# Patient Record
Sex: Female | Born: 1945 | Race: White | Hispanic: No | Marital: Single | Smoking: Never smoker
Health system: Southern US, Community
[De-identification: ages and names within clinical notes are randomized; demographics above are authoritative.]

## PROBLEM LIST (undated history)

## (undated) DIAGNOSIS — E559 Vitamin D deficiency, unspecified: Secondary | ICD-10-CM

## (undated) DIAGNOSIS — C50919 Malignant neoplasm of unspecified site of unspecified female breast: Secondary | ICD-10-CM

## (undated) DIAGNOSIS — I10 Essential (primary) hypertension: Secondary | ICD-10-CM

## (undated) DIAGNOSIS — G709 Myoneural disorder, unspecified: Secondary | ICD-10-CM

## (undated) DIAGNOSIS — D509 Iron deficiency anemia, unspecified: Secondary | ICD-10-CM

## (undated) DIAGNOSIS — T7840XA Allergy, unspecified, initial encounter: Secondary | ICD-10-CM

## (undated) DIAGNOSIS — Z9221 Personal history of antineoplastic chemotherapy: Secondary | ICD-10-CM

## (undated) DIAGNOSIS — Z923 Personal history of irradiation: Secondary | ICD-10-CM

## (undated) DIAGNOSIS — E785 Hyperlipidemia, unspecified: Secondary | ICD-10-CM

## (undated) DIAGNOSIS — M1990 Unspecified osteoarthritis, unspecified site: Secondary | ICD-10-CM

## (undated) DIAGNOSIS — Z8669 Personal history of other diseases of the nervous system and sense organs: Secondary | ICD-10-CM

## (undated) DIAGNOSIS — G459 Transient cerebral ischemic attack, unspecified: Secondary | ICD-10-CM

## (undated) DIAGNOSIS — C189 Malignant neoplasm of colon, unspecified: Secondary | ICD-10-CM

## (undated) DIAGNOSIS — Z86718 Personal history of other venous thrombosis and embolism: Secondary | ICD-10-CM

## (undated) HISTORY — DX: Personal history of antineoplastic chemotherapy: Z92.21

## (undated) HISTORY — DX: Allergy, unspecified, initial encounter: T78.40XA

## (undated) HISTORY — DX: Malignant neoplasm of colon, unspecified: C18.9

## (undated) HISTORY — DX: Myoneural disorder, unspecified: G70.9

## (undated) HISTORY — DX: Vitamin D deficiency, unspecified: E55.9

## (undated) HISTORY — DX: Malignant neoplasm of unspecified site of unspecified female breast: C50.919

## (undated) HISTORY — DX: Hyperlipidemia, unspecified: E78.5

## (undated) HISTORY — DX: Unspecified osteoarthritis, unspecified site: M19.90

## (undated) HISTORY — PX: INTRACAPSULAR CATARACT EXTRACTION: SHX361

## (undated) HISTORY — PX: BREAST EXCISIONAL BIOPSY: SUR124

## (undated) HISTORY — DX: Iron deficiency anemia, unspecified: D50.9

## (undated) HISTORY — DX: Essential (primary) hypertension: I10

## (undated) HISTORY — DX: Transient cerebral ischemic attack, unspecified: G45.9

## (undated) HISTORY — DX: Personal history of other venous thrombosis and embolism: Z86.718

## (undated) HISTORY — DX: Personal history of other diseases of the nervous system and sense organs: Z86.69

## (undated) HISTORY — PX: COLON SURGERY: SHX602

---

## 2005-04-05 LAB — CONVERTED CEMR LAB: Pap Smear: NORMAL

## 2008-09-05 ENCOUNTER — Ambulatory Visit: Payer: Self-pay | Admitting: Family Medicine

## 2008-09-05 DIAGNOSIS — J309 Allergic rhinitis, unspecified: Secondary | ICD-10-CM | POA: Insufficient documentation

## 2008-09-05 DIAGNOSIS — E6609 Other obesity due to excess calories: Secondary | ICD-10-CM | POA: Insufficient documentation

## 2008-09-05 DIAGNOSIS — M1990 Unspecified osteoarthritis, unspecified site: Secondary | ICD-10-CM | POA: Insufficient documentation

## 2008-09-05 DIAGNOSIS — M722 Plantar fascial fibromatosis: Secondary | ICD-10-CM | POA: Insufficient documentation

## 2008-09-05 DIAGNOSIS — D509 Iron deficiency anemia, unspecified: Secondary | ICD-10-CM | POA: Insufficient documentation

## 2008-09-06 ENCOUNTER — Ambulatory Visit: Payer: Self-pay | Admitting: Cardiovascular Disease

## 2008-09-10 ENCOUNTER — Encounter: Payer: Self-pay | Admitting: Family Medicine

## 2008-09-10 ENCOUNTER — Ambulatory Visit: Payer: Self-pay

## 2008-09-11 ENCOUNTER — Ambulatory Visit: Payer: Self-pay

## 2008-09-11 ENCOUNTER — Encounter: Payer: Self-pay | Admitting: Cardiovascular Disease

## 2008-09-16 ENCOUNTER — Ambulatory Visit: Payer: Self-pay | Admitting: Cardiovascular Disease

## 2008-10-08 ENCOUNTER — Encounter: Payer: Self-pay | Admitting: Family Medicine

## 2008-10-14 ENCOUNTER — Ambulatory Visit: Payer: Self-pay | Admitting: Family Medicine

## 2008-10-14 DIAGNOSIS — E7800 Pure hypercholesterolemia, unspecified: Secondary | ICD-10-CM

## 2008-10-14 DIAGNOSIS — E1169 Type 2 diabetes mellitus with other specified complication: Secondary | ICD-10-CM | POA: Insufficient documentation

## 2008-10-16 LAB — CONVERTED CEMR LAB
AST: 24 units/L (ref 0–37)
Albumin: 3.6 g/dL (ref 3.5–5.2)
BUN: 16 mg/dL (ref 6–23)
Cholesterol: 233 mg/dL (ref 0–200)
Creatinine, Ser: 0.8 mg/dL (ref 0.4–1.2)
Direct LDL: 143.9 mg/dL
GFR calc Af Amer: 93 mL/min
GFR calc non Af Amer: 77 mL/min
Glucose, Bld: 102 mg/dL — ABNORMAL HIGH (ref 70–99)
HDL: 22.2 mg/dL — ABNORMAL LOW (ref >39.0)
Total Protein: 8.6 g/dL — ABNORMAL HIGH (ref 6.0–8.3)
VLDL: 51 mg/dL — ABNORMAL HIGH (ref 0–40)

## 2008-10-17 ENCOUNTER — Encounter: Payer: Self-pay | Admitting: Family Medicine

## 2008-10-17 ENCOUNTER — Other Ambulatory Visit: Admission: RE | Admit: 2008-10-17 | Discharge: 2008-10-17 | Payer: Self-pay | Admitting: Family Medicine

## 2008-10-17 ENCOUNTER — Ambulatory Visit: Payer: Self-pay | Admitting: Family Medicine

## 2008-10-17 DIAGNOSIS — I152 Hypertension secondary to endocrine disorders: Secondary | ICD-10-CM | POA: Insufficient documentation

## 2008-10-17 DIAGNOSIS — I10 Essential (primary) hypertension: Secondary | ICD-10-CM

## 2008-10-17 DIAGNOSIS — E1159 Type 2 diabetes mellitus with other circulatory complications: Secondary | ICD-10-CM | POA: Insufficient documentation

## 2008-10-21 ENCOUNTER — Encounter (INDEPENDENT_AMBULATORY_CARE_PROVIDER_SITE_OTHER): Payer: Self-pay | Admitting: *Deleted

## 2008-11-20 ENCOUNTER — Encounter: Payer: Self-pay | Admitting: Family Medicine

## 2008-12-02 ENCOUNTER — Encounter (INDEPENDENT_AMBULATORY_CARE_PROVIDER_SITE_OTHER): Payer: Self-pay | Admitting: *Deleted

## 2008-12-04 ENCOUNTER — Telehealth: Payer: Self-pay | Admitting: Family Medicine

## 2008-12-06 DIAGNOSIS — C50919 Malignant neoplasm of unspecified site of unspecified female breast: Secondary | ICD-10-CM

## 2008-12-06 DIAGNOSIS — Z86718 Personal history of other venous thrombosis and embolism: Secondary | ICD-10-CM

## 2008-12-06 HISTORY — DX: Personal history of other venous thrombosis and embolism: Z86.718

## 2008-12-06 HISTORY — DX: Malignant neoplasm of unspecified site of unspecified female breast: C50.919

## 2008-12-06 HISTORY — PX: BREAST LUMPECTOMY: SHX2

## 2008-12-06 HISTORY — PX: BREAST EXCISIONAL BIOPSY: SUR124

## 2008-12-16 ENCOUNTER — Encounter (INDEPENDENT_AMBULATORY_CARE_PROVIDER_SITE_OTHER): Payer: Self-pay | Admitting: Diagnostic Radiology

## 2008-12-16 ENCOUNTER — Encounter: Admission: RE | Admit: 2008-12-16 | Discharge: 2008-12-16 | Payer: Self-pay | Admitting: Family Medicine

## 2008-12-17 ENCOUNTER — Telehealth: Payer: Self-pay | Admitting: Family Medicine

## 2008-12-17 DIAGNOSIS — Z86000 Personal history of in-situ neoplasm of breast: Secondary | ICD-10-CM | POA: Insufficient documentation

## 2008-12-20 ENCOUNTER — Encounter: Admission: RE | Admit: 2008-12-20 | Discharge: 2008-12-20 | Payer: Self-pay | Admitting: General Surgery

## 2008-12-23 ENCOUNTER — Encounter: Admission: RE | Admit: 2008-12-23 | Discharge: 2008-12-23 | Payer: Self-pay | Admitting: Family Medicine

## 2008-12-25 ENCOUNTER — Encounter: Admission: RE | Admit: 2008-12-25 | Discharge: 2008-12-25 | Payer: Self-pay | Admitting: General Surgery

## 2008-12-25 ENCOUNTER — Encounter: Payer: Self-pay | Admitting: Family Medicine

## 2008-12-25 ENCOUNTER — Other Ambulatory Visit: Admission: RE | Admit: 2008-12-25 | Discharge: 2008-12-25 | Payer: Self-pay | Admitting: General Surgery

## 2009-01-14 ENCOUNTER — Ambulatory Visit: Payer: Self-pay | Admitting: Oncology

## 2009-01-15 ENCOUNTER — Ambulatory Visit: Admission: RE | Admit: 2009-01-15 | Discharge: 2009-04-11 | Payer: Self-pay | Admitting: Radiation Oncology

## 2009-01-16 ENCOUNTER — Ambulatory Visit: Payer: Self-pay | Admitting: Family Medicine

## 2009-01-21 ENCOUNTER — Ambulatory Visit: Payer: Self-pay | Admitting: Family Medicine

## 2009-01-21 LAB — CONVERTED CEMR LAB
ALT: 39 units/L — ABNORMAL HIGH (ref 0–35)
AST: 31 units/L (ref 0–37)
Alkaline Phosphatase: 99 units/L (ref 39–117)
Bilirubin, Direct: 0.1 mg/dL (ref 0.0–0.3)
CO2: 29 meq/L (ref 19–32)
Calcium: 9.6 mg/dL (ref 8.4–10.5)
Chloride: 103 meq/L (ref 96–112)
Cholesterol: 234 mg/dL (ref 0–200)
Glucose, Bld: 106 mg/dL — ABNORMAL HIGH (ref 70–99)
Sodium: 139 meq/L (ref 135–145)
Total Bilirubin: 0.8 mg/dL (ref 0.3–1.2)
Total CHOL/HDL Ratio: 9.5
Total Protein: 8.3 g/dL (ref 6.0–8.3)
Triglycerides: 355 mg/dL (ref 0–149)

## 2009-01-24 ENCOUNTER — Ambulatory Visit: Payer: Self-pay | Admitting: Oncology

## 2009-01-27 ENCOUNTER — Encounter: Payer: Self-pay | Admitting: Family Medicine

## 2009-01-27 LAB — CMP (CANCER CENTER ONLY)
ALT(SGPT): 56 U/L — ABNORMAL HIGH (ref 10–47)
AST: 51 U/L — ABNORMAL HIGH (ref 11–38)
Albumin: 4 g/dL (ref 3.3–5.5)
Alkaline Phosphatase: 99 U/L — ABNORMAL HIGH (ref 26–84)
Glucose, Bld: 113 mg/dL (ref 73–118)
Potassium: 4.6 mEq/L (ref 3.3–4.7)
Sodium: 141 mEq/L (ref 128–145)
Total Bilirubin: 0.8 mg/dl (ref 0.20–1.60)
Total Protein: 9.6 g/dL — ABNORMAL HIGH (ref 6.4–8.1)

## 2009-01-27 LAB — CBC WITH DIFFERENTIAL (CANCER CENTER ONLY)
BASO#: 0.1 10*3/uL (ref 0.0–0.2)
BASO%: 1.2 % (ref 0.0–2.0)
EOS%: 2.1 % (ref 0.0–7.0)
HCT: 43.4 % (ref 34.8–46.6)
HGB: 14.7 g/dL (ref 11.6–15.9)
LYMPH%: 21.1 % (ref 14.0–48.0)
MCHC: 33.8 g/dL (ref 32.0–36.0)
MCV: 94 fL (ref 81–101)
MONO#: 0.6 10*3/uL (ref 0.1–0.9)
NEUT%: 69.2 % (ref 39.6–80.0)
RDW: 10.5 % (ref 10.5–14.6)

## 2009-03-10 ENCOUNTER — Emergency Department (HOSPITAL_COMMUNITY): Admission: EM | Admit: 2009-03-10 | Discharge: 2009-03-10 | Payer: Self-pay | Admitting: Emergency Medicine

## 2009-03-10 ENCOUNTER — Encounter: Payer: Self-pay | Admitting: Family Medicine

## 2009-04-03 ENCOUNTER — Ambulatory Visit: Payer: Self-pay | Admitting: Oncology

## 2009-04-08 ENCOUNTER — Encounter: Payer: Self-pay | Admitting: Family Medicine

## 2009-04-22 ENCOUNTER — Ambulatory Visit: Payer: Self-pay | Admitting: Family Medicine

## 2009-04-22 DIAGNOSIS — E118 Type 2 diabetes mellitus with unspecified complications: Secondary | ICD-10-CM | POA: Insufficient documentation

## 2009-04-22 DIAGNOSIS — E1159 Type 2 diabetes mellitus with other circulatory complications: Secondary | ICD-10-CM | POA: Insufficient documentation

## 2009-04-22 DIAGNOSIS — E119 Type 2 diabetes mellitus without complications: Secondary | ICD-10-CM | POA: Insufficient documentation

## 2009-04-30 LAB — CONVERTED CEMR LAB
Cholesterol: 200 mg/dL (ref 0–200)
Direct LDL: 138.9 mg/dL
Total CHOL/HDL Ratio: 9
VLDL: 43.6 mg/dL — ABNORMAL HIGH (ref 0.0–40.0)

## 2009-05-27 ENCOUNTER — Ambulatory Visit: Payer: Self-pay | Admitting: Oncology

## 2009-05-28 ENCOUNTER — Encounter: Payer: Self-pay | Admitting: Family Medicine

## 2009-05-28 LAB — CMP (CANCER CENTER ONLY)
ALT(SGPT): 29 U/L (ref 10–47)
AST: 25 U/L (ref 11–38)
Albumin: 3.2 g/dL — ABNORMAL LOW (ref 3.3–5.5)
Alkaline Phosphatase: 67 U/L (ref 26–84)
Calcium: 10.1 mg/dL (ref 8.0–10.3)
Chloride: 104 mEq/L (ref 98–108)
Potassium: 3.7 mEq/L (ref 3.3–4.7)
Sodium: 138 mEq/L (ref 128–145)

## 2009-05-28 LAB — CBC WITH DIFFERENTIAL (CANCER CENTER ONLY)
Eosinophils Absolute: 0.1 10*3/uL (ref 0.0–0.5)
HCT: 39.9 % (ref 34.8–46.6)
HGB: 14 g/dL (ref 11.6–15.9)
LYMPH%: 19.7 % (ref 14.0–48.0)
MCV: 94 fL (ref 81–101)
MONO#: 0.4 10*3/uL (ref 0.1–0.9)
NEUT%: 71 % (ref 39.6–80.0)
RBC: 4.26 10*6/uL (ref 3.70–5.32)
WBC: 5.3 10*3/uL (ref 3.9–10.0)

## 2009-06-02 ENCOUNTER — Ambulatory Visit: Payer: Self-pay | Admitting: Hematology

## 2009-06-02 ENCOUNTER — Ambulatory Visit: Admission: RE | Admit: 2009-06-02 | Discharge: 2009-06-02 | Payer: Self-pay | Admitting: Radiation Oncology

## 2009-06-05 HISTORY — PX: LOBECTOMY: SHX5089

## 2009-06-18 ENCOUNTER — Ambulatory Visit (HOSPITAL_COMMUNITY): Admission: RE | Admit: 2009-06-18 | Discharge: 2009-06-18 | Payer: Self-pay | Admitting: Radiation Oncology

## 2009-06-26 ENCOUNTER — Ambulatory Visit: Payer: Self-pay | Admitting: Thoracic Surgery (Cardiothoracic Vascular Surgery)

## 2009-06-26 ENCOUNTER — Encounter: Payer: Self-pay | Admitting: Cardiovascular Disease

## 2009-07-01 ENCOUNTER — Inpatient Hospital Stay (HOSPITAL_COMMUNITY)
Admission: RE | Admit: 2009-07-01 | Discharge: 2009-07-05 | Payer: Self-pay | Admitting: Thoracic Surgery (Cardiothoracic Vascular Surgery)

## 2009-07-01 ENCOUNTER — Encounter: Payer: Self-pay | Admitting: Thoracic Surgery (Cardiothoracic Vascular Surgery)

## 2009-07-01 ENCOUNTER — Ambulatory Visit: Payer: Self-pay | Admitting: Thoracic Surgery (Cardiothoracic Vascular Surgery)

## 2009-07-17 ENCOUNTER — Ambulatory Visit: Payer: Self-pay | Admitting: Oncology

## 2009-07-22 ENCOUNTER — Encounter: Payer: Self-pay | Admitting: Family Medicine

## 2009-07-22 LAB — CMP (CANCER CENTER ONLY)
AST: 23 U/L (ref 11–38)
BUN, Bld: 18 mg/dL (ref 7–22)
CO2: 28 mEq/L (ref 18–33)
Calcium: 9.6 mg/dL (ref 8.0–10.3)
Chloride: 100 mEq/L (ref 98–108)
Creat: 0.6 mg/dl (ref 0.6–1.2)
Glucose, Bld: 116 mg/dL (ref 73–118)

## 2009-07-22 LAB — CBC WITH DIFFERENTIAL (CANCER CENTER ONLY)
Eosinophils Absolute: 0.2 10*3/uL (ref 0.0–0.5)
MCH: 32.6 pg (ref 26.0–34.0)
MONO%: 8.6 % (ref 0.0–13.0)
NEUT#: 4.6 10*3/uL (ref 1.5–6.5)
Platelets: 320 10*3/uL (ref 145–400)
RBC: 4.08 10*6/uL (ref 3.70–5.32)
WBC: 6.6 10*3/uL (ref 3.9–10.0)

## 2009-07-25 ENCOUNTER — Telehealth: Payer: Self-pay | Admitting: Family Medicine

## 2009-07-30 ENCOUNTER — Ambulatory Visit (HOSPITAL_COMMUNITY): Admission: RE | Admit: 2009-07-30 | Discharge: 2009-07-30 | Payer: Self-pay | Admitting: Oncology

## 2009-07-31 ENCOUNTER — Ambulatory Visit: Payer: Self-pay | Admitting: Family Medicine

## 2009-08-01 ENCOUNTER — Ambulatory Visit: Payer: Self-pay | Admitting: Thoracic Surgery (Cardiothoracic Vascular Surgery)

## 2009-08-01 ENCOUNTER — Encounter
Admission: RE | Admit: 2009-08-01 | Discharge: 2009-08-01 | Payer: Self-pay | Admitting: Thoracic Surgery (Cardiothoracic Vascular Surgery)

## 2009-08-04 LAB — CONVERTED CEMR LAB
ALT: 23 units/L (ref 0–35)
Cholesterol: 202 mg/dL — ABNORMAL HIGH (ref 0–200)
HDL: 26 mg/dL — ABNORMAL LOW (ref >39.00)
Triglycerides: 316 mg/dL — ABNORMAL HIGH (ref 0.0–149.0)

## 2009-08-05 ENCOUNTER — Encounter: Payer: Self-pay | Admitting: Family Medicine

## 2009-08-05 LAB — CBC WITH DIFFERENTIAL (CANCER CENTER ONLY)
Eosinophils Absolute: 0.2 10*3/uL (ref 0.0–0.5)
LYMPH#: 1.1 10*3/uL (ref 0.9–3.3)
LYMPH%: 19.6 % (ref 14.0–48.0)
MCV: 97 fL (ref 81–101)
MONO#: 0.5 10*3/uL (ref 0.1–0.9)
NEUT#: 3.7 10*3/uL (ref 1.5–6.5)
Platelets: 243 10*3/uL (ref 145–400)
RBC: 4.06 10*6/uL (ref 3.70–5.32)
WBC: 5.4 10*3/uL (ref 3.9–10.0)

## 2009-08-05 LAB — CEA: CEA: 1 ng/mL (ref 0.0–5.0)

## 2009-08-12 ENCOUNTER — Ambulatory Visit (HOSPITAL_COMMUNITY)
Admission: RE | Admit: 2009-08-12 | Discharge: 2009-08-12 | Payer: Self-pay | Admitting: Thoracic Surgery (Cardiothoracic Vascular Surgery)

## 2009-08-12 ENCOUNTER — Ambulatory Visit: Payer: Self-pay | Admitting: Thoracic Surgery (Cardiothoracic Vascular Surgery)

## 2009-08-13 ENCOUNTER — Ambulatory Visit: Payer: Self-pay | Admitting: Oncology

## 2009-08-19 ENCOUNTER — Encounter: Payer: Self-pay | Admitting: Family Medicine

## 2009-08-19 LAB — CMP (CANCER CENTER ONLY)
ALT(SGPT): 22 U/L (ref 10–47)
AST: 24 U/L (ref 11–38)
Albumin: 3.3 g/dL (ref 3.3–5.5)
Calcium: 9.9 mg/dL (ref 8.0–10.3)
Chloride: 97 mEq/L — ABNORMAL LOW (ref 98–108)
Creat: 0.8 mg/dl (ref 0.6–1.2)
Potassium: 3.9 mEq/L (ref 3.3–4.7)

## 2009-08-19 LAB — CBC WITH DIFFERENTIAL (CANCER CENTER ONLY)
BASO#: 0.1 10*3/uL (ref 0.0–0.2)
EOS%: 1.2 % (ref 0.0–7.0)
HCT: 39.9 % (ref 34.8–46.6)
HGB: 13.4 g/dL (ref 11.6–15.9)
LYMPH#: 0.9 10*3/uL (ref 0.9–3.3)
MCHC: 33.6 g/dL (ref 32.0–36.0)
MCV: 98 fL (ref 81–101)
MONO#: 0.9 10*3/uL (ref 0.1–0.9)
NEUT%: 81.3 % — ABNORMAL HIGH (ref 39.6–80.0)

## 2009-08-27 LAB — CBC WITH DIFFERENTIAL (CANCER CENTER ONLY)
BASO#: 0.1 10*3/uL (ref 0.0–0.2)
EOS%: 4.4 % (ref 0.0–7.0)
HCT: 37.4 % (ref 34.8–46.6)
HGB: 12.9 g/dL (ref 11.6–15.9)
LYMPH%: 10.4 % — ABNORMAL LOW (ref 14.0–48.0)
MCH: 33.1 pg (ref 26.0–34.0)
MCHC: 34.5 g/dL (ref 32.0–36.0)
MONO%: 4.7 % (ref 0.0–13.0)
NEUT%: 79.8 % (ref 39.6–80.0)

## 2009-08-27 LAB — BASIC METABOLIC PANEL - CANCER CENTER ONLY
BUN, Bld: 19 mg/dL (ref 7–22)
Chloride: 99 mEq/L (ref 98–108)
Creat: 0.7 mg/dl (ref 0.6–1.2)

## 2009-09-02 ENCOUNTER — Encounter: Payer: Self-pay | Admitting: Family Medicine

## 2009-09-02 LAB — CMP (CANCER CENTER ONLY)
AST: 31 U/L (ref 11–38)
Albumin: 3.1 g/dL — ABNORMAL LOW (ref 3.3–5.5)
Alkaline Phosphatase: 78 U/L (ref 26–84)
BUN, Bld: 13 mg/dL (ref 7–22)
Potassium: 3.5 mEq/L (ref 3.3–4.7)
Total Bilirubin: 0.4 mg/dl (ref 0.20–1.60)

## 2009-09-02 LAB — CBC WITH DIFFERENTIAL (CANCER CENTER ONLY)
BASO#: 0 10*3/uL (ref 0.0–0.2)
EOS%: 3.5 % (ref 0.0–7.0)
MCH: 33.8 pg (ref 26.0–34.0)
MCHC: 34.7 g/dL (ref 32.0–36.0)
MONO%: 9.3 % (ref 0.0–13.0)
NEUT#: 2.8 10*3/uL (ref 1.5–6.5)
Platelets: 195 10*3/uL (ref 145–400)
RDW: 11 % (ref 10.5–14.6)

## 2009-09-08 ENCOUNTER — Encounter: Payer: Self-pay | Admitting: Family Medicine

## 2009-09-08 LAB — CBC WITH DIFFERENTIAL (CANCER CENTER ONLY)
BASO#: 0 10*3/uL (ref 0.0–0.2)
EOS%: 7.9 % — ABNORMAL HIGH (ref 0.0–7.0)
HCT: 39.4 % (ref 34.8–46.6)
HGB: 13.5 g/dL (ref 11.6–15.9)
LYMPH#: 1.1 10*3/uL (ref 0.9–3.3)
MCHC: 34.3 g/dL (ref 32.0–36.0)
NEUT%: 54.1 % (ref 39.6–80.0)

## 2009-09-08 LAB — BASIC METABOLIC PANEL - CANCER CENTER ONLY
BUN, Bld: 15 mg/dL (ref 7–22)
Creat: 1 mg/dl (ref 0.6–1.2)

## 2009-09-09 ENCOUNTER — Ambulatory Visit: Payer: Self-pay

## 2009-09-09 ENCOUNTER — Encounter: Payer: Self-pay | Admitting: Cardiovascular Disease

## 2009-09-09 ENCOUNTER — Ambulatory Visit: Payer: Self-pay | Admitting: Cardiovascular Disease

## 2009-09-12 ENCOUNTER — Ambulatory Visit: Payer: Self-pay | Admitting: Oncology

## 2009-09-16 ENCOUNTER — Encounter: Payer: Self-pay | Admitting: Family Medicine

## 2009-09-16 LAB — CBC WITH DIFFERENTIAL (CANCER CENTER ONLY)
BASO%: 0.8 % (ref 0.0–2.0)
EOS%: 1.7 % (ref 0.0–7.0)
LYMPH%: 20.8 % (ref 14.0–48.0)
MCH: 33.4 pg (ref 26.0–34.0)
MCV: 98 fL (ref 81–101)
MONO%: 9.7 % (ref 0.0–13.0)
NEUT#: 3.3 10*3/uL (ref 1.5–6.5)
Platelets: 207 10*3/uL (ref 145–400)
RBC: 3.86 10*6/uL (ref 3.70–5.32)
RDW: 11.3 % (ref 10.5–14.6)

## 2009-09-16 LAB — CMP (CANCER CENTER ONLY)
AST: 35 U/L (ref 11–38)
Albumin: 3 g/dL — ABNORMAL LOW (ref 3.3–5.5)
Alkaline Phosphatase: 78 U/L (ref 26–84)
Potassium: 3.5 mEq/L (ref 3.3–4.7)
Sodium: 139 mEq/L (ref 128–145)
Total Protein: 8 g/dL (ref 6.4–8.1)

## 2009-09-22 ENCOUNTER — Encounter: Payer: Self-pay | Admitting: Family Medicine

## 2009-09-22 LAB — CBC WITH DIFFERENTIAL (CANCER CENTER ONLY)
BASO%: 0.8 % (ref 0.0–2.0)
Eosinophils Absolute: 0.2 10*3/uL (ref 0.0–0.5)
LYMPH#: 1.3 10*3/uL (ref 0.9–3.3)
LYMPH%: 34.2 % (ref 14.0–48.0)
MONO#: 0.3 10*3/uL (ref 0.1–0.9)
NEUT#: 2 10*3/uL (ref 1.5–6.5)
Platelets: 234 10*3/uL (ref 145–400)
RBC: 3.95 10*6/uL (ref 3.70–5.32)
RDW: 11.8 % (ref 10.5–14.6)
WBC: 3.9 10*3/uL (ref 3.9–10.0)

## 2009-09-22 LAB — BASIC METABOLIC PANEL - CANCER CENTER ONLY
BUN, Bld: 21 mg/dL (ref 7–22)
Chloride: 99 mEq/L (ref 98–108)
Creat: 0.9 mg/dl (ref 0.6–1.2)
Glucose, Bld: 159 mg/dL — ABNORMAL HIGH (ref 73–118)

## 2009-09-23 ENCOUNTER — Encounter: Payer: Self-pay | Admitting: Cardiovascular Disease

## 2009-09-30 ENCOUNTER — Encounter: Payer: Self-pay | Admitting: Family Medicine

## 2009-09-30 LAB — CMP (CANCER CENTER ONLY)
AST: 30 U/L (ref 11–38)
Albumin: 2.7 g/dL — ABNORMAL LOW (ref 3.3–5.5)
Alkaline Phosphatase: 94 U/L — ABNORMAL HIGH (ref 26–84)
BUN, Bld: 18 mg/dL (ref 7–22)
Potassium: 3.5 mEq/L (ref 3.3–4.7)
Sodium: 136 mEq/L (ref 128–145)
Total Bilirubin: 0.5 mg/dl (ref 0.20–1.60)

## 2009-09-30 LAB — CBC WITH DIFFERENTIAL (CANCER CENTER ONLY)
BASO%: 0.6 % (ref 0.0–2.0)
Eosinophils Absolute: 0.2 10*3/uL (ref 0.0–0.5)
HCT: 33.6 % — ABNORMAL LOW (ref 34.8–46.6)
HGB: 11.8 g/dL (ref 11.6–15.9)
LYMPH#: 0.9 10*3/uL (ref 0.9–3.3)
MONO#: 0.5 10*3/uL (ref 0.1–0.9)
NEUT%: 61.7 % (ref 39.6–80.0)
RBC: 3.43 10*6/uL — ABNORMAL LOW (ref 3.70–5.32)
RDW: 12.5 % (ref 10.5–14.6)
WBC: 4.4 10*3/uL (ref 3.9–10.0)

## 2009-10-07 ENCOUNTER — Encounter: Payer: Self-pay | Admitting: Family Medicine

## 2009-10-07 LAB — MANUAL DIFFERENTIAL (CHCC SATELLITE)
ALC: 1.1 10*3/uL (ref 0.6–2.2)
Band Neutrophils: 2 % (ref 0–10)
LYMPH: 15 % (ref 14–48)
MONO: 4 % (ref 0–13)
PLT EST ~~LOC~~: ADEQUATE
SEG: 75 % (ref 40–75)

## 2009-10-07 LAB — CBC WITH DIFFERENTIAL (CANCER CENTER ONLY)
HCT: 37.4 % (ref 34.8–46.6)
HGB: 12.5 g/dL (ref 11.6–15.9)
MCH: 33.4 pg (ref 26.0–34.0)
MCHC: 33.6 g/dL (ref 32.0–36.0)
MCV: 100 fL (ref 81–101)
RDW: 13.5 % (ref 10.5–14.6)

## 2009-10-07 LAB — BASIC METABOLIC PANEL - CANCER CENTER ONLY
Chloride: 101 mEq/L (ref 98–108)
Creat: 1 mg/dl (ref 0.6–1.2)
Potassium: 3.7 mEq/L (ref 3.3–4.7)
Sodium: 138 mEq/L (ref 128–145)

## 2009-10-10 ENCOUNTER — Ambulatory Visit: Payer: Self-pay | Admitting: Oncology

## 2009-10-14 ENCOUNTER — Encounter: Payer: Self-pay | Admitting: Family Medicine

## 2009-10-14 LAB — CBC WITH DIFFERENTIAL (CANCER CENTER ONLY)
EOS%: 4.2 % (ref 0.0–7.0)
Eosinophils Absolute: 0.1 10*3/uL (ref 0.0–0.5)
LYMPH%: 21.7 % (ref 14.0–48.0)
MCH: 34.9 pg — ABNORMAL HIGH (ref 26.0–34.0)
MCHC: 34.3 g/dL (ref 32.0–36.0)
MCV: 102 fL — ABNORMAL HIGH (ref 81–101)
MONO%: 13.3 % — ABNORMAL HIGH (ref 0.0–13.0)
Platelets: 187 10*3/uL (ref 145–400)
RBC: 3.35 10*6/uL — ABNORMAL LOW (ref 3.70–5.32)

## 2009-10-14 LAB — CMP (CANCER CENTER ONLY)
AST: 31 U/L (ref 11–38)
Albumin: 3 g/dL — ABNORMAL LOW (ref 3.3–5.5)
BUN, Bld: 18 mg/dL (ref 7–22)
Calcium: 9.6 mg/dL (ref 8.0–10.3)
Chloride: 103 mEq/L (ref 98–108)
Potassium: 3.6 mEq/L (ref 3.3–4.7)

## 2009-10-21 LAB — BASIC METABOLIC PANEL - CANCER CENTER ONLY
Calcium: 9.1 mg/dL (ref 8.0–10.3)
Chloride: 101 mEq/L (ref 98–108)
Creat: 0.8 mg/dl (ref 0.6–1.2)

## 2009-10-21 LAB — CBC WITH DIFFERENTIAL (CANCER CENTER ONLY)
BASO%: 0.5 % (ref 0.0–2.0)
LYMPH#: 1 10*3/uL (ref 0.9–3.3)
MONO#: 0.6 10*3/uL (ref 0.1–0.9)
NEUT#: 5 10*3/uL (ref 1.5–6.5)
Platelets: 199 10*3/uL (ref 145–400)
RDW: 14.9 % — ABNORMAL HIGH (ref 10.5–14.6)
WBC: 6.8 10*3/uL (ref 3.9–10.0)

## 2009-11-04 ENCOUNTER — Encounter: Payer: Self-pay | Admitting: Family Medicine

## 2009-11-04 LAB — CMP (CANCER CENTER ONLY)
ALT(SGPT): 28 U/L (ref 10–47)
AST: 32 U/L (ref 11–38)
Albumin: 2.8 g/dL — ABNORMAL LOW (ref 3.3–5.5)
Calcium: 9.1 mg/dL (ref 8.0–10.3)
Chloride: 101 mEq/L (ref 98–108)
Potassium: 3.4 mEq/L (ref 3.3–4.7)
Total Protein: 7.7 g/dL (ref 6.4–8.1)

## 2009-11-04 LAB — CBC WITH DIFFERENTIAL (CANCER CENTER ONLY)
BASO#: 0.1 10*3/uL (ref 0.0–0.2)
BASO%: 0.8 % (ref 0.0–2.0)
HCT: 32.7 % — ABNORMAL LOW (ref 34.8–46.6)
HGB: 11.1 g/dL — ABNORMAL LOW (ref 11.6–15.9)
LYMPH#: 1 10*3/uL (ref 0.9–3.3)
MONO#: 0.7 10*3/uL (ref 0.1–0.9)
NEUT%: 69.7 % (ref 39.6–80.0)
RBC: 3.05 10*6/uL — ABNORMAL LOW (ref 3.70–5.32)
RDW: 15.8 % — ABNORMAL HIGH (ref 10.5–14.6)
WBC: 6.5 10*3/uL (ref 3.9–10.0)

## 2009-11-05 ENCOUNTER — Ambulatory Visit: Payer: Self-pay

## 2009-11-05 ENCOUNTER — Encounter: Payer: Self-pay | Admitting: Family Medicine

## 2009-11-05 ENCOUNTER — Encounter: Payer: Self-pay | Admitting: Cardiovascular Disease

## 2009-11-05 ENCOUNTER — Ambulatory Visit: Payer: Self-pay | Admitting: Oncology

## 2009-11-05 DIAGNOSIS — I8000 Phlebitis and thrombophlebitis of superficial vessels of unspecified lower extremity: Secondary | ICD-10-CM | POA: Insufficient documentation

## 2009-11-05 LAB — PROTIME-INR (CHCC SATELLITE)
INR: 0.9 — ABNORMAL LOW (ref 2.0–3.5)
Protime: 10.8 Seconds (ref 10.6–13.4)

## 2009-11-05 LAB — CBC WITH DIFFERENTIAL (CANCER CENTER ONLY)
BASO%: 0.8 % (ref 0.0–2.0)
Eosinophils Absolute: 0.2 10*3/uL (ref 0.0–0.5)
LYMPH%: 11.7 % — ABNORMAL LOW (ref 14.0–48.0)
MONO#: 1 10*3/uL — ABNORMAL HIGH (ref 0.1–0.9)
MONO%: 10.8 % (ref 0.0–13.0)
NEUT#: 7.2 10*3/uL — ABNORMAL HIGH (ref 1.5–6.5)
Platelets: 248 10*3/uL (ref 145–400)
RBC: 3.07 10*6/uL — ABNORMAL LOW (ref 3.70–5.32)
RDW: 15.3 % — ABNORMAL HIGH (ref 10.5–14.6)
WBC: 9.6 10*3/uL (ref 3.9–10.0)

## 2009-11-11 ENCOUNTER — Encounter: Payer: Self-pay | Admitting: Family Medicine

## 2009-11-11 LAB — BASIC METABOLIC PANEL - CANCER CENTER ONLY
BUN, Bld: 20 mg/dL (ref 7–22)
CO2: 28 mEq/L (ref 18–33)
Chloride: 102 mEq/L (ref 98–108)
Creat: 0.7 mg/dl (ref 0.6–1.2)
Glucose, Bld: 106 mg/dL (ref 73–118)
Potassium: 3.9 mEq/L (ref 3.3–4.7)

## 2009-11-11 LAB — PROTIME-INR (CHCC SATELLITE): INR: 1.4 — ABNORMAL LOW (ref 2.0–3.5)

## 2009-11-11 LAB — CBC WITH DIFFERENTIAL (CANCER CENTER ONLY)
EOS%: 1.4 % (ref 0.0–7.0)
LYMPH%: 10.7 % — ABNORMAL LOW (ref 14.0–48.0)
MCH: 36.4 pg — ABNORMAL HIGH (ref 26.0–34.0)
MCHC: 34 g/dL (ref 32.0–36.0)
MCV: 107 fL — ABNORMAL HIGH (ref 81–101)
MONO%: 10 % (ref 0.0–13.0)
NEUT#: 6.3 10*3/uL (ref 1.5–6.5)
Platelets: 318 10*3/uL (ref 145–400)
RDW: 14.1 % (ref 10.5–14.6)

## 2009-11-18 LAB — CMP (CANCER CENTER ONLY)
AST: 36 U/L (ref 11–38)
Albumin: 2.8 g/dL — ABNORMAL LOW (ref 3.3–5.5)
BUN, Bld: 27 mg/dL — ABNORMAL HIGH (ref 7–22)
CO2: 25 mEq/L (ref 18–33)
Calcium: 8.9 mg/dL (ref 8.0–10.3)
Chloride: 100 mEq/L (ref 98–108)
Creat: 0.9 mg/dl (ref 0.6–1.2)
Glucose, Bld: 174 mg/dL — ABNORMAL HIGH (ref 73–118)
Potassium: 3.8 mEq/L (ref 3.3–4.7)

## 2009-11-18 LAB — CBC WITH DIFFERENTIAL (CANCER CENTER ONLY)
BASO%: 0.5 % (ref 0.0–2.0)
Eosinophils Absolute: 0.2 10*3/uL (ref 0.0–0.5)
HCT: 32.5 % — ABNORMAL LOW (ref 34.8–46.6)
LYMPH#: 1 10*3/uL (ref 0.9–3.3)
LYMPH%: 13.3 % — ABNORMAL LOW (ref 14.0–48.0)
MCV: 105 fL — ABNORMAL HIGH (ref 81–101)
MONO#: 0.5 10*3/uL (ref 0.1–0.9)
Platelets: 370 10*3/uL (ref 145–400)
RBC: 3.1 10*6/uL — ABNORMAL LOW (ref 3.70–5.32)
RDW: 14.2 % (ref 10.5–14.6)
WBC: 7.4 10*3/uL (ref 3.9–10.0)

## 2009-11-18 LAB — PROTIME-INR (CHCC SATELLITE)

## 2009-11-20 LAB — PROTIME-INR (CHCC SATELLITE): INR: 1.7 — ABNORMAL LOW (ref 2.0–3.5)

## 2009-11-24 ENCOUNTER — Encounter: Admission: RE | Admit: 2009-11-24 | Discharge: 2009-11-24 | Payer: Self-pay | Admitting: Radiation Oncology

## 2009-11-24 LAB — PROTHROMBIN TIME: Prothrombin Time: 28.4 seconds — ABNORMAL HIGH (ref 10.2–14.0)

## 2009-11-25 ENCOUNTER — Encounter (INDEPENDENT_AMBULATORY_CARE_PROVIDER_SITE_OTHER): Payer: Self-pay | Admitting: *Deleted

## 2009-11-25 ENCOUNTER — Encounter: Payer: Self-pay | Admitting: Family Medicine

## 2009-11-25 LAB — CBC WITH DIFFERENTIAL (CANCER CENTER ONLY)
BASO#: 0 10*3/uL (ref 0.0–0.2)
BASO%: 0.6 % (ref 0.0–2.0)
Eosinophils Absolute: 0.2 10*3/uL (ref 0.0–0.5)
HCT: 32.8 % — ABNORMAL LOW (ref 34.8–46.6)
HGB: 11 g/dL — ABNORMAL LOW (ref 11.6–15.9)
LYMPH#: 0.9 10*3/uL (ref 0.9–3.3)
LYMPH%: 20.6 % (ref 14.0–48.0)
MCV: 107 fL — ABNORMAL HIGH (ref 81–101)
MONO#: 0.5 10*3/uL (ref 0.1–0.9)
NEUT%: 62.8 % (ref 39.6–80.0)
RBC: 3.07 10*6/uL — ABNORMAL LOW (ref 3.70–5.32)
WBC: 4.4 10*3/uL (ref 3.9–10.0)

## 2009-11-25 LAB — CMP (CANCER CENTER ONLY)
ALT(SGPT): 35 U/L (ref 10–47)
AST: 29 U/L (ref 11–38)
Albumin: 3 g/dL — ABNORMAL LOW (ref 3.3–5.5)
BUN, Bld: 19 mg/dL (ref 7–22)
CO2: 26 mEq/L (ref 18–33)
Calcium: 9.7 mg/dL (ref 8.0–10.3)
Chloride: 103 mEq/L (ref 98–108)
Potassium: 3.3 mEq/L (ref 3.3–4.7)

## 2009-11-25 LAB — PROTIME-INR (CHCC SATELLITE): Protime: 31.2 Seconds — ABNORMAL HIGH (ref 10.6–13.4)

## 2009-12-02 LAB — CBC WITH DIFFERENTIAL (CANCER CENTER ONLY)
BASO%: 0.4 % (ref 0.0–2.0)
LYMPH%: 19.4 % (ref 14.0–48.0)
MCH: 36.1 pg — ABNORMAL HIGH (ref 26.0–34.0)
MCV: 106 fL — ABNORMAL HIGH (ref 81–101)
MONO#: 0.5 10*3/uL (ref 0.1–0.9)
MONO%: 11.1 % (ref 0.0–13.0)
NEUT#: 2.9 10*3/uL (ref 1.5–6.5)
Platelets: 224 10*3/uL (ref 145–400)
RDW: 13.7 % (ref 10.5–14.6)
WBC: 4.4 10*3/uL (ref 3.9–10.0)

## 2009-12-02 LAB — CMP (CANCER CENTER ONLY)
Alkaline Phosphatase: 91 U/L — ABNORMAL HIGH (ref 26–84)
CO2: 27 mEq/L (ref 18–33)
Creat: 0.8 mg/dl (ref 0.6–1.2)
Glucose, Bld: 113 mg/dL (ref 73–118)
Total Bilirubin: 0.6 mg/dl (ref 0.20–1.60)

## 2009-12-02 LAB — PROTIME-INR (CHCC SATELLITE)
INR: 3.9 — ABNORMAL HIGH (ref 2.0–3.5)
Protime: 46.8 Seconds — ABNORMAL HIGH (ref 10.6–13.4)

## 2009-12-04 ENCOUNTER — Ambulatory Visit: Payer: Self-pay | Admitting: Oncology

## 2009-12-09 ENCOUNTER — Encounter: Payer: Self-pay | Admitting: Family Medicine

## 2009-12-09 LAB — CBC WITH DIFFERENTIAL (CANCER CENTER ONLY)
BASO%: 0.6 % (ref 0.0–2.0)
LYMPH#: 0.9 10*3/uL (ref 0.9–3.3)
MONO#: 0.4 10*3/uL (ref 0.1–0.9)
Platelets: 167 10*3/uL (ref 145–400)
RBC: 2.96 10*6/uL — ABNORMAL LOW (ref 3.70–5.32)
RDW: 13.5 % (ref 10.5–14.6)
WBC: 3.6 10*3/uL — ABNORMAL LOW (ref 3.9–10.0)

## 2009-12-09 LAB — PROTIME-INR (CHCC SATELLITE)
INR: 1.9 — ABNORMAL LOW (ref 2.0–3.5)
Protime: 22.8 Seconds — ABNORMAL HIGH (ref 10.6–13.4)

## 2009-12-09 LAB — CMP (CANCER CENTER ONLY)
ALT(SGPT): 32 U/L (ref 10–47)
AST: 32 U/L (ref 11–38)
Alkaline Phosphatase: 104 U/L — ABNORMAL HIGH (ref 26–84)
Glucose, Bld: 169 mg/dL — ABNORMAL HIGH (ref 73–118)
Sodium: 139 mEq/L (ref 128–145)
Total Bilirubin: 0.5 mg/dl (ref 0.20–1.60)
Total Protein: 7.8 g/dL (ref 6.4–8.1)

## 2009-12-09 LAB — UA PROTEIN, DIPSTICK - CHCC: Protein, Urine: 30 mg/dL

## 2009-12-16 LAB — CBC WITH DIFFERENTIAL (CANCER CENTER ONLY)
Eosinophils Absolute: 0.1 10*3/uL (ref 0.0–0.5)
LYMPH%: 24.5 % (ref 14.0–48.0)
MCH: 36.5 pg — ABNORMAL HIGH (ref 26.0–34.0)
MCV: 107 fL — ABNORMAL HIGH (ref 81–101)
MONO%: 11.7 % (ref 0.0–13.0)
Platelets: 170 10*3/uL (ref 145–400)
RBC: 2.97 10*6/uL — ABNORMAL LOW (ref 3.70–5.32)

## 2009-12-16 LAB — BASIC METABOLIC PANEL - CANCER CENTER ONLY
BUN, Bld: 18 mg/dL (ref 7–22)
Chloride: 107 mEq/L (ref 98–108)
Glucose, Bld: 133 mg/dL — ABNORMAL HIGH (ref 73–118)
Potassium: 3.7 mEq/L (ref 3.3–4.7)

## 2009-12-16 LAB — PROTIME-INR (CHCC SATELLITE)

## 2009-12-16 LAB — PROTHROMBIN TIME: Prothrombin Time: 36.8 seconds — ABNORMAL HIGH (ref 11.6–15.2)

## 2009-12-19 LAB — PROTIME-INR (CHCC SATELLITE)

## 2009-12-23 ENCOUNTER — Encounter: Payer: Self-pay | Admitting: Family Medicine

## 2009-12-23 LAB — PROTIME-INR (CHCC SATELLITE)

## 2009-12-23 LAB — CBC WITH DIFFERENTIAL (CANCER CENTER ONLY)
BASO#: 0 10*3/uL (ref 0.0–0.2)
Eosinophils Absolute: 0.1 10*3/uL (ref 0.0–0.5)
HCT: 32.4 % — ABNORMAL LOW (ref 34.8–46.6)
HGB: 10.9 g/dL — ABNORMAL LOW (ref 11.6–15.9)
LYMPH#: 0.8 10*3/uL — ABNORMAL LOW (ref 0.9–3.3)
MONO#: 0.4 10*3/uL (ref 0.1–0.9)
NEUT%: 52.4 % (ref 39.6–80.0)
WBC: 2.6 10*3/uL — ABNORMAL LOW (ref 3.9–10.0)

## 2009-12-23 LAB — CMP (CANCER CENTER ONLY)
ALT(SGPT): 89 U/L — ABNORMAL HIGH (ref 10–47)
BUN, Bld: 20 mg/dL (ref 7–22)
CO2: 25 mEq/L (ref 18–33)
Calcium: 9.6 mg/dL (ref 8.0–10.3)
Chloride: 104 mEq/L (ref 98–108)
Creat: 0.9 mg/dl (ref 0.6–1.2)

## 2009-12-25 LAB — PROTIME-INR (CHCC SATELLITE): Protime: 19.2 Seconds — ABNORMAL HIGH (ref 10.6–13.4)

## 2009-12-30 ENCOUNTER — Encounter: Payer: Self-pay | Admitting: Family Medicine

## 2009-12-30 LAB — CBC WITH DIFFERENTIAL (CANCER CENTER ONLY)
BASO%: 1 % (ref 0.0–2.0)
EOS%: 3.1 % (ref 0.0–7.0)
LYMPH%: 28.6 % (ref 14.0–48.0)
MCV: 111 fL — ABNORMAL HIGH (ref 81–101)
MONO#: 0.5 10*3/uL (ref 0.1–0.9)
Platelets: 167 10*3/uL (ref 145–400)
RDW: 14.2 % (ref 10.5–14.6)
WBC: 4.2 10*3/uL (ref 3.9–10.0)

## 2009-12-30 LAB — PROTIME-INR (CHCC SATELLITE): Protime: 27.6 Seconds — ABNORMAL HIGH (ref 10.6–13.4)

## 2010-01-02 ENCOUNTER — Ambulatory Visit: Payer: Self-pay | Admitting: Oncology

## 2010-01-06 ENCOUNTER — Encounter: Payer: Self-pay | Admitting: Family Medicine

## 2010-01-06 LAB — CBC WITH DIFFERENTIAL (CANCER CENTER ONLY)
Eosinophils Absolute: 0.1 10*3/uL (ref 0.0–0.5)
HGB: 10.2 g/dL — ABNORMAL LOW (ref 11.6–15.9)
LYMPH%: 32.6 % (ref 14.0–48.0)
MCV: 111 fL — ABNORMAL HIGH (ref 81–101)
MONO#: 0.5 10*3/uL (ref 0.1–0.9)
Platelets: 146 10*3/uL (ref 145–400)
RBC: 2.73 10*6/uL — ABNORMAL LOW (ref 3.70–5.32)
WBC: 2.4 10*3/uL — ABNORMAL LOW (ref 3.9–10.0)

## 2010-01-06 LAB — CMP (CANCER CENTER ONLY)
Albumin: 2.9 g/dL — ABNORMAL LOW (ref 3.3–5.5)
CO2: 29 mEq/L (ref 18–33)
Glucose, Bld: 136 mg/dL — ABNORMAL HIGH (ref 73–118)
Potassium: 4 mEq/L (ref 3.3–4.7)
Sodium: 140 mEq/L (ref 128–145)
Total Protein: 8 g/dL (ref 6.4–8.1)

## 2010-01-06 LAB — PROTIME-INR (CHCC SATELLITE)

## 2010-01-13 ENCOUNTER — Encounter: Payer: Self-pay | Admitting: Family Medicine

## 2010-01-13 LAB — CBC WITH DIFFERENTIAL (CANCER CENTER ONLY)
Eosinophils Absolute: 0.1 10*3/uL (ref 0.0–0.5)
HCT: 30.3 % — ABNORMAL LOW (ref 34.8–46.6)
LYMPH%: 30 % (ref 14.0–48.0)
MCV: 110 fL — ABNORMAL HIGH (ref 81–101)
MONO#: 0.2 10*3/uL (ref 0.1–0.9)
NEUT%: 60.8 % (ref 39.6–80.0)
RBC: 2.76 10*6/uL — ABNORMAL LOW (ref 3.70–5.32)
WBC: 2.9 10*3/uL — ABNORMAL LOW (ref 3.9–10.0)

## 2010-01-13 LAB — BASIC METABOLIC PANEL - CANCER CENTER ONLY
Calcium: 9.3 mg/dL (ref 8.0–10.3)
Chloride: 101 mEq/L (ref 98–108)
Creat: 0.6 mg/dl (ref 0.6–1.2)

## 2010-01-21 ENCOUNTER — Encounter: Payer: Self-pay | Admitting: Family Medicine

## 2010-01-21 LAB — CBC WITH DIFFERENTIAL (CANCER CENTER ONLY)
BASO#: 0 10*3/uL (ref 0.0–0.2)
Eosinophils Absolute: 0 10*3/uL (ref 0.0–0.5)
HGB: 10.4 g/dL — ABNORMAL LOW (ref 11.6–15.9)
LYMPH#: 0.8 10*3/uL — ABNORMAL LOW (ref 0.9–3.3)
NEUT#: 0.3 10*3/uL — CL (ref 1.5–6.5)
RBC: 2.76 10*6/uL — ABNORMAL LOW (ref 3.70–5.32)

## 2010-01-21 LAB — CMP (CANCER CENTER ONLY)
Albumin: 2.9 g/dL — ABNORMAL LOW (ref 3.3–5.5)
BUN, Bld: 12 mg/dL (ref 7–22)
Calcium: 9.4 mg/dL (ref 8.0–10.3)
Chloride: 100 mEq/L (ref 98–108)
Glucose, Bld: 144 mg/dL — ABNORMAL HIGH (ref 73–118)
Potassium: 3.3 mEq/L (ref 3.3–4.7)

## 2010-01-21 LAB — PROTIME-INR (CHCC SATELLITE): INR: 2.5 (ref 2.0–3.5)

## 2010-01-27 ENCOUNTER — Encounter: Payer: Self-pay | Admitting: Family Medicine

## 2010-01-27 LAB — CMP (CANCER CENTER ONLY)
AST: 40 U/L — ABNORMAL HIGH (ref 11–38)
Albumin: 3 g/dL — ABNORMAL LOW (ref 3.3–5.5)
Alkaline Phosphatase: 145 U/L — ABNORMAL HIGH (ref 26–84)
BUN, Bld: 13 mg/dL (ref 7–22)
Potassium: 4.2 mEq/L (ref 3.3–4.7)
Total Bilirubin: 0.6 mg/dl (ref 0.20–1.60)

## 2010-01-27 LAB — CBC WITH DIFFERENTIAL (CANCER CENTER ONLY)
BASO#: 0 10*3/uL (ref 0.0–0.2)
EOS%: 2.2 % (ref 0.0–7.0)
HGB: 11.1 g/dL — ABNORMAL LOW (ref 11.6–15.9)
LYMPH%: 21.8 % (ref 14.0–48.0)
MCH: 37.7 pg — ABNORMAL HIGH (ref 26.0–34.0)
MCHC: 33.7 g/dL (ref 32.0–36.0)
MONO%: 9 % (ref 0.0–13.0)
NEUT#: 3.4 10*3/uL (ref 1.5–6.5)
Platelets: 308 10*3/uL (ref 145–400)

## 2010-01-27 LAB — PROTIME-INR (CHCC SATELLITE)

## 2010-01-30 ENCOUNTER — Ambulatory Visit: Payer: Self-pay | Admitting: Oncology

## 2010-02-04 ENCOUNTER — Encounter: Payer: Self-pay | Admitting: Family Medicine

## 2010-02-04 LAB — CMP (CANCER CENTER ONLY)
ALT(SGPT): 39 U/L (ref 10–47)
AST: 30 U/L (ref 11–38)
Albumin: 3 g/dL — ABNORMAL LOW (ref 3.3–5.5)
Alkaline Phosphatase: 182 U/L — ABNORMAL HIGH (ref 26–84)
Glucose, Bld: 147 mg/dL — ABNORMAL HIGH (ref 73–118)
Potassium: 3.8 mEq/L (ref 3.3–4.7)
Sodium: 139 mEq/L (ref 128–145)
Total Protein: 7.6 g/dL (ref 6.4–8.1)

## 2010-02-04 LAB — CBC WITH DIFFERENTIAL (CANCER CENTER ONLY)
HGB: 11 g/dL — ABNORMAL LOW (ref 11.6–15.9)
MCHC: 34.1 g/dL (ref 32.0–36.0)
Platelets: 146 10*3/uL (ref 145–400)
RDW: 15 % — ABNORMAL HIGH (ref 10.5–14.6)

## 2010-02-04 LAB — MANUAL DIFFERENTIAL (CHCC SATELLITE)
MONO: 7 % (ref 0–13)
Myelocytes: 2 % — ABNORMAL HIGH (ref 0–0)

## 2010-02-04 LAB — PROTIME-INR (CHCC SATELLITE)

## 2010-02-10 ENCOUNTER — Encounter: Payer: Self-pay | Admitting: Family Medicine

## 2010-02-10 LAB — CBC WITH DIFFERENTIAL (CANCER CENTER ONLY)
BASO%: 0.7 % (ref 0.0–2.0)
Eosinophils Absolute: 0.2 10*3/uL (ref 0.0–0.5)
MCH: 37.4 pg — ABNORMAL HIGH (ref 26.0–34.0)
MONO#: 0.6 10*3/uL (ref 0.1–0.9)
MONO%: 9.7 % (ref 0.0–13.0)
NEUT#: 4 10*3/uL (ref 1.5–6.5)
Platelets: 185 10*3/uL (ref 145–400)
RBC: 2.87 10*6/uL — ABNORMAL LOW (ref 3.70–5.32)
RDW: 14.8 % — ABNORMAL HIGH (ref 10.5–14.6)
WBC: 5.8 10*3/uL (ref 3.9–10.0)

## 2010-02-10 LAB — CMP (CANCER CENTER ONLY)
ALT(SGPT): 38 U/L (ref 10–47)
AST: 35 U/L (ref 11–38)
Albumin: 3 g/dL — ABNORMAL LOW (ref 3.3–5.5)
CO2: 25 mEq/L (ref 18–33)
Calcium: 9.2 mg/dL (ref 8.0–10.3)
Chloride: 103 mEq/L (ref 98–108)
Potassium: 3.1 mEq/L — ABNORMAL LOW (ref 3.3–4.7)

## 2010-02-10 LAB — PROTIME-INR (CHCC SATELLITE)
INR: 2.8 (ref 2.0–3.5)
Protime: 33.6 Seconds — ABNORMAL HIGH (ref 10.6–13.4)

## 2010-02-23 ENCOUNTER — Encounter: Payer: Self-pay | Admitting: Family Medicine

## 2010-02-24 ENCOUNTER — Ambulatory Visit: Payer: Self-pay | Admitting: Oncology

## 2010-02-26 ENCOUNTER — Encounter: Payer: Self-pay | Admitting: Family Medicine

## 2010-02-26 LAB — CBC WITH DIFFERENTIAL (CANCER CENTER ONLY)
BASO%: 0.4 % (ref 0.0–2.0)
EOS%: 4.4 % (ref 0.0–7.0)
LYMPH#: 0.8 10*3/uL — ABNORMAL LOW (ref 0.9–3.3)
MCHC: 34.1 g/dL (ref 32.0–36.0)
NEUT#: 1.2 10*3/uL — ABNORMAL LOW (ref 1.5–6.5)
Platelets: 242 10*3/uL (ref 145–400)
RDW: 12.7 % (ref 10.5–14.6)
WBC: 2.4 10*3/uL — ABNORMAL LOW (ref 3.9–10.0)

## 2010-02-26 LAB — CMP (CANCER CENTER ONLY)
ALT(SGPT): 44 U/L (ref 10–47)
AST: 41 U/L — ABNORMAL HIGH (ref 11–38)
Albumin: 3.1 g/dL — ABNORMAL LOW (ref 3.3–5.5)
Calcium: 9.3 mg/dL (ref 8.0–10.3)
Chloride: 106 mEq/L (ref 98–108)
Creat: 0.5 mg/dl — ABNORMAL LOW (ref 0.6–1.2)
Potassium: 3.7 mEq/L (ref 3.3–4.7)
Sodium: 141 mEq/L (ref 128–145)
Total Protein: 7.6 g/dL (ref 6.4–8.1)

## 2010-03-05 LAB — PROTIME-INR (CHCC SATELLITE): Protime: 33.6 Seconds — ABNORMAL HIGH (ref 10.6–13.4)

## 2010-03-12 LAB — PROTIME-INR (CHCC SATELLITE): Protime: 18 Seconds — ABNORMAL HIGH (ref 10.6–13.4)

## 2010-03-19 LAB — PROTIME-INR (CHCC SATELLITE): INR: 2 (ref 2.0–3.5)

## 2010-03-23 ENCOUNTER — Ambulatory Visit: Payer: Self-pay | Admitting: Oncology

## 2010-03-26 LAB — PROTIME-INR (CHCC SATELLITE): INR: 3.1 (ref 2.0–3.5)

## 2010-04-02 ENCOUNTER — Encounter: Payer: Self-pay | Admitting: Family Medicine

## 2010-04-02 LAB — CBC WITH DIFFERENTIAL (CANCER CENTER ONLY)
BASO#: 0 10*3/uL (ref 0.0–0.2)
EOS%: 3.2 % (ref 0.0–7.0)
HGB: 12 g/dL (ref 11.6–15.9)
LYMPH#: 1.2 10*3/uL (ref 0.9–3.3)
MCHC: 34.6 g/dL (ref 32.0–36.0)
NEUT#: 4.3 10*3/uL (ref 1.5–6.5)
RBC: 3.38 10*6/uL — ABNORMAL LOW (ref 3.70–5.32)

## 2010-04-02 LAB — PROTIME-INR (CHCC SATELLITE): INR: 2.1 (ref 2.0–3.5)

## 2010-04-09 LAB — PROTIME-INR (CHCC SATELLITE)
INR: 2.5 (ref 2.0–3.5)
Protime: 30 Seconds — ABNORMAL HIGH (ref 10.6–13.4)

## 2010-04-24 ENCOUNTER — Ambulatory Visit: Payer: Self-pay | Admitting: Oncology

## 2010-04-30 LAB — PROTIME-INR (CHCC SATELLITE)

## 2010-05-21 ENCOUNTER — Ambulatory Visit: Payer: Self-pay | Admitting: Oncology

## 2010-05-21 ENCOUNTER — Encounter: Payer: Self-pay | Admitting: Family Medicine

## 2010-05-29 ENCOUNTER — Ambulatory Visit: Payer: Self-pay | Admitting: Family Medicine

## 2010-05-29 LAB — CMP (CANCER CENTER ONLY)
Albumin: 3.8 g/dL (ref 3.3–5.5)
Alkaline Phosphatase: 113 U/L — ABNORMAL HIGH (ref 26–84)
BUN, Bld: 19 mg/dL (ref 7–22)
Creat: 0.7 mg/dl (ref 0.6–1.2)
Glucose, Bld: 118 mg/dL (ref 73–118)
Total Bilirubin: 0.6 mg/dl (ref 0.20–1.60)

## 2010-05-29 LAB — PROTIME-INR (CHCC SATELLITE): INR: 2.1 (ref 2.0–3.5)

## 2010-05-29 LAB — CEA: CEA: 0.5 ng/mL (ref 0.0–5.0)

## 2010-05-29 LAB — CONVERTED CEMR LAB
Bacteria, UA: 0
Bilirubin Urine: NEGATIVE
Glucose, Urine, Semiquant: NEGATIVE
Ketones, urine, test strip: NEGATIVE
Mucus, UA: 0
Protein, U semiquant: 30
Urobilinogen, UA: 0.2
pH: 7

## 2010-05-29 LAB — CBC WITH DIFFERENTIAL (CANCER CENTER ONLY)
BASO#: 0 10*3/uL (ref 0.0–0.2)
EOS%: 1.2 % (ref 0.0–7.0)
Eosinophils Absolute: 0.1 10*3/uL (ref 0.0–0.5)
HGB: 11.2 g/dL — ABNORMAL LOW (ref 11.6–15.9)
LYMPH%: 11.9 % — ABNORMAL LOW (ref 14.0–48.0)
MCH: 33.7 pg (ref 26.0–34.0)
MCHC: 34.5 g/dL (ref 32.0–36.0)
MCV: 98 fL (ref 81–101)
MONO%: 6.2 % (ref 0.0–13.0)
NEUT%: 80.2 % — ABNORMAL HIGH (ref 39.6–80.0)
RBC: 3.32 10*6/uL — ABNORMAL LOW (ref 3.70–5.32)

## 2010-06-01 ENCOUNTER — Ambulatory Visit (HOSPITAL_COMMUNITY): Admission: RE | Admit: 2010-06-01 | Discharge: 2010-06-01 | Payer: Self-pay | Admitting: Hematology and Oncology

## 2010-06-02 ENCOUNTER — Encounter: Payer: Self-pay | Admitting: Family Medicine

## 2010-06-10 ENCOUNTER — Ambulatory Visit (HOSPITAL_COMMUNITY): Admission: RE | Admit: 2010-06-10 | Discharge: 2010-06-10 | Payer: Self-pay | Admitting: Oncology

## 2010-06-30 ENCOUNTER — Ambulatory Visit: Payer: Self-pay | Admitting: Oncology

## 2010-07-10 ENCOUNTER — Encounter: Payer: Self-pay | Admitting: Family Medicine

## 2010-07-10 LAB — PROTIME-INR (CHCC SATELLITE)
INR: 2.8 (ref 2.0–3.5)
Protime: 33.6 Seconds — ABNORMAL HIGH (ref 10.6–13.4)

## 2010-07-29 ENCOUNTER — Ambulatory Visit: Payer: Self-pay | Admitting: Oncology

## 2010-08-21 ENCOUNTER — Ambulatory Visit: Payer: Self-pay | Admitting: Oncology

## 2010-08-28 LAB — PROTIME-INR (CHCC SATELLITE): Protime: 32.4 Seconds — ABNORMAL HIGH (ref 10.6–13.4)

## 2010-09-03 ENCOUNTER — Encounter (INDEPENDENT_AMBULATORY_CARE_PROVIDER_SITE_OTHER): Payer: Self-pay | Admitting: *Deleted

## 2010-09-29 ENCOUNTER — Ambulatory Visit: Payer: Self-pay | Admitting: Oncology

## 2010-10-02 LAB — CBC WITH DIFFERENTIAL (CANCER CENTER ONLY)
BASO%: 0.6 % (ref 0.0–2.0)
EOS%: 1.5 % (ref 0.0–7.0)
LYMPH%: 18.4 % (ref 14.0–48.0)
MCH: 33.9 pg (ref 26.0–34.0)
MCHC: 35.5 g/dL (ref 32.0–36.0)
MCV: 96 fL (ref 81–101)
MONO#: 0.6 10*3/uL (ref 0.1–0.9)
MONO%: 8.9 % (ref 0.0–13.0)
Platelets: 215 10*3/uL (ref 145–400)
RDW: 11 % (ref 10.5–14.6)
WBC: 6.5 10*3/uL (ref 3.9–10.0)

## 2010-10-02 LAB — CMP (CANCER CENTER ONLY)
ALT(SGPT): 53 U/L — ABNORMAL HIGH (ref 10–47)
Alkaline Phosphatase: 117 U/L — ABNORMAL HIGH (ref 26–84)
Creat: 0.8 mg/dl (ref 0.6–1.2)
Sodium: 142 mEq/L (ref 128–145)
Total Bilirubin: 0.6 mg/dl (ref 0.20–1.60)
Total Protein: 8.8 g/dL — ABNORMAL HIGH (ref 6.4–8.1)

## 2010-10-09 ENCOUNTER — Ambulatory Visit (HOSPITAL_COMMUNITY): Admission: RE | Admit: 2010-10-09 | Discharge: 2010-10-09 | Payer: Self-pay | Admitting: Oncology

## 2010-10-13 ENCOUNTER — Encounter: Payer: Self-pay | Admitting: Family Medicine

## 2010-10-20 ENCOUNTER — Ambulatory Visit (HOSPITAL_COMMUNITY): Admission: RE | Admit: 2010-10-20 | Discharge: 2010-10-20 | Payer: Self-pay | Admitting: Oncology

## 2010-10-26 ENCOUNTER — Telehealth (INDEPENDENT_AMBULATORY_CARE_PROVIDER_SITE_OTHER): Payer: Self-pay | Admitting: *Deleted

## 2010-10-26 ENCOUNTER — Ambulatory Visit: Payer: Self-pay | Admitting: Oncology

## 2010-10-26 ENCOUNTER — Ambulatory Visit: Payer: Self-pay | Admitting: Thoracic Surgery (Cardiothoracic Vascular Surgery)

## 2010-10-26 LAB — PROTIME-INR: Protime: 18 Seconds — ABNORMAL HIGH (ref 10.6–13.4)

## 2010-10-27 ENCOUNTER — Encounter: Payer: Self-pay | Admitting: Family Medicine

## 2010-10-27 LAB — COMPREHENSIVE METABOLIC PANEL
ALT: 67 U/L — ABNORMAL HIGH (ref 0–35)
Albumin: 4.4 g/dL (ref 3.5–5.2)
Alkaline Phosphatase: 123 U/L — ABNORMAL HIGH (ref 39–117)
Calcium: 9.5 mg/dL (ref 8.4–10.5)
Chloride: 102 mEq/L (ref 96–112)
Glucose, Bld: 96 mg/dL (ref 70–99)
Sodium: 141 mEq/L (ref 135–145)
Total Bilirubin: 0.4 mg/dL (ref 0.3–1.2)
Total Protein: 8.6 g/dL — ABNORMAL HIGH (ref 6.0–8.3)

## 2010-10-27 LAB — CEA: CEA: 0.7 ng/mL (ref 0.0–5.0)

## 2010-10-27 LAB — CBC WITH DIFFERENTIAL/PLATELET
BASO%: 0.3 % (ref 0.0–2.0)
HCT: 39 % (ref 34.8–46.6)
LYMPH%: 12.9 % — ABNORMAL LOW (ref 14.0–49.7)
MCH: 33.8 pg (ref 25.1–34.0)
MCHC: 35 g/dL (ref 31.5–36.0)
MCV: 96.8 fL (ref 79.5–101.0)
MONO%: 7.6 % (ref 0.0–14.0)
NEUT%: 78.1 % — ABNORMAL HIGH (ref 38.4–76.8)
Platelets: 267 10*3/uL (ref 145–400)
RBC: 4.02 10*6/uL (ref 3.70–5.45)

## 2010-11-03 ENCOUNTER — Ambulatory Visit
Admission: RE | Admit: 2010-11-03 | Discharge: 2010-12-03 | Payer: Self-pay | Source: Home / Self Care | Attending: Radiation Oncology | Admitting: Radiation Oncology

## 2010-11-03 ENCOUNTER — Ambulatory Visit: Payer: Self-pay | Admitting: Family Medicine

## 2010-11-04 LAB — CONVERTED CEMR LAB
AST: 28 units/L (ref 0–37)
Albumin: 3.9 g/dL (ref 3.5–5.2)
Alkaline Phosphatase: 121 units/L — ABNORMAL HIGH (ref 39–117)
BUN: 20 mg/dL (ref 6–23)
CO2: 27 meq/L (ref 19–32)
Calcium: 9.4 mg/dL (ref 8.4–10.5)
Cholesterol: 219 mg/dL — ABNORMAL HIGH (ref 0–200)
Glucose, Bld: 101 mg/dL — ABNORMAL HIGH (ref 70–99)
HDL: 33 mg/dL — ABNORMAL LOW (ref >39.00)
Sodium: 139 meq/L (ref 135–145)
Total Protein: 8.1 g/dL (ref 6.0–8.3)

## 2010-11-04 LAB — PROTIME-INR: INR: 3.5 (ref 2.00–3.50)

## 2010-11-05 ENCOUNTER — Encounter: Payer: Self-pay | Admitting: Family Medicine

## 2010-11-06 ENCOUNTER — Ambulatory Visit: Payer: Self-pay | Admitting: Family Medicine

## 2010-11-06 DIAGNOSIS — G609 Hereditary and idiopathic neuropathy, unspecified: Secondary | ICD-10-CM | POA: Insufficient documentation

## 2010-11-06 LAB — CONVERTED CEMR LAB
Cholesterol, target level: 200 mg/dL
LDL Goal: 100 mg/dL

## 2010-11-12 LAB — PROTIME-INR
INR: 1.8 — ABNORMAL LOW (ref 2.00–3.50)
Protime: 21.6 Seconds — ABNORMAL HIGH (ref 10.6–13.4)

## 2010-11-16 ENCOUNTER — Telehealth: Payer: Self-pay | Admitting: Family Medicine

## 2010-11-16 ENCOUNTER — Encounter: Payer: Self-pay | Admitting: Family Medicine

## 2010-11-16 LAB — HM DIABETES EYE EXAM

## 2010-11-19 ENCOUNTER — Ambulatory Visit: Payer: Self-pay | Admitting: Family Medicine

## 2010-11-19 ENCOUNTER — Telehealth: Payer: Self-pay | Admitting: Family Medicine

## 2010-11-19 DIAGNOSIS — I82409 Acute embolism and thrombosis of unspecified deep veins of unspecified lower extremity: Secondary | ICD-10-CM | POA: Insufficient documentation

## 2010-11-19 LAB — CONVERTED CEMR LAB
INR: 2.5
Prothrombin Time: 30.1 s

## 2010-11-23 ENCOUNTER — Encounter: Payer: Self-pay | Admitting: Family Medicine

## 2010-11-26 ENCOUNTER — Encounter
Admission: RE | Admit: 2010-11-26 | Discharge: 2010-11-26 | Payer: Self-pay | Source: Home / Self Care | Attending: Family Medicine | Admitting: Family Medicine

## 2010-12-01 ENCOUNTER — Encounter (INDEPENDENT_AMBULATORY_CARE_PROVIDER_SITE_OTHER): Payer: Self-pay | Admitting: *Deleted

## 2010-12-02 ENCOUNTER — Ambulatory Visit: Payer: Self-pay | Admitting: Family Medicine

## 2010-12-02 LAB — CONVERTED CEMR LAB: INR: 2.1

## 2010-12-19 ENCOUNTER — Encounter: Payer: Self-pay | Admitting: Family Medicine

## 2010-12-21 ENCOUNTER — Ambulatory Visit: Payer: Self-pay | Admitting: Oncology

## 2010-12-23 ENCOUNTER — Encounter: Payer: Self-pay | Admitting: Family Medicine

## 2010-12-23 LAB — BASIC METABOLIC PANEL
BUN: 20 mg/dL (ref 6–23)
CO2: 26 mEq/L (ref 19–32)
Calcium: 10 mg/dL (ref 8.4–10.5)
Chloride: 103 mEq/L (ref 96–112)
Creatinine, Ser: 0.87 mg/dL (ref 0.40–1.20)
Glucose, Bld: 102 mg/dL — ABNORMAL HIGH (ref 70–99)
Potassium: 4.3 mEq/L (ref 3.5–5.3)
Sodium: 141 mEq/L (ref 135–145)

## 2010-12-23 LAB — CBC WITH DIFFERENTIAL/PLATELET
BASO%: 0 % (ref 0.0–2.0)
Basophils Absolute: 0 10*3/uL (ref 0.0–0.1)
EOS%: 0.8 % (ref 0.0–7.0)
Eosinophils Absolute: 0.1 10*3/uL (ref 0.0–0.5)
HCT: 40.5 % (ref 34.8–46.6)
HGB: 14.2 g/dL (ref 11.6–15.9)
LYMPH%: 8.1 % — ABNORMAL LOW (ref 14.0–49.7)
MCH: 33.9 pg (ref 25.1–34.0)
MCHC: 35.1 g/dL (ref 31.5–36.0)
MCV: 96.8 fL (ref 79.5–101.0)
MONO#: 0.6 10*3/uL (ref 0.1–0.9)
MONO%: 9.1 % (ref 0.0–14.0)
NEUT#: 5.6 10*3/uL (ref 1.5–6.5)
NEUT%: 82 % — ABNORMAL HIGH (ref 38.4–76.8)
Platelets: 229 10*3/uL (ref 145–400)
RBC: 4.19 10*6/uL (ref 3.70–5.45)
RDW: 12.9 % (ref 11.2–14.5)
WBC: 6.9 10*3/uL (ref 3.9–10.3)
lymph#: 0.6 10*3/uL — ABNORMAL LOW (ref 0.9–3.3)

## 2010-12-23 LAB — CEA: CEA: <0.5 ng/mL (ref 0.0–5.0)

## 2010-12-27 ENCOUNTER — Encounter: Payer: Self-pay | Admitting: General Surgery

## 2010-12-28 ENCOUNTER — Encounter: Payer: Self-pay | Admitting: Thoracic Surgery (Cardiothoracic Vascular Surgery)

## 2010-12-30 ENCOUNTER — Ambulatory Visit
Admission: RE | Admit: 2010-12-30 | Discharge: 2010-12-30 | Payer: Self-pay | Source: Home / Self Care | Attending: Family Medicine | Admitting: Family Medicine

## 2010-12-30 LAB — CONVERTED CEMR LAB: Prothrombin Time: 22.7 s

## 2011-01-05 NOTE — Miscellaneous (Signed)
Summary: [facility]   Clinical Lists Changes  Observations: Added new observation of FLU VAXLOT: [id number] (09/03/2010 16:13) Added new observation of FLU VAX EXP: 03/03/2011 (09/03/2010 16:13) Added new observation of FLU [facility]AXBY: [facility] (09/03/2010 16:13) Added new observation of FLU VAXRTE: IM (09/03/2010 16:13) Added new observation of FLU VAX DSE: 0.5 ml (09/03/2010 16:13) Added new observation of FLU VAXMFR: Sanofi Pasteur (09/03/2010 16:13) Added new observation of FLU VAX SITE: right deltoid (09/03/2010 16:13) Added new observation of FLU VAX: Fluvax 3+ (09/03/2010 16:13)      Immunizations Administered:  Influenza Vaccine # 1:    Vaccine Type: Fluvax 3+    Site: right deltoid    Mfr: Sanofi Pasteur    Dose: 0.5 ml    Route: IM    [facility] by: [facility]    Exp. Date: 03/03/2011    Lot #: [id number]

## 2011-01-05 NOTE — Consult Note (Signed)
Summary: [facility]   Imported By: Lanelle Bal 06/12/2010 13:16:22  _____________________________________________________________________  External Attachment:    Type:   Image     Comment:   External Document  Appended Document: [facility] gross hematuria - cystoscopy negative. nothing to f/u.

## 2011-01-05 NOTE — Assessment & Plan Note (Signed)
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  She had gross hematuria for the past 3-4 days, subsequently this discontinued, and now she has no gross hematuria. She is currently on Coumadin, and she self decreased her dosing on her Coumadin level.  Gross blood in urine, yesterday it wsa still bleeding and by this morning. No accident or fall.   stage iv colon in lung and also had breast   was on tamoxifen, now on coumadin.   usually 5 mg, 4 d 2.5 mg, 3 days a week.   pet scan - on Monday.  Allergies: 1)  ! Amoxicillin 2)  ! Erythromycin  Past History:  Past medical, surgical, family and social histories (including risk factors) reviewed, and no changes noted (except as noted below).  Past Medical History: Reviewed history from 09/09/2009 and no changes required. Anemia-iron deficiency Osteoarthritis Allergic rhinitis Colon cancer Breast cancer-intraductal carcinoma s/p lumpectomy, XRT, chemo (January 2010) Metastatic colon cancer to left lung (s/p lobectomy 07/01/09)  Past Surgical History: Reviewed history from 09/09/2009 and no changes required. 2005 colectomy, partial (se[place] Dr. Shelda Pal in  [place] 1965 breast bx, fibroma removal  left breast cancer, s/p lumpectomy and radiation 1/10 Left lobectomy 7/10 for metastatic colon cancer  Family History: Reviewed history from 09/05/2008 and no changes required. father: lung cancer, HTN, DM mother: CHF, CVA,  HTN, low thyroid PGM: DM PGF: colon cancer MGF: MI  age 59 MGM: died at early age unknown cause brother: low thyriod  Social History: Reviewed history from 09/05/2008 and no changes required. 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 No nausea, vomiting, diarrhea. No fevers, chills, sweats.  Physical Exam  General:  Well-developed,well-nourished,in no acute distress; alert,appropriate and cooperative throughout examination Head:  Normocephalic and atraumatic without obvious abnormalities. No apparent alopecia or balding. Ears:  no external deformities.   Nose:  no external deformity.   Lungs:  Normal respiratory effort, chest expands symmetrically. Lungs are clear to auscultation, no crackles or wheezes. Heart:  Normal rate and regular rhythm. S1 and S2 normal without gallop, murmur, click, rub or other extra sounds. Extremities:  No clubbing, cyanosis, edema, or deformity noted with normal full range of motion of all joints.   Neurologic:  alert & oriented X3 and gait normal.   Psych:  Cognition and judgment appear intact. Alert and cooperative with normal attention span and concentration. No apparent delusions, illusions, hallucinations   Impression & Recommendations:  Problem # 1:  GROSS HEMATURIA (ICD-599.71) gross hematuria with confirmatory microscopic hematuria after gross hematuria stopped  in a setting where the patient  does not have any active infection. The patient is on Coumadin, however she still does need  further evaluation to fully rule out  any potential bladder neoplasm or  renal neoplasm or metastasis from her prior cancers.  I called and discussed this case with radiology, and given that the patient does have  a PET scan on Monday, they felt that this would be  acceptable initial  form of scanning and would pick up any increased cellular activity  that would go along with these concerns.  Additionally, the patient that we'll likely need cystoscopy, and I'm going to refer her to urology.  will send a copy of this note to Dr. 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  Complete Medication List: 1)  Hydrochlorothiazide 12.5 Mg Tabs (Hydrochlorothiazide) .... Take 1 tablet by mouth once a day 2)  Multivitamins Tabs (Multiple vitamin) .... Take 1 tablet by mouth once a day 3)  Prochlorperazine Maleate 10 Mg Tabs (Prochlorperazine maleate) .... As needed 4)  Ondansetron Hcl 8 Mg Tabs (Ondansetron hcl) .... As needed 5)  Avapro 150 Mg Tabs (Irbesartan) .Marland Kitchen.. 1 tab by mouth daily 6)  Tamoxifen Citrate 20 Mg Tabs (Tamoxifen citrate) .Marland Kitchen.. 1 tab by mouth daily 7)  Fish Oil 1000 Mg Caps (Omega-3 fatty acids) .Marland Kitchen.. 1 tab by mouth daily 8)  Coumadin 2.5 Mg Tabs (Warfarin sodium) .... As directed per hemeonc office  Patient Instructions: 1)  Referral Appointment Information 2)  Day/Date: 3)  Time: 4)  Place/MD: 5)  Address: 6)  Phone/Fax: 7)  Patient given appointment information. Information/Orders faxed/mailed.   Current Allergies (reviewed today): ! AMOXICILLIN ! ERYTHROMYCIN  Laboratory Results   Urine Tests  Date/Time Received: May 29, 2010 8:29 AM  Date/Time Reported: May 29, 2010 8:29 AM   Routine Urinalysis   Color: yellow Appearance: Hazy Glucose: negative   (Normal Range: Negative) Bilirubin: negative   (Normal Range: Negative) Ketone: negative   (Normal Range: Negative) Spec. Gravity: 1.010   (Normal Range: 1.003-1.035) Blood: moderate   (Normal Range:  Negative) pH: 7.0   (Normal Range: 5.0-8.0) Protein: 30   (Normal Range: Negative) Urobilinogen: 0.2   (Normal Range: 0-1) Nitrite: negative   (Normal Range: Negative) Leukocyte Esterace: small   (Normal Range: Negative)  Urine Microscopic WBC/HPF: 0-1 RBC/HPF: Many Bacteria/HPF: 0 Mucous/HPF: 0 Epithelial/HPF: rare Crystals/HPF: 0 Casts/LPF: 0    Comments: Bibiana Gillean MD  May 29, 2010 9:00 AM

## 2011-01-05 NOTE — Letter (Signed)
Summary: [facility]  [facility]   Imported By: Lanelle Bal 12/10/2009 10:22:13  _____________________________________________________________________  External Attachment:    Type:   Image     Comment:   External Document  Appended Document: Orders Update Medications Added COUMADIN 2.5 MG TABS (WARFARIN SODIUM) as directed per [facility] office          Clinical Lists Changes  Problems: Changed problem from LEG PAIN, LEFT (ICD-729.5) to PHLEBITIS&THROMBOPHLEB SUP VESSELS LOWER EXTREM (ICD-451.0) Medications: Added new medication of COUMADIN 2.5 MG TABS (WARFARIN SODIUM) as directed per [facility] office

## 2011-01-05 NOTE — Letter (Signed)
[facility]   Imported By: Maryln Gottron 07/21/2010 12:45:41  _____________________________________________________________________  External Attachment:    Type:   Image     Comment:   External Document

## 2011-01-05 NOTE — Letter (Signed)
[facility]   Imported By: Lanelle Bal 06/12/2010 11:43:19  _____________________________________________________________________  External Attachment:    Type:   Image     Comment:   External Document

## 2011-01-05 NOTE — Letter (Signed)
[facility]   Imported By: Lanelle Bal 02/20/2010 09:00:43  _____________________________________________________________________  External Attachment:    Type:   Image     Comment:   External Document

## 2011-01-05 NOTE — Progress Notes (Signed)
----   Converted from flag ---- ---- 10/25/2010 9:19 PM, [name] MD wrote: Dx 272.0 CMET, lipids  ---- 10/23/2010 12:24 PM, Liane Comber CMA (AAMA) wrote: Lab orders please! Good Morning! This pt is scheduled for cpx labs Tuesday, which labs to draw and dx codes to use? Thanks Tasha ------------------------------

## 2011-01-05 NOTE — Letter (Signed)
[facility]   Imported By: Lanelle Bal 01/01/2010 10:05:15  _____________________________________________________________________  External Attachment:    Type:   Image     Comment:   External Document

## 2011-01-05 NOTE — Assessment & Plan Note (Signed)
Summary: CPX/CLE   Vital Signs:  Patient profile:   65 year old female Height:      64 inches Weight:      200.75 pounds BMI:     34.58 Temp:     98.1 degrees F oral Pulse rate:   82 / minute Pulse rhythm:   regular BP sitting:   120 / 84  (right arm) Cuff size:   large  Vitals Entered By: Melody Comas (November 06, 2010 2:00 PM) CC: medical managment, Hypertension Management, Lipid Management   History of Present Illness: Breast cancer-intraductal carcinoma s/p lumpectomy, XRT, chemo (January 2010), radiation 7 weeks Metastatic colon cancer to left lung (s/p lobectomy 07/01/09), chemotherapy Last check in June area on left chest wall near posterior10th rib...10/2010 PET scan  and bx showed colon  cancer.  Oncologist  Dr. Welton Flakes. Plan is radiation and chemotherpay. Cannot operate on chest wall lesion per Dr. Dorris Fetch.    Has a lot of support in family.  Has some baseline SOB witrh exertion after lobectomy. NO chest pain. No pain other than at biopsy site.    Hypertension History:      She denies headache, palpitations, and dyspnea with exertion.  Well cntorlled at home on HCTZ, avapro was added after chemo.. only taking 1/2 tab by mouth daily .        Positive major cardiovascular risk factors include female age 28 years old or older, diabetes, hyperlipidemia, and hypertension.  Negative major cardiovascular risk factors include non-tobacco-user status.    Lipid Management History:      Positive NCEP/ATP III risk factors include female age 66 years old or older, diabetes, HDL cholesterol less than 40, and hypertension.  Negative NCEP/ATP III risk factors include non-tobacco-user status.        Adjunctive measures started by the patient include aerobic exercise and fiber.      Problems Prior to Update: 1)  Gross Hematuria  (ICD-599.71) 2)  Phlebitis&thrombophleb Sup Vessels Lower Extrem  (ICD-451.0) 3)  Chest Pain-unspecified  (ICD-786.50) 4)  Aodm  (ICD-250.00) 5)   Adenocarcinoma, Breast  (ICD-174.9) 6)  Mammogram, Abnormal, Left  (ICD-793.80) 7)  Routine Gynecological Examination  (ICD-V72.31) 8)  Well Woman  (ICD-V70.0) 9)  Essential Hypertension  (ICD-401.9) 10)  Special Screening For Osteoporosis  (ICD-V82.81) 11)  Other Screening Mammogram  (ICD-V76.12) 12)  Hypercholesterolemia  (ICD-272.0) 13)  Electrocardiogram, Abnormal  (ICD-794.31) 14)  Arm Pain, Left  (ICD-729.5) 15)  Overweight  (ICD-278.02) 16)  Hx of Plantar Fasciitis, Left  (ICD-728.71) 17)  Allergic Rhinitis  (ICD-477.9) 18)  Adenocarcinoma, Colon  (ICD-153.9) 19)  Osteoarthritis  (ICD-715.90) 20)  Anemia-iron Deficiency  (ICD-280.9)  Current Medications (verified): 1)  Hydrochlorothiazide 12.5 Mg Tabs (Hydrochlorothiazide) .... Take 1 Tablet By Mouth Once A Day 2)  Multivitamins   Tabs (Multiple Vitamin) .... Take 1 Tablet By Mouth Once A Day 3)  Avapro 150 Mg Tabs (Irbesartan) .... 1/2  Tab By Mouth Daily 4)  Coumadin 2.5 Mg Tabs (Warfarin Sodium) .... As Directed Per Hemeonc Office 5)  Vitamin B-12 Cr 2000 Mcg Cr-Tabs (Cyanocobalamin) .... Take One By Mouth Daily 6)  Aromasin 25 Mg Tabs (Exemestane) .... Take One By Mouth Daily  Allergies: 1)  ! Amoxicillin 2)  ! Erythromycin  Past History:  Past medical, surgical, family and social histories (including risk factors) reviewed, and no changes noted (except as noted below).  Past Medical History: Reviewed history from 09/09/2009 and no changes required. Anemia-iron deficiency Osteoarthritis Allergic rhinitis  Colon cancer Breast cancer-intraductal carcinoma s/p lumpectomy, XRT, chemo (January 2010) Metastatic colon cancer to left lung (s/p lobectomy 07/01/09)  Past Surgical History: Reviewed history from 09/09/2009 and no changes required. 2005 colectomy, partial (se[place]. Shelda Pal in  [place] 1965 breast bx, fibroma removal  left breast cancer, s/p lumpectomy and radiation 1/10 Left lobectomy 7/10 for metastatic  colon cancer  Family History: Reviewed history from 09/05/2008 and no changes required. father: lung cancer, HTN, DM mother: CHF, CVA, HTN, low thyroid PGM: DM PGF: colon cancer MGF: MI  age 6 MGM: died at early age unknown cause brother: low thyriod  Social History: Reviewed history from 09/05/2008 and no changes required. 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 Resp:  Complains of shortness of breath. GI:  Denies abdominal pain, bloody stools, constipation, and diarrhea. GU:  Denies dysuria. Neuro:  neuropathy in feet since chemotherapy.. B entire feet.. using vit B12... not interested in medicaiton for this. Psych:  Denies anxiety, depression, and suicidal thoughts/plans.  Physical Exam  General:  overweight appearing female in NAD  Ears:  External ear exam shows no significant lesions or deformities.  Otoscopic examination reveals clear canals, tympanic membranes are intact bilaterally without bulging, retraction, inflammation or discharge. Hearing is grossly normal bilaterally. Nose:  External nasal examination shows no deformity or inflammation. Nasal mucosa are pink and moist without lesions or exudates. Mouth:  MMM Neck:  no carotid bruit or thyromegaly no cervical or supraclavicular lymphadenopathy  Breasts:  per onc Lungs:  Normal respiratory effort, chest expands symmetrically, mild decrease breath sound in left lower lung. Lungs are clear to auscultation, no crackles or wheezes. Heart:  Normal rate and regular rhythm. S1 and S2 normal without gallop, murmur, click, rub or other extra sounds. Abdomen:  Bowel sounds positive,abdomen soft and non-tender without masses, organomegaly or hernias noted. Genitalia:  not indicated    Pulses:  R and L posterior tibial pulses are full and equal bilaterally  Extremities:  no edmea  Diabetes Management Exam:    Foot Exam (with socks and/or shoes not present):       Sensory-Pinprick/Light touch:          Left medial foot (L-4): normal          Left dorsal foot (L-5): normal          Left lateral foot (S-1): normal          Right medial foot (L-4): normal          Right dorsal foot (L-5): normal          Right lateral foot (S-1): normal       Sensory-Monofilament:          Left foot: normal          Right foot: normal       Inspection:          Left foot: normal          Right foot: normal       Nails:          Left foot: normal          Right foot: normal   Impression & Recommendations:  Problem # 1:  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 not interested in lyrica or gabapentin  Problem # 2:  AODM (ICD-250.00) borderline.. well controlled.  Her updated medication list for this problem includes:    Avapro 150 Mg Tabs (Irbesartan) .Marland Kitchen... 1/2  tab by mouth daily  Problem # 3:  ESSENTIAL HYPERTENSION (ICD-401.9)  Well controlled. Continue current medication.  Her updated medication list for this problem includes:    Hydrochlorothiazide 12.5 Mg Tabs (Hydrochlorothiazide) .Marland Kitchen... Take 1 tablet by mouth once a day    Avapro 150 Mg Tabs (Irbesartan) .Marland Kitchen... 1/2  tab by mouth daily  BP today: 120/84 Prior BP: 130/70 (05/29/2010)  10 Yr Risk Heart Disease: Not enough information Prior 10 Yr Risk Heart Disease: 20 % (01/21/2009)  Labs Reviewed: K+: 3.8 (11/03/2010) Creat: : 0.9 (11/03/2010)   Chol: 219 (11/03/2010)   HDL: 33.00 (11/03/2010)   LDL: DEL (01/16/2009)   TG: 195.0 (11/03/2010)  Problem # 4:  HYPERCHOLESTEROLEMIA (ICD-272.0) Inadequate control.. pt will add fish oil.. she is in middle of beginning met  treatemnet.. will hold off on statin med right now.   Problem # 5:  ADENOCARCINOMA, BREAST (ICD-174.9) Per ONC.   Problem # 6:   ADENOCARCINOMA, COLON (ICD-153.9) Metastatic.      Per ONC.   Complete Medication List: 1)  Hydrochlorothiazide 12.5 Mg Tabs (Hydrochlorothiazide) .... Take 1 tablet by mouth once a day 2)  Multivitamins Tabs (Multiple vitamin) .... Take 1 tablet by mouth once a day 3)  Avapro 150 Mg Tabs (Irbesartan) .... 1/2  tab by mouth daily 4)  Coumadin 2.5 Mg Tabs (Warfarin sodium) .... As directed per hemeonc office 5)  Vitamin B-12 Cr 2000 Mcg Cr-tabs (Cyanocobalamin) .... Take one by mouth daily 6)  Aromasin 25 Mg Tabs (Exemestane) .... Take one by mouth daily  Hypertension Assessment/Plan:      The patient's hypertensive risk group is category C: Target organ damage and/or diabetes.  Today's blood pressure is 120/84.  Her blood pressure goal is < 140/90.  Lipid Assessment/Plan:      Based on NCEP/ATP III, the patient's risk factor category is "history of diabetes".  The patient's lipid goals are as follows: Total cholesterol goal is 200; LDL cholesterol goal is 100; HDL cholesterol goal is 40; Triglyceride goal is 150.  Her LDL cholesterol goal has not been met.    Patient Instructions: 1)  Try to add fish oil liquid or small capsules...2000 mg divided daily.. EPA, DHA, ALA. 2)  Work on [employer].Marland Kitchen try water exercsie. 3)   Work low fat diet, increase veggie fats in place of animal fats.  4)   Increase fiber in diet.  5)  Please schedule a follow-up appointment in 1 year.    Orders Added: 1)  Est. Patient Level IV [16109]    Current Allergies (reviewed today): ! AMOXICILLIN ! ERYTHROMYCIN  Last Colonoscopy:  Normal (05/06/2008 10:39:58 AM) Colonoscopy Result Date:  05/06/2009 Colonoscopy Result:  normal Colonoscopy Next Due:  3 yr Last PAP:  NEGATIVE FOR INTRAEPITHELIAL LESIONS OR MALIGNANCY. (10/17/2008 12:00:00 AM) PAP Next Due:  Not Indicated Mammogram pending per ONC... 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 Shelda Pal in  [place]       1965 breast bx, fibroma removal        left breast cancer, s/p lumpectomy and radiation 1/10       Left lobectomy 7/10 for metastatic colon cancer    Appended Document: CPX/CLE     Clinical Lists Changes  Orders: Added new Service order of Pneumococcal Vaccine (16109) - Signed Added new Service order of Admin 1st Vaccine (60454) - Signed Observations: Added new observation of PNEUMOVAXVIS: 11/10/09 version given November 06, 2010. (11/06/2010 15:11) Added new observation of PNEUMOVAXLOT: 1297aa (11/06/2010 15:11) Added new observation of PNEUMOVAXEXP: 04/01/2012 (11/06/2010 15:11) Added new observation of PNEUMOVAXBY: Heather Woodard CMA (AAMA) (11/06/2010 15:11) Added new observation of PNEUMOVAXRTE: [place] (11/06/2010 15:11) Added new observation of PNEUMOVAXDOS: 0.5 ml (11/06/2010 15:11) Added new observation of PNEUMOVAXMFR: Merck (11/06/2010 15:11) Added new observation of PNEUMOVAXSIT: left deltoid (11/06/2010 15:11) Added new observation of PNEUMOVAX: Pneumovax (11/06/2010 15:11)       Immunizations Administered:  Pneumonia Vaccine:    Vaccine Type: Pneumovax    Site: left deltoid    Mfr: Merck    Dose: 0.5 ml    Route: [place]    Given by: Benny Lennert CMA (AAMA)    Exp. Date: 04/01/2012    Lot #: [id number]    VIS given: 11/10/09 version given November 06, 2010.

## 2011-01-05 NOTE — Letter (Signed)
[facility]   Imported By: Lanelle Bal 02/02/2010 11:42:10  _____________________________________________________________________  External Attachment:    Type:   Image     Comment:   External Document

## 2011-01-05 NOTE — Letter (Signed)
[facility]   Imported By: Lanelle Bal 02/20/2010 08:22:39  _____________________________________________________________________  External Attachment:    Type:   Image     Comment:   External Document

## 2011-01-05 NOTE — Letter (Signed)
[facility]   Imported By: Lanelle Bal 01/13/2010 10:17:56  _____________________________________________________________________  External Attachment:    Type:   Image     Comment:   External Document

## 2011-01-05 NOTE — Letter (Signed)
[facility]   Imported By: Lanelle Bal 03/13/2010 08:13:01  _____________________________________________________________________  External Attachment:    Type:   Image     Comment:   External Document

## 2011-01-05 NOTE — Letter (Signed)
[facility]   Imported By: Lanelle Bal 02/02/2010 11:41:18  _____________________________________________________________________  External Attachment:    Type:   Image     Comment:   External Document

## 2011-01-05 NOTE — Letter (Signed)
[facility]   Imported By: Lanelle Bal 01/13/2010 13:04:01  _____________________________________________________________________  External Attachment:    Type:   Image     Comment:   External Document

## 2011-01-05 NOTE — Letter (Signed)
[facility]   Imported By: Lanelle Bal 02/20/2010 08:37:28  _____________________________________________________________________  External Attachment:    Type:   Image     Comment:   External Document

## 2011-01-05 NOTE — Letter (Signed)
Summary[facility]   Imported By: Maryln Gottron 02/23/2010 11:02:32  _____________________________________________________________________  External Attachment:    Type:   Image     Comment:   External Document

## 2011-01-05 NOTE — Letter (Signed)
[facility]   Imported By: Lanelle Bal 01/06/2010 11:04:49  _____________________________________________________________________  External Attachment:    Type:   Image     Comment:   External Document

## 2011-01-05 NOTE — Letter (Signed)
Summary: [facility]  [facility]   Imported By: Maryln Gottron 10/30/2010 08:19:22  _____________________________________________________________________  External Attachment:    Type:   Image     Comment:   External Document

## 2011-01-05 NOTE — Letter (Signed)
[facility]   Imported By: Lanelle Bal 04/15/2010 10:04:24  _____________________________________________________________________  External Attachment:    Type:   Image     Comment:   External Document

## 2011-01-05 NOTE — Letter (Signed)
[facility]   Imported By: Lanelle Bal 06/18/2010 12:25:14  _____________________________________________________________________  External Attachment:    Type:   Image     Comment:   External Document

## 2011-01-05 NOTE — Letter (Signed)
Summary: [facility]  [facility]   Imported By: Lester Hazelton 11/07/2010 11:32:25  _____________________________________________________________________  External Attachment:    Type:   Image     Comment:   External Document

## 2011-01-05 NOTE — Letter (Signed)
Summary: [facility]  [facility]   Imported By: Lester  11/12/2010 10:05:34  _____________________________________________________________________  External Attachment:    Type:   Image     Comment:   External Document

## 2011-01-07 NOTE — Progress Notes (Signed)
  Phone Note Call from Patient   Caller: Patient Details for Reason: New Coumadin RX Summary of Call: Needs a new RX from her Dr here for Warfarin, 5mg . [facility] [place] on [address] Initial call taken by: Mills Koller,  November 19, 2010 12:04 PM    New/Updated Medications: WARFARIN SODIUM 5 MG TABS (WARFARIN SODIUM) Use as directed by Coumadin clinic Prescriptions: WARFARIN SODIUM 5 MG TABS (WARFARIN SODIUM) Use as directed by Coumadin clinic  #30 x 3   Entered and Authorized by:   Hannah Beat MD   Signed by:   Hannah Beat MD on 11/19/2010   Method used:   [facility]ically to        [id number]       [address]       [place], [place]  [address]       Ph: [phone]       Fax: [phone]   RxID:   [id number]

## 2011-01-07 NOTE — Letter (Signed)
Summary: [facility]  [facility]   Imported By: Lanelle Bal 12/01/2010 10:45:39  _____________________________________________________________________  External Attachment:    Type:   Image     Comment:   External Document

## 2011-01-07 NOTE — Letter (Signed)
Summary: [facility]  [facility]   Imported By: Maryln Gottron 12/28/2010 12:45:31  _____________________________________________________________________  External Attachment:    Type:   Image     Comment:   External Document

## 2011-01-07 NOTE — Letter (Signed)
Summary: Results Follow up Letter  [facility]  [address] [place], [place] [address]   Phone: [phone]  Fax: [phone]    12/01/2010 MRN: [id number]     [facility] [address] [place], [place]  [address]    Dear Susan Duffy,  The following are the results of your recent test(s):  Test         Result    Pap Smear:        Normal _____  Not Normal _____ Comments: ______________________________________________________ Cholesterol: LDL(Bad cholesterol):         Your goal is less than:         HDL (Good cholesterol):       Your goal is more than: Comments:  ______________________________________________________ Mammogram:        Normal __x___  Not Normal _____ Comments:Repeat in 1 year  ___________________________________________________________________ Hemoccult:        Normal _____  Not normal _______ Comments:    _____________________________________________________________________ Other Tests:    We routinely do not discuss normal results over the telephone.  If you desire a copy of the results, or you have any questions about this information we can discuss them at your next office visit.   Sincerely,  [name] MD

## 2011-01-07 NOTE — Progress Notes (Signed)
Summary: Monitor Coumadin  Phone Note Call from Patient Call back at Home Phone [phone]   Caller: Patient Call For: [name] MD Summary of Call: Patient is on Coumadin and Dr. Park Breed has been monitoring this. Patient had talked with you about you monitoring this for her. Patient wants you to  start monitoring this for her and needs to know if you will take this over for her now and what she needs to do?  Patient is aware that Dr. Ermalene Searing is out today. Initial call taken by: Sydell Axon LPN,  November 16, 2010 2:14 PM  Follow-up for Phone Call        yes, our office does this all the time.  have her set up appt with Terri. Hannah Beat MD  November 16, 2010 4:35 PM      Appended Document: Monitor Coumadin Agreed.. set her up in coumadin clinic.   Appended Document: Monitor Coumadin terri is taken care of this.Consuello Masse CMA    Appended Document: Monitor Coumadin Appointment schedule, 12.15.2011, @ 11:20. Mills Koller  November 17, 2010 3:34 PM

## 2011-01-07 NOTE — Letter (Signed)
Summary: [facility]  [facility]   Imported By: Maryln Gottron 12/30/2010 13:14:44  _____________________________________________________________________  External Attachment:    Type:   Image     Comment:   External Document

## 2011-01-13 ENCOUNTER — Ambulatory Visit (INDEPENDENT_AMBULATORY_CARE_PROVIDER_SITE_OTHER): Payer: Medicare Other

## 2011-01-13 ENCOUNTER — Encounter: Payer: Self-pay | Admitting: Family Medicine

## 2011-01-13 DIAGNOSIS — I82409 Acute embolism and thrombosis of unspecified deep veins of unspecified lower extremity: Secondary | ICD-10-CM

## 2011-01-13 DIAGNOSIS — Z5181 Encounter for therapeutic drug level monitoring: Secondary | ICD-10-CM

## 2011-01-13 DIAGNOSIS — Z7901 Long term (current) use of anticoagulants: Secondary | ICD-10-CM

## 2011-01-13 LAB — CONVERTED CEMR LAB: INR: 2.1

## 2011-01-14 ENCOUNTER — Encounter: Payer: Self-pay | Admitting: Family Medicine

## 2011-01-14 ENCOUNTER — Ambulatory Visit: Payer: Medicare Other | Attending: Radiation Oncology | Admitting: Radiation Oncology

## 2011-02-11 NOTE — Letter (Signed)
Summary: [facility]  [facility]   Imported By: Kassie Mends 02/05/2011 11:41:43  _____________________________________________________________________  External Attachment:    Type:   Image     Comment:   External Document

## 2011-02-12 ENCOUNTER — Encounter: Payer: Self-pay | Admitting: Family Medicine

## 2011-02-12 ENCOUNTER — Ambulatory Visit (INDEPENDENT_AMBULATORY_CARE_PROVIDER_SITE_OTHER): Payer: Medicare Other

## 2011-02-12 DIAGNOSIS — Z7901 Long term (current) use of anticoagulants: Secondary | ICD-10-CM

## 2011-02-12 DIAGNOSIS — I82409 Acute embolism and thrombosis of unspecified deep veins of unspecified lower extremity: Secondary | ICD-10-CM

## 2011-02-12 DIAGNOSIS — Z5181 Encounter for therapeutic drug level monitoring: Secondary | ICD-10-CM

## 2011-02-12 LAB — CONVERTED CEMR LAB: INR: 3.1

## 2011-02-16 LAB — APTT: aPTT: 38 seconds — ABNORMAL HIGH (ref 24–37)

## 2011-02-16 LAB — GLUCOSE, CAPILLARY: Glucose-Capillary: 84 mg/dL (ref 70–99)

## 2011-02-16 LAB — PROTIME-INR
INR: 1.17 (ref 0.00–1.49)
Prothrombin Time: 15.1 seconds (ref 11.6–15.2)

## 2011-02-16 LAB — CBC
Hemoglobin: 12.9 g/dL (ref 12.0–15.0)
MCHC: 35.5 g/dL (ref 30.0–36.0)

## 2011-02-16 NOTE — Medication Information (Signed)
Summary: protime/tw   PCP: [name] MD Indication 1: Deep venous thrombosis PT 37.7 INR RANGE 2.0-3.5           Allergies: 1)  ! Amoxicillin 2)  ! Erythromycin  Anticoagulation Management History:      Positive risk factors for bleeding include an age of 67 years or older and presence of serious comorbidities.  The bleeding index is 'intermediate risk'.  Positive CHADS2 values include History of HTN and History of Diabetes.  Negative CHADS2 values include Age > 67 years old.  Her last INR was 2.1 and today's INR is 3.1.  Prothrombin time is 37.7.    Anticoagulation Management Assessment/Plan:      The patient's current anticoagulation dose is Coumadin 2.5 mg tabs: as directed per [facility] office, Warfarin sodium 5 mg tabs: Use as directed by Coumadin clinic.  The next INR is due 4 weeks.        Laboratory Results   Blood Tests   Date/Time Recieved: February 12, 2011 11:35 AM  Date/Time Reported: February 12, 2011 11:35 AM   PT: 37.7 s   (Normal Range: 10.6-13.4)  INR: 3.1   (Normal Range: 0.88-1.12   Therap INR: 2.0-3.5)      ANTICOAGULATION RECORD PREVIOUS REGIMEN & LAB RESULTS Anticoagulation Diagnosis:  Deep venous thrombosis on  11/19/2010 Previous INR Goal Range:  2.0-3.5 on  11/19/2010 Previous INR:  2.1 on  01/13/2011 Previous Coumadin Dose(mg):  5mg  qd, 2.5mg  Sun,T,THUR on  11/19/2010 Previous Regimen:  5mg  qd,2.5mg  T,TH on  12/30/2010 Previous Coagulation Comments:  . on  12/02/2010  NEW REGIMEN & LAB RESULTS Current INR: 3.1 Regimen: 5mg  qd,2.5mg  T,TH  (no change)  Provider: bedsole      Repeat testing in: 4 weeks MEDICATIONS HYDROCHLOROTHIAZIDE 12.5 MG TABS (HYDROCHLOROTHIAZIDE) Take 1 tablet by mouth once a day MULTIVITAMINS   TABS (MULTIPLE VITAMIN) Take 1 tablet by mouth once a day AVAPRO 150 MG TABS (IRBESARTAN) 1/2  tab by mouth daily COUMADIN 2.5 MG TABS (WARFARIN SODIUM) as directed per [facility] office VITAMIN B-12 CR 2000 MCG CR-TABS  (CYANOCOBALAMIN) Take one by mouth daily AROMASIN 25 MG TABS (EXEMESTANE) Take one by mouth daily WARFARIN SODIUM 5 MG TABS (WARFARIN SODIUM) Use as directed by Coumadin clinic  Dose has been reviewed with patient or caretaker during this visit.  Reviewed by: Allison Quarry  Anticoagulation Visit Questionnaire      Coumadin dose missed/changed:  No      Abnormal Bleeding Symptoms:  No Any diet changes including alcohol intake, vegetables or greens since the last visit:  No Any illnesses or hospitalizations since the last visit:  No Any signs of clotting since the last visit (including chest discomfort, dizziness, shortness of breath, arm tingling, slurred speech, swelling or redness in leg):  No

## 2011-02-21 LAB — GLUCOSE, CAPILLARY: Glucose-Capillary: 121 mg/dL — ABNORMAL HIGH (ref 70–99)

## 2011-02-22 ENCOUNTER — Other Ambulatory Visit: Payer: Self-pay | Admitting: Oncology

## 2011-02-22 ENCOUNTER — Encounter (HOSPITAL_BASED_OUTPATIENT_CLINIC_OR_DEPARTMENT_OTHER): Payer: Medicare Other | Admitting: Oncology

## 2011-02-22 DIAGNOSIS — C189 Malignant neoplasm of colon, unspecified: Secondary | ICD-10-CM

## 2011-02-22 DIAGNOSIS — Z7901 Long term (current) use of anticoagulants: Secondary | ICD-10-CM

## 2011-02-22 DIAGNOSIS — C50919 Malignant neoplasm of unspecified site of unspecified female breast: Secondary | ICD-10-CM

## 2011-02-22 DIAGNOSIS — D0590 Unspecified type of carcinoma in situ of unspecified breast: Secondary | ICD-10-CM

## 2011-02-24 ENCOUNTER — Other Ambulatory Visit: Payer: Self-pay | Admitting: Family Medicine

## 2011-02-25 ENCOUNTER — Encounter (HOSPITAL_COMMUNITY): Payer: Self-pay

## 2011-02-25 ENCOUNTER — Ambulatory Visit (HOSPITAL_COMMUNITY)
Admission: RE | Admit: 2011-02-25 | Discharge: 2011-02-25 | Disposition: A | Discharge status: Home / Self Care | Payer: Medicare Other | Source: Ambulatory Visit | Attending: Oncology | Admitting: Oncology

## 2011-02-25 ENCOUNTER — Encounter (HOSPITAL_BASED_OUTPATIENT_CLINIC_OR_DEPARTMENT_OTHER): Payer: Medicare Other | Admitting: Oncology

## 2011-02-25 ENCOUNTER — Other Ambulatory Visit: Payer: Self-pay | Admitting: Oncology

## 2011-02-25 ENCOUNTER — Encounter (HOSPITAL_COMMUNITY)
Admission: RE | Admit: 2011-02-25 | Discharge: 2011-02-25 | Disposition: A | Discharge status: Home / Self Care | Payer: Medicare Other | Source: Ambulatory Visit | Attending: Oncology | Admitting: Oncology

## 2011-02-25 DIAGNOSIS — C779 Secondary and unspecified malignant neoplasm of lymph node, unspecified: Secondary | ICD-10-CM

## 2011-02-25 DIAGNOSIS — C50919 Malignant neoplasm of unspecified site of unspecified female breast: Secondary | ICD-10-CM

## 2011-02-25 DIAGNOSIS — D0590 Unspecified type of carcinoma in situ of unspecified breast: Secondary | ICD-10-CM | POA: Insufficient documentation

## 2011-02-25 DIAGNOSIS — N852 Hypertrophy of uterus: Secondary | ICD-10-CM | POA: Insufficient documentation

## 2011-02-25 DIAGNOSIS — C189 Malignant neoplasm of colon, unspecified: Secondary | ICD-10-CM | POA: Insufficient documentation

## 2011-02-25 DIAGNOSIS — Q619 Cystic kidney disease, unspecified: Secondary | ICD-10-CM | POA: Insufficient documentation

## 2011-02-25 DIAGNOSIS — Z9221 Personal history of antineoplastic chemotherapy: Secondary | ICD-10-CM | POA: Insufficient documentation

## 2011-02-25 DIAGNOSIS — Z923 Personal history of irradiation: Secondary | ICD-10-CM | POA: Insufficient documentation

## 2011-02-25 DIAGNOSIS — Z79899 Other long term (current) drug therapy: Secondary | ICD-10-CM | POA: Insufficient documentation

## 2011-02-25 DIAGNOSIS — Z7901 Long term (current) use of anticoagulants: Secondary | ICD-10-CM | POA: Insufficient documentation

## 2011-02-25 LAB — COMPREHENSIVE METABOLIC PANEL
ALT: 26 U/L (ref 0–35)
AST: 25 U/L (ref 0–37)
CO2: 28 mEq/L (ref 19–32)
Sodium: 139 mEq/L (ref 135–145)
Total Bilirubin: 0.8 mg/dL (ref 0.3–1.2)
Total Protein: 8.8 g/dL — ABNORMAL HIGH (ref 6.0–8.3)

## 2011-02-25 LAB — GLUCOSE, CAPILLARY: Glucose-Capillary: 106 mg/dL — ABNORMAL HIGH (ref 70–99)

## 2011-02-25 MED ORDER — IOHEXOL 300 MG/ML  SOLN
100.0000 mL | Freq: Once | INTRAMUSCULAR | Status: AC | PRN
  Administered 2011-02-25: 100 mL via INTRAVENOUS

## 2011-02-25 MED ORDER — FLUDEOXYGLUCOSE F - 18 (FDG) INJECTION
17.3000 | Freq: Once | INTRAVENOUS | Status: AC | PRN
  Administered 2011-02-25: 17.3 via INTRAVENOUS

## 2011-03-02 ENCOUNTER — Inpatient Hospital Stay (HOSPITAL_COMMUNITY)
Admission: EM | Admit: 2011-03-02 | Discharge: 2011-03-04 | DRG: 069 | Disposition: A | Discharge status: Home / Self Care | Payer: Medicare Other | Attending: Internal Medicine | Admitting: Internal Medicine

## 2011-03-02 ENCOUNTER — Other Ambulatory Visit: Payer: Self-pay | Admitting: Oncology

## 2011-03-02 ENCOUNTER — Emergency Department (HOSPITAL_COMMUNITY): Payer: Medicare Other

## 2011-03-02 ENCOUNTER — Encounter (HOSPITAL_BASED_OUTPATIENT_CLINIC_OR_DEPARTMENT_OTHER): Payer: Medicare Other | Admitting: Oncology

## 2011-03-02 DIAGNOSIS — Z7901 Long term (current) use of anticoagulants: Secondary | ICD-10-CM

## 2011-03-02 DIAGNOSIS — C801 Malignant (primary) neoplasm, unspecified: Secondary | ICD-10-CM | POA: Diagnosis present

## 2011-03-02 DIAGNOSIS — Z9049 Acquired absence of other specified parts of digestive tract: Secondary | ICD-10-CM

## 2011-03-02 DIAGNOSIS — R42 Dizziness and giddiness: Secondary | ICD-10-CM | POA: Diagnosis present

## 2011-03-02 DIAGNOSIS — C50919 Malignant neoplasm of unspecified site of unspecified female breast: Secondary | ICD-10-CM

## 2011-03-02 DIAGNOSIS — C189 Malignant neoplasm of colon, unspecified: Secondary | ICD-10-CM

## 2011-03-02 DIAGNOSIS — C779 Secondary and unspecified malignant neoplasm of lymph node, unspecified: Secondary | ICD-10-CM

## 2011-03-02 DIAGNOSIS — Z883 Allergy status to other anti-infective agents status: Secondary | ICD-10-CM

## 2011-03-02 DIAGNOSIS — E860 Dehydration: Secondary | ICD-10-CM

## 2011-03-02 DIAGNOSIS — R29898 Other symptoms and signs involving the musculoskeletal system: Secondary | ICD-10-CM | POA: Diagnosis present

## 2011-03-02 DIAGNOSIS — R319 Hematuria, unspecified: Secondary | ICD-10-CM | POA: Diagnosis present

## 2011-03-02 DIAGNOSIS — G459 Transient cerebral ischemic attack, unspecified: Principal | ICD-10-CM | POA: Diagnosis present

## 2011-03-02 DIAGNOSIS — I82509 Chronic embolism and thrombosis of unspecified deep veins of unspecified lower extremity: Secondary | ICD-10-CM | POA: Diagnosis present

## 2011-03-02 DIAGNOSIS — Z79899 Other long term (current) drug therapy: Secondary | ICD-10-CM

## 2011-03-02 DIAGNOSIS — D0590 Unspecified type of carcinoma in situ of unspecified breast: Secondary | ICD-10-CM

## 2011-03-02 DIAGNOSIS — E785 Hyperlipidemia, unspecified: Secondary | ICD-10-CM | POA: Diagnosis present

## 2011-03-02 DIAGNOSIS — Z880 Allergy status to penicillin: Secondary | ICD-10-CM

## 2011-03-02 DIAGNOSIS — I10 Essential (primary) hypertension: Secondary | ICD-10-CM | POA: Diagnosis present

## 2011-03-02 LAB — CK TOTAL AND CKMB (NOT AT ARMC): Total CK: 58 U/L (ref 7–177)

## 2011-03-02 LAB — COMPREHENSIVE METABOLIC PANEL
ALT: 22 U/L (ref 0–35)
ALT: 25 U/L (ref 0–35)
ALT: 25 U/L (ref 0–35)
AST: 26 U/L (ref 0–37)
AST: 23 U/L (ref 0–37)
Albumin: 4.2 g/dL (ref 3.5–5.2)
Albumin: 3.6 g/dL (ref 3.5–5.2)
Albumin: 3.6 g/dL (ref 3.5–5.2)
Alkaline Phosphatase: 117 U/L (ref 39–117)
Alkaline Phosphatase: 112 U/L (ref 39–117)
Alkaline Phosphatase: 98 U/L (ref 39–117)
BUN: 34 mg/dL — ABNORMAL HIGH (ref 6–23)
Chloride: 104 mEq/L (ref 96–112)
GFR calc non Af Amer: >60 mL/min (ref >60)
Glucose, Bld: 128 mg/dL — ABNORMAL HIGH (ref 70–99)
Potassium: 3.8 mEq/L (ref 3.5–5.3)
Potassium: 3.8 mEq/L (ref 3.5–5.1)
Sodium: 138 mEq/L (ref 135–145)
Sodium: 138 mEq/L (ref 135–145)
Total Bilirubin: 0.4 mg/dL (ref 0.3–1.2)
Total Bilirubin: 0.4 mg/dL (ref 0.3–1.2)
Total Bilirubin: 0.9 mg/dL (ref 0.3–1.2)
Total Protein: 8.5 g/dL — ABNORMAL HIGH (ref 6.0–8.3)
Total Protein: 7.9 g/dL (ref 6.0–8.3)

## 2011-03-02 LAB — URINE MICROSCOPIC-ADD ON

## 2011-03-02 LAB — PROTIME-INR: Prothrombin Time: 22.2 seconds — ABNORMAL HIGH (ref 11.6–15.2)

## 2011-03-02 LAB — DIFFERENTIAL
Basophils Relative: 0 % (ref 0–1)
Eosinophils Absolute: 0 10*3/uL (ref 0.0–0.7)
Eosinophils Relative: 0 % (ref 0–5)
Lymphs Abs: 0.4 10*3/uL — ABNORMAL LOW (ref 0.7–4.0)
Monocytes Absolute: 0.4 10*3/uL (ref 0.1–1.0)
Monocytes Relative: 7 % (ref 3–12)

## 2011-03-02 LAB — TROPONIN I: Troponin I: 0.06 ng/mL (ref 0.00–0.06)

## 2011-03-02 LAB — CBC WITH DIFFERENTIAL/PLATELET
BASO%: 0.4 % (ref 0.0–2.0)
Basophils Absolute: 0 10*3/uL (ref 0.0–0.1)
EOS%: 1.2 % (ref 0.0–7.0)
HCT: 38.5 % (ref 34.8–46.6)
LYMPH%: 9.9 % — ABNORMAL LOW (ref 14.0–49.7)
MCH: 32.7 pg (ref 25.1–34.0)
MCHC: 34 g/dL (ref 31.5–36.0)
MCV: 96 fL (ref 79.5–101.0)
MONO%: 9.6 % (ref 0.0–14.0)
NEUT%: 78.9 % — ABNORMAL HIGH (ref 38.4–76.8)
Platelets: 216 10*3/uL (ref 145–400)
lymph#: 0.6 10*3/uL — ABNORMAL LOW (ref 0.9–3.3)

## 2011-03-02 LAB — URINALYSIS, ROUTINE W REFLEX MICROSCOPIC
Bilirubin Urine: NEGATIVE
Ketones, ur: NEGATIVE mg/dL
Specific Gravity, Urine: 1.019 (ref 1.005–1.030)
pH: 5 (ref 5.0–8.0)

## 2011-03-02 LAB — CARDIAC PANEL(CRET KIN+CKTOT+MB+TROPI)
Relative Index: INVALID (ref 0.0–2.5)
Total CK: 56 U/L (ref 7–177)
Troponin I: 0.16 ng/mL — ABNORMAL HIGH (ref 0.00–0.06)

## 2011-03-02 LAB — CBC
MCH: 32.9 pg (ref 26.0–34.0)
MCH: 32.5 pg (ref 26.0–34.0)
MCHC: 33.8 g/dL (ref 30.0–36.0)
MCHC: 33.5 g/dL (ref 30.0–36.0)
MCV: 97.3 fL (ref 78.0–100.0)
MCV: 97.1 fL (ref 78.0–100.0)
Platelets: 195 10*3/uL (ref 150–400)
Platelets: 208 10*3/uL (ref 150–400)
RBC: 3.75 MIL/uL — ABNORMAL LOW (ref 3.87–5.11)
RDW: 13.1 % (ref 11.5–15.5)

## 2011-03-02 LAB — APTT: aPTT: 38 seconds — ABNORMAL HIGH (ref 24–37)

## 2011-03-02 LAB — SEDIMENTATION RATE: Sed Rate: 56 mm/hr — ABNORMAL HIGH (ref 0–22)

## 2011-03-03 DIAGNOSIS — I6789 Other cerebrovascular disease: Secondary | ICD-10-CM

## 2011-03-03 DIAGNOSIS — R42 Dizziness and giddiness: Secondary | ICD-10-CM

## 2011-03-03 LAB — LIPID PANEL
Cholesterol: 197 mg/dL (ref 0–200)
HDL: 19 mg/dL — ABNORMAL LOW (ref >39)
LDL Cholesterol: 146 mg/dL — ABNORMAL HIGH (ref 0–99)
Total CHOL/HDL Ratio: 10.4 RATIO
Triglycerides: 161 mg/dL — ABNORMAL HIGH (ref <150)

## 2011-03-03 LAB — GLUCOSE, CAPILLARY

## 2011-03-03 LAB — PROTIME-INR: Prothrombin Time: 22.2 seconds — ABNORMAL HIGH (ref 11.6–15.2)

## 2011-03-04 ENCOUNTER — Encounter: Payer: Self-pay | Admitting: Family Medicine

## 2011-03-04 LAB — BASIC METABOLIC PANEL
CO2: 28 mEq/L (ref 19–32)
Calcium: 9.3 mg/dL (ref 8.4–10.5)
GFR calc Af Amer: >60 mL/min (ref >60)
GFR calc non Af Amer: 56 mL/min — ABNORMAL LOW (ref >60)
Potassium: 3.6 mEq/L (ref 3.5–5.1)
Sodium: 139 mEq/L (ref 135–145)

## 2011-03-04 LAB — PROTIME-INR: INR: 2.28 — ABNORMAL HIGH (ref 0.00–1.49)

## 2011-03-04 LAB — CBC
Hemoglobin: 11.5 g/dL — ABNORMAL LOW (ref 12.0–15.0)
MCHC: 32.5 g/dL (ref 30.0–36.0)
RDW: 13.6 % (ref 11.5–15.5)
WBC: 6.3 10*3/uL (ref 4.0–10.5)

## 2011-03-04 LAB — GLUCOSE, CAPILLARY: Glucose-Capillary: 98 mg/dL (ref 70–99)

## 2011-03-04 NOTE — Discharge Summary (Signed)
Susan Duffy, AUER         ACCOUNT NO.:  [id number]  MEDICAL RECORD NO.:  [id number]           PATIENT TYPE:  I  LOCATION:  1425                         FACILITY:  [facility]  PHYSICIAN:  Osvaldo Shipper, MD     DATE OF BIRTH:  06/23/1946  DATE OF ADMISSION:  03/02/2011 DATE OF DISCHARGE:  03/04/2011                              DISCHARGE SUMMARY   PRIMARY CARE PHYSICIAN:  The patient's primary care physician Dr. [name] with [facility].  CONSULTATIONS:  No consultations obtained during this admission.  IMAGING STUDIES:  Imaging studies done during this admission include the following: 1. CT of the head which showed no acute intracranial process, patchy     white matter hypodensity representing small vessel ischemia was     seen. 2. MRI of the brain was done which showed no acute findings. 3. MRA of head showed normal intracranial MR angiogram. 4. The patient had an echocardiogram which showed EF 65% to 70%.  Wall     motion was normal.  Grade 1 diastolic dysfunction was noted. 5. The patient had carotid Dopplers which preliminarily does not show     any kind of obstruction or stenosis.  LABORATORY DATA:  Pertinent labs include ESR of 56 when she was admitted.  INR when she was admitted was 1.93, today is 2.28.  Her LDL was 146, HDL was 19, triglycerides 161, total cholesterol 197.  Her UA showed large blood, few leukocytes, few bacteria.  EKG was done which showed normal sinus rhythm with possible left axis deviation, no definite Q-waves, no concerning ST changes, nonspecific T- wave changes were noted.  The patient also had a troponin to 0.16, however, the other markers were all normal.  HbA1c was 5.5.  DISCHARGE DIAGNOSES: 1. Transient ischemic attack with transient left lower extremity     weakness and dizziness, resolved. 2. History of hypertension, stable. 3. Hyperlipidemia, initiated simvastatin. 4. Minimal elevation in troponin with normal CK. 5.  Metastatic colon cancer. 6. History of deep vein thrombosis, on Coumadin.  BRIEF HOSPITAL COURSE:  Briefly, this is a 65 year old Caucasian female who presented to the hospital with complaints of dizziness.  She also had sudden onset of left lower extremity weakness.  She denied any chest pain or palpitations.  She had some vision changes.  These symptoms lasted about 5 minutes and they resolved.  The patient underwent evaluation in the ED and then subsequently was admitted for further workup. 1. TIA.  The symptoms appeared to be consistent with TIA.  Evaluation     was done in the hospital all of which have revealed no concerning     findings that require immediate attention.  She was found to have     elevated LDL for which we put her on simvastatin.  The patient will     also be sent home on baby aspirin along with her Coumadin for now.     She will be asked to follow up with her primary care physician and     she has an appointment is Monday. 2. History of DVT.  Her INR was subtherapeutic when she came in.  She  was put on Coumadin and INR is subtherapeutic now. 3. She has history of metastatic colon cancer for which she can follow     up with her oncologist. 4. Hematuria was noted at the time of admission.  However, she did     have large blood but had only 3 to 6 RBCs.  CK was normal.  No     gross hematuria is noted and the patient will be asked to follow up     with her PCP. 5. Hypertension.  She has been stable.  We have actually gone ahead     and discontinued her HCTZ as the blood pressures here have been on     the lower side.  She was continued on the Avapro 6. Hyperlipidemia, please see above.  Simvastatin will be initiated.     Side effects have been discussed. 7. Minimal elevation in troponin.  An echocardiogram was obtained     which showed no wall motion abnormalities.  EKG did not show any     acute findings.  Because of the normal echocardiogram, she does not      require any further inpatient management.  However, we recommend      an outpatient cardiology evaluation. Review of her previous records show that patient did have a negative stress test in 2009 and was sen in f/u in 2010 with an ECHO with results quite similar to the one during this admission. She was seen by [facility].  Rest of her medical issues are all stable.  On the day of discharge, the patient was feeling well.  Denies any new complaints.  Does not have any more dizziness or lightheadedness.  No weakness. VITAL SIGNS:  Vital signs showed that her temperature was 97.4, heart rate 53, respiratory rate 18, blood pressure 108/65, saturation 98% on room air. LUNGS:  Her lungs were clear to auscultation bilaterally.  No wheezing, rales, or rhonchi. CARDIOVASCULAR:  S1, S2 is normal.  Regular.  No S3, S4 rubs, murmurs or bruits. ABDOMEN:  Soft, nontender, nondistended.  Bowel sounds are present.  No masses or organomegaly is appreciated. NEUROLOGIC:  Neurologically, she is alert and oriented x3.  No focal neurological deficits are present. SKIN:  Does not reveal any new rashes.  ASSESSMENT AND PLAN:  As per above.  DISCHARGE MEDICATIONS: 1. Aspirin 81 mg daily. 2. Simvastatin 20 mg daily. 3. Aromasin 25 mg daily with meals. 4. Avapro 150 mg half tablet daily. 5. Compazine 10 mg every 6 hours as needed for nausea. 6. Coumadin 5 mg as instructed. 7. EMLA application around the port access as needed. 8. Fish oil 1000 mg daily. 9. Imodium 2 mg as instructed. 10.Tylenol Extra Strength 500 mg 1 to 2 tablets every 6 hours as     needed. 11.Vitamin B12 one tablet p.o. daily. 12.Zofran 8 mg every 12 hours as before.  FOLLOWUP:  She has an appointment with Dr. Ermalene Searing on March 08, 2011.  [facility] will call for appointment.  RECOMMENDATIONS TO PCP: 1. Please check PT/INR on 03/08/11 to ensure that INR is therapeutic. 2. Referral to Neurology will be deferred to  PCP.  DIET:  Heart healthy.  Physical activity, she has been cleared by PT, OT here in the hospital. She may resume her usual level of activities except for heavy lifting.  TOTAL TIME ON THIS DISCHARGE ENCOUNTER:  35 minutes.   Osvaldo Shipper, MD     GK/MEDQ  D:  03/04/2011  T:  03/04/2011  Job:  [id number]  cc:   [name], MD  Electronically Signed by Osvaldo Shipper MD on 03/04/2011 02:53:32 PM

## 2011-03-05 ENCOUNTER — Telehealth: Payer: Self-pay | Admitting: Cardiovascular Disease

## 2011-03-05 NOTE — Telephone Encounter (Signed)
Pt was d/c from [facility] for a TIA.  Can the pt resume Chemo?  This was a [facility] pt.

## 2011-03-05 NOTE — H&P (Addendum)
Susan Duffy, Susan Duffy         ACCOUNT NO.:  [id number]  MEDICAL RECORD NO.:  [id number]           PATIENT TYPE:  E  LOCATION:  WLED                         FACILITY:  [facility]  PHYSICIAN:  Gwenyth Bender, NP      DATE OF BIRTH:  01/07/1946  DATE OF ADMISSION:  03/02/2011 DATE OF DISCHARGE:                             HISTORY & PHYSICAL   PRIMARY CARE PROVIDER:  [name], MD, with [facility].  CHIEF COMPLAINT:  Dizziness.  HISTORY OF PRESENT ILLNESS:  Susan Duffy is a very pleasant 65 year old female with a history of metastatic colon CA, hypertension, hyperlipidemia, DVT, who presents to the [facility] ED from chemotherapy with chief complaint of dizziness.  Information is obtained from the patient.  She reports that she had just started her chemotherapy session this morning when she experienced a sudden rush of dizziness.  She denies a headache or any chest pain or palpitations. She states that she then became "tingling all over" and that she experienced vision changes, specifically "she saw parts of shapes and pieces of light."  She also reports that she felt clammy and slightly nauseated but did not vomit.  She was then unable to move her left leg. All the above listed symptoms lasted 3 to 5 minutes and are currently resolved.  She denies any recent illnesses.  She does report that she had a mechanical fall about 2 weeks ago but sustained no injury more severe than abrasion on her right elbow and her right knee.  She was sent to the [facility] emergency room from the chemotherapy department. CT of the head was concerning for ischemia.  We are asked to admit for further evaluation and treatment.  Symptoms came on suddenly, are resolved.  Nothing made better or worse or characterized as moderate to severe.  ALLERGIES: 1. AMOXICILLIN. 2. ERYTHROMYCIN.  PAST MEDICAL HISTORY: 1. DVT. 2. Hypertension. 3. Metastatic colon cancer. 4. Breast cancer. 5. Lung  mass.  PAST SURGICAL HISTORY: 1. Hysterectomy. 2. Colectomy. 3. Left lobectomy.  FAMILY MEDICAL HISTORY:  Father deceased from an MI at age 57.  Mother deceased in her 77s of unknown cause.  SOCIAL HISTORY:  The patient is a widow for 2 years.  She had been married for 41 years prior to that.  She has three children.  She is a retired fourth Psychiatrist.  She recently moved to [place] from [place].  She denies tobacco, denies EtOH, and denies drugs.  MEDICATIONS: 1. Vitamin B12 1000 mcg p.o. 1 tablet daily. 2. Hydrochlorothiazide 12.5 mg p.o. 1 tablet daily. 3. Fish oil 1000 mg p.o. one cap daily. 4. Lidocaine/prilocaine topical one application daily as needed. 5. Coumadin 5 mg p.o., take half a tablet on Tuesday and Thursday and     a whole tablet on other days. 6. Compazine 10 mg p.o. 1 tablet every 6 hours as needed for nausea. 7. Aromasin 25 mg p.o. daily with meal. 8. Avapro 150 mg p.o. 0.5 tablet daily. 9. Zofran 8 mg p.o. 1 tablet every 12 hours. 10.Imodium 2 mg p.o. 1 to 2 tablets, take 2 tablets at onset of loose  stool and 1 tablet thereafter after each loose stool up to maximal     of 4 doses. 11.Tylenol Extra Strength 500 mg 1 to 2 tablets every 6 hours as     needed for pain.  REVIEW OF SYSTEMS:  GENERAL:  Negative for fever, chills, anorexia, unintentional weight loss. ENT:  Negative for ear pain, nasal congestion, sore throat. CVS:  Negative for chest pain, palpitations, lower extremity edema. RESPIRATORY:  Negative for increased work of breathing or cough. MUSCULOSKELETAL:  Positive for some soreness on the right elbow and right knee otherwise negative for joint pain, swelling or muscle weakness. NEURO:  See HPI. GI:  Negative for abdominal pain, vomiting, constipation, diarrhea or melena. GU:  Negative for dysuria, frequency or urgency. PSYCH:  Negative for depression, anxiety. HEME:  Negative for any unusual bruising or  bleeding.  LABORATORY DATA:  WBC 6.5, hemoglobin 12.0, hematocrit 35.5, neutrophils 87%, absolute neutrophils 5.7.  PT 22.2, INR 1.93.  Sodium 138, potassium 3.8, chloride 104, CO2 25, BUN 34, creatinine 0.93, glucose 128.  Total CK 58, CK-MB 2.6, troponin I 0.06.  ESR 56.  Urinalysis: Large blood, few bacteria, 3-6 RBCs, 3-6 WBCs.  RADIOLOGY:  CT of the head without contrast yields no acute intracranial findings.  Patchy white-matter hypodensity, likely represents a small- vessel ischemia.  Much less likely demyelinating process.  PHYSICAL EXAM:  VITAL SIGNS:  T 97.8, BP 144/88, heart rate 70, respiration 18, saturation 95% on room air. GENERAL:  Awake, alert, well-nourished, well-hydrated, in no acute distress. HEENT:  Head normocephalic, atraumatic.  Pupils equal, round, reactive to light.  EOMI.  Mucous membranes of her mouth are moist and pink.  No obvious lesion or exudate in her nose or ears. NECK:  Supple.  No JVD.  Full range of motion.  No lymphadenopathy. CARDIOVASCULAR:  Regular rate and rhythm.  No murmur, gallop or rub. Trace lower extremity edema.  Pedal pulses present and palpable. RESPIRATORY:  No increased work of breathing.  Breath sounds clear but slightly distant particularly on the left.  No rhonchi, no wheezes and rales. ABDOMEN:  Round, soft, positive bowel sounds throughout, nontender to palpation.  No mass or organomegaly noted. NEUROLOGIC:  Alert and oriented x3.  Cranial nerves II-XII grossly intact.  Speech clear.  Facial symmetry. MUSCULOSKELETAL:  Moves all extremities.  No joint swelling/erythema. 5/5 upper extremity strength, 5/5 lower extremity strength.  Extremities without clubbing or cyanosis.  ASSESSMENT AND PLAN: 1. Stroke versus transient ischemic attack.  CT concerning for     ischemia, but symptoms have resolved.  Will admit to medical     telemetry.  We will workup as a stroke, specifically get an MRI,     MRA of the brain, 2-D echo,  carotid Dopplers, will cycle cardiac     enzymes, check fasting lipid panel, hemoglobin A1c.  Will give     aspirin and continue Coumadin per pharmacy.  Will keep n.p.o. until     she passes the bedside swallow evaluation.  Will hold     hydrochlorothiazide and continue her ARB.  Will monitor closely. 2. Hematuria per urinalysis:  Will check urine myoglobin. 3. Hypertension:  Blood pressure is 144/88.  Will hold     hydrochlorothiazide, continue her ARB.  We will monitor closely. 4. Hyperlipidemia:  Check a fasting lipid panel.  Continue her home     meds. 5. Metastatic colon cancer:  Will hold her Aromasin for now. 6. Deep venous thrombosis prophylaxis:  The patient  is already on     Coumadin.  Will continue per pharmacy. 7. Code status:  The patient is a Full Code.  This assessment and plan was discussed with Dr. Isidoro Donning.  It was truly a pleasure taking care of Susan Duffy.  Dictated For:  Thad Ranger, MD     Gwenyth Bender, NP     KMB/MEDQ  D:  03/02/2011  T:  03/02/2011  Job:  [id number]  cc:   [name], MD  Electronically Signed by Toya Smothers  on 03/05/2011 01:53:31 PM Electronically Signed by Andres Labrum Elowen Debruyn  on 04/22/2011 01:26:38 PM

## 2011-03-08 ENCOUNTER — Ambulatory Visit (INDEPENDENT_AMBULATORY_CARE_PROVIDER_SITE_OTHER): Payer: Medicare Other | Admitting: Family Medicine

## 2011-03-08 ENCOUNTER — Encounter: Payer: Self-pay | Admitting: Family Medicine

## 2011-03-08 VITALS — BP 130/80 | HR 72 | Temp 98.8°F | Ht 63.5 in | Wt 188.5 lb

## 2011-03-08 DIAGNOSIS — Z5181 Encounter for therapeutic drug level monitoring: Secondary | ICD-10-CM

## 2011-03-08 DIAGNOSIS — R778 Other specified abnormalities of plasma proteins: Secondary | ICD-10-CM | POA: Insufficient documentation

## 2011-03-08 DIAGNOSIS — I82409 Acute embolism and thrombosis of unspecified deep veins of unspecified lower extremity: Secondary | ICD-10-CM

## 2011-03-08 DIAGNOSIS — Z8673 Personal history of transient ischemic attack (TIA), and cerebral infarction without residual deficits: Secondary | ICD-10-CM | POA: Insufficient documentation

## 2011-03-08 DIAGNOSIS — C189 Malignant neoplasm of colon, unspecified: Secondary | ICD-10-CM

## 2011-03-08 DIAGNOSIS — Z7901 Long term (current) use of anticoagulants: Secondary | ICD-10-CM

## 2011-03-08 DIAGNOSIS — R7989 Other specified abnormal findings of blood chemistry: Secondary | ICD-10-CM

## 2011-03-08 DIAGNOSIS — G459 Transient cerebral ischemic attack, unspecified: Secondary | ICD-10-CM

## 2011-03-08 HISTORY — DX: Transient cerebral ischemic attack, unspecified: G45.9

## 2011-03-08 LAB — POCT INR: INR: 2.8

## 2011-03-08 NOTE — Patient Instructions (Addendum)
F/u 3 months with Dr. Ermalene Searing,   Continue current dose, check in 2 weeks

## 2011-03-08 NOTE — Progress Notes (Signed)
65 year old female:  Hosp, 03/02/2011 - 03/04/2011  TIA: 5 mins of symptoms with normal carotids and normal echo (grade 1 diastolic dysfunction only) Lost complete feeling in her leg Was having a chemo treatment at the time Started on some cholesterol meds Started on 81 mg ASA  HTN, HCTZ help, avapro continued - 130/80 after d/c HCTZ  Elevated troponins in hospital. Normal CK.  Had lung surgery, and ever since then has had some shortness of breath with exercise Had lung surgery 06/2010. 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   Check PT/INR.  Discharge Summary: DATE OF ADMISSION:  03/02/2011 DATE OF DISCHARGE:  03/04/2011  IMAGING STUDIES:  Imaging studies done during this admission include the following: 1. CT of the head which showed no acute intracranial process, patchy     white matter hypodensity representing small vessel ischemia was     seen. 2. MRI of the brain was done which showed no acute findings. 3. MRA of head showed normal intracranial MR angiogram. 4. The patient had an echocardiogram which showed EF 65% to 70%.  Wall     motion was normal.  Grade 1 diastolic dysfunction was noted. 5. The patient had carotid Dopplers which preliminarily does not show     any kind of obstruction or stenosis.  EKG was done which showed normal sinus rhythm with possible left axis deviation, no definite Q-waves, no concerning ST changes, nonspecific T- wave changes were noted.  The patient also had a troponin to 0.16, however, the other markers were all normal.  HbA1c was 5.5.  DISCHARGE DIAGNOSES: 1. Transient ischemic attack with transient left lower extremity     weakness and dizziness, resolved. 2. History of hypertension, stable. 3. Hyperlipidemia, initiated simvastatin. 4. Minimal elevation in troponin with normal CK. 5.  Metastatic colon cancer. 6. History of deep vein thrombosis, on Coumadin.  ROS: GEN: No acute illnesses, no fevers, chills. Ongoing colon CA mets, set to start chemo next week GI: No n/v/d, eating normally Pulm: No SOB Interactive and getting along well at home.  Otherwise, ROS is as per the HPI.   The PMH, PSH, Social History, Family History, Medications, and allergies have been [facility] in [facility], and have been updated if relevant.   GEN: WDWN, NAD, Non-toxic, A & O x 3 HEENT: Atraumatic, Normocephalic. Neck supple. No masses, No LAD. Ears and Nose: No external deformity. CV: RRR, No M/G/R. No JVD. No thrill. No extra heart sounds. PULM: CTA B, no wheezes, crackles, rhonchi. No retractions. No resp. distress. No accessory muscle use. EXTR: No c/c/e NEURO Normal gait.  PSYCH: Normally interactive. Conversant. Not depressed or anxious appearing.  Calm demeanor.

## 2011-03-08 NOTE — Assessment & Plan Note (Signed)
Consult pending, which seems reasonable. Even if + with CAD, this patient would seem to be more ideal for medical management with other co-morbid problems

## 2011-03-08 NOTE — Assessment & Plan Note (Signed)
Check INR 

## 2011-03-08 NOTE — Assessment & Plan Note (Signed)
TIA with normal echo and carotids Cont anticoag Good BP control and statin

## 2011-03-08 NOTE — Telephone Encounter (Signed)
Spoke to pt, she saw her PCP today and was told this is ok to restart chemo.

## 2011-03-08 NOTE — Progress Notes (Signed)
Addended byMills Koller on: 03/08/2011 02:23 PM   Modules accepted: Orders, Level of Service

## 2011-03-09 ENCOUNTER — Other Ambulatory Visit: Payer: Self-pay | Admitting: *Deleted

## 2011-03-09 ENCOUNTER — Telehealth: Payer: Self-pay | Admitting: Radiology

## 2011-03-09 DIAGNOSIS — Z5181 Encounter for therapeutic drug level monitoring: Secondary | ICD-10-CM

## 2011-03-09 DIAGNOSIS — Z7901 Long term (current) use of anticoagulants: Secondary | ICD-10-CM

## 2011-03-09 DIAGNOSIS — I82409 Acute embolism and thrombosis of unspecified deep veins of unspecified lower extremity: Secondary | ICD-10-CM

## 2011-03-09 MED ORDER — WARFARIN SODIUM 5 MG PO TABS: 5.0000 mg | ORAL_TABLET | Freq: Every day | ORAL | Status: DC

## 2011-03-09 NOTE — Telephone Encounter (Signed)
INR standing order, please sign and close the encounter  

## 2011-03-12 LAB — CBC
HCT: 38.1 % (ref 36.0–46.0)
Platelets: 239 10*3/uL (ref 150–400)
RDW: 12.3 % (ref 11.5–15.5)

## 2011-03-12 LAB — COMPREHENSIVE METABOLIC PANEL
Albumin: 3.6 g/dL (ref 3.5–5.2)
Alkaline Phosphatase: 68 U/L (ref 39–117)
BUN: 12 mg/dL (ref 6–23)
Calcium: 10 mg/dL (ref 8.4–10.5)
Creatinine, Ser: 0.78 mg/dL (ref 0.4–1.2)
Potassium: 4 mEq/L (ref 3.5–5.1)
Total Protein: 7.9 g/dL (ref 6.0–8.3)

## 2011-03-12 LAB — APTT: aPTT: 24 seconds (ref 24–37)

## 2011-03-12 LAB — PROTIME-INR: INR: 0.9 (ref 0.00–1.49)

## 2011-03-13 LAB — GLUCOSE, CAPILLARY: Glucose-Capillary: 124 mg/dL — ABNORMAL HIGH (ref 70–99)

## 2011-03-14 LAB — COMPREHENSIVE METABOLIC PANEL
ALT: 16 U/L (ref 0–35)
ALT: 21 U/L (ref 0–35)
AST: 20 U/L (ref 0–37)
AST: 32 U/L (ref 0–37)
AST: 37 U/L (ref 0–37)
Albumin: 3 g/dL — ABNORMAL LOW (ref 3.5–5.2)
Albumin: 3.7 g/dL (ref 3.5–5.2)
Alkaline Phosphatase: 52 U/L (ref 39–117)
Alkaline Phosphatase: 70 U/L (ref 39–117)
CO2: 30 mEq/L (ref 19–32)
Calcium: 8.7 mg/dL (ref 8.4–10.5)
Chloride: 101 mEq/L (ref 96–112)
Chloride: 107 mEq/L (ref 96–112)
Creatinine, Ser: 0.6 mg/dL (ref 0.4–1.2)
Creatinine, Ser: 0.83 mg/dL (ref 0.4–1.2)
GFR calc Af Amer: >60 mL/min (ref >60)
GFR calc Af Amer: >60 mL/min (ref >60)
GFR calc Af Amer: >60 mL/min (ref >60)
GFR calc non Af Amer: >60 mL/min (ref >60)
Potassium: 4 mEq/L (ref 3.5–5.1)
Sodium: 136 mEq/L (ref 135–145)
Total Bilirubin: 0.4 mg/dL (ref 0.3–1.2)
Total Bilirubin: 0.7 mg/dL (ref 0.3–1.2)
Total Protein: 7.4 g/dL (ref 6.0–8.3)
Total Protein: 8.5 g/dL — ABNORMAL HIGH (ref 6.0–8.3)

## 2011-03-14 LAB — BLOOD GAS, ARTERIAL
Acid-Base Excess: 2.3 mmol/L — ABNORMAL HIGH (ref 0.0–2.0)
Drawn by: 206361
Drawn by: 290241
FIO2: 0.21 %
Patient temperature: 98.6
TCO2: 27.8 mmol/L (ref 0–100)
pCO2 arterial: 40.5 mmHg (ref 35.0–45.0)
pH, Arterial: 7.418 — ABNORMAL HIGH (ref 7.350–7.400)
pO2, Arterial: 76.1 mmHg — ABNORMAL LOW (ref 80.0–100.0)

## 2011-03-14 LAB — CBC
MCHC: 35.9 g/dL (ref 30.0–36.0)
MCV: 96.9 fL (ref 78.0–100.0)
Platelets: 261 10*3/uL (ref 150–400)
Platelets: 353 10*3/uL (ref 150–400)
RBC: 3.65 MIL/uL — ABNORMAL LOW (ref 3.87–5.11)
RDW: 12.4 % (ref 11.5–15.5)
RDW: 12.7 % (ref 11.5–15.5)
WBC: 15.3 10*3/uL — ABNORMAL HIGH (ref 4.0–10.5)
WBC: 15.6 10*3/uL — ABNORMAL HIGH (ref 4.0–10.5)
WBC: 8.1 10*3/uL (ref 4.0–10.5)

## 2011-03-14 LAB — URINALYSIS, ROUTINE W REFLEX MICROSCOPIC
Bilirubin Urine: NEGATIVE
Nitrite: NEGATIVE
Specific Gravity, Urine: 1.021 (ref 1.005–1.030)
Urobilinogen, UA: 0.2 mg/dL (ref 0.0–1.0)
pH: 5.5 (ref 5.0–8.0)

## 2011-03-14 LAB — BASIC METABOLIC PANEL
BUN: 7 mg/dL (ref 6–23)
Calcium: 8.9 mg/dL (ref 8.4–10.5)
Creatinine, Ser: 0.64 mg/dL (ref 0.4–1.2)
GFR calc non Af Amer: >60 mL/min (ref >60)
Potassium: 3.6 mEq/L (ref 3.5–5.1)

## 2011-03-14 LAB — URINE MICROSCOPIC-ADD ON

## 2011-03-14 LAB — TYPE AND SCREEN: ABO/RH(D): O NEG

## 2011-03-14 LAB — APTT: aPTT: 26 seconds (ref 24–37)

## 2011-03-15 ENCOUNTER — Telehealth: Payer: Self-pay | Admitting: *Deleted

## 2011-03-15 DIAGNOSIS — Z7901 Long term (current) use of anticoagulants: Secondary | ICD-10-CM

## 2011-03-15 DIAGNOSIS — I82409 Acute embolism and thrombosis of unspecified deep veins of unspecified lower extremity: Secondary | ICD-10-CM

## 2011-03-15 DIAGNOSIS — Z5181 Encounter for therapeutic drug level monitoring: Secondary | ICD-10-CM

## 2011-03-16 ENCOUNTER — Other Ambulatory Visit: Payer: Self-pay | Admitting: Oncology

## 2011-03-16 ENCOUNTER — Ambulatory Visit: Payer: Medicare Other

## 2011-03-16 ENCOUNTER — Encounter (HOSPITAL_BASED_OUTPATIENT_CLINIC_OR_DEPARTMENT_OTHER): Payer: Medicare Other | Admitting: Oncology

## 2011-03-16 DIAGNOSIS — C7800 Secondary malignant neoplasm of unspecified lung: Secondary | ICD-10-CM

## 2011-03-16 DIAGNOSIS — Z7901 Long term (current) use of anticoagulants: Secondary | ICD-10-CM

## 2011-03-16 DIAGNOSIS — C801 Malignant (primary) neoplasm, unspecified: Secondary | ICD-10-CM

## 2011-03-16 DIAGNOSIS — C189 Malignant neoplasm of colon, unspecified: Secondary | ICD-10-CM

## 2011-03-16 DIAGNOSIS — Z5111 Encounter for antineoplastic chemotherapy: Secondary | ICD-10-CM

## 2011-03-16 DIAGNOSIS — D0590 Unspecified type of carcinoma in situ of unspecified breast: Secondary | ICD-10-CM

## 2011-03-16 DIAGNOSIS — C50919 Malignant neoplasm of unspecified site of unspecified female breast: Secondary | ICD-10-CM

## 2011-03-16 DIAGNOSIS — E860 Dehydration: Secondary | ICD-10-CM

## 2011-03-16 LAB — COMPREHENSIVE METABOLIC PANEL
AST: 26 U/L (ref 0–37)
Albumin: 4.3 g/dL (ref 3.5–5.2)
BUN: 24 mg/dL — ABNORMAL HIGH (ref 6–23)
Calcium: 9.4 mg/dL (ref 8.4–10.5)
Chloride: 106 mEq/L (ref 96–112)
Glucose, Bld: 98 mg/dL (ref 70–99)
Potassium: 4 mEq/L (ref 3.5–5.3)
Sodium: 141 mEq/L (ref 135–145)
Total Protein: 8.1 g/dL (ref 6.0–8.3)

## 2011-03-16 LAB — UA PROTEIN, DIPSTICK - CHCC: Protein, Urine: <30 mg/dL

## 2011-03-16 LAB — CBC WITH DIFFERENTIAL/PLATELET
Basophils Absolute: 0 10*3/uL (ref 0.0–0.1)
Eosinophils Absolute: 0.1 10*3/uL (ref 0.0–0.5)
HGB: 13 g/dL (ref 11.6–15.9)
NEUT#: 4.7 10*3/uL (ref 1.5–6.5)
RBC: 3.81 10*6/uL (ref 3.70–5.45)
RDW: 13.5 % (ref 11.2–14.5)
WBC: 5.8 10*3/uL (ref 3.9–10.3)
lymph#: 0.5 10*3/uL — ABNORMAL LOW (ref 0.9–3.3)

## 2011-03-16 NOTE — Telephone Encounter (Signed)
INR monitoring enrollment  

## 2011-03-17 LAB — COMPREHENSIVE METABOLIC PANEL
ALT: 29 U/L (ref 0–35)
AST: 24 U/L (ref 0–37)
Albumin: 3.5 g/dL (ref 3.5–5.2)
Alkaline Phosphatase: 82 U/L (ref 39–117)
Calcium: 10 mg/dL (ref 8.4–10.5)
GFR calc Af Amer: >60 mL/min (ref >60)
Glucose, Bld: 116 mg/dL — ABNORMAL HIGH (ref 70–99)
Potassium: 3.5 mEq/L (ref 3.5–5.1)
Sodium: 140 mEq/L (ref 135–145)
Total Protein: 8.4 g/dL — ABNORMAL HIGH (ref 6.0–8.3)

## 2011-03-17 LAB — CBC
Hemoglobin: 14.5 g/dL (ref 12.0–15.0)
MCHC: 34.7 g/dL (ref 30.0–36.0)
RBC: 4.34 MIL/uL (ref 3.87–5.11)
RDW: 12.9 % (ref 11.5–15.5)

## 2011-03-17 LAB — DIFFERENTIAL
Lymphocytes Relative: 5 % — ABNORMAL LOW (ref 12–46)
Lymphs Abs: 0.4 10*3/uL — ABNORMAL LOW (ref 0.7–4.0)
Monocytes Relative: 7 % (ref 3–12)
Neutro Abs: 7.3 10*3/uL (ref 1.7–7.7)
Neutrophils Relative %: 87 % — ABNORMAL HIGH (ref 43–77)

## 2011-03-17 LAB — POCT CARDIAC MARKERS
Myoglobin, poc: 101 ng/mL (ref 12–200)
Troponin i, poc: <0.05 ng/mL (ref 0.00–0.09)

## 2011-03-18 ENCOUNTER — Encounter (HOSPITAL_BASED_OUTPATIENT_CLINIC_OR_DEPARTMENT_OTHER): Payer: Medicare Other | Admitting: Oncology

## 2011-03-18 DIAGNOSIS — D0590 Unspecified type of carcinoma in situ of unspecified breast: Secondary | ICD-10-CM

## 2011-03-18 DIAGNOSIS — C189 Malignant neoplasm of colon, unspecified: Secondary | ICD-10-CM

## 2011-03-18 DIAGNOSIS — C50919 Malignant neoplasm of unspecified site of unspecified female breast: Secondary | ICD-10-CM

## 2011-03-22 ENCOUNTER — Ambulatory Visit (INDEPENDENT_AMBULATORY_CARE_PROVIDER_SITE_OTHER): Payer: Medicare Other | Admitting: Cardiovascular Disease

## 2011-03-22 ENCOUNTER — Encounter: Payer: Self-pay | Admitting: Cardiovascular Disease

## 2011-03-22 DIAGNOSIS — G459 Transient cerebral ischemic attack, unspecified: Secondary | ICD-10-CM

## 2011-03-22 DIAGNOSIS — E7800 Pure hypercholesterolemia, unspecified: Secondary | ICD-10-CM

## 2011-03-22 DIAGNOSIS — E785 Hyperlipidemia, unspecified: Secondary | ICD-10-CM

## 2011-03-22 DIAGNOSIS — I10 Essential (primary) hypertension: Secondary | ICD-10-CM

## 2011-03-22 DIAGNOSIS — C189 Malignant neoplasm of colon, unspecified: Secondary | ICD-10-CM

## 2011-03-22 NOTE — Progress Notes (Signed)
   Patient ID: Susan Duffy, female    DOB: [birth year], 65 y.o.   MRN: [id number]  HPI Comments: Ms. Wiater is a very pleasant 65 year old woman with a history of hypertension, hyperlipidemia, metastatic colon cancer, stage IV, DVT, with recent TIA with left leg weakness, visual changes on March 27 presents to establish care in our office. She was seen by cardiology in the hospital.  She reports that currently she feels well. She has no complaints and her symptoms resolved after 10 minutes. It occurred while she was having her premedication for chemotherapy and they were flushing medications.   She had a MRA and MRI doses and she normal, CT showed patchy white matter hypodensity representing small vessel ischemia, echocardiogram that showed ejection fraction 65% with diastolic dysfunction.  Carotid ultrasound showed no significant stenoses. Total cholesterol 197, LDL 146, HDL 19  Today she has no complaints of shortness of breath, neurologic type symptoms. She feels back to normal. She is about to start an aggressive chemotherapy regimen and.  EKG shows normal sinus rhythm with rate 77 beats per minute with no significant ST or T wave changes     Review of Systems  Constitutional: Negative.   HENT: Negative.   Eyes: Negative.   Respiratory: Negative.   Cardiovascular: Negative.   Gastrointestinal: Negative.   Musculoskeletal: Negative.   Skin: Negative.   Neurological: Negative.   Hematological: Negative.   Psychiatric/Behavioral: Negative.   All other systems reviewed and are negative.    BP 122/80  Pulse 77  Ht 5\' 5"  (1.651 m)  Wt 185 lb (83.915 kg)  BMI 30.79 kg/m2   Physical Exam  Nursing note and vitals reviewed. Constitutional: She is oriented to person, place, and time. She appears well-developed and well-nourished.  HENT:  Head: Normocephalic.  Nose: Nose normal.  Mouth/Throat: Oropharynx is clear and moist.  Eyes: Conjunctivae are normal. Pupils are  equal, round, and reactive to light.  Neck: Normal range of motion. Neck supple. No JVD present.  Cardiovascular: Normal rate, regular rhythm, normal heart sounds and intact distal pulses.  Exam reveals no gallop and no friction rub.   No murmur heard. Pulmonary/Chest: Effort normal and breath sounds normal. No respiratory distress. She has no wheezes. She has no rales. She exhibits no tenderness.  Abdominal: Soft. Bowel sounds are normal. She exhibits no distension. There is no tenderness.  Musculoskeletal: Normal range of motion. She exhibits no edema and no tenderness.  Lymphadenopathy:    She has no cervical adenopathy.  Neurological: She is alert and oriented to person, place, and time. Coordination normal.  Skin: Skin is warm and dry. No rash noted. No erythema.  Psychiatric: She has a normal mood and affect. Her behavior is normal. Judgment and thought content normal.         Assessment and Plan

## 2011-03-22 NOTE — Assessment & Plan Note (Signed)
She is scheduled for aggressive chemotherapy. We have suggested to her that if she has worsening edema or shortness of breath, that she contact our office for further evaluation.

## 2011-03-22 NOTE — Patient Instructions (Signed)
You are doing well. No medication changes were made. Please call us if you have new issues that need to be addressed before your next appt.  We will call you for a follow up Appt.   

## 2011-03-22 NOTE — Assessment & Plan Note (Signed)
She was recently started on a statin. Goal LDL should be less than 100, currently 146.

## 2011-03-22 NOTE — Assessment & Plan Note (Signed)
Blood pressure is well controlled on today's visit. No changes made to the medications. 

## 2011-03-22 NOTE — Assessment & Plan Note (Signed)
Recent TIA in the setting of flushing her port is concerning for thromboembolic phenomenon. The echocardiogram did not evaluate for PFO. We have suggested she stay on warfarin with INR greater than 2 q. Day she has an additional episode, we would perform a echocardiogram with bubble study, possibly a TEE.

## 2011-03-23 ENCOUNTER — Ambulatory Visit (INDEPENDENT_AMBULATORY_CARE_PROVIDER_SITE_OTHER): Payer: Medicare Other | Admitting: Family Medicine

## 2011-03-23 DIAGNOSIS — Z5181 Encounter for therapeutic drug level monitoring: Secondary | ICD-10-CM

## 2011-03-23 DIAGNOSIS — Z7901 Long term (current) use of anticoagulants: Secondary | ICD-10-CM

## 2011-03-23 DIAGNOSIS — I82409 Acute embolism and thrombosis of unspecified deep veins of unspecified lower extremity: Secondary | ICD-10-CM

## 2011-03-23 LAB — POCT INR: INR: 3.1

## 2011-03-30 ENCOUNTER — Other Ambulatory Visit: Payer: Self-pay | Admitting: Oncology

## 2011-03-30 ENCOUNTER — Encounter (HOSPITAL_BASED_OUTPATIENT_CLINIC_OR_DEPARTMENT_OTHER): Payer: Medicare Other | Admitting: Oncology

## 2011-03-30 DIAGNOSIS — C189 Malignant neoplasm of colon, unspecified: Secondary | ICD-10-CM

## 2011-03-30 DIAGNOSIS — C50919 Malignant neoplasm of unspecified site of unspecified female breast: Secondary | ICD-10-CM

## 2011-03-30 DIAGNOSIS — Z7901 Long term (current) use of anticoagulants: Secondary | ICD-10-CM

## 2011-03-30 DIAGNOSIS — Z5111 Encounter for antineoplastic chemotherapy: Secondary | ICD-10-CM

## 2011-03-30 DIAGNOSIS — Z5112 Encounter for antineoplastic immunotherapy: Secondary | ICD-10-CM

## 2011-03-30 DIAGNOSIS — D0590 Unspecified type of carcinoma in situ of unspecified breast: Secondary | ICD-10-CM

## 2011-03-30 DIAGNOSIS — E860 Dehydration: Secondary | ICD-10-CM

## 2011-03-30 LAB — COMPREHENSIVE METABOLIC PANEL
AST: 36 U/L (ref 0–37)
Albumin: 3.9 g/dL (ref 3.5–5.2)
Alkaline Phosphatase: 119 U/L — ABNORMAL HIGH (ref 39–117)
BUN: 17 mg/dL (ref 6–23)
Potassium: 4.2 mEq/L (ref 3.5–5.3)
Sodium: 139 mEq/L (ref 135–145)
Total Bilirubin: 0.4 mg/dL (ref 0.3–1.2)

## 2011-03-30 LAB — CBC WITH DIFFERENTIAL/PLATELET
BASO%: 0.3 % (ref 0.0–2.0)
EOS%: 2.1 % (ref 0.0–7.0)
MCH: 34.2 pg — ABNORMAL HIGH (ref 25.1–34.0)
MCHC: 35.2 g/dL (ref 31.5–36.0)
MONO%: 11.7 % (ref 0.0–14.0)
NEUT%: 75.5 % (ref 38.4–76.8)
RDW: 13.6 % (ref 11.2–14.5)
lymph#: 0.4 10*3/uL — ABNORMAL LOW (ref 0.9–3.3)

## 2011-04-01 ENCOUNTER — Encounter (HOSPITAL_BASED_OUTPATIENT_CLINIC_OR_DEPARTMENT_OTHER): Payer: Medicare Other | Admitting: Oncology

## 2011-04-01 DIAGNOSIS — Z7901 Long term (current) use of anticoagulants: Secondary | ICD-10-CM

## 2011-04-01 DIAGNOSIS — C189 Malignant neoplasm of colon, unspecified: Secondary | ICD-10-CM

## 2011-04-13 ENCOUNTER — Other Ambulatory Visit: Payer: Self-pay | Admitting: Oncology

## 2011-04-13 ENCOUNTER — Encounter (HOSPITAL_BASED_OUTPATIENT_CLINIC_OR_DEPARTMENT_OTHER): Payer: Medicare Other | Admitting: Oncology

## 2011-04-13 DIAGNOSIS — Z7901 Long term (current) use of anticoagulants: Secondary | ICD-10-CM

## 2011-04-13 DIAGNOSIS — C189 Malignant neoplasm of colon, unspecified: Secondary | ICD-10-CM

## 2011-04-13 DIAGNOSIS — Z5111 Encounter for antineoplastic chemotherapy: Secondary | ICD-10-CM

## 2011-04-13 DIAGNOSIS — C50919 Malignant neoplasm of unspecified site of unspecified female breast: Secondary | ICD-10-CM

## 2011-04-13 DIAGNOSIS — Z1389 Encounter for screening for other disorder: Secondary | ICD-10-CM

## 2011-04-13 DIAGNOSIS — C779 Secondary and unspecified malignant neoplasm of lymph node, unspecified: Secondary | ICD-10-CM

## 2011-04-13 DIAGNOSIS — Z5112 Encounter for antineoplastic immunotherapy: Secondary | ICD-10-CM

## 2011-04-13 DIAGNOSIS — D0590 Unspecified type of carcinoma in situ of unspecified breast: Secondary | ICD-10-CM

## 2011-04-13 LAB — CBC WITH DIFFERENTIAL/PLATELET
Basophils Absolute: 0 10*3/uL (ref 0.0–0.1)
EOS%: 4.5 % (ref 0.0–7.0)
Eosinophils Absolute: 0.2 10*3/uL (ref 0.0–0.5)
HGB: 12.2 g/dL (ref 11.6–15.9)
LYMPH%: 14.2 % (ref 14.0–49.7)
MCH: 33.5 pg (ref 25.1–34.0)
MCV: 97.8 fL (ref 79.5–101.0)
MONO%: 18.4 % — ABNORMAL HIGH (ref 0.0–14.0)
NEUT%: 62.1 % (ref 38.4–76.8)
Platelets: 124 10*3/uL — ABNORMAL LOW (ref 145–400)
RDW: 16.2 % — ABNORMAL HIGH (ref 11.2–14.5)

## 2011-04-13 LAB — COMPREHENSIVE METABOLIC PANEL
BUN: 15 mg/dL (ref 6–23)
CO2: 22 mEq/L (ref 19–32)
Calcium: 9 mg/dL (ref 8.4–10.5)
Chloride: 105 mEq/L (ref 96–112)
Creatinine, Ser: 0.72 mg/dL (ref 0.40–1.20)
Glucose, Bld: 94 mg/dL (ref 70–99)
Total Bilirubin: 0.5 mg/dL (ref 0.3–1.2)

## 2011-04-15 ENCOUNTER — Encounter: Payer: Medicare Other | Admitting: Oncology

## 2011-04-20 ENCOUNTER — Ambulatory Visit (INDEPENDENT_AMBULATORY_CARE_PROVIDER_SITE_OTHER): Payer: Medicare Other | Admitting: Family Medicine

## 2011-04-20 ENCOUNTER — Other Ambulatory Visit: Payer: Self-pay | Admitting: *Deleted

## 2011-04-20 DIAGNOSIS — Z5181 Encounter for therapeutic drug level monitoring: Secondary | ICD-10-CM

## 2011-04-20 DIAGNOSIS — I82409 Acute embolism and thrombosis of unspecified deep veins of unspecified lower extremity: Secondary | ICD-10-CM

## 2011-04-20 DIAGNOSIS — Z7901 Long term (current) use of anticoagulants: Secondary | ICD-10-CM

## 2011-04-20 LAB — POCT INR: INR: >8

## 2011-04-20 LAB — PROTIME-INR: Prothrombin Time: 100.4 s (ref 10.2–12.4)

## 2011-04-20 MED ORDER — SIMVASTATIN 20 MG PO TABS: 20.0000 mg | ORAL_TABLET | Freq: Every day | ORAL | Status: DC

## 2011-04-20 NOTE — Op Note (Signed)
Susan Duffy, Susan Duffy         ACCOUNT NO.:  [id number]   MEDICAL RECORD NO.:  [id number]          PATIENT TYPE:  INP   LOCATION:  3309                         FACILITY:  [facility]   PHYSICIAN:  Salvatore Decent. Dorris Fetch, M.D.DATE OF BIRTH:  1946/05/02   DATE OF PROCEDURE:  DATE OF DISCHARGE:                               OPERATIVE REPORT   PREOPERATIVE DIAGNOSIS:  Left lower lobe nodule.   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   PROCEDURE:  Left video-assisted thoracic surgery wedge resection, left  lower lobectomy.   SURGEON:  Salvatore Decent. Dorris Fetch, MD   ASSISTANT:  Doree Fudge, PA   ANESTHESIA:  General.   FINDINGS:  A 1.5-cm mass, left lower lobe.  Frozen section positive for  adenocarcinoma, tumor relatively deep intraparenchymal, unable to get  margin with wedge resection therefore lobectomy performed, enlarged  lymph nodes friable but not grossly cancerous.   CLINICAL NOTE:  Susan Duffy is a 65 year old woman with a history of  both colon cancer as well as recent ductal carcinoma in situ who now has  an enlarging left lower lobe nodule.  She was advised to undergo  thoracoscopic resection for definitive diagnosis.  We did discuss wedge  resection versus potential anatomic resection depending on  intraoperative findings.  The patient was aware of the indications,  risks, benefits, and alternatives.  She understood them, accepted them,  and agreed to proceed.   OPERATIVE NOTE:  Susan Duffy was brought to the preoperative holding  area on July 01, 2009.  There, the Anesthesia Service placed an arterial  blood pressure monitoring lines, intravenous access was obtained.  Intravenous antibiotics were administered.  PAS hose were placed for DVT  prophylaxis.  The patient was taken to the operating room, anesthetized  and intubated.  A Foley catheter was placed.  She was placed in a right  lateral decubitus position  and the left chest was prepped and draped in  usual sterile fashion.  Incision was made in approximately seventh  intercostal space in the midaxillary line and was carried through the  skin and subcutaneous tissue.  Hemostasis was achieved with  electrocautery.  The chest was entered bluntly using hemostat.  A port  was inserted through the incision, and the thoracoscope was placed in  the chest.  Single lung ventilation of the right lung was being carried  out and the patient tolerated this well throughout the procedure.  Additional port incisions were made anteriorly and posteriorly in the  fourth and fifth interspaces respectively for additional access.  The  lung was grasped.  Digital palpation was used to localize the nodule.  It was relatively deep in the parenchyma and was difficult to get a firm  grasp on.  It was roughly in the area that was border of the superior  segment from the basilar segments.  Of note with manipulating the lung,  the visceral pleura was very fragile and the lung tore easily and had  multiple bleeding sites from gentle retraction.  A wedge resection was  performed with multiple firings of the Endo GIA 45 stapler with 4.5-mm  staple  depth.  The specimen was removed and it was difficult to actually  palpate the nodule within the specimen.  Area of suspicion was marked  and the specimen was sent for frozen section.  The area suspicion was on  the staple line itself with no clear margin.  The lung was inspected.  No other palpable nodules were present.  The frozen section returned.  Adenocarcinoma was unclear if this was metastatic colon versus lung  primary but was not consistent with the breast metastasis.  Assessing  the staple line, particularly the area where the margin was closed, it  would be very difficult, not impossible to get an adequate margin  through this area because of the need for a relatively thick wedge  resection.  The patient had complete  fissures.  Incision was made to  proceed with lobectomy.  During the dissection for the lobectomy, the  inferior pulmonary ligament was mobilized, as the inferior pulmonary  vein was dissected out, there was a very large node approximately 3 cm  in length and 1.5 cm in diameter between the pulmonary vein and the  bronchus.  This was sent as a separate specimen.  Additional level of 10  nodes were also taken as separate specimen as were level 11 nodes.  The  pulmonary vein was dissected out and divided with ATW35 stapler.  At  this point, there was really only the bronchus and pulmonary artery to  be divided.  These were taken separately.  The pulmonary artery was  divided initially with an ATW35 stapler then the bronchus was divided  with an Echelon 60-mm stapler.  Prior to dividing the bronchus, the left  upper lobe was insufflated to ensure there was good aeration, and the  bronchus then was divided.  The specimen was placed in an endoscopic bag  and retrieved through the anterior utility thoracotomy, which was  approximately 6 cm in length.  Chest was filled with warm saline.  A  test inflation to 30 cm of water revealed no leakage from the bronchial  stump.  A 36-French right-angle chest tube was placed posteriorly and a  28-French tube was placed anteriorly.  The scope was removed.  The left  lung was ventilated.  The utility thoracotomy was closed with #1 Vicryl  suture to close the latissimus.  Subcutaneous tissue was closed with 2-0  Vicryl and skin was closed with 3-0 Vicryl subcuticular suture.  The  remaining posterior port incision was closed with #1 Vicryl fascial  suture followed by a 3-0 Vicryl subcuticular suture.  The patient was  extubated in the operating room and taken to the postanesthetic care  unit in good condition.      Salvatore Decent Dorris Fetch, M.D.  Electronically Signed     SCH/MEDQ  D:  07/01/2009  T:  07/02/2009  Job:  [id number]   cc:   [name], MD   Lurline Hare, MD  Drue Second, MD  Ollen Gross. Vernell Morgans, M.D.

## 2011-04-20 NOTE — Assessment & Plan Note (Signed)
OFFICE VISIT   Susan Duffy, Susan Duffy  DOB:  26-Dec-1945                                        October 26, 2010  CHART #:  [id number]   HISTORY OF PRESENT ILLNESS:  The patient is a 65 year old woman with a  history of colon cancer.  She then presented with a lung mass and I did  a left VATS and wedge resection and ended up doing a left lower  lobectomy because of margins.  She did well from that surgery, that was  in July 2010.  She subsequently was treated with chemotherapy with  FOLFOX initially and then switched to FOLFIRI.  She saw Dr. Welton Flakes in June  and there was a questionable lesion on her left chest wall.  She  subsequently had an MR but is really ill defined.  She had a repeat PET  done on November 4, which showed hypermetabolic nodule in the left chest  wall laterally about the level of the spleen.  She then had a CT-guided  needle biopsy and she had not been yet informed of the result that was  done on November 15.  The needle biopsy came back positive for  metastatic colon cancer.   PAST MEDICAL HISTORY:  Significant for metastatic colon cancer, ductal  carcinoma in situ, left lower extremity deep venous thrombosis.   CURRENT MEDICATIONS:  Hydrochlorothiazide 12.5 mg daily, multivitamin 1  daily, Coumadin daily, Avapro 75 mg daily, Aromasin 25 mg daily, vitamin  B12 2000 mcg daily.   ALLERGIES:  She has allergies to erythromycin, amoxicillin, tamoxifen.   REVIEW OF SYSTEMS:  She says she did not really notice this nodule or  feel any pain in that site.  She does have some discomfort there since  the needle biopsy.   PHYSICAL EXAMINATION:  The patient is a 65 year old woman in no acute  distress.  Blood pressure is 151/102, pulse 104, respirations are 16.  Her lungs clear with equal breath sounds.  Her previous incisions are  well healed.  There is hematoma on the lateral chest wall and palpable  mass, which likely is hematoma.  There is  bruising in the area.  It is  difficult to determine if this was a prior chest tube incision.  Her  other incisions are well healed and no evidence of disease or  recurrence.   PET, MRI, and CT scans were reviewed.   Pathology showed metastatic colorectal cancer.   IMPRESSION:  The patient is a 65 year old woman with a history of  metastatic colon cancer.  She had a left VATS and ultimately lobectomy  for lung metastasis back in July 2010.  She now is about a year out from  that and has a chest wall metastasis.  This most likely was seated at  the time of surgery or in early postoperative period.  It is not the  incision that the tumor was removed through.  It is possible that this  was a hematogenous metastasis, but it is really unusual location for  that.  There is a soft tissue nodule that appears to be external to the  rib cage, submuscular.  There is no definite invasion of the rib and  with her absence of pain I suspect it is not invading.  However, surgery  would require significant margins and likely have to take  some ribs.  I  do believe with this being metastatic colon cancer that if it is  radiosensitive that equivalent what chance of treatment for locally  would be achieved with radiation therapy.  She has an appointment to see  Dr. Michell Heinrich next Wednesday.  I recommended that she proceed with that  appointment.  If Dr. Michell Heinrich felt this was truly treatable that  radiation therapy would in my opinion be the best option.  If Dr.  Michell Heinrich does not think that it is treatable with a good chance of  complete local control, then I would be willing to do a surgical  resection although that would be a little out of the ordinary in terms  of location for surgical resection.  I will await Dr. Luciano Cutter  opinion and then we will proceed from there.   Salvatore Decent Dorris Fetch, M.D.  Electronically Signed   SCH/MEDQ  D:  10/26/2010  T:  10/27/2010  Job:  [id number]   cc:   [name], MD  Lurline Hare, MD  Jethro Bolus, MD

## 2011-04-20 NOTE — Assessment & Plan Note (Signed)
[facility] OFFICE NOTE   Susan Duffy, Susan Duffy                  MRN:          [id number]  DATE:09/06/2008                            DOB:          [birth year]    PRIMARY CARE PHYSICIAN:  [name], MD   REASON FOR VISIT:  Left-sided arm pain with radiation into the left  chest area.   HISTORY OF PRESENT ILLNESS:  Susan Duffy is a pleasant 65 year old  Caucasian female with a past medical history significant for colon  cancer, osteoarthritis, and scarlet fever as a small child who presents  today to the [facility]iology Clinic for evaluation of atypical left-sided  chest pain and left arm pain.  The patient states that she has been in  her normal state of good health until the last several weeks when she  started having left upper arm pain.  The pain can start when she is  exerting herself or when she is just sitting around the house.  It is a  dull aching type of pain and many times radiates into her left shoulder  and left chest.  There does not seem to be an association with shortness  of breath, diaphoresis, nausea, vomiting, palpitations, dizziness, near  syncope, or syncope.  The pain occurs several times per day over the  last several weeks and usually lasts anywhere from 10 minutes to 2  hours.  She has not found anything that makes the pain better.  It does  not seem to be associated with the use of her left arm.  She has no  other complaints today except for seasonal allergies.  She was seen in  Dr. Daphine Deutscher office yesterday and was found to have an abnormal  electrocardiogram.   PAST MEDICAL HISTORY:  1. Colon cancer.  2. Scarlet fever as a child  3. Iron deficiency anemia, that has resolved.  4. Osteoarthritis.  5. Seasonal allergies.   PAST SURGICAL HISTORY:  Partial colectomy in 2005 and a breast biopsy in  1965 that was benign.   ALLERGIES:  1. AMOXICILLIN.  2. ERYTHROMYCIN.   CURRENT  MEDICATIONS:  None.   SOCIAL HISTORY:  She is widowed and is a retired [employer].  She has 3 children.  She denies use of tobacco, alcohol, or  illicit drugs.   FAMILY HISTORY:  The patient's father had lung cancer, hypertension, and  diabetes mellitus.  She believes that he had a myocardial infarction in  his late 64s.  Her mother died at age 66 from a stroke.  There is no  other family history of coronary artery disease.  She has 2 brothers  that are both alive and well without any heart problems.   REVIEW OF SYSTEMS:  As stated in the history of present illness and is  otherwise negative.   PHYSICAL EXAMINATION:  GENERAL:  She is a pleasant, middle-aged  Caucasian female, in no acute distress.  VITAL SIGNS:  Blood pressure 180/118, pulse 73 and regular, respirations  12 and nonlabored, and weight 201 pounds.  NECK:  No JVD.  No carotid bruits.  No lymphadenopathy.  No thyromegaly.  SKIN:  Warm and slightly moist on examination.  HEENT:  Oropharynx clear.  Mucous membranes are moist.  LUNGS:  Clear to auscultation bilaterally without wheezes, rhonchi, or  crackles noted.  CARDIOVASCULAR:  Regular rate and rhythm without murmurs, gallops, or  rubs noted.  ABDOMEN:  Soft, nontender, and nondistended.  Bowel sounds were present.  EXTREMITIES:  No evidence of edema.  Pulses are 2+ in all extremities.   DIAGNOSTIC STUDIES:  A 12-lead electrocardiogram obtained in our office  today shows normal sinus rhythm with a ventricular rate of 73 beats per  minute.  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  There is poor R-wave progression through the  precordial leads.  There are nonspecific T-wave abnormalities noted.   ASSESSMENT AND PLAN:  This is a pleasant 65 year old Caucasian female  with no prior history of coronary artery disease who comes in for  further cardiac evaluation of atypical left arm pain with occasional   radiation into her left chest area.  The patient has no prior history of  hypertension, hyperlipidemia, diabetes mellitus, tobacco use, or strong  family history of coronary artery disease.  Her blood pressure is  elevated in our office today.  I reviewed the records from her primary  care office visit yesterday and found that her blood pressure was  elevated there as well.  She denies ever having been told that her blood  pressure was elevated prior to yesterday.  I think that we should keep a  close eye on her blood pressure and start her on antihypertensive  therapy if it remains elevated.  I feel that her left arm pain is most  likely noncardiac in nature; however, females many times present with  atypical symptoms and I am concerned that she may have had elevated  blood pressure for some time.  I have no other medical records that show  what her blood pressure was in the last several years.  Her EKG is also  slightly abnormal.  Because of this, I would like to get a surface  echocardiogram and treadmill exercise nuclear  study to rule out  coronary ischemia.  I will plan on seeing her back in our office several  weeks after her tests are performed.  I have encouraged her to continue  to follow with Dr. Ermalene Searing for her primary care needs.     Verne Carrow, MD  Electronically Signed    CM/MedQ  DD: 09/06/2008  DT: 09/07/2008  Job #: [id number]   cc:   [name], MD

## 2011-04-20 NOTE — Patient Instructions (Signed)
Holding Warfarin, recheck Friday, 04-23-11. Had Chemo 04-13-11. Drew blood to send for confirmation.

## 2011-04-20 NOTE — H&P (Signed)
HISTORY AND PHYSICAL EXAMINATION   June 26, 2009   Re:  ANJULI, GEMMILL           DOB:  07-25-1946   The patient is a 65 year old woman with a newly discovered left lower  lobe nodule.   HISTORY OF PRESENT ILLNESS:  The patient presents with a chief complaint  of a nodule on her left lung.  She is recently status post treatment for  ductal carcinoma in situ of the breast.  This was on the left breast.  She had a lumpectomy followed by radiation therapy.  She also was  treated with tamoxifen.  A CT scan was done on July 14 and compared to a  scan from April 5.  There was a left lower lobe pulmonary nodule, which  has increased from 8-15 mm in that time.  There also has a 9 x 40 mm  right breast nodule, this is unchanged.   The patient denies any cough, hemoptysis, shortness of breath.  She is a  nonsmoker with no known exposure to radon or asbestos.  She does have a  history of colon cancer treated with colectomy without adjuvant therapy  5 years ago and has had no evidence of recurrence in that time.   PAST MEDICAL HISTORY:  Significant for ductal carcinoma in situ of the  left breast, previous colon cancer status post colectomy, hypertension,  hyperlipidemia, history of hysterectomy.   CURRENT MEDICATIONS:  Hydrochlorothiazide, tamoxifen, and multivitamin.   ALLERGIES:  She has allergies to erythromycin and penicillin.   FAMILY HISTORY:  Father had a heart attack at age 62.  No family members  with lung cancer, she is aware of.   SOCIAL HISTORY:  She is widowed.  She has 3 children.  She is retired.  She recently moved to here from [place].  She does not smoke or drink.   REVIEW OF SYSTEMS:  Has been feeling well with no complaints.  All of  systems are negative.   PHYSICAL EXAMINATION:  General:  The patient is an overweight 63-year-  old woman in no acute distress.  Vital Signs:  Her blood pressure is  150/100, pulse is 101, respirations are 18,  her oxygen saturation is  95%.  She is somewhat anxious but in no acute distress.  Neurologic:  She is alert, oriented x3 with no focal deficits.  HEENT:  Unremarkable.  Neck:  Supple without thyromegaly or adenopathy.  There is no cervical  or supraclavicular adenopathy.  Breasts:  Not performed.  Cardiac:  Regular rate and rhythm.  Normal S1 and S2.  No rubs, murmurs, or  gallops.  Lungs:  Clear with equal breath sounds.  She has no peripheral  edema.   CT is reviewed shows a well-rounded nodule in the left lower lobe with  no mediastinal or hilar adenopathy.   IMPRESSION:  The patient is a 65 year old woman with a history of colon  cancer 5 years ago as well as recent ductal carcinoma in situ, now has a  left lower lobe nodule.  This increased in size over 3 months from 8-15  mm with an increase in size to this degree has to be considered  malignancy until proven otherwise and was recommended that she undergo a  left VATS and wedge resection, possible segmentectomy, it is unlikely  that this is a metastasis from her breast cancer given that that was in  situ and very small.  It is possible this is a metastatic lesion from  her colon cancer.  Even though has been 5 years, there is also a  possibility that this could be a lung primary, although does not really  have the appearance of a primary, furthermore it could be a benign  tumor.  However, at this point has to be considered malignancy until  proven otherwise, again due to the rapid growth.  I have recommended as  noted the VATS and resection for definitive biopsy and definitive  pathology.  I discussed with her the indications, risks, benefits, and  alternatives.  She understands the risks include but not limited to  death, MI, DVT, PE, bleeding, possible need for transfusions,  infections, as well as other unforeseeable complications.  She  understands and accepts these risks and agrees to proceed.  We will  schedule her for  surgery on Tuesday, July 27.  She will be admitted on  the day of surgery.   Salvatore Decent Dorris Fetch, M.D.  Electronically Signed   SCH/MEDQ  D:  06/26/2009  T:  06/27/2009  Job:  [id number]   cc:   Lurline Hare, MD  [name], MD  Drue Second, MD  Ollen Gross. Vernell Morgans, M.D.

## 2011-04-20 NOTE — Assessment & Plan Note (Signed)
OFFICE VISIT   Susan Duffy, Susan Duffy  DOB:  Aug 05, 1946                                        August 01, 2009  CHART #:  [id number]   REASON FOR VISIT:  Followup after recent surgery.   HISTORY OF PRESENT ILLNESS:  The patient is a 65 year old woman who  presented with a left lower lobe nodule.  She has a history of colon  cancer 5 years ago and also history of ductal carcinoma in situ of the  breast.  She had a screening CT scan done in July, which showed a left  lower lobe pulmonary nodule.  This had increased in size from 8 to 15 mm  during that time.  She underwent a left VATS and the lesion was wedge  resected.  The location of the lesion was actually difficult for staple  placement and we were able to resect the lesion and it was positive for  adenocarcinoma.  They cannot tell on the frozen whether this was  metastatic or lung primary, but the margins were positive at that time.  We attempted to do a segmentectomy, but again trustworthy of this lesion  was anatomically, that was going to leave her with very little residual  left lower lobe tissue and therefore, we made the decision to proceed  with a left lower lobectomy.  This was done via VATS approach without  rib spreading.  She tolerated the procedure well and did very well in  the postoperative period.  She now returns for a postoperative followup.  She states that she still has some incisional pain, but she has not  taken narcotics for over a week now and she has been taking occasional  Tylenol.  She has begun driving and that has gone fine.  She has not had  any shortness of breath.  Denies any swelling in her extremities.   PHYSICAL EXAMINATION:  General:  The patient is a 65 year old female in  no acute distress.  Vital Signs:  Her blood pressure is 122/90, pulse  88, respirations are  18, oxygen saturation is 96% on room air.  Lungs:  She has diminished breath sounds at the left base,  otherwise her lungs  are clear.  Her incisions are all healing well with no sign of  infection.   Chest x-ray shows postoperative changes.   IMPRESSION:  The patient is doing well at this point in time.  She is  now about a month out from a video-assisted lobectomy, turned out to be  metastatic colon cancer.  She has seen Dr. Drue Second and is being  evaluated.  She is going to have a colonoscopy, I believe next week  before final determination as to how to treat her, but so far that she  has not shown any other sites of metastatic disease.   From a surgical standpoint, she is doing quite well.  Her activities  essentially are unrestricted at this point in time, but did caution that  she will still have some discomfort and she should build in the  activities gradually.  She does have pain medication available if she  needs to take it as she gets more active.   She will be treated and followed by Dr. Welton Flakes, she does not need to  schedule a followup appointment with me at this time, but  I will be  happy to see her if I can be of any further assistance with her care in  the future.   Salvatore Decent Dorris Fetch, M.D.  Electronically Signed   SCH/MEDQ  D:  08/01/2009  T:  08/02/2009  Job:  [id number]   cc:   Drue Second, MD  Lurline Hare, MD  [name], MD

## 2011-04-20 NOTE — Assessment & Plan Note (Signed)
[facility] OFFICE NOTE   Susan Duffy, Susan Duffy                  MRN:          [id number]  DATE:09/16/2008                            DOB:          Jun 09, 1946    PRIMARY CARE PHYSICIAN:  Susan Nora, MD.   HISTORY OF PRESENT ILLNESS:  Susan Duffy is a pleasant 65 year old  Caucasian female with a past medical history significant for colon  cancer, osteoarthritis, and scarlet fever as a small child who presented  to my cardiology office on September 06, 2008, with complaints of atypical  left-sided chest pain and left arm pain.  Although she has never been  diagnosed with hypertension, diabetes mellitus, or hyperlipidemia; her  blood pressure was elevated at the time of her last visit in our office  as well as during her visit in her primary care office the week prior.  Given the fact that she had elevated blood pressure and a strong family  history of coronary artery disease, I proceeded to perform an adenosine  myocardial perfusion study as well as a surface echocardiogram.  The  patient completed these studies and returns today to review the results.  She tells me that she has had no recurrence of the left arm or left-  sided chest pain since her visit in this office.  She also denies any  dyspnea on exertion, palpitations, dizziness, near-syncope, syncope,  orthopnea, PND, or lower extremity edema.   PAST MEDICAL HISTORY:  Unchanged and includes colon cancer, scarlet  fever as a child, iron deficiency anemia that has resolved,  osteoarthritis, and seasonal allergies.   MEDICATIONS:  She does not currently take any medications.   REVIEW OF SYSTEMS:  As stated in the history of present illness, is  otherwise negative.   PHYSICAL EXAMINATION:  VITAL SIGNS:  Blood pressure 174/93, pulse is 75  and regular, and respirations 12 and nonlabored.  GENERAL:  She is a pleasant middle-aged Caucasian female, in no  acute  distress.  She is alert and oriented x3.  Appropriate mood and affect.  NEUROLOGIC:  No focal deficits.  SKIN:  Warm and dry.  NECK:  No JVD.  No carotid bruits.  No lymphadenopathy.  No thyromegaly.  LUNGS:  Clear to auscultation bilaterally without wheezes, rhonchi, or  crackles noted.  CARDIOVASCULAR:  Regular rate and rhythm without murmurs, gallops, or  rubs noted.  ABDOMEN:  Soft and nontender.  Bowel sounds are present.  EXTREMITIES:  No evidence of edema.  Pulses are 2+ in all extremities.   DIAGNOSTIC STUDIES:  1. Adenosine myocardial perfusion study performed on September 10, 2008,      showed normal contractility and thickening in all areas of the      myocardium with normal left ventricular function.  Ejection      fraction was estimated at 70%.  There was apical ascending both at      rest and with stress that is felt to be secondary to artifact.      There was no significant ischemia.  There were no EKG changes with      adenosine infusion.  2. Surface echocardiogram performed on September 11, 2008, showed overall      normal left ventricular systolic function with an ejection fraction      of 60%.  There were no significant valvular abnormalities noted.      The right ventricle was mildly dilated with a mild reduction in      right ventricular systolic function.  This was a technically      difficult study and left ventricular regional wall motion was not      commented on.   ASSESSMENT AND PLAN:  This is a pleasant 65 year old Caucasian female  with no known prior cardiac history who was initially seen in this  office 10 days ago with complaints of atypical left-sided chest pain  with left arm pain.  Her stress test was negative for ischemia as  outlined above.  Her surface echocardiogram showed normal left  ventricular systolic function with a mild reduction in right ventricular  systolic function.  Her blood pressure remains elevated today.  She  tells me that  she is to see her primary care physician, Susan Duffy in 3-  4 weeks.  I have discussed the potential adverse side effects of  uncontrolled systolic blood pressure with the patient.  She did take my  advice and bought a blood pressure cuff to have at home.  She tells me  that her blood pressure has been in the 140s and 150s systolically while  at home.  I think, it would be most reasonable at this time to start her  on a low dose of a thiazide diuretic.  I will start her on  hydrochlorothiazide 12.5 mg once daily.  She is to have lab work drawn  in 3 weeks and will followup with Susan Duffy in 3 weeks.  I will have  her follow in Susan Duffy office for her blood pressure control.  I  would like to see her back in this office in 1 year at which time, we  will repeat an echocardiogram to assess her right ventricular function  and  the size of the right ventricle.  The patient is aware that she  should call us if she has any change in her symptoms.     Verne Carrow, MD  Electronically Signed    CM/MedQ  DD: 09/16/2008  DT: 09/17/2008  Job #: [id number]   cc:   Susan Nora, MD

## 2011-04-20 NOTE — Discharge Summary (Signed)
Susan Duffy, [name]         ACCOUNT NO.:  [id number]   MEDICAL RECORD NO.:  [id number]          PATIENT TYPE:  INP   LOCATION:  3309                         FACILITY:  [facility]   PHYSICIAN:  Salvatore Decent. Dorris Fetch, M.D.DATE OF BIRTH:  [birth year]   DATE OF ADMISSION:  07/01/2009  DATE OF DISCHARGE:  07/05/2009                               DISCHARGE SUMMARY   PRIMARY ADMITTING DIAGNOSES:  1. Left lower lobe lung mass.  2. History of ductal carcinoma in situ of the left breast; status post      lumpectomy, radiation, and chemotherapy.  3. History of colon cancer, status post colectomy.   ADDITIONAL/DISCHARGE DIAGNOSES:  1. Metastatic adenocarcinoma of colorectal primary, left lower lobe.  2. History of ductal carcinoma in situ of the left breast, status post      chemotherapy, radiation, and lumpectomy.  3. History of colon cancer, status post colectomy.  4. Hypertension.  5. Hyperlipidemia.   PROCEDURES PERFORMED:  1. Left Video-assisted thoracoscopy.  2. Left mini thoracotomy with left lower lobectomy and lymph node      dissection.   HISTORY:  The patient is a 65 year old female with a history of colon  cancer and breast cancer who recently presented with left lower lobe  pulmonary nodule.  This had increased in size on scan performed on June 18, 2009, compared to previous scan from March 10, 2009.  The size had  increased from 8-15 mm in that time.  The patient denies any cough,  hemoptysis, or shortness of breath.  She has no history of smoking or  exposure to rate on her asbestos.  She was referred to Dr. Dorris Fetch  for consideration of surgical resection.  He reviewed her films and felt  that based on increase in the size of the lesion that it was felt to be  a malignancy until proven, otherwise and recommended that she undergo  left VATS at this time for resection.  He explained all risks, benefits,  and alternatives of the surgery to the patient and she agreed to  proceed.   HOSPITAL COURSE:  She was admitted to [facility] on July 01, 2009, and underwent a left VATS with left lower lobe lobectomy.  She  tolerated the procedure well and was transferred to the step-down unit  in stable condition.  Postoperatively, her course has been uneventful.  She initially experienced some nausea related to morphine PCA and this  was switched to Fentanyl, which she has tolerated well.  Her chest tubes  have been removed in the usual fashion and presently her chest x-ray  shows no pneumothorax.  She otherwise has remained afebrile and her  vital signs are stable.  Her incisions are all healing well.  She is  ambulating in the halls without problem.  She has now been switched to  p.o. pain medication and is tolerating this well.  Her most recent chest  x-ray has been stable.   Her labs show hemoglobin 13.1, hematocrit 37.1, white count 15.3, and  platelets 261.  BUN 7 and creatinine 0.64.  Her final pathology was  positive for metastatic colon cancer.  She has been seen and evaluated  on postop day #4, and it is felt that since she has remained stable and  is doing well, she may be discharged home at this time.   DISCHARGE MEDICATIONS:  1. Hydrochlorothiazide 12.5 mg daily.  2. Tamoxifen 20 mg daily.  3. Multivitamin daily.  4. Tylox 1-2 q.4 h. p.r.n. for pain.   DISCHARGE INSTRUCTIONS:  She is asked to refrain from driving, heavy  lifting, or strenuous activity.  She may continue ambulating daily and  using her incentive spirometer.  She may shower daily and clean her  incisions with soap and water.  She will continue her same preoperative  diet.   DISCHARGE FOLLOWUP:  She will see Dr. Dorris Fetch back in the office in  3 weeks with a chest x-ray.  She will also be referred back to Dr. Welton Flakes  for probable follow up PET scan and for consideration of further  adjuvant therapy.      Susan Duffy, P.A.      Salvatore Decent Dorris Fetch, M.D.   Electronically Signed    GC/MEDQ  D:  07/05/2009  T:  07/06/2009  Job:  [id number]   cc:   Drue Second, MD  Ollen Gross. Vernell Morgans, M.D.  [name], MD  Lurline Hare, MD

## 2011-04-22 ENCOUNTER — Other Ambulatory Visit: Payer: Self-pay | Admitting: Family Medicine

## 2011-04-23 ENCOUNTER — Ambulatory Visit (INDEPENDENT_AMBULATORY_CARE_PROVIDER_SITE_OTHER): Payer: Medicare Other | Admitting: Family Medicine

## 2011-04-23 ENCOUNTER — Other Ambulatory Visit: Payer: Self-pay | Admitting: *Deleted

## 2011-04-23 DIAGNOSIS — I82409 Acute embolism and thrombosis of unspecified deep veins of unspecified lower extremity: Secondary | ICD-10-CM

## 2011-04-23 DIAGNOSIS — Z7901 Long term (current) use of anticoagulants: Secondary | ICD-10-CM

## 2011-04-23 DIAGNOSIS — Z5181 Encounter for therapeutic drug level monitoring: Secondary | ICD-10-CM

## 2011-04-23 LAB — POCT INR: INR: 3.9

## 2011-04-23 MED ORDER — SIMVASTATIN 20 MG PO TABS: 20.0000 mg | ORAL_TABLET | Freq: Every day | ORAL | Status: DC

## 2011-04-27 ENCOUNTER — Other Ambulatory Visit: Payer: Self-pay | Admitting: Physician Assistant

## 2011-04-27 ENCOUNTER — Encounter (HOSPITAL_BASED_OUTPATIENT_CLINIC_OR_DEPARTMENT_OTHER): Payer: Medicare Other | Admitting: Oncology

## 2011-04-27 DIAGNOSIS — C50919 Malignant neoplasm of unspecified site of unspecified female breast: Secondary | ICD-10-CM

## 2011-04-27 DIAGNOSIS — Z7901 Long term (current) use of anticoagulants: Secondary | ICD-10-CM

## 2011-04-27 DIAGNOSIS — D0590 Unspecified type of carcinoma in situ of unspecified breast: Secondary | ICD-10-CM

## 2011-04-27 DIAGNOSIS — C779 Secondary and unspecified malignant neoplasm of lymph node, unspecified: Secondary | ICD-10-CM

## 2011-04-27 DIAGNOSIS — C189 Malignant neoplasm of colon, unspecified: Secondary | ICD-10-CM

## 2011-04-27 LAB — CBC WITH DIFFERENTIAL/PLATELET
BASO%: 1.3 % (ref 0.0–2.0)
EOS%: 4 % (ref 0.0–7.0)
LYMPH%: 24.3 % (ref 14.0–49.7)
MCH: 33.5 pg (ref 25.1–34.0)
MCHC: 34 g/dL (ref 31.5–36.0)
MONO#: 0.5 10*3/uL (ref 0.1–0.9)
NEUT%: 48.7 % (ref 38.4–76.8)
RBC: 3.37 10*6/uL — ABNORMAL LOW (ref 3.70–5.45)
WBC: 2.3 10*3/uL — ABNORMAL LOW (ref 3.9–10.3)
lymph#: 0.6 10*3/uL — ABNORMAL LOW (ref 0.9–3.3)
nRBC: 0 % (ref 0–0)

## 2011-04-30 ENCOUNTER — Ambulatory Visit (INDEPENDENT_AMBULATORY_CARE_PROVIDER_SITE_OTHER): Payer: Medicare Other | Admitting: Family Medicine

## 2011-04-30 DIAGNOSIS — Z5181 Encounter for therapeutic drug level monitoring: Secondary | ICD-10-CM

## 2011-04-30 DIAGNOSIS — Z7901 Long term (current) use of anticoagulants: Secondary | ICD-10-CM

## 2011-04-30 LAB — POCT INR: INR: 6

## 2011-05-04 ENCOUNTER — Ambulatory Visit: Payer: Medicare Other

## 2011-05-07 ENCOUNTER — Ambulatory Visit (INDEPENDENT_AMBULATORY_CARE_PROVIDER_SITE_OTHER): Payer: Medicare Other | Admitting: Family Medicine

## 2011-05-07 DIAGNOSIS — Z5181 Encounter for therapeutic drug level monitoring: Secondary | ICD-10-CM

## 2011-05-07 DIAGNOSIS — I82409 Acute embolism and thrombosis of unspecified deep veins of unspecified lower extremity: Secondary | ICD-10-CM

## 2011-05-07 DIAGNOSIS — Z7901 Long term (current) use of anticoagulants: Secondary | ICD-10-CM

## 2011-05-07 LAB — POCT INR: INR: 4.5

## 2011-05-10 ENCOUNTER — Ambulatory Visit: Payer: Medicare Other

## 2011-05-11 ENCOUNTER — Encounter (HOSPITAL_BASED_OUTPATIENT_CLINIC_OR_DEPARTMENT_OTHER): Payer: Medicare Other | Admitting: Oncology

## 2011-05-11 ENCOUNTER — Other Ambulatory Visit: Payer: Self-pay | Admitting: Physician Assistant

## 2011-05-11 DIAGNOSIS — C779 Secondary and unspecified malignant neoplasm of lymph node, unspecified: Secondary | ICD-10-CM

## 2011-05-11 DIAGNOSIS — C189 Malignant neoplasm of colon, unspecified: Secondary | ICD-10-CM

## 2011-05-11 DIAGNOSIS — D0590 Unspecified type of carcinoma in situ of unspecified breast: Secondary | ICD-10-CM

## 2011-05-11 DIAGNOSIS — Z5111 Encounter for antineoplastic chemotherapy: Secondary | ICD-10-CM

## 2011-05-11 DIAGNOSIS — E860 Dehydration: Secondary | ICD-10-CM

## 2011-05-11 DIAGNOSIS — C50919 Malignant neoplasm of unspecified site of unspecified female breast: Secondary | ICD-10-CM

## 2011-05-11 DIAGNOSIS — Z5112 Encounter for antineoplastic immunotherapy: Secondary | ICD-10-CM

## 2011-05-11 LAB — CBC WITH DIFFERENTIAL/PLATELET
BASO%: 0 % (ref 0.0–2.0)
Basophils Absolute: 0 10*3/uL (ref 0.0–0.1)
EOS%: 1.1 % (ref 0.0–7.0)
HCT: 35.4 % (ref 34.8–46.6)
HGB: 12.4 g/dL (ref 11.6–15.9)
MCH: 35.1 pg — ABNORMAL HIGH (ref 25.1–34.0)
MCHC: 34.9 g/dL (ref 31.5–36.0)
MCV: 100.6 fL (ref 79.5–101.0)
MONO%: 11 % (ref 0.0–14.0)
NEUT%: 77.3 % — ABNORMAL HIGH (ref 38.4–76.8)
lymph#: 0.7 10*3/uL — ABNORMAL LOW (ref 0.9–3.3)

## 2011-05-11 LAB — COMPREHENSIVE METABOLIC PANEL
AST: 29 U/L (ref 0–37)
Alkaline Phosphatase: 148 U/L — ABNORMAL HIGH (ref 39–117)
BUN: 24 mg/dL — ABNORMAL HIGH (ref 6–23)
Creatinine, Ser: 0.77 mg/dL (ref 0.50–1.10)
Glucose, Bld: 94 mg/dL (ref 70–99)
Total Bilirubin: 0.6 mg/dL (ref 0.3–1.2)

## 2011-05-12 ENCOUNTER — Ambulatory Visit (INDEPENDENT_AMBULATORY_CARE_PROVIDER_SITE_OTHER): Payer: Medicare Other | Admitting: Internal Medicine

## 2011-05-12 DIAGNOSIS — Z5181 Encounter for therapeutic drug level monitoring: Secondary | ICD-10-CM

## 2011-05-12 DIAGNOSIS — I82409 Acute embolism and thrombosis of unspecified deep veins of unspecified lower extremity: Secondary | ICD-10-CM

## 2011-05-12 DIAGNOSIS — Z7901 Long term (current) use of anticoagulants: Secondary | ICD-10-CM

## 2011-05-12 LAB — POCT INR: INR: 3.5

## 2011-05-12 NOTE — Patient Instructions (Signed)
Changed dose to 2.5 mg daily , check in 1 week

## 2011-05-13 ENCOUNTER — Encounter (INDEPENDENT_AMBULATORY_CARE_PROVIDER_SITE_OTHER): Payer: Self-pay | Admitting: General Surgery

## 2011-05-13 ENCOUNTER — Encounter (HOSPITAL_BASED_OUTPATIENT_CLINIC_OR_DEPARTMENT_OTHER): Payer: Medicare Other | Admitting: Oncology

## 2011-05-13 DIAGNOSIS — C189 Malignant neoplasm of colon, unspecified: Secondary | ICD-10-CM

## 2011-05-13 DIAGNOSIS — Z452 Encounter for adjustment and management of vascular access device: Secondary | ICD-10-CM

## 2011-05-13 DIAGNOSIS — C50919 Malignant neoplasm of unspecified site of unspecified female breast: Secondary | ICD-10-CM

## 2011-05-19 ENCOUNTER — Ambulatory Visit (INDEPENDENT_AMBULATORY_CARE_PROVIDER_SITE_OTHER): Payer: Medicare Other | Admitting: Family Medicine

## 2011-05-19 DIAGNOSIS — I82409 Acute embolism and thrombosis of unspecified deep veins of unspecified lower extremity: Secondary | ICD-10-CM

## 2011-05-19 DIAGNOSIS — Z5181 Encounter for therapeutic drug level monitoring: Secondary | ICD-10-CM

## 2011-05-19 DIAGNOSIS — Z7901 Long term (current) use of anticoagulants: Secondary | ICD-10-CM

## 2011-05-19 LAB — POCT INR: INR: 1.7

## 2011-05-19 NOTE — Patient Instructions (Signed)
Take 2.5 mg daily, Today and next Jhonnie Garner take 5 mg recheck in 1 week

## 2011-05-25 ENCOUNTER — Other Ambulatory Visit: Payer: Self-pay | Admitting: Physician Assistant

## 2011-05-25 ENCOUNTER — Encounter (HOSPITAL_BASED_OUTPATIENT_CLINIC_OR_DEPARTMENT_OTHER): Payer: Medicare Other | Admitting: Oncology

## 2011-05-25 DIAGNOSIS — D0590 Unspecified type of carcinoma in situ of unspecified breast: Secondary | ICD-10-CM

## 2011-05-25 DIAGNOSIS — Z5111 Encounter for antineoplastic chemotherapy: Secondary | ICD-10-CM

## 2011-05-25 DIAGNOSIS — C7800 Secondary malignant neoplasm of unspecified lung: Secondary | ICD-10-CM

## 2011-05-25 DIAGNOSIS — C50919 Malignant neoplasm of unspecified site of unspecified female breast: Secondary | ICD-10-CM

## 2011-05-25 DIAGNOSIS — C189 Malignant neoplasm of colon, unspecified: Secondary | ICD-10-CM

## 2011-05-25 DIAGNOSIS — E860 Dehydration: Secondary | ICD-10-CM

## 2011-05-25 DIAGNOSIS — C801 Malignant (primary) neoplasm, unspecified: Secondary | ICD-10-CM

## 2011-05-25 LAB — COMPREHENSIVE METABOLIC PANEL
ALT: 21 U/L (ref 0–35)
AST: 25 U/L (ref 0–37)
Albumin: 4.1 g/dL (ref 3.5–5.2)
Alkaline Phosphatase: 123 U/L — ABNORMAL HIGH (ref 39–117)
BUN: 16 mg/dL (ref 6–23)
Calcium: 8.8 mg/dL (ref 8.4–10.5)
Chloride: 106 mEq/L (ref 96–112)
Creatinine, Ser: 0.81 mg/dL (ref 0.50–1.10)
Potassium: 4.2 mEq/L (ref 3.5–5.3)

## 2011-05-25 LAB — CBC WITH DIFFERENTIAL/PLATELET
Basophils Absolute: 0 10*3/uL (ref 0.0–0.1)
EOS%: 5.1 % (ref 0.0–7.0)
HCT: 33.3 % — ABNORMAL LOW (ref 34.8–46.6)
HGB: 11.5 g/dL — ABNORMAL LOW (ref 11.6–15.9)
MCH: 35 pg — ABNORMAL HIGH (ref 25.1–34.0)
MONO#: 0.5 10*3/uL (ref 0.1–0.9)
NEUT#: 2.1 10*3/uL (ref 1.5–6.5)
RDW: 16.8 % — ABNORMAL HIGH (ref 11.2–14.5)
WBC: 3.3 10*3/uL — ABNORMAL LOW (ref 3.9–10.3)
lymph#: 0.5 10*3/uL — ABNORMAL LOW (ref 0.9–3.3)

## 2011-05-27 ENCOUNTER — Encounter (HOSPITAL_BASED_OUTPATIENT_CLINIC_OR_DEPARTMENT_OTHER): Payer: Medicare Other | Admitting: Oncology

## 2011-05-27 DIAGNOSIS — Z452 Encounter for adjustment and management of vascular access device: Secondary | ICD-10-CM

## 2011-05-27 DIAGNOSIS — C50919 Malignant neoplasm of unspecified site of unspecified female breast: Secondary | ICD-10-CM

## 2011-05-27 DIAGNOSIS — C779 Secondary and unspecified malignant neoplasm of lymph node, unspecified: Secondary | ICD-10-CM

## 2011-05-27 DIAGNOSIS — C189 Malignant neoplasm of colon, unspecified: Secondary | ICD-10-CM

## 2011-05-28 ENCOUNTER — Ambulatory Visit (INDEPENDENT_AMBULATORY_CARE_PROVIDER_SITE_OTHER): Payer: Medicare Other | Admitting: Internal Medicine

## 2011-05-28 DIAGNOSIS — Z5181 Encounter for therapeutic drug level monitoring: Secondary | ICD-10-CM

## 2011-05-28 DIAGNOSIS — Z7901 Long term (current) use of anticoagulants: Secondary | ICD-10-CM

## 2011-05-28 DIAGNOSIS — I82409 Acute embolism and thrombosis of unspecified deep veins of unspecified lower extremity: Secondary | ICD-10-CM

## 2011-05-28 NOTE — Patient Instructions (Signed)
Take 2.5 mg daily, Wed take 5 mg, recheck in 1 week

## 2011-06-04 ENCOUNTER — Ambulatory Visit (INDEPENDENT_AMBULATORY_CARE_PROVIDER_SITE_OTHER): Payer: Medicare Other | Admitting: Family Medicine

## 2011-06-04 DIAGNOSIS — Z7901 Long term (current) use of anticoagulants: Secondary | ICD-10-CM

## 2011-06-04 DIAGNOSIS — Z5181 Encounter for therapeutic drug level monitoring: Secondary | ICD-10-CM

## 2011-06-04 DIAGNOSIS — I82409 Acute embolism and thrombosis of unspecified deep veins of unspecified lower extremity: Secondary | ICD-10-CM

## 2011-06-04 LAB — POCT INR: INR: 2.8

## 2011-06-04 NOTE — Patient Instructions (Signed)
Continue current dose, check in 1 week, while seeing Dr

## 2011-06-08 ENCOUNTER — Other Ambulatory Visit: Payer: Self-pay | Admitting: Oncology

## 2011-06-08 ENCOUNTER — Encounter (HOSPITAL_BASED_OUTPATIENT_CLINIC_OR_DEPARTMENT_OTHER): Payer: Medicare Other | Admitting: Oncology

## 2011-06-08 DIAGNOSIS — C779 Secondary and unspecified malignant neoplasm of lymph node, unspecified: Secondary | ICD-10-CM

## 2011-06-08 DIAGNOSIS — Z5111 Encounter for antineoplastic chemotherapy: Secondary | ICD-10-CM

## 2011-06-08 DIAGNOSIS — C189 Malignant neoplasm of colon, unspecified: Secondary | ICD-10-CM

## 2011-06-08 DIAGNOSIS — C50919 Malignant neoplasm of unspecified site of unspecified female breast: Secondary | ICD-10-CM

## 2011-06-08 DIAGNOSIS — Z452 Encounter for adjustment and management of vascular access device: Secondary | ICD-10-CM

## 2011-06-08 DIAGNOSIS — Z1389 Encounter for screening for other disorder: Secondary | ICD-10-CM

## 2011-06-08 LAB — CBC WITH DIFFERENTIAL/PLATELET
Basophils Absolute: 0 10*3/uL (ref 0.0–0.1)
Eosinophils Absolute: 0.1 10*3/uL (ref 0.0–0.5)
HCT: 34.5 % — ABNORMAL LOW (ref 34.8–46.6)
LYMPH%: 23.8 % (ref 14.0–49.7)
MCV: 101.8 fL — ABNORMAL HIGH (ref 79.5–101.0)
MONO#: 0.4 10*3/uL (ref 0.1–0.9)
MONO%: 17.6 % — ABNORMAL HIGH (ref 0.0–14.0)
NEUT#: 1.3 10*3/uL — ABNORMAL LOW (ref 1.5–6.5)
NEUT%: 52.3 % (ref 38.4–76.8)
Platelets: 105 10*3/uL — ABNORMAL LOW (ref 145–400)
RBC: 3.39 10*6/uL — ABNORMAL LOW (ref 3.70–5.45)
WBC: 2.4 10*3/uL — ABNORMAL LOW (ref 3.9–10.3)

## 2011-06-11 ENCOUNTER — Ambulatory Visit (INDEPENDENT_AMBULATORY_CARE_PROVIDER_SITE_OTHER): Payer: Medicare Other | Admitting: Family Medicine

## 2011-06-11 ENCOUNTER — Encounter: Payer: Self-pay | Admitting: Family Medicine

## 2011-06-11 DIAGNOSIS — Z5181 Encounter for therapeutic drug level monitoring: Secondary | ICD-10-CM

## 2011-06-11 DIAGNOSIS — E119 Type 2 diabetes mellitus without complications: Secondary | ICD-10-CM

## 2011-06-11 DIAGNOSIS — G459 Transient cerebral ischemic attack, unspecified: Secondary | ICD-10-CM

## 2011-06-11 DIAGNOSIS — Z7901 Long term (current) use of anticoagulants: Secondary | ICD-10-CM

## 2011-06-11 DIAGNOSIS — E7800 Pure hypercholesterolemia, unspecified: Secondary | ICD-10-CM

## 2011-06-11 DIAGNOSIS — I10 Essential (primary) hypertension: Secondary | ICD-10-CM

## 2011-06-11 DIAGNOSIS — I82409 Acute embolism and thrombosis of unspecified deep veins of unspecified lower extremity: Secondary | ICD-10-CM

## 2011-06-11 DIAGNOSIS — B372 Candidiasis of skin and nail: Secondary | ICD-10-CM

## 2011-06-11 LAB — HM DIABETES FOOT EXAM: HM Diabetic Foot Exam: NORMAL

## 2011-06-11 LAB — POCT INR: INR: 5.1

## 2011-06-11 MED ORDER — NYSTATIN 100000 UNIT/GM EX CREA: TOPICAL_CREAM | Freq: Two times a day (BID) | CUTANEOUS | Status: DC

## 2011-06-11 NOTE — Assessment & Plan Note (Signed)
No further episodes. On coumadin lifelong.

## 2011-06-11 NOTE — Assessment & Plan Note (Signed)
Due for reeval. Goal per Cards LDL <100.

## 2011-06-11 NOTE — Assessment & Plan Note (Signed)
Fairly well controlled at home and MD office. Continue to follow. Encouraged exercise, weight loss, healthy eating habits as tolerated.

## 2011-06-11 NOTE — Assessment & Plan Note (Signed)
Treat with Nystatin cream. 

## 2011-06-11 NOTE — Patient Instructions (Addendum)
Return for fasting labs in next few days.  Recommend eye exam yearly for diabeted. Apply fungal cream under breast BID.. Call if not improving as expected. Continue 48 hours after rash resolved.

## 2011-06-11 NOTE — Assessment & Plan Note (Signed)
Due for re-eval. 

## 2011-06-11 NOTE — Patient Instructions (Addendum)
Hold x 2 then take 2.5 mg on Sunday will recheck Monday, 06-14-11.( patient's inr's very unstable due to Chemo

## 2011-06-11 NOTE — Progress Notes (Signed)
  Subjective:    Patient ID: Susan Duffy, female    DOB: 15-Aug-1946, 65 y.o.   MRN: [id number]  HPI 66 year old female with hx of breast and metastatic colon cancer, stage IV currently on chemotherapy... Seen by Dr. Patsy Lager following hospital discharge in 03/2011 after CVA.  TIA (transient ischemic attack) in setting of flushing her port. TIA with normal echo and carotids  Cont anticoag with coumadin. Good BP control and statin  Elevated troponin: Referred to Dr. Mariah Milling Cardiology. Seen on 4/16... Concerned about thromboembolic phen. Causing TIA Recommend staying on coumadin with goal INR >2 Recommended if further episode.. Bubble study etc.  Per pt no further problems at that time.   Hypertension:  On avapro  Using medication without problems or lightheadedness:  Chest pain with exertion: Edema: Short of breath: Average home BPs: Other issues:  Elevated Cholesterol: Started on zocor 20 mg daily in hospital in 03/2011 Using medications without problems: None Muscle aches: None   She has noted red rash under breasts.. Irritated.. Nothing helping it to resolve.    Review of Systems  Constitutional: Positive for fatigue. Negative for fever.  HENT: Negative for ear pain.   Respiratory: Positive for shortness of breath.        Stable since lung surgery.  Cardiovascular: Negative for chest pain.       Objective:   Physical Exam  Constitutional: Vital signs are normal. She appears well-developed and well-nourished. She is cooperative.  Non-toxic appearance. She does not appear ill. No distress.  HENT:  Head: Normocephalic.  Right Ear: Hearing, tympanic membrane, external ear and ear canal normal. Tympanic membrane is not erythematous, not retracted and not bulging.  Left Ear: Hearing, tympanic membrane, external ear and ear canal normal. Tympanic membrane is not erythematous, not retracted and not bulging.  Nose: No mucosal edema or rhinorrhea. Right sinus exhibits no  maxillary sinus tenderness and no frontal sinus tenderness. Left sinus exhibits no maxillary sinus tenderness and no frontal sinus tenderness.  Mouth/Throat: Uvula is midline, oropharynx is clear and moist and mucous membranes are normal.  Eyes: Conjunctivae, EOM and lids are normal. Pupils are equal, round, and reactive to light. No foreign bodies found.  Neck: Trachea normal and normal range of motion. Neck supple. Carotid bruit is not present. No mass and no thyromegaly present.  Cardiovascular: Normal rate, regular rhythm, S1 normal, S2 normal, normal heart sounds, intact distal pulses and normal pulses.  Exam reveals no gallop and no friction rub.   No murmur heard. Pulmonary/Chest: Effort normal and breath sounds normal. Not tachypneic. No respiratory distress. She has no decreased breath sounds. She has no wheezes. She has no rhonchi. She has no rales.  Abdominal: Soft. Normal appearance and bowel sounds are normal. There is no tenderness.  Neurological: She is alert.  Skin: Skin is warm, dry and intact. Rash noted. No petechiae noted. Rash is macular. Rash is not papular, not nodular, not pustular and not vesicular.       Erythematous rash under breasts with satelitte lesions, covered in powder.  Psychiatric: Her speech is normal and behavior is normal. Judgment and thought content normal. Her mood appears not anxious. Cognition and memory are normal. She does not exhibit a depressed mood.   Diabetic foot exam: Normal inspection No skin breakdown No calluses  Normal DP pulses Normal sensation to light touch and monofilament Nails normal        Assessment & Plan:

## 2011-06-14 ENCOUNTER — Ambulatory Visit (INDEPENDENT_AMBULATORY_CARE_PROVIDER_SITE_OTHER): Payer: Medicare Other | Admitting: Family Medicine

## 2011-06-14 DIAGNOSIS — Z5181 Encounter for therapeutic drug level monitoring: Secondary | ICD-10-CM

## 2011-06-14 DIAGNOSIS — Z7901 Long term (current) use of anticoagulants: Secondary | ICD-10-CM

## 2011-06-14 DIAGNOSIS — I82409 Acute embolism and thrombosis of unspecified deep veins of unspecified lower extremity: Secondary | ICD-10-CM

## 2011-06-14 NOTE — Patient Instructions (Addendum)
2.5 mg daily recheck Fridaily

## 2011-06-18 ENCOUNTER — Ambulatory Visit (INDEPENDENT_AMBULATORY_CARE_PROVIDER_SITE_OTHER): Payer: Medicare Other | Admitting: Family Medicine

## 2011-06-18 DIAGNOSIS — I82409 Acute embolism and thrombosis of unspecified deep veins of unspecified lower extremity: Secondary | ICD-10-CM

## 2011-06-18 DIAGNOSIS — Z5181 Encounter for therapeutic drug level monitoring: Secondary | ICD-10-CM

## 2011-06-18 DIAGNOSIS — Z7901 Long term (current) use of anticoagulants: Secondary | ICD-10-CM

## 2011-06-18 LAB — POCT INR: INR: 3.1

## 2011-06-18 NOTE — Patient Instructions (Signed)
Take 2.5 mg daily, recheck 1 week

## 2011-06-22 ENCOUNTER — Other Ambulatory Visit: Payer: Self-pay | Admitting: Oncology

## 2011-06-22 ENCOUNTER — Encounter (HOSPITAL_BASED_OUTPATIENT_CLINIC_OR_DEPARTMENT_OTHER): Payer: Medicare Other | Admitting: Oncology

## 2011-06-22 DIAGNOSIS — C50919 Malignant neoplasm of unspecified site of unspecified female breast: Secondary | ICD-10-CM

## 2011-06-22 DIAGNOSIS — Z5111 Encounter for antineoplastic chemotherapy: Secondary | ICD-10-CM

## 2011-06-22 DIAGNOSIS — Z853 Personal history of malignant neoplasm of breast: Secondary | ICD-10-CM

## 2011-06-22 DIAGNOSIS — C189 Malignant neoplasm of colon, unspecified: Secondary | ICD-10-CM

## 2011-06-22 DIAGNOSIS — E860 Dehydration: Secondary | ICD-10-CM

## 2011-06-22 LAB — COMPREHENSIVE METABOLIC PANEL
AST: 33 U/L (ref 0–37)
Albumin: 4.1 g/dL (ref 3.5–5.2)
BUN: 23 mg/dL (ref 6–23)
CO2: 23 mEq/L (ref 19–32)
Calcium: 9.4 mg/dL (ref 8.4–10.5)
Chloride: 102 mEq/L (ref 96–112)
Creatinine, Ser: 0.85 mg/dL (ref 0.50–1.10)
Glucose, Bld: 97 mg/dL (ref 70–99)
Potassium: 4.2 mEq/L (ref 3.5–5.3)

## 2011-06-22 LAB — CBC WITH DIFFERENTIAL/PLATELET
BASO%: 0.6 % (ref 0.0–2.0)
MCHC: 33.4 g/dL (ref 31.5–36.0)
MONO#: 0.7 10*3/uL (ref 0.1–0.9)
RBC: 3.72 10*6/uL (ref 3.70–5.45)
RDW: 15.3 % — ABNORMAL HIGH (ref 11.2–14.5)
WBC: 6.6 10*3/uL (ref 3.9–10.3)
lymph#: 0.8 10*3/uL — ABNORMAL LOW (ref 0.9–3.3)
nRBC: 0 % (ref 0–0)

## 2011-06-22 LAB — CEA: CEA: 1 ng/mL (ref 0.0–5.0)

## 2011-06-23 ENCOUNTER — Encounter (HOSPITAL_BASED_OUTPATIENT_CLINIC_OR_DEPARTMENT_OTHER): Payer: Medicare Other | Admitting: Oncology

## 2011-06-23 DIAGNOSIS — C189 Malignant neoplasm of colon, unspecified: Secondary | ICD-10-CM

## 2011-06-25 ENCOUNTER — Ambulatory Visit (INDEPENDENT_AMBULATORY_CARE_PROVIDER_SITE_OTHER): Payer: Medicare Other | Admitting: Family Medicine

## 2011-06-25 DIAGNOSIS — Z7901 Long term (current) use of anticoagulants: Secondary | ICD-10-CM

## 2011-06-25 DIAGNOSIS — I82409 Acute embolism and thrombosis of unspecified deep veins of unspecified lower extremity: Secondary | ICD-10-CM

## 2011-06-25 DIAGNOSIS — Z5181 Encounter for therapeutic drug level monitoring: Secondary | ICD-10-CM

## 2011-06-25 LAB — POCT INR: INR: 2.7

## 2011-06-25 NOTE — Patient Instructions (Signed)
Take 2.5 mg daily recheck Fri

## 2011-07-02 ENCOUNTER — Ambulatory Visit (INDEPENDENT_AMBULATORY_CARE_PROVIDER_SITE_OTHER): Payer: Medicare Other | Admitting: Family Medicine

## 2011-07-02 DIAGNOSIS — Z5181 Encounter for therapeutic drug level monitoring: Secondary | ICD-10-CM

## 2011-07-02 DIAGNOSIS — I82409 Acute embolism and thrombosis of unspecified deep veins of unspecified lower extremity: Secondary | ICD-10-CM

## 2011-07-02 DIAGNOSIS — Z7901 Long term (current) use of anticoagulants: Secondary | ICD-10-CM

## 2011-07-02 LAB — POCT INR: INR: 1.8

## 2011-07-02 NOTE — Patient Instructions (Signed)
Take 2.5 mg daily , 5 mg Friday/Monday. Recheck 1 week has chemo Tuesday which has been effecting INR

## 2011-07-06 ENCOUNTER — Encounter (HOSPITAL_BASED_OUTPATIENT_CLINIC_OR_DEPARTMENT_OTHER): Payer: Medicare Other | Admitting: Oncology

## 2011-07-06 ENCOUNTER — Other Ambulatory Visit: Payer: Self-pay | Admitting: Oncology

## 2011-07-06 DIAGNOSIS — C7800 Secondary malignant neoplasm of unspecified lung: Secondary | ICD-10-CM

## 2011-07-06 DIAGNOSIS — C801 Malignant (primary) neoplasm, unspecified: Secondary | ICD-10-CM

## 2011-07-06 DIAGNOSIS — C189 Malignant neoplasm of colon, unspecified: Secondary | ICD-10-CM

## 2011-07-06 DIAGNOSIS — E860 Dehydration: Secondary | ICD-10-CM

## 2011-07-06 DIAGNOSIS — Z5111 Encounter for antineoplastic chemotherapy: Secondary | ICD-10-CM

## 2011-07-06 DIAGNOSIS — Z7901 Long term (current) use of anticoagulants: Secondary | ICD-10-CM

## 2011-07-06 DIAGNOSIS — Z853 Personal history of malignant neoplasm of breast: Secondary | ICD-10-CM

## 2011-07-06 LAB — CBC WITH DIFFERENTIAL/PLATELET
Basophils Absolute: 0 10*3/uL (ref 0.0–0.1)
EOS%: 4.8 % (ref 0.0–7.0)
Eosinophils Absolute: 0.2 10*3/uL (ref 0.0–0.5)
HGB: 12.2 g/dL (ref 11.6–15.9)
MCH: 34.3 pg — ABNORMAL HIGH (ref 25.1–34.0)
MCV: 102.2 fL — ABNORMAL HIGH (ref 79.5–101.0)
MONO%: 11.7 % (ref 0.0–14.0)
NEUT#: 2.9 10*3/uL (ref 1.5–6.5)
RBC: 3.56 10*6/uL — ABNORMAL LOW (ref 3.70–5.45)
RDW: 14.8 % — ABNORMAL HIGH (ref 11.2–14.5)
lymph#: 0.8 10*3/uL — ABNORMAL LOW (ref 0.9–3.3)
nRBC: 0 % (ref 0–0)

## 2011-07-06 LAB — COMPREHENSIVE METABOLIC PANEL WITH GFR
ALT: 37 U/L — ABNORMAL HIGH (ref 0–35)
AST: 41 U/L — ABNORMAL HIGH (ref 0–37)
Albumin: 4.1 g/dL (ref 3.5–5.2)
Alkaline Phosphatase: 151 U/L — ABNORMAL HIGH (ref 39–117)
BUN: 18 mg/dL (ref 6–23)
CO2: 19 meq/L (ref 19–32)
Calcium: 9.2 mg/dL (ref 8.4–10.5)
Chloride: 105 meq/L (ref 96–112)
Creatinine, Ser: 0.74 mg/dL (ref 0.50–1.10)
Glucose, Bld: 103 mg/dL — ABNORMAL HIGH (ref 70–99)
Potassium: 4.2 meq/L (ref 3.5–5.3)
Sodium: 138 meq/L (ref 135–145)
Total Bilirubin: 0.4 mg/dL (ref 0.3–1.2)
Total Protein: 7.8 g/dL (ref 6.0–8.3)

## 2011-07-06 LAB — UA PROTEIN, DIPSTICK - CHCC: Protein, Urine: NEGATIVE mg/dL

## 2011-07-08 ENCOUNTER — Encounter (HOSPITAL_BASED_OUTPATIENT_CLINIC_OR_DEPARTMENT_OTHER): Payer: Medicare Other | Admitting: Oncology

## 2011-07-08 DIAGNOSIS — C189 Malignant neoplasm of colon, unspecified: Secondary | ICD-10-CM

## 2011-07-09 ENCOUNTER — Ambulatory Visit (INDEPENDENT_AMBULATORY_CARE_PROVIDER_SITE_OTHER): Payer: Medicare Other | Admitting: Family Medicine

## 2011-07-09 DIAGNOSIS — I82409 Acute embolism and thrombosis of unspecified deep veins of unspecified lower extremity: Secondary | ICD-10-CM

## 2011-07-09 DIAGNOSIS — Z7901 Long term (current) use of anticoagulants: Secondary | ICD-10-CM

## 2011-07-09 DIAGNOSIS — Z5181 Encounter for therapeutic drug level monitoring: Secondary | ICD-10-CM

## 2011-07-09 LAB — POCT INR: INR: 2.5

## 2011-07-09 NOTE — Patient Instructions (Signed)
Continue  2.5 mg daily , 5 mg Friday/Monday, recheck 2 weeks

## 2011-07-13 ENCOUNTER — Other Ambulatory Visit: Payer: Self-pay | Admitting: Family Medicine

## 2011-07-15 ENCOUNTER — Ambulatory Visit
Admission: RE | Admit: 2011-07-15 | Discharge: 2011-07-15 | Disposition: A | Discharge status: Home / Self Care | Payer: Medicare Other | Source: Ambulatory Visit | Attending: Radiation Oncology | Admitting: Radiation Oncology

## 2011-07-19 ENCOUNTER — Ambulatory Visit (INDEPENDENT_AMBULATORY_CARE_PROVIDER_SITE_OTHER): Payer: Medicare Other | Admitting: General Surgery

## 2011-07-19 ENCOUNTER — Encounter (INDEPENDENT_AMBULATORY_CARE_PROVIDER_SITE_OTHER): Payer: Self-pay | Admitting: General Surgery

## 2011-07-19 DIAGNOSIS — D0590 Unspecified type of carcinoma in situ of unspecified breast: Secondary | ICD-10-CM

## 2011-07-19 DIAGNOSIS — D0510 Intraductal carcinoma in situ of unspecified breast: Secondary | ICD-10-CM

## 2011-07-19 NOTE — Patient Instructions (Signed)
Continue regular self exams  

## 2011-07-20 ENCOUNTER — Other Ambulatory Visit: Payer: Self-pay | Admitting: Oncology

## 2011-07-20 ENCOUNTER — Encounter (HOSPITAL_BASED_OUTPATIENT_CLINIC_OR_DEPARTMENT_OTHER): Payer: Medicare Other | Admitting: Oncology

## 2011-07-20 DIAGNOSIS — Z5111 Encounter for antineoplastic chemotherapy: Secondary | ICD-10-CM

## 2011-07-20 DIAGNOSIS — C189 Malignant neoplasm of colon, unspecified: Secondary | ICD-10-CM

## 2011-07-20 DIAGNOSIS — C779 Secondary and unspecified malignant neoplasm of lymph node, unspecified: Secondary | ICD-10-CM

## 2011-07-20 DIAGNOSIS — C50919 Malignant neoplasm of unspecified site of unspecified female breast: Secondary | ICD-10-CM

## 2011-07-20 DIAGNOSIS — Z452 Encounter for adjustment and management of vascular access device: Secondary | ICD-10-CM

## 2011-07-20 LAB — CBC WITH DIFFERENTIAL/PLATELET
BASO%: 0.5 % (ref 0.0–2.0)
MCHC: 34 g/dL (ref 31.5–36.0)
MONO#: 0.5 10*3/uL (ref 0.1–0.9)
RBC: 3.39 10*6/uL — ABNORMAL LOW (ref 3.70–5.45)
RDW: 15 % — ABNORMAL HIGH (ref 11.2–14.5)
WBC: 2.2 10*3/uL — ABNORMAL LOW (ref 3.9–10.3)
lymph#: 0.6 10*3/uL — ABNORMAL LOW (ref 0.9–3.3)

## 2011-07-22 ENCOUNTER — Encounter (INDEPENDENT_AMBULATORY_CARE_PROVIDER_SITE_OTHER): Payer: Self-pay | Admitting: General Surgery

## 2011-07-22 NOTE — Progress Notes (Signed)
Subjective:     Patient ID: Tyson Dense, female   DOB: 1946-08-29, 65 y.o.   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 She was ER PR positive. She finished radiation and is now taking Aromasin and tolerating it well. Unfortunately she was also found to have metastatic colon cancer. Since her last visit she has started chemotherapy for the colon cancer. Since her last visit she also had a TIA but has completely recovered from this. She looks well now and seems to be tolerating her chemotherapy well.  Review of Systems     Objective:   Physical Exam On exam Lungs: Clear bilaterally with no use of assesory respiratory muscles Heart: Regular rate and rhythm with no pulse in the left chest Abdomen: soft and nontender with no palpable mass or hepatosplenomegaly. Breasts: Left breast incision has healed nicely. She has no palpable mass in either breast. No axillary, supraclavicular, or cervical lymphadenopathy in either side    Assessment:     2 half years out from a left lumpectomy for DCIS Metastatic colon cancer    Plan:     At this point she will continue her room as soon. She is due for her next mammogram in December. She will continue her treatment for metastatic colon cancer in the meantime. We will plan to see her back in about 6 months.

## 2011-07-23 ENCOUNTER — Ambulatory Visit (INDEPENDENT_AMBULATORY_CARE_PROVIDER_SITE_OTHER): Payer: Medicare Other | Admitting: Family Medicine

## 2011-07-23 DIAGNOSIS — I82409 Acute embolism and thrombosis of unspecified deep veins of unspecified lower extremity: Secondary | ICD-10-CM

## 2011-07-23 DIAGNOSIS — Z5181 Encounter for therapeutic drug level monitoring: Secondary | ICD-10-CM

## 2011-07-23 DIAGNOSIS — Z7901 Long term (current) use of anticoagulants: Secondary | ICD-10-CM

## 2011-07-23 LAB — POCT INR: INR: 3

## 2011-07-23 NOTE — Patient Instructions (Signed)
Continue current dose, check in 4 weeks  

## 2011-08-03 ENCOUNTER — Other Ambulatory Visit: Payer: Self-pay | Admitting: Physician Assistant

## 2011-08-03 ENCOUNTER — Other Ambulatory Visit: Payer: Self-pay | Admitting: Oncology

## 2011-08-03 ENCOUNTER — Encounter (HOSPITAL_BASED_OUTPATIENT_CLINIC_OR_DEPARTMENT_OTHER): Payer: Medicare Other | Admitting: Oncology

## 2011-08-03 DIAGNOSIS — C189 Malignant neoplasm of colon, unspecified: Secondary | ICD-10-CM

## 2011-08-03 DIAGNOSIS — C779 Secondary and unspecified malignant neoplasm of lymph node, unspecified: Secondary | ICD-10-CM

## 2011-08-03 DIAGNOSIS — Z452 Encounter for adjustment and management of vascular access device: Secondary | ICD-10-CM

## 2011-08-03 DIAGNOSIS — C50419 Malignant neoplasm of upper-outer quadrant of unspecified female breast: Secondary | ICD-10-CM

## 2011-08-03 DIAGNOSIS — C50919 Malignant neoplasm of unspecified site of unspecified female breast: Secondary | ICD-10-CM

## 2011-08-03 DIAGNOSIS — Z5111 Encounter for antineoplastic chemotherapy: Secondary | ICD-10-CM

## 2011-08-03 LAB — COMPREHENSIVE METABOLIC PANEL
AST: 43 U/L — ABNORMAL HIGH (ref 0–37)
Albumin: 4 g/dL (ref 3.5–5.2)
BUN: 17 mg/dL (ref 6–23)
CO2: 24 mEq/L (ref 19–32)
Calcium: 9.2 mg/dL (ref 8.4–10.5)
Chloride: 106 mEq/L (ref 96–112)
Glucose, Bld: 91 mg/dL (ref 70–99)
Potassium: 4.1 mEq/L (ref 3.5–5.3)

## 2011-08-03 LAB — CBC WITH DIFFERENTIAL/PLATELET
Basophils Absolute: 0 10*3/uL (ref 0.0–0.1)
Eosinophils Absolute: 0.1 10*3/uL (ref 0.0–0.5)
HCT: 37.8 % (ref 34.8–46.6)
HGB: 12.5 g/dL (ref 11.6–15.9)
LYMPH%: 10.5 % — ABNORMAL LOW (ref 14.0–49.7)
MONO#: 0.9 10*3/uL (ref 0.1–0.9)
NEUT#: 4.6 10*3/uL (ref 1.5–6.5)
NEUT%: 72.9 % (ref 38.4–76.8)
Platelets: 137 10*3/uL — ABNORMAL LOW (ref 145–400)
WBC: 6.3 10*3/uL (ref 3.9–10.3)
nRBC: 0 % (ref 0–0)

## 2011-08-18 ENCOUNTER — Other Ambulatory Visit: Payer: Self-pay | Admitting: Oncology

## 2011-08-18 ENCOUNTER — Encounter (HOSPITAL_BASED_OUTPATIENT_CLINIC_OR_DEPARTMENT_OTHER): Payer: Medicare Other | Admitting: Oncology

## 2011-08-18 DIAGNOSIS — C50919 Malignant neoplasm of unspecified site of unspecified female breast: Secondary | ICD-10-CM

## 2011-08-18 DIAGNOSIS — Z853 Personal history of malignant neoplasm of breast: Secondary | ICD-10-CM

## 2011-08-18 DIAGNOSIS — Z452 Encounter for adjustment and management of vascular access device: Secondary | ICD-10-CM

## 2011-08-18 DIAGNOSIS — C779 Secondary and unspecified malignant neoplasm of lymph node, unspecified: Secondary | ICD-10-CM

## 2011-08-18 DIAGNOSIS — C189 Malignant neoplasm of colon, unspecified: Secondary | ICD-10-CM

## 2011-08-18 DIAGNOSIS — E860 Dehydration: Secondary | ICD-10-CM

## 2011-08-18 DIAGNOSIS — Z5111 Encounter for antineoplastic chemotherapy: Secondary | ICD-10-CM

## 2011-08-18 LAB — CBC WITH DIFFERENTIAL/PLATELET
EOS%: 5.2 % (ref 0.0–7.0)
Eosinophils Absolute: 0.2 10*3/uL (ref 0.0–0.5)
MCV: 100.6 fL (ref 79.5–101.0)
MONO%: 11.4 % (ref 0.0–14.0)
NEUT#: 2.9 10*3/uL (ref 1.5–6.5)
RBC: 3.49 10*6/uL — ABNORMAL LOW (ref 3.70–5.45)
RDW: 14.6 % — ABNORMAL HIGH (ref 11.2–14.5)

## 2011-08-20 ENCOUNTER — Ambulatory Visit (INDEPENDENT_AMBULATORY_CARE_PROVIDER_SITE_OTHER): Payer: Medicare Other | Admitting: Family Medicine

## 2011-08-20 DIAGNOSIS — Z5181 Encounter for therapeutic drug level monitoring: Secondary | ICD-10-CM

## 2011-08-20 DIAGNOSIS — Z7901 Long term (current) use of anticoagulants: Secondary | ICD-10-CM

## 2011-08-20 DIAGNOSIS — I82409 Acute embolism and thrombosis of unspecified deep veins of unspecified lower extremity: Secondary | ICD-10-CM

## 2011-08-20 LAB — POCT INR: INR: 2.1

## 2011-08-20 NOTE — Patient Instructions (Signed)
Increase to 2.5 mg daily , 5 mg M/W/F, recheck 2 weeks

## 2011-08-23 ENCOUNTER — Encounter (HOSPITAL_COMMUNITY)
Admission: RE | Admit: 2011-08-23 | Discharge: 2011-08-23 | Disposition: A | Discharge status: Home / Self Care | Payer: Medicare Other | Source: Ambulatory Visit | Attending: Oncology | Admitting: Oncology

## 2011-08-23 DIAGNOSIS — Z79899 Other long term (current) drug therapy: Secondary | ICD-10-CM | POA: Insufficient documentation

## 2011-08-23 DIAGNOSIS — Z7901 Long term (current) use of anticoagulants: Secondary | ICD-10-CM | POA: Insufficient documentation

## 2011-08-23 DIAGNOSIS — C189 Malignant neoplasm of colon, unspecified: Secondary | ICD-10-CM | POA: Insufficient documentation

## 2011-08-23 LAB — GLUCOSE, CAPILLARY: Glucose-Capillary: 103 mg/dL — ABNORMAL HIGH (ref 70–99)

## 2011-08-23 MED ORDER — FLUDEOXYGLUCOSE F - 18 (FDG) INJECTION
16.7000 | Freq: Once | INTRAVENOUS | Status: AC | PRN
  Administered 2011-08-23: 16.7 via INTRAVENOUS

## 2011-09-02 ENCOUNTER — Ambulatory Visit: Payer: Medicare Other

## 2011-09-03 ENCOUNTER — Ambulatory Visit: Payer: Medicare Other

## 2011-09-06 ENCOUNTER — Ambulatory Visit (INDEPENDENT_AMBULATORY_CARE_PROVIDER_SITE_OTHER): Payer: Medicare Other | Admitting: Family Medicine

## 2011-09-06 DIAGNOSIS — Z7901 Long term (current) use of anticoagulants: Secondary | ICD-10-CM

## 2011-09-06 DIAGNOSIS — I82409 Acute embolism and thrombosis of unspecified deep veins of unspecified lower extremity: Secondary | ICD-10-CM

## 2011-09-06 DIAGNOSIS — Z5181 Encounter for therapeutic drug level monitoring: Secondary | ICD-10-CM

## 2011-09-06 LAB — POCT INR: INR: 2.1

## 2011-09-06 NOTE — Patient Instructions (Signed)
Increase to 5 mg daily ,2.5 mg T,TH,sat, recheck 2 weeks

## 2011-09-10 ENCOUNTER — Other Ambulatory Visit (INDEPENDENT_AMBULATORY_CARE_PROVIDER_SITE_OTHER): Payer: Medicare Other

## 2011-09-10 DIAGNOSIS — E7800 Pure hypercholesterolemia, unspecified: Secondary | ICD-10-CM

## 2011-09-10 DIAGNOSIS — G459 Transient cerebral ischemic attack, unspecified: Secondary | ICD-10-CM

## 2011-09-10 DIAGNOSIS — E119 Type 2 diabetes mellitus without complications: Secondary | ICD-10-CM

## 2011-09-10 LAB — LIPID PANEL
HDL: 37.6 mg/dL — ABNORMAL LOW (ref >39.00)
Total CHOL/HDL Ratio: 4

## 2011-09-10 LAB — COMPREHENSIVE METABOLIC PANEL
ALT: 37 U/L — ABNORMAL HIGH (ref 0–35)
AST: 38 U/L — ABNORMAL HIGH (ref 0–37)
CO2: 29 mEq/L (ref 19–32)
Calcium: 9.5 mg/dL (ref 8.4–10.5)
Chloride: 100 mEq/L (ref 96–112)
GFR: 62.29 mL/min (ref >60.00)
Potassium: 3.9 mEq/L (ref 3.5–5.1)
Sodium: 137 mEq/L (ref 135–145)
Total Protein: 8.7 g/dL — ABNORMAL HIGH (ref 6.0–8.3)

## 2011-09-10 LAB — HEMOGLOBIN A1C: Hgb A1c MFr Bld: 6.4 % (ref 4.6–6.5)

## 2011-09-17 ENCOUNTER — Ambulatory Visit (INDEPENDENT_AMBULATORY_CARE_PROVIDER_SITE_OTHER): Payer: Medicare Other | Admitting: Family Medicine

## 2011-09-17 ENCOUNTER — Other Ambulatory Visit: Payer: Self-pay | Admitting: Oncology

## 2011-09-17 ENCOUNTER — Encounter: Payer: Self-pay | Admitting: Family Medicine

## 2011-09-17 ENCOUNTER — Encounter (HOSPITAL_BASED_OUTPATIENT_CLINIC_OR_DEPARTMENT_OTHER): Payer: Medicare Other | Admitting: Oncology

## 2011-09-17 DIAGNOSIS — C189 Malignant neoplasm of colon, unspecified: Secondary | ICD-10-CM

## 2011-09-17 DIAGNOSIS — E860 Dehydration: Secondary | ICD-10-CM

## 2011-09-17 DIAGNOSIS — I10 Essential (primary) hypertension: Secondary | ICD-10-CM

## 2011-09-17 DIAGNOSIS — G459 Transient cerebral ischemic attack, unspecified: Secondary | ICD-10-CM

## 2011-09-17 DIAGNOSIS — E7800 Pure hypercholesterolemia, unspecified: Secondary | ICD-10-CM

## 2011-09-17 DIAGNOSIS — E119 Type 2 diabetes mellitus without complications: Secondary | ICD-10-CM

## 2011-09-17 DIAGNOSIS — Z853 Personal history of malignant neoplasm of breast: Secondary | ICD-10-CM

## 2011-09-17 DIAGNOSIS — R7989 Other specified abnormal findings of blood chemistry: Secondary | ICD-10-CM

## 2011-09-17 LAB — CEA: CEA: 0.8 ng/mL (ref 0.0–5.0)

## 2011-09-17 NOTE — Assessment & Plan Note (Signed)
Chemo currently held due to high BP during infusion.

## 2011-09-17 NOTE — Assessment & Plan Note (Signed)
No further epsiodes... Okay to hold on Cardiology follow up as pt requested.

## 2011-09-17 NOTE — Patient Instructions (Signed)
Fish oil 2000 mg divided daily.. If tolerated. Continue simvastatin at current dose. Work on low carb diet, increase fruits and veggies as able. Follow up in 6 months for AMW with fasting labs prior. Hold on shingles vaccine for now given still immunocompromised.

## 2011-09-17 NOTE — Assessment & Plan Note (Signed)
No other BP elevations. Well controlled. Continue current medication.

## 2011-09-17 NOTE — Assessment & Plan Note (Signed)
Improved.. Likely due to chemo and other meds.

## 2011-09-17 NOTE — Assessment & Plan Note (Signed)
Worsened control but still at goal.. No change in diet per pt. Encouraged exercise, weight loss, healthy eating habits.  May be due to meds/chemo.

## 2011-09-17 NOTE — Assessment & Plan Note (Signed)
LDL at goal <100.... Trig still high, she will restart fish oil if tolerated.

## 2011-09-17 NOTE — Progress Notes (Signed)
  Subjective:    Patient ID: Susan Duffy, female    DOB: 22-Jul-1946, 65 y.o.   MRN: [id number]  HPI  Diabetes:  Using medications without difficulties: Hypoglycemic episodes: Hyperglycemic episodes: Feet problems: Blood Sugars averaging: eye exam within last year:  TIA (transient ischemic attack) in setting of flushing her port.  TIA with normal echo and carotids  Cont anticoag with coumadin.  Good BP control and statin   Elevated Cholesterol: Goal per cards <100. Using medications without problems:Yes.. Stopped fish oil while on chemo for nausea.. Can restart now Muscle aches: None Diet compliance: Good Exercise: Good Other complaints:  Breast cancer follow up... Saw Dr. Carolynne Edouard...stable Mammogram in 11/2011.  Colon cancer... Has held chemo for a while for high BPs and flushing.Marland Kitchen Unfortunately she has started losing hair. Plan to restart after first of year.  Elevated LFTs imprved in last test since off chemo, almost normal.   Review of Systems  Constitutional: Negative for fever and fatigue.  Respiratory: Positive for shortness of breath. Negative for wheezing.        Stable SOB since surgery.  Cardiovascular: Negative for chest pain, palpitations and leg swelling.       Objective:   Physical Exam  Constitutional: Vital signs are normal. She appears well-developed and well-nourished. She is cooperative.  Non-toxic appearance. She does not appear ill. No distress.       Losing hair  HENT:  Head: Normocephalic.  Right Ear: Hearing, tympanic membrane, external ear and ear canal normal. Tympanic membrane is not erythematous, not retracted and not bulging.  Left Ear: Hearing, tympanic membrane, external ear and ear canal normal. Tympanic membrane is not erythematous, not retracted and not bulging.  Nose: No mucosal edema or rhinorrhea. Right sinus exhibits no maxillary sinus tenderness and no frontal sinus tenderness. Left sinus exhibits no maxillary sinus tenderness  and no frontal sinus tenderness.  Mouth/Throat: Uvula is midline, oropharynx is clear and moist and mucous membranes are normal.  Eyes: Conjunctivae, EOM and lids are normal. Pupils are equal, round, and reactive to light. No foreign bodies found.  Neck: Trachea normal and normal range of motion. Neck supple. Carotid bruit is not present. No mass and no thyromegaly present.  Cardiovascular: Normal rate, regular rhythm, S1 normal, S2 normal, normal heart sounds, intact distal pulses and normal pulses.  Exam reveals no gallop and no friction rub.   No murmur heard. Pulmonary/Chest: Effort normal and breath sounds normal. Not tachypneic. No respiratory distress. She has no decreased breath sounds. She has no wheezes. She has no rhonchi. She has no rales.  Abdominal: Soft. Normal appearance and bowel sounds are normal. There is no tenderness.  Neurological: She is alert.  Skin: Skin is warm, dry and intact. No rash noted.  Psychiatric: Her speech is normal and behavior is normal. Judgment and thought content normal. Her mood appears not anxious. Cognition and memory are normal. She does not exhibit a depressed mood.   Diabetic foot exam: Normal inspection No skin breakdown No calluses  Normal DP pulses Normal sensation to light touch and monofilament Nails normal        Assessment & Plan:

## 2011-09-20 ENCOUNTER — Ambulatory Visit (INDEPENDENT_AMBULATORY_CARE_PROVIDER_SITE_OTHER): Payer: Medicare Other | Admitting: Family Medicine

## 2011-09-20 DIAGNOSIS — I82409 Acute embolism and thrombosis of unspecified deep veins of unspecified lower extremity: Secondary | ICD-10-CM

## 2011-09-20 DIAGNOSIS — Z7901 Long term (current) use of anticoagulants: Secondary | ICD-10-CM

## 2011-09-20 DIAGNOSIS — Z5181 Encounter for therapeutic drug level monitoring: Secondary | ICD-10-CM

## 2011-09-20 LAB — POCT INR: INR: 2.4

## 2011-09-20 NOTE — Patient Instructions (Signed)
Continue current dose, check in 4 weeks  

## 2011-09-21 ENCOUNTER — Other Ambulatory Visit: Payer: Self-pay | Admitting: Oncology

## 2011-09-21 DIAGNOSIS — C189 Malignant neoplasm of colon, unspecified: Secondary | ICD-10-CM

## 2011-09-21 MED ORDER — HEPARIN (PORCINE) LOCK FLUSH 10 UNIT/ML IV SOLN: 10.0000 [IU] | Freq: Once | INTRAVENOUS | Status: AC

## 2011-10-11 ENCOUNTER — Ambulatory Visit (INDEPENDENT_AMBULATORY_CARE_PROVIDER_SITE_OTHER): Payer: Medicare Other | Admitting: Family Medicine

## 2011-10-11 ENCOUNTER — Ambulatory Visit (HOSPITAL_BASED_OUTPATIENT_CLINIC_OR_DEPARTMENT_OTHER): Payer: Medicare Other

## 2011-10-11 DIAGNOSIS — Z452 Encounter for adjustment and management of vascular access device: Secondary | ICD-10-CM

## 2011-10-11 DIAGNOSIS — Z5181 Encounter for therapeutic drug level monitoring: Secondary | ICD-10-CM

## 2011-10-11 DIAGNOSIS — C189 Malignant neoplasm of colon, unspecified: Secondary | ICD-10-CM

## 2011-10-11 DIAGNOSIS — Z7901 Long term (current) use of anticoagulants: Secondary | ICD-10-CM

## 2011-10-11 DIAGNOSIS — I82409 Acute embolism and thrombosis of unspecified deep veins of unspecified lower extremity: Secondary | ICD-10-CM

## 2011-10-11 MED ORDER — SODIUM CHLORIDE 0.9 % IJ SOLN
10.0000 mL | Freq: Once | INTRAMUSCULAR | Status: AC
  Administered 2011-10-11: 10 mL via INTRAVENOUS

## 2011-10-11 MED ORDER — HEPARIN SOD (PORK) LOCK FLUSH 100 UNIT/ML IV SOLN
500.0000 [IU] | Freq: Once | INTRAVENOUS | Status: AC
  Administered 2011-10-11: 500 [IU] via INTRAVENOUS

## 2011-10-11 NOTE — Progress Notes (Signed)
Addended by: Annia Belt on: 10/11/2011 04:29 PM   Modules accepted: Orders

## 2011-10-11 NOTE — Patient Instructions (Signed)
Hold today then 2.5 mg daily ,5 mg T,TH,sat recheck 1 week

## 2011-10-15 ENCOUNTER — Ambulatory Visit (INDEPENDENT_AMBULATORY_CARE_PROVIDER_SITE_OTHER): Payer: Medicare Other | Admitting: Family Medicine

## 2011-10-15 DIAGNOSIS — Z5181 Encounter for therapeutic drug level monitoring: Secondary | ICD-10-CM

## 2011-10-15 DIAGNOSIS — Z7901 Long term (current) use of anticoagulants: Secondary | ICD-10-CM

## 2011-10-15 DIAGNOSIS — I82409 Acute embolism and thrombosis of unspecified deep veins of unspecified lower extremity: Secondary | ICD-10-CM

## 2011-10-15 NOTE — Patient Instructions (Signed)
Decrease to 2.5 mg daily ,5 mg T,TH recheck 2 week

## 2011-10-29 ENCOUNTER — Ambulatory Visit (INDEPENDENT_AMBULATORY_CARE_PROVIDER_SITE_OTHER): Payer: Medicare Other | Admitting: Family Medicine

## 2011-10-29 DIAGNOSIS — I82409 Acute embolism and thrombosis of unspecified deep veins of unspecified lower extremity: Secondary | ICD-10-CM

## 2011-10-29 DIAGNOSIS — Z7901 Long term (current) use of anticoagulants: Secondary | ICD-10-CM

## 2011-10-29 DIAGNOSIS — Z5181 Encounter for therapeutic drug level monitoring: Secondary | ICD-10-CM

## 2011-10-29 LAB — POCT INR: INR: 2.6

## 2011-10-29 NOTE — Patient Instructions (Signed)
Continue current dose, check in 4 weeks  

## 2011-11-01 ENCOUNTER — Other Ambulatory Visit: Payer: Self-pay | Admitting: Family Medicine

## 2011-11-01 DIAGNOSIS — C50919 Malignant neoplasm of unspecified site of unspecified female breast: Secondary | ICD-10-CM

## 2011-11-01 DIAGNOSIS — Z9889 Other specified postprocedural states: Secondary | ICD-10-CM

## 2011-11-12 ENCOUNTER — Other Ambulatory Visit: Payer: Self-pay | Admitting: Family Medicine

## 2011-11-19 ENCOUNTER — Ambulatory Visit (INDEPENDENT_AMBULATORY_CARE_PROVIDER_SITE_OTHER): Payer: Medicare Other | Admitting: Family Medicine

## 2011-11-19 DIAGNOSIS — Z7901 Long term (current) use of anticoagulants: Secondary | ICD-10-CM

## 2011-11-19 DIAGNOSIS — Z5181 Encounter for therapeutic drug level monitoring: Secondary | ICD-10-CM

## 2011-11-19 DIAGNOSIS — I82409 Acute embolism and thrombosis of unspecified deep veins of unspecified lower extremity: Secondary | ICD-10-CM

## 2011-11-19 LAB — POCT INR: INR: 2

## 2011-11-19 NOTE — Patient Instructions (Signed)
Continue current dose, check in 4 weeks, Patient is eating better,will cut back on greens.

## 2011-11-24 ENCOUNTER — Other Ambulatory Visit: Payer: Self-pay | Admitting: Oncology

## 2011-11-24 DIAGNOSIS — C50419 Malignant neoplasm of upper-outer quadrant of unspecified female breast: Secondary | ICD-10-CM

## 2011-11-26 ENCOUNTER — Ambulatory Visit: Payer: Medicare Other

## 2011-12-01 ENCOUNTER — Ambulatory Visit
Admission: RE | Admit: 2011-12-01 | Discharge: 2011-12-01 | Disposition: A | Discharge status: Home / Self Care | Payer: Medicare Other | Source: Ambulatory Visit | Attending: Family Medicine | Admitting: Family Medicine

## 2011-12-01 DIAGNOSIS — Z9889 Other specified postprocedural states: Secondary | ICD-10-CM

## 2011-12-01 DIAGNOSIS — C50919 Malignant neoplasm of unspecified site of unspecified female breast: Secondary | ICD-10-CM

## 2011-12-04 ENCOUNTER — Telehealth: Payer: Self-pay | Admitting: Oncology

## 2011-12-04 NOTE — Telephone Encounter (Signed)
S/w the pt and she is aware of her flush appt in jan and the md appt for feb 2013.

## 2011-12-06 ENCOUNTER — Telehealth: Payer: Self-pay | Admitting: *Deleted

## 2011-12-06 NOTE — Telephone Encounter (Signed)
Notified pt mammo looks good. Pt inquired about CT CAP appt date/time.  No future appts scheduled at this time. Per MD pt is to have CT CAP prior to next MD appt Feb 2013

## 2011-12-06 NOTE — Telephone Encounter (Signed)
Message copied by Cooper Render on Mon Dec 06, 2011 12:22 PM ------      Message from: Victorino December      Created: Wed Dec 01, 2011  1:19 PM       Call patient: Susan Duffy looks good

## 2011-12-08 ENCOUNTER — Other Ambulatory Visit: Payer: Self-pay | Admitting: Family Medicine

## 2011-12-09 ENCOUNTER — Ambulatory Visit (HOSPITAL_BASED_OUTPATIENT_CLINIC_OR_DEPARTMENT_OTHER): Payer: Medicare Other

## 2011-12-09 DIAGNOSIS — Z452 Encounter for adjustment and management of vascular access device: Secondary | ICD-10-CM

## 2011-12-09 DIAGNOSIS — C189 Malignant neoplasm of colon, unspecified: Secondary | ICD-10-CM

## 2011-12-09 DIAGNOSIS — C50919 Malignant neoplasm of unspecified site of unspecified female breast: Secondary | ICD-10-CM

## 2011-12-09 MED ORDER — SODIUM CHLORIDE 0.9 % IJ SOLN
10.0000 mL | INTRAMUSCULAR | Status: DC | PRN
  Administered 2011-12-09: 10 mL via INTRAVENOUS
  Filled 2011-12-09: qty 10

## 2011-12-09 MED ORDER — HEPARIN SOD (PORK) LOCK FLUSH 100 UNIT/ML IV SOLN
500.0000 [IU] | Freq: Once | INTRAVENOUS | Status: AC
  Administered 2011-12-09: 500 [IU] via INTRAVENOUS
  Filled 2011-12-09: qty 5

## 2011-12-09 NOTE — Patient Instructions (Signed)
Call MD for problems 

## 2011-12-13 LAB — HM DIABETES EYE EXAM

## 2011-12-16 ENCOUNTER — Ambulatory Visit (INDEPENDENT_AMBULATORY_CARE_PROVIDER_SITE_OTHER): Payer: Medicare Other | Admitting: Family Medicine

## 2011-12-16 ENCOUNTER — Ambulatory Visit (INDEPENDENT_AMBULATORY_CARE_PROVIDER_SITE_OTHER)
Admission: RE | Admit: 2011-12-16 | Discharge: 2011-12-16 | Disposition: A | Discharge status: Home / Self Care | Payer: Medicare Other | Source: Ambulatory Visit | Attending: Family Medicine | Admitting: Family Medicine

## 2011-12-16 ENCOUNTER — Encounter: Payer: Self-pay | Admitting: Family Medicine

## 2011-12-16 DIAGNOSIS — I82409 Acute embolism and thrombosis of unspecified deep veins of unspecified lower extremity: Secondary | ICD-10-CM

## 2011-12-16 DIAGNOSIS — Z7901 Long term (current) use of anticoagulants: Secondary | ICD-10-CM | POA: Diagnosis not present

## 2011-12-16 DIAGNOSIS — Z5181 Encounter for therapeutic drug level monitoring: Secondary | ICD-10-CM

## 2011-12-16 DIAGNOSIS — M546 Pain in thoracic spine: Secondary | ICD-10-CM | POA: Diagnosis not present

## 2011-12-16 DIAGNOSIS — R0602 Shortness of breath: Secondary | ICD-10-CM | POA: Diagnosis not present

## 2011-12-16 DIAGNOSIS — Z85038 Personal history of other malignant neoplasm of large intestine: Secondary | ICD-10-CM | POA: Diagnosis not present

## 2011-12-16 DIAGNOSIS — R109 Unspecified abdominal pain: Secondary | ICD-10-CM

## 2011-12-16 DIAGNOSIS — R0609 Other forms of dyspnea: Secondary | ICD-10-CM | POA: Insufficient documentation

## 2011-12-16 DIAGNOSIS — Z853 Personal history of malignant neoplasm of breast: Secondary | ICD-10-CM | POA: Diagnosis not present

## 2011-12-16 LAB — POCT URINALYSIS DIPSTICK
Bilirubin, UA: NEGATIVE
Blood, UA: NEGATIVE
Glucose, UA: NEGATIVE
Ketones, UA: NEGATIVE
Leukocytes, UA: NEGATIVE
Nitrite, UA: NEGATIVE
Protein, UA: NEGATIVE
Spec Grav, UA: 1.02
Urobilinogen, UA: NEGATIVE
pH, UA: 6

## 2011-12-16 LAB — POCT INR: INR: 1.6

## 2011-12-16 MED ORDER — CYCLOBENZAPRINE HCL 10 MG PO TABS: 5.0000 mg | ORAL_TABLET | Freq: Every evening | ORAL | Status: AC | PRN

## 2011-12-16 NOTE — Assessment & Plan Note (Addendum)
CXR: appears stable, will await radiologist review. No clear sign of pneumonia. Has history of DVT,on coumadin.. No clear sign of new DVT or PE but at high risk. No hypoxia. Has Chest CT scheduled in next month with Dr. Welton Flakes.  Pt will let us know if SOB worsens in short-term for further earlier eval.

## 2011-12-16 NOTE — Assessment & Plan Note (Signed)
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  Muscle relaxant prn.  Start home thoracic exercises.

## 2011-12-16 NOTE — Assessment & Plan Note (Addendum)
And thoracic pain.  UA clear, no sign of infection or blood in urine. No sign of rash or skin hypersensitivity suggesting zoster.  ? Due to MSK strain. No vertebral focal ttp.

## 2011-12-16 NOTE — Patient Instructions (Signed)
Start home upper back stretching exercises. Use muscle relaxant as needed. We will call with X-ray reading from radiologist. Call if fever or increase in shortness of breath in next week for re-eval.

## 2011-12-16 NOTE — Patient Instructions (Addendum)
2.5 mg daily ,5 mg T,TH,SAT ( increased dose today by 2.5 mg weekly) recheck in 2 weeks

## 2011-12-16 NOTE — Progress Notes (Signed)
  Subjective:    Patient ID: Susan Duffy, female    DOB: 06-27-1946, 66 y.o.   MRN: [id number]  HPI  66 year old female with met colon cancer, hx of breast cancer, HTN, DVT ( on coumadin) and DM presents with 1 week history of pain in right thoracic back radiates occ around to right anterior rib cage/upper waist with deep breaths. Pain comes and goes, 8/10 on pain scale.  Minor exertion makes her SOB, this is a noticeable change in the last week.  No rash. No leg swelling pain.No change in fatigue, no flu like illness. No dysuria No falls, no known injury. Has been having some lingering nasal congestion, head cold. No cough. No fever , did have 100 temp at beginning of head cold symptoms around 12/23. Was sore after mammogram under left breast 12/26.. No bruising.   Hx of lobectomy, left lower 06/2009 since then mild SOB with exertion.  She is currently on break from chemotherapy given BP changes. Last given 08/2011. Neutrophils were nml 08/2011.    Review of Systems     Objective:   Physical Exam  Constitutional: Vital signs are normal. She appears well-developed and well-nourished. She is cooperative.  Non-toxic appearance. She does not appear ill. No distress.  HENT:  Head: Normocephalic.  Right Ear: Hearing, tympanic membrane, external ear and ear canal normal. Tympanic membrane is not erythematous, not retracted and not bulging.  Left Ear: Hearing, tympanic membrane, external ear and ear canal normal. Tympanic membrane is not erythematous, not retracted and not bulging.  Nose: No mucosal edema or rhinorrhea. Right sinus exhibits no maxillary sinus tenderness and no frontal sinus tenderness. Left sinus exhibits no maxillary sinus tenderness and no frontal sinus tenderness.  Mouth/Throat: Uvula is midline, oropharynx is clear and moist and mucous membranes are normal.  Eyes: Conjunctivae, EOM and lids are normal. Pupils are equal, round, and reactive to light. No foreign bodies  found.  Neck: Trachea normal and normal range of motion. Neck supple. Carotid bruit is not present. No mass and no thyromegaly present.  Cardiovascular: Normal rate, regular rhythm, S1 normal, S2 normal, normal heart sounds, intact distal pulses and normal pulses.  Exam reveals no gallop and no friction rub.   No murmur heard. Pulmonary/Chest: Effort normal and breath sounds normal. Not tachypneic. No respiratory distress. She has no decreased breath sounds. She has no wheezes. She has no rhonchi. She has no rales.  Abdominal: Soft. Normal appearance and bowel sounds are normal. There is no hepatosplenomegaly. There is no tenderness. There is no CVA tenderness.  Musculoskeletal:       No focal vertebral ttp. Neg Spurlings. TTp over left paraspinous muscle and left flank.  Neurological: She is alert. She has normal strength.  Skin: Skin is warm, dry and intact. No rash noted.  Psychiatric: Her speech is normal and behavior is normal. Judgment and thought content normal. Her mood appears not anxious. Cognition and memory are normal. She does not exhibit a depressed mood.          Assessment & Plan:

## 2011-12-17 ENCOUNTER — Ambulatory Visit: Payer: Medicare Other

## 2011-12-30 ENCOUNTER — Ambulatory Visit (INDEPENDENT_AMBULATORY_CARE_PROVIDER_SITE_OTHER): Payer: Medicare Other | Admitting: Family Medicine

## 2011-12-30 DIAGNOSIS — Z7901 Long term (current) use of anticoagulants: Secondary | ICD-10-CM

## 2011-12-30 DIAGNOSIS — I82409 Acute embolism and thrombosis of unspecified deep veins of unspecified lower extremity: Secondary | ICD-10-CM | POA: Diagnosis not present

## 2011-12-30 DIAGNOSIS — Z5181 Encounter for therapeutic drug level monitoring: Secondary | ICD-10-CM | POA: Diagnosis not present

## 2011-12-30 LAB — POCT INR: INR: 2.6

## 2011-12-30 NOTE — Patient Instructions (Signed)
Continue current dose, check in 4 weeks  

## 2012-01-06 ENCOUNTER — Telehealth: Payer: Self-pay | Admitting: Family Medicine

## 2012-01-06 DIAGNOSIS — E119 Type 2 diabetes mellitus without complications: Secondary | ICD-10-CM

## 2012-01-06 DIAGNOSIS — E7800 Pure hypercholesterolemia, unspecified: Secondary | ICD-10-CM

## 2012-01-06 DIAGNOSIS — R7989 Other specified abnormal findings of blood chemistry: Secondary | ICD-10-CM

## 2012-01-06 DIAGNOSIS — I10 Essential (primary) hypertension: Secondary | ICD-10-CM

## 2012-01-06 NOTE — Telephone Encounter (Signed)
Message copied by Excell Seltzer on Thu Jan 06, 2012 10:03 PM ------      Message from: Alvina Chou      Created: Thu Jan 06, 2012 11:56 AM       Labs for April

## 2012-01-11 DIAGNOSIS — H25019 Cortical age-related cataract, unspecified eye: Secondary | ICD-10-CM | POA: Diagnosis not present

## 2012-01-14 ENCOUNTER — Ambulatory Visit (HOSPITAL_BASED_OUTPATIENT_CLINIC_OR_DEPARTMENT_OTHER): Payer: Medicare Other | Admitting: Oncology

## 2012-01-14 ENCOUNTER — Telehealth: Payer: Self-pay | Admitting: *Deleted

## 2012-01-14 ENCOUNTER — Other Ambulatory Visit: Payer: Medicare Other

## 2012-01-14 VITALS — BP 114/75 | HR 77 | Temp 98.4°F | Ht 63.5 in | Wt 184.9 lb

## 2012-01-14 DIAGNOSIS — D0590 Unspecified type of carcinoma in situ of unspecified breast: Secondary | ICD-10-CM

## 2012-01-14 DIAGNOSIS — C189 Malignant neoplasm of colon, unspecified: Secondary | ICD-10-CM

## 2012-01-14 DIAGNOSIS — C7800 Secondary malignant neoplasm of unspecified lung: Secondary | ICD-10-CM

## 2012-01-14 DIAGNOSIS — C50419 Malignant neoplasm of upper-outer quadrant of unspecified female breast: Secondary | ICD-10-CM | POA: Diagnosis not present

## 2012-01-14 DIAGNOSIS — Z79811 Long term (current) use of aromatase inhibitors: Secondary | ICD-10-CM

## 2012-01-14 DIAGNOSIS — Z853 Personal history of malignant neoplasm of breast: Secondary | ICD-10-CM | POA: Diagnosis not present

## 2012-01-14 LAB — COMPREHENSIVE METABOLIC PANEL
ALT: 25 U/L (ref 0–35)
AST: 27 U/L (ref 0–37)
Creatinine, Ser: 0.88 mg/dL (ref 0.50–1.10)
Total Bilirubin: 0.3 mg/dL (ref 0.3–1.2)

## 2012-01-14 LAB — CBC WITH DIFFERENTIAL/PLATELET
BASO%: 0.1 % (ref 0.0–2.0)
EOS%: 2.5 % (ref 0.0–7.0)
MCH: 33.6 pg (ref 25.1–34.0)
MCHC: 34.5 g/dL (ref 31.5–36.0)
MONO#: 0.5 10*3/uL (ref 0.1–0.9)
RDW: 13.4 % (ref 11.2–14.5)
WBC: 5.7 10*3/uL (ref 3.9–10.3)
lymph#: 0.6 10*3/uL — ABNORMAL LOW (ref 0.9–3.3)

## 2012-01-14 NOTE — Telephone Encounter (Signed)
gave patient appointment for ct cap on 03-13-2012 gave patient instructions and the contrast also made patient appointment to come back and see dr.khan on 03-28-2012

## 2012-01-17 NOTE — Progress Notes (Signed)
OFFICE PROGRESS NOTE  CC  [name], MD, MD [address] 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 Thus far she has received 8 cycles of FOLFOX Avastin. Most recently we discontinued it due to side effects and she has been on observation only.  #2 patient also has DCIS of the left breast she underwent a lumpectomy followed by radiation and now is on Aromasin 25 mg daily.  #3 patient previously was on tamoxifen and unfortunate she developed a deep venous thrombosis. She is now on Coumadin to titrate her INR between 2 and 2.5. This is being managed by [place] [facility] at [facility].  CURRENT THERAPY:Aromasin 25 mg daily and observation for colon cancer  INTERVAL HISTORY: Susan Duffy 66 y.o. female returns for Followup visit today. She's had a really good 4 months. Her last visit with me was October 2012. She clinically has no recurrence of disease. She is feeling well she does still have some neuropathic type of pain in her fingers and toes but she does say that it is not any worse and in fact maybe a little bit better. She denies any nausea vomiting fevers chills or night sweats. She has no myalgias or arthralgias. Remainder of the 10 point review of systems is negative.  MEDICAL HISTORY: Past Medical History  Diagnosis Date  . Anemia     iron defiency  . Arthritis     osteoarthritis  . Allergy   . TIA (transient ischemic attack) 03/08/2011  . Hypertension   . Cancer 12/25/08    Left DCIS,ERPR+,Metastatic colon     ALLERGIES:  is allergic to amoxicillin and  erythromycin.  MEDICATIONS:  Current Outpatient Prescriptions  Medication Sig Dispense Refill  . acetaminophen (TYLENOL) 500 MG tablet Take 1-2 by mouth every 6 hours as needed        . aspirin 81 MG tablet Take 81 mg by mouth daily.        . Cyanocobalamin (VITAMIN B-12) 2000 MCG TBCR Take 1 tablet by mouth daily.        Marland Kitchen exemestane (AROMASIN) 25 MG tablet TAKE 1 TABLET DAILY AFTER A MEAL.  90 tablet  0  . irbesartan (AVAPRO) 150 MG tablet TAKE 1 TABLET BY MOUTH EVERY DAY  90 tablet  1  . simvastatin (ZOCOR) 20 MG tablet Take 1 tablet (20 mg total) by mouth at bedtime.  90 tablet  2  . warfarin (COUMADIN) 2.5 MG tablet Take 2.5 mg by mouth. As directed per Heme Onc office       . warfarin (COUMADIN) 5 MG tablet USE AS DIRECTED BY COUMADIN CLINIC  30 tablet  3  . loperamide (IMODIUM) 2 MG capsule Take 2 mg by mouth 4 (four) times daily as needed.        . ondansetron (ZOFRAN) 8 MG tablet Take by mouth every 12 (twelve) hours as needed.        . prochlorperazine (COMPAZINE) 10 MG tablet Take 10 mg by mouth every 6 (six) hours as needed.          SURGICAL HISTORY:  Past  Surgical History  Procedure Date  . Colon surgery     colectomy, partial  . Breast biopsy 1-10    fibroma removal, s/p lumpectomy and radiation   . Lobectomy 06-2009    metastatic colon cancer  . Breast lumpectomy 2010    left breast    REVIEW OF SYSTEMS:  Pertinent items are noted in HPI.   PHYSICAL EXAMINATION: General appearance: alert, cooperative and appears stated age Neck: no adenopathy, no carotid bruit, no JVD, supple, symmetrical, trachea midline and thyroid not enlarged, symmetric, no tenderness/mass/nodules Lymph nodes: Cervical, supraclavicular, and axillary nodes normal. Resp: clear to auscultation bilaterally and normal percussion bilaterally Back: symmetric, no curvature. ROM normal. No CVA tenderness. Abdomen: Soft nontender nondistended bowel sounds are present no other splenomegaly. Extremities:  No edema clubbing or cyanosis. Neuro: Alert oriented strength is symmetrical in upper and lower extremities otherwise nonfocal.  ECOG PERFORMANCE STATUS: 0 - Asymptomatic  Blood pressure 114/75, pulse 77, temperature 98.4 F (36.9 C), temperature source Oral, height 5' 3.5" (1.613 m), weight 184 lb 14.4 oz (83.87 kg).  LABORATORY DATA: Lab Results  Component Value Date   WBC 5.7 01/14/2012   HGB 13.0 01/14/2012   HCT 37.7 01/14/2012   MCV 97.3 01/14/2012   PLT 223 01/14/2012      Chemistry      Component Value Date/Time   NA 139 01/14/2012 1114   NA 142 10/02/2010 1402   K 3.7 01/14/2012 1114   K 4.1 10/02/2010 1402   CL 103 01/14/2012 1114   CL 102 10/02/2010 1402   CO2 26 01/14/2012 1114   CO2 29 10/02/2010 1402   BUN 25* 01/14/2012 1114   BUN 22 10/02/2010 1402   CREATININE 0.88 01/14/2012 1114   CREATININE 0.8 10/02/2010 1402      Component Value Date/Time   CALCIUM 9.5 01/14/2012 1114   CALCIUM 9.8 10/02/2010 1402   ALKPHOS 122* 01/14/2012 1114   ALKPHOS 117* 10/02/2010 1402   AST 27 01/14/2012 1114   AST 32 10/02/2010 1402   ALT 25 01/14/2012 1114   BILITOT 0.3 01/14/2012 1114   BILITOT 0.60 10/02/2010 1402       RADIOGRAPHIC STUDIES:  No results found.  ASSESSMENT: 66 year old female with  #1 metastatic colon carcinoma with lung and chest wall metastasis status post 8 cycles of FOLFOX Avastin now on observation.  #2 DCIS of the left rest status post lumpectomy followed by radiation and now on Aromasin and tolerating it well.  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   PLAN:   #1 recommend restaging scans with CT of the chest abdomen and pelvis.  #2 return to see me in about 2-3 months time.  #3 continue Aromasin.   All questions were answered. The patient knows to call the clinic with any problems, questions or concerns. We can certainly see the patient much sooner if necessary.  I spent 25 minutes counseling the patient face to face.  The total time spent in the appointment was 30 minutes.    Drue Second, MD Medical/Oncology [facility] [phone] (beeper) [phone] (Office)  01/17/2012, 6:09 PM

## 2012-01-20 ENCOUNTER — Telehealth: Payer: Self-pay | Admitting: *Deleted

## 2012-01-20 NOTE — Telephone Encounter (Signed)
Notified pt per MD Tumor Marker CEA normal. Pt verbalized understanding.

## 2012-01-20 NOTE — Telephone Encounter (Signed)
Message copied by Cooper Render on Thu Jan 20, 2012  2:25 PM ------      Message from: Victorino December      Created: Fri Jan 14, 2012  4:05 PM       Call patient: tumor mrker CEA normal

## 2012-01-27 ENCOUNTER — Other Ambulatory Visit: Payer: Self-pay | Admitting: Family Medicine

## 2012-01-27 ENCOUNTER — Ambulatory Visit (INDEPENDENT_AMBULATORY_CARE_PROVIDER_SITE_OTHER): Payer: Medicare Other | Admitting: Family Medicine

## 2012-01-27 DIAGNOSIS — Z5181 Encounter for therapeutic drug level monitoring: Secondary | ICD-10-CM

## 2012-01-27 DIAGNOSIS — Z7901 Long term (current) use of anticoagulants: Secondary | ICD-10-CM | POA: Diagnosis not present

## 2012-01-27 DIAGNOSIS — I82409 Acute embolism and thrombosis of unspecified deep veins of unspecified lower extremity: Secondary | ICD-10-CM

## 2012-01-27 NOTE — Patient Instructions (Signed)
Continue current dose, check in 4 weeks  

## 2012-02-01 ENCOUNTER — Encounter (INDEPENDENT_AMBULATORY_CARE_PROVIDER_SITE_OTHER): Payer: Self-pay | Admitting: General Surgery

## 2012-02-01 ENCOUNTER — Ambulatory Visit (INDEPENDENT_AMBULATORY_CARE_PROVIDER_SITE_OTHER): Payer: Medicare Other | Admitting: General Surgery

## 2012-02-01 VITALS — BP 96/64 | HR 66 | Temp 97.8°F | Resp 18 | Ht 66.0 in | Wt 185.8 lb

## 2012-02-01 DIAGNOSIS — D0510 Intraductal carcinoma in situ of unspecified breast: Secondary | ICD-10-CM

## 2012-02-01 DIAGNOSIS — D0590 Unspecified type of carcinoma in situ of unspecified breast: Secondary | ICD-10-CM

## 2012-02-01 NOTE — Patient Instructions (Signed)
Continue regular self exams Continue Aromasin

## 2012-02-07 ENCOUNTER — Ambulatory Visit (HOSPITAL_BASED_OUTPATIENT_CLINIC_OR_DEPARTMENT_OTHER): Payer: Medicare Other

## 2012-02-07 DIAGNOSIS — C50919 Malignant neoplasm of unspecified site of unspecified female breast: Secondary | ICD-10-CM | POA: Diagnosis not present

## 2012-02-07 MED ORDER — SODIUM CHLORIDE 0.9 % IJ SOLN
10.0000 mL | INTRAMUSCULAR | Status: DC | PRN
  Administered 2012-02-07: 10 mL via INTRAVENOUS
  Filled 2012-02-07: qty 10

## 2012-02-07 MED ORDER — HEPARIN SOD (PORK) LOCK FLUSH 100 UNIT/ML IV SOLN
500.0000 [IU] | Freq: Once | INTRAVENOUS | Status: AC
  Administered 2012-02-07: 500 [IU] via INTRAVENOUS
  Filled 2012-02-07: qty 5

## 2012-02-08 ENCOUNTER — Encounter (INDEPENDENT_AMBULATORY_CARE_PROVIDER_SITE_OTHER): Payer: Self-pay | Admitting: General Surgery

## 2012-02-08 NOTE — Progress Notes (Signed)
Subjective:     Patient ID: Susan Duffy, female   DOB: August 19, 1946, 66 y.o.   MRN: [id number]  HPI The patient is a 66 year old white female who is 3 years out from a left breast lumpectomy for DCIS. She continues to take Aromasin and is tolerating this well. Unfortunately she was also found to have metastatic colon cancer. She stopped her chemotherapy for the colon cancer a few months ago because she was unable to tolerate it anymore. She seems to be doing reasonably well now and has no complaints.  Review of Systems  Constitutional: Negative.   HENT: Negative.   Eyes: Negative.   Respiratory: Negative.   Cardiovascular: Negative.   Gastrointestinal: Negative.   Genitourinary: Negative.   Musculoskeletal: Negative.   Skin: Negative.   Neurological: Negative.   Hematological: Negative.   Psychiatric/Behavioral: Negative.        Objective:   Physical Exam  Constitutional: She is oriented to person, place, and time. She appears well-developed and well-nourished.  HENT:  Head: Normocephalic and atraumatic.  Eyes: Conjunctivae and EOM are normal. Pupils are equal, round, and reactive to light.  Neck: Normal range of motion. Neck supple.  Cardiovascular: Normal rate, regular rhythm and normal heart sounds.   Pulmonary/Chest: Effort normal and breath sounds normal.       No palpable mass in either breast. No axillary supraclavicular or cervical lymphadenopathy  Abdominal: Soft. Bowel sounds are normal. She exhibits no mass. There is no tenderness.  Musculoskeletal: Normal range of motion.  Lymphadenopathy:    She has no cervical adenopathy.  Neurological: She is alert and oriented to person, place, and time.  Skin: Skin is warm and dry.  Psychiatric: She has a normal mood and affect. Her behavior is normal.       Assessment:     3 years status post left breast lumpectomy for DCIS now with metastatic colon cancer    Plan:     She will continue Aromasin and regular self  exams. We will plan to see her back in another 6 months.

## 2012-02-20 ENCOUNTER — Other Ambulatory Visit: Payer: Self-pay | Admitting: Oncology

## 2012-02-24 ENCOUNTER — Ambulatory Visit (INDEPENDENT_AMBULATORY_CARE_PROVIDER_SITE_OTHER): Payer: Medicare Other | Admitting: Family Medicine

## 2012-02-24 DIAGNOSIS — I82409 Acute embolism and thrombosis of unspecified deep veins of unspecified lower extremity: Secondary | ICD-10-CM | POA: Diagnosis not present

## 2012-02-24 DIAGNOSIS — Z5181 Encounter for therapeutic drug level monitoring: Secondary | ICD-10-CM | POA: Diagnosis not present

## 2012-02-24 DIAGNOSIS — Z7901 Long term (current) use of anticoagulants: Secondary | ICD-10-CM | POA: Diagnosis not present

## 2012-02-24 NOTE — Patient Instructions (Signed)
Continue current dose, check in 4 weeks  

## 2012-03-10 ENCOUNTER — Other Ambulatory Visit (HOSPITAL_BASED_OUTPATIENT_CLINIC_OR_DEPARTMENT_OTHER): Payer: Medicare Other | Admitting: Lab

## 2012-03-10 DIAGNOSIS — C189 Malignant neoplasm of colon, unspecified: Secondary | ICD-10-CM | POA: Diagnosis not present

## 2012-03-10 LAB — COMPREHENSIVE METABOLIC PANEL
ALT: 29 U/L (ref 0–35)
CO2: 26 mEq/L (ref 19–32)
Calcium: 9.4 mg/dL (ref 8.4–10.5)
Chloride: 101 mEq/L (ref 96–112)
Creatinine, Ser: 1.02 mg/dL (ref 0.50–1.10)
Glucose, Bld: 104 mg/dL — ABNORMAL HIGH (ref 70–99)
Sodium: 136 mEq/L (ref 135–145)
Total Bilirubin: 0.4 mg/dL (ref 0.3–1.2)
Total Protein: 7.7 g/dL (ref 6.0–8.3)

## 2012-03-10 LAB — CBC WITH DIFFERENTIAL/PLATELET
Basophils Absolute: 0 10*3/uL (ref 0.0–0.1)
Eosinophils Absolute: 0.1 10*3/uL (ref 0.0–0.5)
HGB: 13.1 g/dL (ref 11.6–15.9)
LYMPH%: 12.3 % — ABNORMAL LOW (ref 14.0–49.7)
MCV: 96.5 fL (ref 79.5–101.0)
MONO#: 0.8 10*3/uL (ref 0.1–0.9)
MONO%: 11.4 % (ref 0.0–14.0)
NEUT#: 5 10*3/uL (ref 1.5–6.5)
Platelets: 230 10*3/uL (ref 145–400)
RBC: 3.99 10*6/uL (ref 3.70–5.45)
WBC: 6.8 10*3/uL (ref 3.9–10.3)
nRBC: 0 % (ref 0–0)

## 2012-03-10 LAB — CEA: CEA: 1 ng/mL (ref 0.0–5.0)

## 2012-03-13 ENCOUNTER — Ambulatory Visit (HOSPITAL_COMMUNITY)
Admission: RE | Admit: 2012-03-13 | Discharge: 2012-03-13 | Disposition: A | Discharge status: Home / Self Care | Payer: Medicare Other | Source: Ambulatory Visit | Attending: Oncology | Admitting: Oncology

## 2012-03-13 ENCOUNTER — Other Ambulatory Visit: Payer: Medicare Other | Admitting: Lab

## 2012-03-13 ENCOUNTER — Other Ambulatory Visit: Payer: Medicare Other

## 2012-03-13 DIAGNOSIS — Z923 Personal history of irradiation: Secondary | ICD-10-CM | POA: Diagnosis not present

## 2012-03-13 DIAGNOSIS — N281 Cyst of kidney, acquired: Secondary | ICD-10-CM | POA: Diagnosis not present

## 2012-03-13 DIAGNOSIS — C189 Malignant neoplasm of colon, unspecified: Secondary | ICD-10-CM | POA: Diagnosis not present

## 2012-03-13 DIAGNOSIS — C50919 Malignant neoplasm of unspecified site of unspecified female breast: Secondary | ICD-10-CM | POA: Diagnosis not present

## 2012-03-13 DIAGNOSIS — R229 Localized swelling, mass and lump, unspecified: Secondary | ICD-10-CM | POA: Insufficient documentation

## 2012-03-13 DIAGNOSIS — Z853 Personal history of malignant neoplasm of breast: Secondary | ICD-10-CM | POA: Diagnosis not present

## 2012-03-13 DIAGNOSIS — Z85038 Personal history of other malignant neoplasm of large intestine: Secondary | ICD-10-CM | POA: Diagnosis not present

## 2012-03-13 DIAGNOSIS — C779 Secondary and unspecified malignant neoplasm of lymph node, unspecified: Secondary | ICD-10-CM | POA: Insufficient documentation

## 2012-03-13 MED ORDER — IOHEXOL 300 MG/ML  SOLN
100.0000 mL | Freq: Once | INTRAMUSCULAR | Status: AC | PRN
  Administered 2012-03-13: 100 mL via INTRAVENOUS

## 2012-03-20 ENCOUNTER — Ambulatory Visit: Payer: Medicare Other | Admitting: Family Medicine

## 2012-03-28 ENCOUNTER — Other Ambulatory Visit: Payer: Medicare Other | Admitting: Lab

## 2012-03-28 ENCOUNTER — Encounter: Payer: Self-pay | Admitting: Oncology

## 2012-03-28 ENCOUNTER — Ambulatory Visit (HOSPITAL_BASED_OUTPATIENT_CLINIC_OR_DEPARTMENT_OTHER): Payer: Medicare Other | Admitting: Oncology

## 2012-03-28 ENCOUNTER — Telehealth: Payer: Self-pay | Admitting: Oncology

## 2012-03-28 VITALS — BP 118/75 | HR 82 | Temp 98.5°F | Ht 66.0 in | Wt 187.4 lb

## 2012-03-28 DIAGNOSIS — C50919 Malignant neoplasm of unspecified site of unspecified female breast: Secondary | ICD-10-CM

## 2012-03-28 DIAGNOSIS — D0590 Unspecified type of carcinoma in situ of unspecified breast: Secondary | ICD-10-CM

## 2012-03-28 DIAGNOSIS — I82409 Acute embolism and thrombosis of unspecified deep veins of unspecified lower extremity: Secondary | ICD-10-CM

## 2012-03-28 DIAGNOSIS — C779 Secondary and unspecified malignant neoplasm of lymph node, unspecified: Secondary | ICD-10-CM | POA: Diagnosis not present

## 2012-03-28 DIAGNOSIS — C7800 Secondary malignant neoplasm of unspecified lung: Secondary | ICD-10-CM | POA: Diagnosis not present

## 2012-03-28 DIAGNOSIS — C189 Malignant neoplasm of colon, unspecified: Secondary | ICD-10-CM | POA: Diagnosis not present

## 2012-03-28 DIAGNOSIS — D0510 Intraductal carcinoma in situ of unspecified breast: Secondary | ICD-10-CM

## 2012-03-28 NOTE — Patient Instructions (Signed)
1. Your scans showed no evidence of recurrent cancer  2. You will continue the aromasin daily  3. Have your INR checked at your PCP office.  4. Restaging scans in 3 months and then follow up with me

## 2012-03-28 NOTE — Progress Notes (Signed)
OFFICE PROGRESS NOTE  CC  [name], MD, MD [address] 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 Thus far she has received 8 cycles of FOLFOX Avastin. Most recently we discontinued it due to side effects and she has been on observation only.  #2 patient also has DCIS of the left breast she underwent a lumpectomy followed by radiation and now is on Aromasin 25 mg daily.  #3 patient previously was on tamoxifen and unfortunate she developed a deep venous thrombosis. She is now on Coumadin to titrate her INR between 2 and 2.5. This is being managed by [place] or health at [facility].  CURRENT THERAPY:Aromasin 25 mg daily and observation for colon cancer  INTERVAL HISTORY: Susan Duffy 66 y.o. female returns for Followup visit today.Clinically she is doing well. She did experience some pain in the left flank region but it dissipated very quickly. Her INRs I have reviewed and they have all been therapeutic. Today she is without any pain she denies any headaches double vision blurring of vision nausea vomiting fevers chills abdominal pain no peripheral paresthesias no hematuria hematochezia melena she has no breast tenderness. Remainder of the 10 point review of systems is negative.  MEDICAL HISTORY: Past Medical History  Diagnosis Date  . Anemia     iron defiency  . Arthritis     osteoarthritis  . Allergy   . TIA (transient ischemic attack) 03/08/2011  . Hypertension   . Cancer 12/25/08    Left DCIS,ERPR+,Metastatic colon     ALLERGIES:  is allergic to amoxicillin and  erythromycin.  MEDICATIONS:  Current Outpatient Prescriptions  Medication Sig Dispense Refill  . acetaminophen (TYLENOL) 500 MG tablet as needed. Take 1-2 by mouth every 6 hours as needed       . aspirin 81 MG tablet Take 81 mg by mouth daily.        . Cyanocobalamin (VITAMIN B-12) 2000 MCG TBCR Take 1 tablet by mouth daily.        Marland Kitchen exemestane (AROMASIN) 25 MG tablet TAKE 1 TABLET DAILY AFTER A MEAL.  90 tablet  0  . irbesartan (AVAPRO) 150 MG tablet TAKE 1 TABLET BY MOUTH EVERY DAY  90 tablet  1  . simvastatin (ZOCOR) 20 MG tablet TAKE 1 TABLET (20 MG TOTAL) BY MOUTH AT BEDTIME.  90 tablet  2  . warfarin (COUMADIN) 2.5 MG tablet Take 2.5 mg by mouth. As directed per Heme Onc office. Patient takes 2.5 mg four days per week, and the 5 mg dosage the other 3 days. Per patient as of 02/01/12. System would not allow notation to be made on 5 mg due to prescription attached.      . warfarin (COUMADIN) 5 MG tablet USE AS DIRECTED BY COUMADIN CLINIC  30 tablet  3    SURGICAL HISTORY:  Past Surgical History  Procedure Date  . Colon surgery     colectomy, partial  . Breast biopsy 1-10    fibroma removal, s/p lumpectomy and radiation   . Lobectomy 06-2009    metastatic colon cancer  . Breast lumpectomy  2010    left breast    REVIEW OF SYSTEMS:  Pertinent items are noted in HPI.   PHYSICAL EXAMINATION: General appearance: alert, cooperative and appears stated age Neck: no adenopathy, no carotid bruit, no JVD, supple, symmetrical, trachea midline and thyroid not enlarged, symmetric, no tenderness/mass/nodules Lymph nodes: Cervical, supraclavicular, and axillary nodes normal. Resp: clear to auscultation bilaterally and normal percussion bilaterally Back: symmetric, no curvature. ROM normal. No CVA tenderness. Abdomen: Soft nontender nondistended bowel sounds are present no other splenomegaly. Extremities: No edema clubbing or cyanosis. Neuro: Alert oriented strength is symmetrical in upper and  lower extremities otherwise nonfocal.  ECOG PERFORMANCE STATUS: 0 - Asymptomatic  Blood pressure 118/75, pulse 82, temperature 98.5 F (36.9 C), temperature source Oral, height 5\' 6"  (1.676 m), weight 187 lb 6.4 oz (85.004 kg).  LABORATORY DATA: Lab Results  Component Value Date   WBC 6.8 03/10/2012   HGB 13.1 03/10/2012   HCT 38.5 03/10/2012   MCV 96.5 03/10/2012   PLT 230 03/10/2012      Chemistry      Component Value Date/Time   NA 136 03/10/2012 1305   NA 142 10/02/2010 1402   K 4.2 03/10/2012 1305   K 4.1 10/02/2010 1402   CL 101 03/10/2012 1305   CL 102 10/02/2010 1402   CO2 26 03/10/2012 1305   CO2 29 10/02/2010 1402   BUN 32* 03/10/2012 1305   BUN 22 10/02/2010 1402   CREATININE 1.02 03/10/2012 1305   CREATININE 0.8 10/02/2010 1402      Component Value Date/Time   CALCIUM 9.4 03/10/2012 1305   CALCIUM 9.8 10/02/2010 1402   ALKPHOS 132* 03/10/2012 1305   ALKPHOS 117* 10/02/2010 1402   AST 32 03/10/2012 1305   AST 32 10/02/2010 1402   ALT 29 03/10/2012 1305   BILITOT 0.4 03/10/2012 1305   BILITOT 0.60 10/02/2010 1402       RADIOGRAPHIC STUDIES:  No results found.  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 She is noted to have a abdominal subcutaneous nodule near the umbilicus unclear etiology we will continue to monitor this.  #2 DCIS of the left rest status post lumpectomy followed by radiation and now on Aromasin and tolerating it well.  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   PLAN:  #1 patient will be seen back in 3 month's time with restaging scans.  #2 she will continue to be seen at her primary care physician's office for monitoring of Coumadin levels.  #3 she will continue Aromasin for DCIS.  #4 patient knows to call  me with any problems questions or concerns.  All questions were answered. The patient knows to call the clinic with any problems, questions or concerns. We can certainly see the patient much sooner if necessary.  I spent 25 minutes counseling the patient face to face. The total time spent in the appointment was 30 minutes.    Drue Second, MD Medical/Oncology [facility] [phone] (beeper) [phone] (Office)  03/28/2012, 10:11 AM

## 2012-03-28 NOTE — Telephone Encounter (Signed)
gve the pt her may-dec 2013 appt calendar along with the ct scan appt with instructions.

## 2012-04-03 ENCOUNTER — Other Ambulatory Visit (INDEPENDENT_AMBULATORY_CARE_PROVIDER_SITE_OTHER): Payer: Medicare Other

## 2012-04-03 ENCOUNTER — Ambulatory Visit (INDEPENDENT_AMBULATORY_CARE_PROVIDER_SITE_OTHER): Payer: Medicare Other | Admitting: Family Medicine

## 2012-04-03 DIAGNOSIS — I82409 Acute embolism and thrombosis of unspecified deep veins of unspecified lower extremity: Secondary | ICD-10-CM | POA: Diagnosis not present

## 2012-04-03 DIAGNOSIS — IMO0001 Reserved for inherently not codable concepts without codable children: Secondary | ICD-10-CM | POA: Diagnosis not present

## 2012-04-03 DIAGNOSIS — R7989 Other specified abnormal findings of blood chemistry: Secondary | ICD-10-CM | POA: Diagnosis not present

## 2012-04-03 DIAGNOSIS — I10 Essential (primary) hypertension: Secondary | ICD-10-CM | POA: Diagnosis not present

## 2012-04-03 DIAGNOSIS — Z7901 Long term (current) use of anticoagulants: Secondary | ICD-10-CM

## 2012-04-03 DIAGNOSIS — E119 Type 2 diabetes mellitus without complications: Secondary | ICD-10-CM

## 2012-04-03 DIAGNOSIS — E7800 Pure hypercholesterolemia, unspecified: Secondary | ICD-10-CM

## 2012-04-03 LAB — COMPREHENSIVE METABOLIC PANEL
AST: 37 U/L (ref 0–37)
Albumin: 3.9 g/dL (ref 3.5–5.2)
Alkaline Phosphatase: 121 U/L — ABNORMAL HIGH (ref 39–117)
BUN: 23 mg/dL (ref 6–23)
Calcium: 9.4 mg/dL (ref 8.4–10.5)
Chloride: 102 mEq/L (ref 96–112)
Glucose, Bld: 100 mg/dL — ABNORMAL HIGH (ref 70–99)
Potassium: 3.7 mEq/L (ref 3.5–5.1)
Sodium: 138 mEq/L (ref 135–145)
Total Protein: 8 g/dL (ref 6.0–8.3)

## 2012-04-03 LAB — POCT INR: INR: 3.9

## 2012-04-03 LAB — LIPID PANEL
Cholesterol: 130 mg/dL (ref 0–200)
LDL Cholesterol: 70 mg/dL (ref 0–99)
VLDL: 23.4 mg/dL (ref 0.0–40.0)

## 2012-04-03 LAB — HEMOGLOBIN A1C: Hgb A1c MFr Bld: 5.7 % (ref 4.6–6.5)

## 2012-04-03 NOTE — Patient Instructions (Addendum)
  Hold today then resume , 2.5 mg daily ,5 mg T,TH,SAT check in 1 week

## 2012-04-10 ENCOUNTER — Ambulatory Visit: Payer: Medicare Other | Admitting: Family Medicine

## 2012-04-11 ENCOUNTER — Ambulatory Visit (INDEPENDENT_AMBULATORY_CARE_PROVIDER_SITE_OTHER): Payer: Medicare Other | Admitting: Family Medicine

## 2012-04-11 ENCOUNTER — Encounter: Payer: Self-pay | Admitting: Family Medicine

## 2012-04-11 VITALS — BP 90/60 | HR 80 | Temp 98.3°F | Ht 65.5 in | Wt 185.4 lb

## 2012-04-11 DIAGNOSIS — I10 Essential (primary) hypertension: Secondary | ICD-10-CM | POA: Diagnosis not present

## 2012-04-11 DIAGNOSIS — R0602 Shortness of breath: Secondary | ICD-10-CM | POA: Diagnosis not present

## 2012-04-11 DIAGNOSIS — E119 Type 2 diabetes mellitus without complications: Secondary | ICD-10-CM

## 2012-04-11 DIAGNOSIS — E7800 Pure hypercholesterolemia, unspecified: Secondary | ICD-10-CM | POA: Diagnosis not present

## 2012-04-11 DIAGNOSIS — I82409 Acute embolism and thrombosis of unspecified deep veins of unspecified lower extremity: Secondary | ICD-10-CM

## 2012-04-11 LAB — HM DIABETES FOOT EXAM

## 2012-04-11 NOTE — Assessment & Plan Note (Signed)
Stable, nml CXR/ CT... Likely due to past lung surgery.

## 2012-04-11 NOTE — Patient Instructions (Signed)
2.5 mg daily ,5 mg T,TH. Check in 2 weeks (reducded dose by 2.5 mg )

## 2012-04-11 NOTE — Assessment & Plan Note (Signed)
Well controlled 

## 2012-04-11 NOTE — Assessment & Plan Note (Signed)
Improved and now ay goal LDL <70. Triglycerides also at goal.

## 2012-04-11 NOTE — Assessment & Plan Note (Signed)
Well controlled. Continue current medication.  

## 2012-04-11 NOTE — Patient Instructions (Addendum)
Call if dizziness with blood pressure levels regularly. Continue working on exercise and healthy diet.

## 2012-04-11 NOTE — Progress Notes (Signed)
  Subjective:    Patient ID: Susan Duffy, female    DOB: 04-Mar-1946, 66 y.o.   MRN: [id number]  HPI 66 year old female with metastatic colon carcinoma with lung and chest wall metastasis, hx of breast cancer, HTN, DVT ( on coumadin) and DM  Presents for 6 month follow up.  Cancer followed closely by Dr. Welton Flakes Had chest, abd, pelvis stable except small nodule abdominal soft tissue.  Diabetes: well controlled on no medicaiton Lab Results  Component Value Date   HGBA1C 5.7 04/03/2012  Using medications without difficulties: Hypoglycemic episodes:? Hyperglycemic episodes:? 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 Chest pain with exertion: Edema:None Short of breath:Stable.Marland Kitchen No changes Average home BPs: Running lower recently at home. Other issues:         Review of Systems Flank pain and back pain resolved in few days after seen in 12/2011   Objective:   Physical Exam  Constitutional: Vital signs are normal. She appears well-developed and well-nourished. She is cooperative.  Non-toxic appearance. She does not appear ill. No distress.       Losing hair  HENT:  Head: Normocephalic.  Right Ear: Hearing, tympanic membrane, external ear and ear canal normal. Tympanic membrane is not erythematous, not retracted and not bulging.  Left Ear: Hearing, tympanic membrane, external ear and ear canal normal. Tympanic membrane is not erythematous, not retracted and not bulging.  Nose: No mucosal edema or rhinorrhea. Right sinus exhibits no maxillary sinus tenderness and no frontal sinus tenderness.  Left sinus exhibits no maxillary sinus tenderness and no frontal sinus tenderness.  Mouth/Throat: Uvula is midline, oropharynx is clear and moist and mucous membranes are normal.  Eyes: Conjunctivae, EOM and lids are normal. Pupils are equal, round, and reactive to light. No foreign bodies found.  Neck: Trachea normal and normal range of motion. Neck supple. Carotid bruit is not present. No mass and no thyromegaly present.  Cardiovascular: Normal rate, regular rhythm, S1 normal, S2 normal, normal heart sounds, intact distal pulses and normal pulses.  Exam reveals no gallop and no friction rub.   No murmur heard. Pulmonary/Chest: Effort normal and breath sounds normal. Not tachypneic. No respiratory distress. She has no decreased breath sounds. She has no wheezes. She has no rhonchi. She has no rales.  Abdominal: Soft. Normal appearance and bowel sounds are normal. There is no tenderness.  Neurological: She is alert.  Skin: Skin is warm, dry and intact. No rash noted.  Psychiatric: Her speech is normal and behavior is normal. Judgment and thought content normal. Her mood appears not anxious. Cognition and memory are normal. She does not exhibit a depressed mood.    Diabetic foot exam: Normal inspection No skin breakdown Mild calluses, hammer toes, bunions B Normal DP pulses Mildly diminished sensation to light touch and monofilament Nails normal       Assessment & Plan:

## 2012-04-25 ENCOUNTER — Ambulatory Visit (INDEPENDENT_AMBULATORY_CARE_PROVIDER_SITE_OTHER): Payer: Medicare Other | Admitting: Family Medicine

## 2012-04-25 DIAGNOSIS — I82409 Acute embolism and thrombosis of unspecified deep veins of unspecified lower extremity: Secondary | ICD-10-CM

## 2012-04-25 NOTE — Patient Instructions (Signed)
2.5 mg daily ,5 mg T,TH, sat. Check in 2 weeks ( increased dose by 2.5 mg )

## 2012-05-02 ENCOUNTER — Ambulatory Visit (HOSPITAL_BASED_OUTPATIENT_CLINIC_OR_DEPARTMENT_OTHER): Payer: Medicare Other

## 2012-05-02 ENCOUNTER — Telehealth: Payer: Self-pay | Admitting: Oncology

## 2012-05-02 VITALS — BP 108/65 | HR 100 | Temp 98.0°F

## 2012-05-02 DIAGNOSIS — C50919 Malignant neoplasm of unspecified site of unspecified female breast: Secondary | ICD-10-CM

## 2012-05-02 DIAGNOSIS — Z452 Encounter for adjustment and management of vascular access device: Secondary | ICD-10-CM | POA: Diagnosis not present

## 2012-05-02 MED ORDER — HEPARIN SOD (PORK) LOCK FLUSH 100 UNIT/ML IV SOLN
500.0000 [IU] | Freq: Once | INTRAVENOUS | Status: AC
  Administered 2012-05-02: 500 [IU] via INTRAVENOUS
  Filled 2012-05-02: qty 5

## 2012-05-02 MED ORDER — SODIUM CHLORIDE 0.9 % IJ SOLN
10.0000 mL | INTRAMUSCULAR | Status: DC | PRN
  Administered 2012-05-02: 10 mL via INTRAVENOUS
  Filled 2012-05-02: qty 10

## 2012-05-02 NOTE — Telephone Encounter (Signed)
gve the pt her June,oct 2013 appt calendar

## 2012-05-04 ENCOUNTER — Ambulatory Visit: Payer: Medicare Other | Admitting: Family Medicine

## 2012-05-04 ENCOUNTER — Telehealth: Payer: Self-pay | Admitting: Family Medicine

## 2012-05-04 DIAGNOSIS — Z0289 Encounter for other administrative examinations: Secondary | ICD-10-CM

## 2012-05-04 NOTE — Telephone Encounter (Signed)
  Per [name], RN  Caller: Susan Duffy/Patient; PCP: Excell Seltzer.; CB#: [phone]; ; ; Call regarding Fever;  Onset- 04/29/12 Temp- 103  Pt has had fever since Sat. Emergent s/s of Fever protocol r/o. Pt to see provider within 4 hrs. Appts booked for today. CSR aware.  Pt notified she will get a call concerning further instructions.

## 2012-05-04 NOTE — Telephone Encounter (Signed)
Please call and check on this patient -- missed appt with me this AM but fever to 103? Will need evaluation definitely

## 2012-05-04 NOTE — Telephone Encounter (Signed)
Spoke with patient and she stated that she didn't know she had an appt today.  Scheduled patient to see Dr. Sharen Hones tomorrow morning at 10:15.

## 2012-05-05 ENCOUNTER — Other Ambulatory Visit: Payer: Self-pay | Admitting: Family Medicine

## 2012-05-05 ENCOUNTER — Encounter: Payer: Self-pay | Admitting: Family Medicine

## 2012-05-05 ENCOUNTER — Ambulatory Visit (INDEPENDENT_AMBULATORY_CARE_PROVIDER_SITE_OTHER): Payer: Medicare Other | Admitting: Family Medicine

## 2012-05-05 VITALS — BP 112/74 | HR 90 | Temp 97.8°F | Wt 184.2 lb

## 2012-05-05 DIAGNOSIS — IMO0001 Reserved for inherently not codable concepts without codable children: Secondary | ICD-10-CM | POA: Diagnosis not present

## 2012-05-05 DIAGNOSIS — R509 Fever, unspecified: Secondary | ICD-10-CM

## 2012-05-05 DIAGNOSIS — M791 Myalgia, unspecified site: Secondary | ICD-10-CM

## 2012-05-05 DIAGNOSIS — N179 Acute kidney failure, unspecified: Secondary | ICD-10-CM

## 2012-05-05 LAB — BASIC METABOLIC PANEL
CO2: 28 mEq/L (ref 19–32)
Calcium: 9.3 mg/dL (ref 8.4–10.5)
Creatinine, Ser: 1.4 mg/dL — ABNORMAL HIGH (ref 0.4–1.2)
GFR: 39.41 mL/min — ABNORMAL LOW (ref >60.00)
Sodium: 138 mEq/L (ref 135–145)

## 2012-05-05 LAB — CBC WITH DIFFERENTIAL/PLATELET
Basophils Absolute: 0 10*3/uL (ref 0.0–0.1)
Eosinophils Absolute: 0 10*3/uL (ref 0.0–0.7)
HCT: 36.8 % (ref 36.0–46.0)
Hemoglobin: 12.5 g/dL (ref 12.0–15.0)
Lymphs Abs: 1.2 10*3/uL (ref 0.7–4.0)
MCHC: 33.9 g/dL (ref 30.0–36.0)
MCV: 97 fl (ref 78.0–100.0)
Monocytes Absolute: 0.9 10*3/uL (ref 0.1–1.0)
Neutro Abs: 7.7 10*3/uL (ref 1.4–7.7)
Platelets: 199 10*3/uL (ref 150.0–400.0)
RDW: 13.9 % (ref 11.5–14.6)

## 2012-05-05 LAB — CK: Total CK: 51 U/L (ref 7–177)

## 2012-05-05 NOTE — Patient Instructions (Signed)
Blood work today to check on things. Hold simvastatin until body aches better. If fever continued into next week with changing symptoms please seek urgent care or come in to see [place] again. If just fever and body aches continue (without changing symptoms), please let us or oncology know. Good to see you today, I hope you start feeling better.

## 2012-05-05 NOTE — Progress Notes (Signed)
Subjective:    Patient ID: Susan Duffy, female    DOB: 11-28-1946, 66 y.o.   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  As far as metastatic colon cancer, currently not pursuing active treatment.  Stopped chemo 6 mo ago, to re evaluate in July.  Sees Dr. Welton Flakes.  Currently only on aromasin for h/o breast cancer (for another 2 yrs).  About 1 1/2 wks ago, did have insect bite on lower left leg while walking dog in park.  Then 7d ago started having chills and fevers associated with dull constant frontal headache and body aches (myalgias, arthralgias).  This has continued - during day feels fine, then at dinner time fever spiking to 103 and over night high fevers and sweats.  Last night actually did a bit better.  Also associated with feeling fatigued "wiped out".  Denies ear pain, tooth pain, ST, cough, chest congestion or sinus congestion, abd pain, nausea/vomiting, diarrhea, dysuria, urgency, frequency, chest pain or new shortness of breath, skin infection.  Denies tick bites.  Prior to this starting, she did go to [employer] kindergarten graduation and was exposed to lots of small children.   Wt Readings from Last 3 Encounters:  05/05/12 184 lb 4 oz (83.575 kg)  04/11/12 185 lb 6.4 oz (84.097 kg)  03/28/12 187 lb 6.4 oz (85.004 kg)   Lab Results  Component Value Date   INR 2.1 04/25/2012   INR 3.2 04/11/2012   INR 3.9 04/03/2012   PROTIME 21.6* 11/12/2010   PROTIME 42.0* 11/04/2010   PROTIME 18.0* 10/26/2010   Past Medical History  Diagnosis Date  . Anemia     iron defiency  . Arthritis     osteoarthritis  . Allergy   . TIA (transient ischemic attack) 03/08/2011  . Hypertension   . Cancer 12/25/08    Left DCIS,ERPR+,Metastatic colon      Review of Systems Per HPI    Objective:   Physical Exam  Nursing note and vitals reviewed. Constitutional:  She is oriented to person, place, and time. She appears well-developed and well-nourished. No distress.  HENT:  Head: Normocephalic and atraumatic.  Right Ear: Hearing, tympanic membrane, external ear and ear canal normal.  Left Ear: Hearing, tympanic membrane, external ear and ear canal normal.  Nose: No mucosal edema or rhinorrhea. Right sinus exhibits no maxillary sinus tenderness and no frontal sinus tenderness. Left sinus exhibits no maxillary sinus tenderness and no frontal sinus tenderness.  Mouth/Throat: Uvula is midline, oropharynx is clear and moist and mucous membranes are normal. No oropharyngeal exudate, posterior oropharyngeal edema, posterior oropharyngeal erythema or tonsillar abscesses.  Eyes: Conjunctivae and EOM are normal. Pupils are equal, round, and reactive to light. No scleral icterus.  Neck: Normal range of motion. Neck supple.  Cardiovascular: Normal rate, normal heart sounds and intact distal pulses.   No murmur heard. Pulmonary/Chest: Effort normal and breath sounds normal. No respiratory distress. She has no wheezes. She has no rales.  Abdominal: Soft. Bowel sounds are normal. She exhibits no distension and no mass. There is no tenderness. There is no rebound and no guarding.  Musculoskeletal: She exhibits no edema.  Lymphadenopathy:    She has no cervical adenopathy.  Neurological: She is alert and oriented to person, place, and time. No cranial nerve deficit. Coordination normal.  Skin: Skin is warm and dry. No rash noted.  Left calf with old insect bite, slight induration  Psychiatric: She has a normal mood and affect.       Assessment & Plan:

## 2012-05-05 NOTE — Assessment & Plan Note (Addendum)
Of 7 d durtion associated with headache and arthralgia/myalgia and malaise.   Today actually feeling some better. Non toxic exam, nonfocal neurological exam. Unclear where this is coming from, infection vs inflammation vs other. Start with blood work - CBC, CPK, and BMP.  Will call her with results. If any worsening over weekend, to seek urgent care, otherwise if continued into next week, return here for further eval (consider CXR, UA) vs to onc. ?viral syndrome but atypical prolonged duration of sxs.

## 2012-05-09 ENCOUNTER — Ambulatory Visit (INDEPENDENT_AMBULATORY_CARE_PROVIDER_SITE_OTHER): Payer: Medicare Other | Admitting: Family Medicine

## 2012-05-09 DIAGNOSIS — I82409 Acute embolism and thrombosis of unspecified deep veins of unspecified lower extremity: Secondary | ICD-10-CM | POA: Diagnosis not present

## 2012-05-09 LAB — POCT INR: INR: 3.4

## 2012-05-09 NOTE — Patient Instructions (Addendum)
   2.5 mg daily ,5 mg T,TH, sat. Check in 2 weeks

## 2012-05-12 ENCOUNTER — Other Ambulatory Visit (INDEPENDENT_AMBULATORY_CARE_PROVIDER_SITE_OTHER): Payer: Medicare Other

## 2012-05-12 ENCOUNTER — Other Ambulatory Visit: Payer: Medicare Other

## 2012-05-12 DIAGNOSIS — N179 Acute kidney failure, unspecified: Secondary | ICD-10-CM

## 2012-05-12 LAB — RENAL FUNCTION PANEL
Calcium: 9.1 mg/dL (ref 8.4–10.5)
Creatinine, Ser: 0.9 mg/dL (ref 0.4–1.2)
Glucose, Bld: 124 mg/dL — ABNORMAL HIGH (ref 70–99)
Phosphorus: 2.4 mg/dL (ref 2.3–4.6)
Potassium: 3.7 mEq/L (ref 3.5–5.1)
Sodium: 139 mEq/L (ref 135–145)

## 2012-05-21 ENCOUNTER — Other Ambulatory Visit: Payer: Self-pay | Admitting: Family Medicine

## 2012-05-21 ENCOUNTER — Other Ambulatory Visit: Payer: Self-pay | Admitting: Oncology

## 2012-05-23 ENCOUNTER — Ambulatory Visit (INDEPENDENT_AMBULATORY_CARE_PROVIDER_SITE_OTHER): Payer: Medicare Other | Admitting: Family Medicine

## 2012-05-23 DIAGNOSIS — I82409 Acute embolism and thrombosis of unspecified deep veins of unspecified lower extremity: Secondary | ICD-10-CM

## 2012-05-23 LAB — POCT INR: INR: 3.1

## 2012-05-23 NOTE — Patient Instructions (Addendum)
Continue current dose, check in 4 weeks  

## 2012-06-20 ENCOUNTER — Ambulatory Visit (INDEPENDENT_AMBULATORY_CARE_PROVIDER_SITE_OTHER): Payer: Medicare Other | Admitting: Family Medicine

## 2012-06-20 DIAGNOSIS — I82409 Acute embolism and thrombosis of unspecified deep veins of unspecified lower extremity: Secondary | ICD-10-CM

## 2012-06-20 NOTE — Patient Instructions (Signed)
Continue current dose, check in 4 weeks  

## 2012-07-03 ENCOUNTER — Other Ambulatory Visit (HOSPITAL_BASED_OUTPATIENT_CLINIC_OR_DEPARTMENT_OTHER): Payer: Medicare Other | Admitting: Lab

## 2012-07-03 ENCOUNTER — Ambulatory Visit (HOSPITAL_COMMUNITY)
Admission: RE | Admit: 2012-07-03 | Discharge: 2012-07-03 | Disposition: A | Discharge status: Home / Self Care | Payer: Medicare Other | Source: Ambulatory Visit | Attending: Oncology | Admitting: Oncology

## 2012-07-03 DIAGNOSIS — Z9049 Acquired absence of other specified parts of digestive tract: Secondary | ICD-10-CM | POA: Insufficient documentation

## 2012-07-03 DIAGNOSIS — Z902 Acquired absence of lung [part of]: Secondary | ICD-10-CM | POA: Diagnosis not present

## 2012-07-03 DIAGNOSIS — C189 Malignant neoplasm of colon, unspecified: Secondary | ICD-10-CM

## 2012-07-03 DIAGNOSIS — Z85038 Personal history of other malignant neoplasm of large intestine: Secondary | ICD-10-CM | POA: Diagnosis not present

## 2012-07-03 DIAGNOSIS — Z923 Personal history of irradiation: Secondary | ICD-10-CM | POA: Diagnosis not present

## 2012-07-03 DIAGNOSIS — Z5181 Encounter for therapeutic drug level monitoring: Secondary | ICD-10-CM | POA: Diagnosis not present

## 2012-07-03 DIAGNOSIS — Z7901 Long term (current) use of anticoagulants: Secondary | ICD-10-CM

## 2012-07-03 DIAGNOSIS — L989 Disorder of the skin and subcutaneous tissue, unspecified: Secondary | ICD-10-CM | POA: Insufficient documentation

## 2012-07-03 DIAGNOSIS — N281 Cyst of kidney, acquired: Secondary | ICD-10-CM | POA: Diagnosis not present

## 2012-07-03 DIAGNOSIS — Z9221 Personal history of antineoplastic chemotherapy: Secondary | ICD-10-CM | POA: Diagnosis not present

## 2012-07-03 DIAGNOSIS — Z853 Personal history of malignant neoplasm of breast: Secondary | ICD-10-CM | POA: Insufficient documentation

## 2012-07-03 DIAGNOSIS — I82409 Acute embolism and thrombosis of unspecified deep veins of unspecified lower extremity: Secondary | ICD-10-CM

## 2012-07-03 LAB — CMP (CANCER CENTER ONLY)
Albumin: 3.8 g/dL (ref 3.3–5.5)
BUN, Bld: 23 mg/dL — ABNORMAL HIGH (ref 7–22)
Calcium: 9.8 mg/dL (ref 8.0–10.3)
Chloride: 98 mEq/L (ref 98–108)
Glucose, Bld: 112 mg/dL (ref 73–118)
Potassium: 4 mEq/L (ref 3.3–4.7)

## 2012-07-03 LAB — PROTIME-INR: Protime: 33.6 Seconds — ABNORMAL HIGH (ref 10.6–13.4)

## 2012-07-03 LAB — CBC WITH DIFFERENTIAL/PLATELET
BASO%: 0.3 % (ref 0.0–2.0)
EOS%: 1.3 % (ref 0.0–7.0)
HCT: 39.4 % (ref 34.8–46.6)
LYMPH%: 16.3 % (ref 14.0–49.7)
MCH: 33.2 pg (ref 25.1–34.0)
MCHC: 34.2 g/dL (ref 31.5–36.0)
NEUT%: 75.9 % (ref 38.4–76.8)
RBC: 4.06 10*6/uL (ref 3.70–5.45)
lymph#: 1.3 10*3/uL (ref 0.9–3.3)

## 2012-07-03 LAB — CEA: CEA: 0.6 ng/mL (ref 0.0–5.0)

## 2012-07-03 MED ORDER — IOHEXOL 300 MG/ML  SOLN
100.0000 mL | Freq: Once | INTRAMUSCULAR | Status: AC | PRN
  Administered 2012-07-03: 100 mL via INTRAVENOUS

## 2012-07-07 ENCOUNTER — Telehealth: Payer: Self-pay | Admitting: *Deleted

## 2012-07-07 ENCOUNTER — Ambulatory Visit (HOSPITAL_BASED_OUTPATIENT_CLINIC_OR_DEPARTMENT_OTHER): Payer: Medicare Other | Admitting: Oncology

## 2012-07-07 ENCOUNTER — Encounter: Payer: Self-pay | Admitting: Oncology

## 2012-07-07 VITALS — BP 121/76 | HR 80 | Temp 98.7°F | Resp 20 | Ht 65.5 in | Wt 185.9 lb

## 2012-07-07 DIAGNOSIS — C189 Malignant neoplasm of colon, unspecified: Secondary | ICD-10-CM | POA: Diagnosis not present

## 2012-07-07 DIAGNOSIS — I82409 Acute embolism and thrombosis of unspecified deep veins of unspecified lower extremity: Secondary | ICD-10-CM

## 2012-07-07 DIAGNOSIS — D0510 Intraductal carcinoma in situ of unspecified breast: Secondary | ICD-10-CM

## 2012-07-07 DIAGNOSIS — D0590 Unspecified type of carcinoma in situ of unspecified breast: Secondary | ICD-10-CM

## 2012-07-07 NOTE — Patient Instructions (Addendum)
Restaging scans in early January  I will see you after that in January 2014

## 2012-07-07 NOTE — Telephone Encounter (Signed)
Gave patient appointment for 12-08-2012 arrival time 10:15am scan

## 2012-07-07 NOTE — Progress Notes (Signed)
OFFICE PROGRESS NOTE  CC  [name], MD [address] 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 Thus far she has received 8 cycles of FOLFOX Avastin. Most recently we discontinued it due to side effects and she has been on observation only.  #2 patient also has DCIS of the left breast she underwent a lumpectomy followed by radiation and now is on Aromasin 25 mg daily.  #3 patient previously was on tamoxifen and unfortunate she developed a deep venous thrombosis. She is now on Coumadin to titrate her INR between 2 and 2.5. This is being managed by [facility] at [facility].  CURRENT THERAPY: Aromasin 25 mg daily and observation for colon cancer  INTERVAL HISTORY: Susan Duffy 66 y.o. female returns for Followup visit today.Clinically she is doing well. Her INRs I have reviewed and they have all been therapeutic. Today she is without any pain she denies any headaches double vision blurring of vision nausea vomiting fevers chills abdominal pain no peripheral paresthesias no hematuria hematochezia melena she has no breast tenderness. Remainder of the 10 point review of systems is negative.  MEDICAL HISTORY: Past Medical History  Diagnosis Date  . Anemia     iron defiency  . Arthritis     osteoarthritis  . Allergy   . TIA (transient ischemic attack) 03/08/2011  . Hypertension   . Cancer 12/25/08    Left DCIS,ERPR+,Metastatic colon     ALLERGIES:  is allergic to amoxicillin and erythromycin.  MEDICATIONS:  Current Outpatient Prescriptions  Medication Sig Dispense Refill  .  acetaminophen (TYLENOL) 500 MG tablet as needed. Take 1-2 by mouth every 6 hours as needed       . aspirin 81 MG tablet Take 81 mg by mouth daily.        . Cyanocobalamin (VITAMIN B-12) 2000 MCG TBCR Take 1 tablet by mouth daily.        Marland Kitchen exemestane (AROMASIN) 25 MG tablet TAKE 1 TABLET DAILY AFTER A MEAL.  90 tablet  0  . irbesartan (AVAPRO) 150 MG tablet TAKE 1 TABLET BY MOUTH EVERY DAY  90 tablet  1  . simvastatin (ZOCOR) 20 MG tablet TAKE 1 TABLET (20 MG TOTAL) BY MOUTH AT BEDTIME.  90 tablet  2  . warfarin (COUMADIN) 2.5 MG tablet Take 2.5 mg by mouth. As directed per Heme Onc office. Patient takes 2.5 mg four days per week, and the 5 mg dosage the other 3 days. Per patient as of 02/01/12. System would not allow notation to be made on 5 mg due to prescription attached.      . warfarin (COUMADIN) 5 MG tablet USE AS DIRECTED BY COUMADIN CLINIC  30 tablet  3    SURGICAL HISTORY:  Past Surgical History  Procedure Date  . Colon surgery     colectomy, partial  . Breast biopsy 1-10    fibroma removal, s/p lumpectomy and radiation   . Lobectomy 06-2009    metastatic colon cancer  . Breast lumpectomy 2010    left breast    REVIEW OF SYSTEMS:  Pertinent items are  noted in HPI.   PHYSICAL EXAMINATION: General appearance: alert, cooperative and appears stated age Neck: no adenopathy, no carotid bruit, no JVD, supple, symmetrical, trachea midline and thyroid not enlarged, symmetric, no tenderness/mass/nodules Lymph nodes: Cervical, supraclavicular, and axillary nodes normal. Resp: clear to auscultation bilaterally and normal percussion bilaterally Back: symmetric, no curvature. ROM normal. No CVA tenderness. Abdomen: Soft nontender nondistended bowel sounds are present no other splenomegaly. Extremities: No edema clubbing or cyanosis. Neuro: Alert oriented strength is symmetrical in upper and lower extremities otherwise nonfocal.  ECOG PERFORMANCE STATUS: 0 - Asymptomatic  Blood pressure  121/76, pulse 80, temperature 98.7 F (37.1 C), temperature source Oral, resp. rate 20, height 5' 5.5" (1.664 m), weight 185 lb 14.4 oz (84.324 kg).  LABORATORY DATA: Lab Results  Component Value Date   WBC 7.8 07/03/2012   HGB 13.5 07/03/2012   HCT 39.4 07/03/2012   MCV 97.0 07/03/2012   PLT 241 07/03/2012      Chemistry      Component Value Date/Time   NA 142 07/03/2012 1108   NA 139 05/12/2012 0912   K 4.0 07/03/2012 1108   K 3.7 05/12/2012 0912   CL 98 07/03/2012 1108   CL 106 05/12/2012 0912   CO2 27 07/03/2012 1108   CO2 29 05/12/2012 0912   BUN 23* 07/03/2012 1108   BUN 18 05/12/2012 0912   CREATININE 1.1 07/03/2012 1108   CREATININE 0.9 05/12/2012 0912      Component Value Date/Time   CALCIUM 9.8 07/03/2012 1108   CALCIUM 9.1 05/12/2012 0912   ALKPHOS 117* 07/03/2012 1108   ALKPHOS 121* 04/03/2012 0905   AST 29 07/03/2012 1108   AST 37 04/03/2012 0905   ALT 35 04/03/2012 0905   BILITOT 0.80 07/03/2012 1108   BILITOT 0.3 04/03/2012 0905       RADIOGRAPHIC STUDIES:  CT CHEST, ABDOMEN AND PELVIS WITH CONTRAST  Technique: Multidetector CT imaging of the chest, abdomen and  pelvis was performed following the standard protocol during bolus  administration of intravenous contrast.  Contrast: OMNIPAQUE IOHEXOL 300 MG/ML SOLN  Comparison: Multiple priors, most recently a CT of the chest,  abdomen and pelvis dated 03/13/2012.  CT CHEST  Findings:  Mediastinum: Heart size is normal. There is no significant  pericardial fluid, thickening or pericardial calcification. There  is a small hiatal hernia. No pathologically enlarged mediastinal or  hilar lymph nodes. Left-sided subclavian single lumen Port-A-Cath  with tip terminating at the superior cavoatrial junction.  Lungs/Pleura: Status post left lower lobectomy. Compensatory  hyperexpansion of the left upper lobe, although there continues to  be some mild right to left shift of cardiomediastinal structures.  No suspicious appearing  pulmonary nodules or masses are identified.  No consolidative airspace disease. No pleural effusions.  Musculoskeletal: There are no aggressive appearing lytic or blastic  lesions noted in the visualized portions of the skeleton. 12 mm  soft tissue attenuation nodule in the medial aspect of the right  breast is smoothly marginated and unchanged in size compared to  prior examinations, presumably a benign finding such as a  intraparenchymal lymph node or fibroadenoma. A focal area of  architectural distortion in the left breast is unchanged, likely to  represent a site of prior lumpectomy.  IMPRESSION:  1. No suspicious findings to suggest new metastatic disease to the  thorax.  2. Status post left lower lobectomy.  3. Postoperative changes and support apparatus, as above.  CT ABDOMEN AND PELVIS  Findings:  Abdomen/Pelvis:  The enhanced appearance of the liver, gallbladder,  pancreas, spleen, bilateral adrenal glands and to the right kidney  is unremarkable. In the anterior aspect of the left kidney there  is a 5.8 x 4.7 cm cyst that has a tiny punctate area of  calcification along the anteromedial margin (unchanged).  Status post partial sigmoidectomy. No abnormal soft tissue mass  around the suture line to suggest local recurrence of disease. No  ascites or pneumoperitoneum and no pathologic distension of bowel.  No definite pathologic lymphadenopathy identified within the  abdomen or pelvis. Appendix is normal in appearance. Uterus,  bilateral ovaries and urinary bladder are unremarkable in  appearance.  Musculoskeletal: There are no aggressive appearing lytic or blastic  lesions noted in the visualized portions of the skeleton. Slight  interval increase and 1.9 cm low attenuation (negative 35 HU)  lesion in the subcutaneous fat of the right anterior abdominal wall  (image 73 of series 2) is nonspecific, but favored to represent an  area of evolving fat necrosis.  IMPRESSION:  1.  No findings to suggest metastatic disease to the abdomen or  pelvis.  2. Status post partial sigmoidectomy.  3. Large cyst in the anterior aspect of the left kidney is  unchanged.  4. Normal appendix.  5. Slight interval increase in size of low attenuation  subcutaneous lesion in the right anterior abdominal wall  subcutaneous fat. Overall, this lesion is most consistent with an  area of fat necrosis, however, continued attention on follow-up  studies is recommended.    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 She is noted to have a abdominal subcutaneous nodule near the umbilicus unclear etiology we will continue to monitor this.  #2 DCIS of the left rest status post lumpectomy followed by radiation and now on Aromasin and tolerating it well.  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   PLAN:  #1 patient will be seen back in 6 month's time with restaging scans.  #2 she will continue to be seen at her primary care physician's office for monitoring of Coumadin levels.  #3 she will continue Aromasin for DCIS.  #4 patient knows to call me with any problems questions or concerns.  All questions were answered. The patient knows to call the clinic with any problems, questions or concerns. We can certainly see the patient much sooner if necessary.  I spent 25 minutes counseling the patient face to face. The total time spent in the appointment was 30 minutes.    Drue Second, MD Medical/Oncology [facility] [phone] (beeper) [phone] (Office)  07/07/2012, 11:18 AM

## 2012-07-10 ENCOUNTER — Ambulatory Visit (INDEPENDENT_AMBULATORY_CARE_PROVIDER_SITE_OTHER): Payer: Medicare Other | Admitting: General Surgery

## 2012-07-10 ENCOUNTER — Encounter (INDEPENDENT_AMBULATORY_CARE_PROVIDER_SITE_OTHER): Payer: Self-pay | Admitting: General Surgery

## 2012-07-10 VITALS — BP 104/72 | HR 66 | Temp 97.1°F | Resp 14 | Ht 65.5 in | Wt 186.1 lb

## 2012-07-10 DIAGNOSIS — C50919 Malignant neoplasm of unspecified site of unspecified female breast: Secondary | ICD-10-CM | POA: Diagnosis not present

## 2012-07-10 NOTE — Patient Instructions (Signed)
Continue aromasin. 

## 2012-07-18 ENCOUNTER — Ambulatory Visit (INDEPENDENT_AMBULATORY_CARE_PROVIDER_SITE_OTHER): Payer: Medicare Other | Admitting: Family Medicine

## 2012-07-18 DIAGNOSIS — I82409 Acute embolism and thrombosis of unspecified deep veins of unspecified lower extremity: Secondary | ICD-10-CM | POA: Diagnosis not present

## 2012-07-18 NOTE — Patient Instructions (Signed)
2.5 mg daily  Take a 2.5mg  today then resume eat some greens check 2 weeks),5 mg T,TH, sat.

## 2012-08-01 ENCOUNTER — Ambulatory Visit (INDEPENDENT_AMBULATORY_CARE_PROVIDER_SITE_OTHER): Payer: Medicare Other | Admitting: Family Medicine

## 2012-08-01 DIAGNOSIS — I82409 Acute embolism and thrombosis of unspecified deep veins of unspecified lower extremity: Secondary | ICD-10-CM

## 2012-08-01 LAB — POCT INR: INR: 2.9

## 2012-08-01 NOTE — Patient Instructions (Signed)
Continue  2.5 mg daily ,5 mg T,TH, sat recheck 4 weeks

## 2012-08-03 DIAGNOSIS — Z1211 Encounter for screening for malignant neoplasm of colon: Secondary | ICD-10-CM | POA: Diagnosis not present

## 2012-08-03 DIAGNOSIS — K648 Other hemorrhoids: Secondary | ICD-10-CM | POA: Diagnosis not present

## 2012-08-03 DIAGNOSIS — Z85038 Personal history of other malignant neoplasm of large intestine: Secondary | ICD-10-CM | POA: Diagnosis not present

## 2012-08-03 DIAGNOSIS — Z86010 Personal history of colonic polyps: Secondary | ICD-10-CM | POA: Diagnosis not present

## 2012-08-06 HISTORY — PX: OTHER SURGICAL HISTORY: SHX169

## 2012-08-14 ENCOUNTER — Other Ambulatory Visit: Payer: Self-pay | Admitting: Oncology

## 2012-08-14 ENCOUNTER — Ambulatory Visit (HOSPITAL_BASED_OUTPATIENT_CLINIC_OR_DEPARTMENT_OTHER): Payer: Medicare Other

## 2012-08-14 VITALS — BP 111/71 | HR 73 | Temp 97.9°F

## 2012-08-14 DIAGNOSIS — Z452 Encounter for adjustment and management of vascular access device: Secondary | ICD-10-CM

## 2012-08-14 DIAGNOSIS — C189 Malignant neoplasm of colon, unspecified: Secondary | ICD-10-CM | POA: Diagnosis not present

## 2012-08-14 DIAGNOSIS — D0590 Unspecified type of carcinoma in situ of unspecified breast: Secondary | ICD-10-CM

## 2012-08-14 DIAGNOSIS — C50919 Malignant neoplasm of unspecified site of unspecified female breast: Secondary | ICD-10-CM

## 2012-08-14 MED ORDER — SODIUM CHLORIDE 0.9 % IJ SOLN
10.0000 mL | INTRAMUSCULAR | Status: DC | PRN
  Administered 2012-08-14: 10 mL via INTRAVENOUS
  Filled 2012-08-14: qty 10

## 2012-08-14 MED ORDER — HEPARIN SOD (PORK) LOCK FLUSH 100 UNIT/ML IV SOLN
500.0000 [IU] | Freq: Once | INTRAVENOUS | Status: AC
  Administered 2012-08-14: 500 [IU] via INTRAVENOUS
  Filled 2012-08-14: qty 5

## 2012-08-14 NOTE — Progress Notes (Signed)
Pt in for port flush. Feeling well.  On coumadin currently and being followed by [place] in [facility].  Increase bleeding post port access to site.  Extra pressure applied and teaching completed re: monitor bleeding and report prn.  No other new issues. HL

## 2012-08-14 NOTE — Patient Instructions (Addendum)
[facility] Discharge Instructions for Patients Receiving port flush    If it is after clinic hours your family physician or the after hours number for the clinic or go to the Emergency Department.   BELOW ARE SYMPTOMS THAT SHOULD BE REPORTED IMMEDIATELY:  *FEVER GREATER THAN 100.5 F  *CHILLS WITH OR WITHOUT FEVER  NAUSEA AND VOMITING THAT IS NOT CONTROLLED WITH YOUR NAUSEA MEDICATION  *UNUSUAL SHORTNESS OF BREATH  *UNUSUAL BRUISING OR BLEEDING  TENDERNESS IN MOUTH AND THROAT WITH OR WITHOUT PRESENCE OF ULCERS  *URINARY PROBLEMS  *BOWEL PROBLEMS  UNUSUAL RASH Items with * indicate a potential emergency and should be followed up as soon as possible.  . Feel free to call the clinic you have any questions or concerns. The clinic phone number is [phone].   I have been informed and understand all the instructions given to me. I know to contact the clinic, my physician, or go to the Emergency Department if any problems should occur. I do not have any questions at this time, but understand that I may call the clinic during office hours   should I have any questions or need assistance in obtaining follow up care.    __________________________________________  _____________  __________ Signature of Patient or Authorized Representative            Date                   Time    __________________________________________ Nurse's Signature    

## 2012-08-14 NOTE — Progress Notes (Signed)
Subjective:     Patient ID: Susan Duffy, female   DOB: 01-20-1946, 66 y.o.   MRN: [id number]  HPI The pt is a 66yo wf who is 3 1/2 years s/p left breast lumpectomy for DCIS. She also has metastatic colon cancer. Her disease has been stable. She has no complaints today. She is still taking aromasin and is tolerating it well.  Review of Systems  Constitutional: Negative.   HENT: Negative.   Eyes: Negative.   Respiratory: Negative.   Cardiovascular: Negative.   Gastrointestinal: Negative.   Genitourinary: Negative.   Musculoskeletal: Negative.   Skin: Negative.   Neurological: Negative.   Hematological: Negative.   Psychiatric/Behavioral: Negative.        Objective:   Physical Exam  Constitutional: She is oriented to person, place, and time. She appears well-developed and well-nourished.  HENT:  Head: Normocephalic and atraumatic.  Eyes: Conjunctivae and EOM are normal. Pupils are equal, round, and reactive to light.  Neck: Normal range of motion. Neck supple.  Cardiovascular: Normal rate, regular rhythm and normal heart sounds.   Pulmonary/Chest: Effort normal and breath sounds normal.       No palpable mass in either breast. No palpable axillary, supraclavicular, or cervical lymphadenopathy. No groin adenopathy  Abdominal: Soft. Bowel sounds are normal. She exhibits no mass. There is no tenderness.  Musculoskeletal: Normal range of motion.  Lymphadenopathy:    She has no cervical adenopathy.  Neurological: She is alert and oriented to person, place, and time.  Skin: Skin is warm and dry.  Psychiatric: She has a normal mood and affect. Her behavior is normal.       Assessment:     3 1/2 years s/p left lumpectomy for DCIS and metastatic colon cancer    Plan:     She continues to do well. She will continue aromasin. She will continue to do regular self exam. We will plan to see her back in 6 months

## 2012-08-23 DIAGNOSIS — Z23 Encounter for immunization: Secondary | ICD-10-CM | POA: Diagnosis not present

## 2012-08-25 ENCOUNTER — Inpatient Hospital Stay: Payer: Self-pay | Admitting: Orthopedic Surgery

## 2012-08-25 DIAGNOSIS — I82409 Acute embolism and thrombosis of unspecified deep veins of unspecified lower extremity: Secondary | ICD-10-CM | POA: Diagnosis not present

## 2012-08-25 DIAGNOSIS — Z86718 Personal history of other venous thrombosis and embolism: Secondary | ICD-10-CM | POA: Diagnosis not present

## 2012-08-25 DIAGNOSIS — IMO0002 Reserved for concepts with insufficient information to code with codable children: Secondary | ICD-10-CM | POA: Diagnosis not present

## 2012-08-25 DIAGNOSIS — I10 Essential (primary) hypertension: Secondary | ICD-10-CM | POA: Diagnosis not present

## 2012-08-25 DIAGNOSIS — E7800 Pure hypercholesterolemia, unspecified: Secondary | ICD-10-CM | POA: Diagnosis present

## 2012-08-25 DIAGNOSIS — Z7901 Long term (current) use of anticoagulants: Secondary | ICD-10-CM | POA: Diagnosis not present

## 2012-08-25 DIAGNOSIS — S99919A Unspecified injury of unspecified ankle, initial encounter: Secondary | ICD-10-CM | POA: Diagnosis not present

## 2012-08-25 DIAGNOSIS — S8263XA Displaced fracture of lateral malleolus of unspecified fibula, initial encounter for closed fracture: Secondary | ICD-10-CM | POA: Diagnosis not present

## 2012-08-25 DIAGNOSIS — C7800 Secondary malignant neoplasm of unspecified lung: Secondary | ICD-10-CM | POA: Diagnosis not present

## 2012-08-25 DIAGNOSIS — Z7982 Long term (current) use of aspirin: Secondary | ICD-10-CM | POA: Diagnosis not present

## 2012-08-25 DIAGNOSIS — Z23 Encounter for immunization: Secondary | ICD-10-CM | POA: Diagnosis not present

## 2012-08-25 DIAGNOSIS — S82853A Displaced trimalleolar fracture of unspecified lower leg, initial encounter for closed fracture: Secondary | ICD-10-CM | POA: Diagnosis not present

## 2012-08-25 DIAGNOSIS — Z85038 Personal history of other malignant neoplasm of large intestine: Secondary | ICD-10-CM | POA: Diagnosis not present

## 2012-08-25 DIAGNOSIS — Z043 Encounter for examination and observation following other accident: Secondary | ICD-10-CM | POA: Diagnosis not present

## 2012-08-25 DIAGNOSIS — Z853 Personal history of malignant neoplasm of breast: Secondary | ICD-10-CM | POA: Diagnosis not present

## 2012-08-25 DIAGNOSIS — S82843A Displaced bimalleolar fracture of unspecified lower leg, initial encounter for closed fracture: Secondary | ICD-10-CM | POA: Diagnosis not present

## 2012-08-25 DIAGNOSIS — Z01810 Encounter for preprocedural cardiovascular examination: Secondary | ICD-10-CM | POA: Diagnosis not present

## 2012-08-25 DIAGNOSIS — M25579 Pain in unspecified ankle and joints of unspecified foot: Secondary | ICD-10-CM | POA: Diagnosis not present

## 2012-08-25 DIAGNOSIS — S99929A Unspecified injury of unspecified foot, initial encounter: Secondary | ICD-10-CM | POA: Diagnosis not present

## 2012-08-25 DIAGNOSIS — S82899A Other fracture of unspecified lower leg, initial encounter for closed fracture: Secondary | ICD-10-CM | POA: Diagnosis not present

## 2012-08-25 DIAGNOSIS — S8990XA Unspecified injury of unspecified lower leg, initial encounter: Secondary | ICD-10-CM | POA: Diagnosis not present

## 2012-08-25 LAB — CBC WITH DIFFERENTIAL/PLATELET
Eosinophil %: 0.5 %
Lymphocyte #: 0.6 10*3/uL — ABNORMAL LOW (ref 1.0–3.6)
MCHC: 34.7 g/dL (ref 32.0–36.0)
Monocyte %: 5.5 %
Neutrophil %: 88.2 %
Platelet: 225 10*3/uL (ref 150–440)
RBC: 4.33 10*6/uL (ref 3.80–5.20)
WBC: 12.4 10*3/uL — ABNORMAL HIGH (ref 3.6–11.0)

## 2012-08-25 LAB — BASIC METABOLIC PANEL
Anion Gap: 9 (ref 7–16)
BUN: 22 mg/dL — ABNORMAL HIGH (ref 7–18)
Calcium, Total: 10 mg/dL (ref 8.5–10.1)
EGFR (Non-African Amer.): 56 — ABNORMAL LOW
Glucose: 121 mg/dL — ABNORMAL HIGH (ref 65–99)
Osmolality: 282 (ref 275–301)
Sodium: 139 mmol/L (ref 136–145)

## 2012-08-25 LAB — PROTIME-INR
INR: 2.4
Prothrombin Time: 26.1 secs — ABNORMAL HIGH (ref 11.5–14.7)

## 2012-08-26 LAB — APTT
Activated PTT: >160 secs (ref 23.6–35.9)
Activated PTT: 156.9 secs — ABNORMAL HIGH (ref 23.6–35.9)

## 2012-08-26 LAB — PROTIME-INR: Prothrombin Time: 28.1 secs — ABNORMAL HIGH (ref 11.5–14.7)

## 2012-08-27 LAB — PROTIME-INR
INR: 2.1
Prothrombin Time: 23.7 secs — ABNORMAL HIGH (ref 11.5–14.7)

## 2012-08-27 LAB — APTT: Activated PTT: 86.8 secs — ABNORMAL HIGH (ref 23.6–35.9)

## 2012-08-28 DIAGNOSIS — S82843A Displaced bimalleolar fracture of unspecified lower leg, initial encounter for closed fracture: Secondary | ICD-10-CM | POA: Diagnosis not present

## 2012-08-28 LAB — PLATELET COUNT: Platelet: 178 10*3/uL (ref 150–440)

## 2012-08-28 LAB — PROTIME-INR
INR: 1.4
INR: 1.2
Prothrombin Time: 17.1 secs — ABNORMAL HIGH (ref 11.5–14.7)

## 2012-08-29 ENCOUNTER — Ambulatory Visit: Payer: Medicare Other

## 2012-08-29 LAB — PLATELET COUNT: Platelet: 206 10*3/uL (ref 150–440)

## 2012-08-29 LAB — APTT
Activated PTT: 61.5 secs — ABNORMAL HIGH (ref 23.6–35.9)
Activated PTT: 58.7 secs — ABNORMAL HIGH (ref 23.6–35.9)

## 2012-08-29 LAB — PROTIME-INR: Prothrombin Time: 16.8 secs — ABNORMAL HIGH (ref 11.5–14.7)

## 2012-08-30 LAB — PROTIME-INR
INR: 2.6
Prothrombin Time: 28.4 secs — ABNORMAL HIGH (ref 11.5–14.7)

## 2012-09-01 ENCOUNTER — Other Ambulatory Visit: Payer: Self-pay | Admitting: Family Medicine

## 2012-09-01 ENCOUNTER — Telehealth: Payer: Self-pay

## 2012-09-01 DIAGNOSIS — R269 Unspecified abnormalities of gait and mobility: Secondary | ICD-10-CM | POA: Diagnosis not present

## 2012-09-01 DIAGNOSIS — IMO0001 Reserved for inherently not codable concepts without codable children: Secondary | ICD-10-CM | POA: Diagnosis not present

## 2012-09-01 DIAGNOSIS — C7800 Secondary malignant neoplasm of unspecified lung: Secondary | ICD-10-CM | POA: Diagnosis not present

## 2012-09-01 DIAGNOSIS — Z7901 Long term (current) use of anticoagulants: Secondary | ICD-10-CM | POA: Diagnosis not present

## 2012-09-01 DIAGNOSIS — Z5181 Encounter for therapeutic drug level monitoring: Secondary | ICD-10-CM | POA: Diagnosis not present

## 2012-09-01 DIAGNOSIS — Z86718 Personal history of other venous thrombosis and embolism: Secondary | ICD-10-CM | POA: Diagnosis not present

## 2012-09-01 LAB — PROTIME-INR
INR: 4.8
Prothrombin Time: 44.9 secs — ABNORMAL HIGH (ref 11.5–14.7)

## 2012-09-01 NOTE — Telephone Encounter (Signed)
Call from [place] at 8:20 pm- venous draw of INR is 4.8 This is reassuring She will hold coumadin over weekend and re check on monday

## 2012-09-01 NOTE — Telephone Encounter (Signed)
Thanks -I am on call and will be getting the result  I will also route to Camelia Eng so she is aware

## 2012-09-01 NOTE — Telephone Encounter (Signed)
Spoke with Arline Asp with [name], nurse will make sure pt understands to stop Coumadin for now; [facility] unable to get blood to take to lab; pt has port a cath but [name] does not have supplies to draw from that; sending pt to lab at hospital to have blood test done.

## 2012-09-01 NOTE — Telephone Encounter (Signed)
Thanks- let Dr Ermalene Searing know I am doctor on call  Advise pt to stop her coumadin for now - I'm sure you already did

## 2012-09-01 NOTE — Telephone Encounter (Signed)
Susan Duffy with [facility] left v/m; Clinician in pts home now for PT/INR. INR 7.8  Clinician is drawing blood to take to lab for testing for stat PT/INR; will call results to on call doctor for further instructions. Will notify Dr Ermalene Searing verbally now.

## 2012-09-02 DIAGNOSIS — Z7901 Long term (current) use of anticoagulants: Secondary | ICD-10-CM | POA: Diagnosis not present

## 2012-09-02 DIAGNOSIS — C7800 Secondary malignant neoplasm of unspecified lung: Secondary | ICD-10-CM | POA: Diagnosis not present

## 2012-09-02 DIAGNOSIS — R269 Unspecified abnormalities of gait and mobility: Secondary | ICD-10-CM | POA: Diagnosis not present

## 2012-09-02 DIAGNOSIS — IMO0001 Reserved for inherently not codable concepts without codable children: Secondary | ICD-10-CM | POA: Diagnosis not present

## 2012-09-02 DIAGNOSIS — Z5181 Encounter for therapeutic drug level monitoring: Secondary | ICD-10-CM | POA: Diagnosis not present

## 2012-09-02 NOTE — Telephone Encounter (Signed)
Noted  

## 2012-09-04 ENCOUNTER — Encounter: Payer: Self-pay | Admitting: Family Medicine

## 2012-09-04 ENCOUNTER — Telehealth: Payer: Self-pay | Admitting: Family Medicine

## 2012-09-04 DIAGNOSIS — Z7901 Long term (current) use of anticoagulants: Secondary | ICD-10-CM | POA: Diagnosis not present

## 2012-09-04 DIAGNOSIS — C7800 Secondary malignant neoplasm of unspecified lung: Secondary | ICD-10-CM | POA: Diagnosis not present

## 2012-09-04 DIAGNOSIS — R269 Unspecified abnormalities of gait and mobility: Secondary | ICD-10-CM | POA: Diagnosis not present

## 2012-09-04 DIAGNOSIS — IMO0001 Reserved for inherently not codable concepts without codable children: Secondary | ICD-10-CM | POA: Diagnosis not present

## 2012-09-04 DIAGNOSIS — Z5181 Encounter for therapeutic drug level monitoring: Secondary | ICD-10-CM | POA: Diagnosis not present

## 2012-09-04 NOTE — Telephone Encounter (Signed)
Cc: Susan Duffy

## 2012-09-04 NOTE — Telephone Encounter (Signed)
[facility] Triage Call Report Triage Record Num: [id number] Operator: Thayer Headings Patient Name: Susan Duffy Call Date & Time: 09/01/2012 8:20:01PM Patient Phone: [phone] PCP: [name] Patient Gender: Female PCP Fax : [phone] Patient DOB: [birth year] Practice Name: [facility] Reason for Call: Caller: Gunnar Fusi; PCP: [name] (Family Practice); CB#: [phone]; Call regarding Gunnar Fusi is calling from [facility] regarding a PT/INR ordered on Susan Duffy by [name] [facility]). Collected today 09/01/12 at 7:26 PM, stat lab, results PT 44.9 (11.5-14.7), INR 4.8 >4 is Critical. Triager pulled chart and see where Dr. Milinda Antis has already been notified of this result and notation made at 8:26 PM this evening, stating, "Call from [place] at 8:20 pm- venous draw of INR is 4.8, this is reassuring, she will hold coumadin over weekend and re check on monday". Triager contacted MD to make sure patient was already informed of this or if triage nurse to call and advise. Dr. Milinda Antis said she has already talked with [facility] nurse and they are informing patient. No further action required. Protocol(s) Used: Office Note Recommended Outcome per Protocol: Information Noted and Sent to Office Reason for Outcome: Caller information to office Care Advice: ~ 09/01/2012 8:33:53PM Page 1 of 1 [id number]

## 2012-09-04 NOTE — Telephone Encounter (Signed)
Cindy with home health notified as instructed by telephone.

## 2012-09-04 NOTE — Telephone Encounter (Signed)
Hold until today, will cc: Dr. Ermalene Searing for direction starting tomorrow

## 2012-09-04 NOTE — Telephone Encounter (Signed)
Caller: [facility]; Patient Name: Susan Duffy; PCP: [name] [facility]); Best Callback Phone Number: [phone] [facility] calling and states patient had INR 3.7 drawn 9-30. Coumadin has been on hold since being discharged from hospital 9-27. INR was 7.8 9-27 and medicine has been on hold. Takes Coumadin 5mg  daily.  Denies bleeding. PLEASDE CALL AND ADVISE WHAT SHOULD

## 2012-09-04 NOTE — Telephone Encounter (Signed)
All noted.  

## 2012-09-05 NOTE — Telephone Encounter (Signed)
Pt was to come in to have INR, PT recehck.. Has she done so. If not ... We need her to do this.

## 2012-09-05 NOTE — Telephone Encounter (Signed)
Have pt take 2.5 mg daily and 5 mg on M, W. F.  Recheck in 1 week for PT/INR.

## 2012-09-05 NOTE — Telephone Encounter (Signed)
Cindy at [facility] notified as instructed by telephone. Arline Asp will contact pt.

## 2012-09-05 NOTE — Telephone Encounter (Signed)
Spoke with [place] home health; pt has not taken Coumadin since d/c from hospital on 08/31/12; When in hospital pt was taking coumadin 5 mg daily.Please advise. Arline Asp can be reached [phone].

## 2012-09-08 DIAGNOSIS — R269 Unspecified abnormalities of gait and mobility: Secondary | ICD-10-CM | POA: Diagnosis not present

## 2012-09-08 DIAGNOSIS — Z5181 Encounter for therapeutic drug level monitoring: Secondary | ICD-10-CM | POA: Diagnosis not present

## 2012-09-08 DIAGNOSIS — Z7901 Long term (current) use of anticoagulants: Secondary | ICD-10-CM | POA: Diagnosis not present

## 2012-09-08 DIAGNOSIS — IMO0001 Reserved for inherently not codable concepts without codable children: Secondary | ICD-10-CM | POA: Diagnosis not present

## 2012-09-08 DIAGNOSIS — C7800 Secondary malignant neoplasm of unspecified lung: Secondary | ICD-10-CM | POA: Diagnosis not present

## 2012-09-11 DIAGNOSIS — R269 Unspecified abnormalities of gait and mobility: Secondary | ICD-10-CM | POA: Diagnosis not present

## 2012-09-11 DIAGNOSIS — IMO0001 Reserved for inherently not codable concepts without codable children: Secondary | ICD-10-CM | POA: Diagnosis not present

## 2012-09-11 DIAGNOSIS — C7800 Secondary malignant neoplasm of unspecified lung: Secondary | ICD-10-CM | POA: Diagnosis not present

## 2012-09-11 DIAGNOSIS — Z7901 Long term (current) use of anticoagulants: Secondary | ICD-10-CM | POA: Diagnosis not present

## 2012-09-11 DIAGNOSIS — Z5181 Encounter for therapeutic drug level monitoring: Secondary | ICD-10-CM | POA: Diagnosis not present

## 2012-09-13 ENCOUNTER — Telehealth: Payer: Self-pay | Admitting: Family Medicine

## 2012-09-13 DIAGNOSIS — S8290XD Unspecified fracture of unspecified lower leg, subsequent encounter for closed fracture with routine healing: Secondary | ICD-10-CM | POA: Diagnosis not present

## 2012-09-13 DIAGNOSIS — IMO0001 Reserved for inherently not codable concepts without codable children: Secondary | ICD-10-CM | POA: Diagnosis not present

## 2012-09-13 DIAGNOSIS — R269 Unspecified abnormalities of gait and mobility: Secondary | ICD-10-CM | POA: Diagnosis not present

## 2012-09-13 DIAGNOSIS — Z5181 Encounter for therapeutic drug level monitoring: Secondary | ICD-10-CM | POA: Diagnosis not present

## 2012-09-13 DIAGNOSIS — C7800 Secondary malignant neoplasm of unspecified lung: Secondary | ICD-10-CM | POA: Diagnosis not present

## 2012-09-13 DIAGNOSIS — Z7901 Long term (current) use of anticoagulants: Secondary | ICD-10-CM | POA: Diagnosis not present

## 2012-09-13 NOTE — Telephone Encounter (Signed)
Recheck 2 weeks

## 2012-09-13 NOTE — Telephone Encounter (Signed)
Caller: Cindy/Home Health Nurse with [facility] Patient Name: Susan Duffy; PCP: [name] [facility]); Best Callback Phone Number: [phone]. Arline Asp , Home Health Nurse states she received faxed order from Dr. Dallas Schimke for Coumadin change on patient. Orders did not include when MD would like to recheck patient's INR. May call Cindy with order at [phone] or fax order at fax # [phone].

## 2012-09-14 DIAGNOSIS — C7800 Secondary malignant neoplasm of unspecified lung: Secondary | ICD-10-CM | POA: Diagnosis not present

## 2012-09-14 DIAGNOSIS — Z5181 Encounter for therapeutic drug level monitoring: Secondary | ICD-10-CM | POA: Diagnosis not present

## 2012-09-14 DIAGNOSIS — Z7901 Long term (current) use of anticoagulants: Secondary | ICD-10-CM | POA: Diagnosis not present

## 2012-09-14 DIAGNOSIS — R269 Unspecified abnormalities of gait and mobility: Secondary | ICD-10-CM | POA: Diagnosis not present

## 2012-09-14 DIAGNOSIS — IMO0001 Reserved for inherently not codable concepts without codable children: Secondary | ICD-10-CM | POA: Diagnosis not present

## 2012-09-14 NOTE — Telephone Encounter (Signed)
Left message on VM to recheck in 2 weeks, if any questions or problems to call the office

## 2012-09-20 DIAGNOSIS — Z5181 Encounter for therapeutic drug level monitoring: Secondary | ICD-10-CM | POA: Diagnosis not present

## 2012-09-20 DIAGNOSIS — C7800 Secondary malignant neoplasm of unspecified lung: Secondary | ICD-10-CM | POA: Diagnosis not present

## 2012-09-20 DIAGNOSIS — IMO0001 Reserved for inherently not codable concepts without codable children: Secondary | ICD-10-CM | POA: Diagnosis not present

## 2012-09-20 DIAGNOSIS — Z7901 Long term (current) use of anticoagulants: Secondary | ICD-10-CM | POA: Diagnosis not present

## 2012-09-20 DIAGNOSIS — R269 Unspecified abnormalities of gait and mobility: Secondary | ICD-10-CM | POA: Diagnosis not present

## 2012-09-21 DIAGNOSIS — C7800 Secondary malignant neoplasm of unspecified lung: Secondary | ICD-10-CM | POA: Diagnosis not present

## 2012-09-21 DIAGNOSIS — Z5181 Encounter for therapeutic drug level monitoring: Secondary | ICD-10-CM | POA: Diagnosis not present

## 2012-09-21 DIAGNOSIS — Z7901 Long term (current) use of anticoagulants: Secondary | ICD-10-CM | POA: Diagnosis not present

## 2012-09-21 DIAGNOSIS — IMO0001 Reserved for inherently not codable concepts without codable children: Secondary | ICD-10-CM | POA: Diagnosis not present

## 2012-09-21 DIAGNOSIS — R269 Unspecified abnormalities of gait and mobility: Secondary | ICD-10-CM | POA: Diagnosis not present

## 2012-09-25 ENCOUNTER — Telehealth: Payer: Self-pay | Admitting: Radiology

## 2012-09-25 DIAGNOSIS — C7800 Secondary malignant neoplasm of unspecified lung: Secondary | ICD-10-CM | POA: Diagnosis not present

## 2012-09-25 DIAGNOSIS — Z5181 Encounter for therapeutic drug level monitoring: Secondary | ICD-10-CM | POA: Diagnosis not present

## 2012-09-25 DIAGNOSIS — Z7901 Long term (current) use of anticoagulants: Secondary | ICD-10-CM | POA: Diagnosis not present

## 2012-09-25 DIAGNOSIS — R269 Unspecified abnormalities of gait and mobility: Secondary | ICD-10-CM | POA: Diagnosis not present

## 2012-09-25 DIAGNOSIS — IMO0001 Reserved for inherently not codable concepts without codable children: Secondary | ICD-10-CM | POA: Diagnosis not present

## 2012-09-25 NOTE — Telephone Encounter (Signed)
Will send to Ms. Clent Ridges, please assist.

## 2012-09-25 NOTE — Telephone Encounter (Signed)
Instructions called to the patient, appointment made for 10.31.13

## 2012-09-25 NOTE — Telephone Encounter (Signed)
Susan Duffy with [name] [phone], called an INR 4.9 58.6, dose patient is on, 5 mg daily, except 2.5 mg MWF. Please call patient back with dose instructions

## 2012-09-25 NOTE — Telephone Encounter (Signed)
Call please  Hold coumadin for 2 days, then start new dosing at 5 mg on Monday, Wednesday, and Friday. But 2.5 mg on Tues, Thurs, Sat, and Sunday.   Recheck INR in 2 weeks

## 2012-09-27 DIAGNOSIS — R269 Unspecified abnormalities of gait and mobility: Secondary | ICD-10-CM | POA: Diagnosis not present

## 2012-09-27 DIAGNOSIS — Z7901 Long term (current) use of anticoagulants: Secondary | ICD-10-CM | POA: Diagnosis not present

## 2012-09-27 DIAGNOSIS — C7800 Secondary malignant neoplasm of unspecified lung: Secondary | ICD-10-CM | POA: Diagnosis not present

## 2012-09-27 DIAGNOSIS — IMO0001 Reserved for inherently not codable concepts without codable children: Secondary | ICD-10-CM | POA: Diagnosis not present

## 2012-09-27 DIAGNOSIS — Z5181 Encounter for therapeutic drug level monitoring: Secondary | ICD-10-CM | POA: Diagnosis not present

## 2012-10-05 ENCOUNTER — Other Ambulatory Visit (INDEPENDENT_AMBULATORY_CARE_PROVIDER_SITE_OTHER): Payer: Medicare Other

## 2012-10-05 ENCOUNTER — Telehealth: Payer: Self-pay | Admitting: Family Medicine

## 2012-10-05 ENCOUNTER — Telehealth: Payer: Self-pay

## 2012-10-05 ENCOUNTER — Ambulatory Visit (HOSPITAL_BASED_OUTPATIENT_CLINIC_OR_DEPARTMENT_OTHER): Payer: Medicare Other

## 2012-10-05 ENCOUNTER — Ambulatory Visit (INDEPENDENT_AMBULATORY_CARE_PROVIDER_SITE_OTHER): Payer: Medicare Other | Admitting: Family Medicine

## 2012-10-05 VITALS — BP 117/67 | HR 93 | Temp 98.1°F

## 2012-10-05 DIAGNOSIS — I82409 Acute embolism and thrombosis of unspecified deep veins of unspecified lower extremity: Secondary | ICD-10-CM | POA: Diagnosis not present

## 2012-10-05 DIAGNOSIS — C189 Malignant neoplasm of colon, unspecified: Secondary | ICD-10-CM

## 2012-10-05 DIAGNOSIS — Z452 Encounter for adjustment and management of vascular access device: Secondary | ICD-10-CM

## 2012-10-05 DIAGNOSIS — E7800 Pure hypercholesterolemia, unspecified: Secondary | ICD-10-CM | POA: Diagnosis not present

## 2012-10-05 DIAGNOSIS — C7800 Secondary malignant neoplasm of unspecified lung: Secondary | ICD-10-CM | POA: Diagnosis not present

## 2012-10-05 DIAGNOSIS — Z7901 Long term (current) use of anticoagulants: Secondary | ICD-10-CM | POA: Diagnosis not present

## 2012-10-05 DIAGNOSIS — R7989 Other specified abnormal findings of blood chemistry: Secondary | ICD-10-CM

## 2012-10-05 DIAGNOSIS — E119 Type 2 diabetes mellitus without complications: Secondary | ICD-10-CM

## 2012-10-05 DIAGNOSIS — IMO0001 Reserved for inherently not codable concepts without codable children: Secondary | ICD-10-CM | POA: Diagnosis not present

## 2012-10-05 DIAGNOSIS — Z5181 Encounter for therapeutic drug level monitoring: Secondary | ICD-10-CM | POA: Diagnosis not present

## 2012-10-05 DIAGNOSIS — R269 Unspecified abnormalities of gait and mobility: Secondary | ICD-10-CM | POA: Diagnosis not present

## 2012-10-05 DIAGNOSIS — D509 Iron deficiency anemia, unspecified: Secondary | ICD-10-CM

## 2012-10-05 LAB — LIPID PANEL
Cholesterol: 136 mg/dL (ref 0–200)
VLDL: 29.2 mg/dL (ref 0.0–40.0)

## 2012-10-05 LAB — COMPREHENSIVE METABOLIC PANEL
Albumin: 3.5 g/dL (ref 3.5–5.2)
Alkaline Phosphatase: 98 U/L (ref 39–117)
BUN: 18 mg/dL (ref 6–23)
Calcium: 9.6 mg/dL (ref 8.4–10.5)
Chloride: 104 mEq/L (ref 96–112)
Glucose, Bld: 111 mg/dL — ABNORMAL HIGH (ref 70–99)
Potassium: 3.8 mEq/L (ref 3.5–5.1)

## 2012-10-05 LAB — HEMOGLOBIN A1C: Hgb A1c MFr Bld: 5.3 % (ref 4.6–6.5)

## 2012-10-05 MED ORDER — SODIUM CHLORIDE 0.9 % IJ SOLN
10.0000 mL | INTRAMUSCULAR | Status: DC | PRN
  Administered 2012-10-05: 10 mL via INTRAVENOUS
  Filled 2012-10-05: qty 10

## 2012-10-05 MED ORDER — HEPARIN SOD (PORK) LOCK FLUSH 100 UNIT/ML IV SOLN
500.0000 [IU] | Freq: Once | INTRAVENOUS | Status: AC
  Administered 2012-10-05: 500 [IU] via INTRAVENOUS
  Filled 2012-10-05: qty 5

## 2012-10-05 NOTE — Telephone Encounter (Signed)
Susan Duffy with [name] wants to know if she is to continue drawing PT/INR for pt. Pt had PT/INR today at [facility] lab and has CPX to see Dr Ermalene Searing on 10/12/12.Please advise.

## 2012-10-05 NOTE — Patient Instructions (Signed)
Call MD for problems 

## 2012-10-05 NOTE — Telephone Encounter (Signed)
Message copied by Excell Seltzer on Thu Oct 05, 2012  1:31 AM ------      Message from: [place], [place] J      Created: Wed Oct 04, 2012 11:01 AM      Regarding: Lab order for thursday Oct. 31st        Patient is scheduled for CPX labs, please order future labs, Thanks , Camelia Eng

## 2012-10-05 NOTE — Patient Instructions (Addendum)
2.5 mg daily  recheck 1 week ( patient held 7.5 mg 1 week ago, decreased dose by 7.5 mg today)

## 2012-10-06 DIAGNOSIS — C7800 Secondary malignant neoplasm of unspecified lung: Secondary | ICD-10-CM | POA: Diagnosis not present

## 2012-10-06 DIAGNOSIS — Z5181 Encounter for therapeutic drug level monitoring: Secondary | ICD-10-CM | POA: Diagnosis not present

## 2012-10-06 DIAGNOSIS — R269 Unspecified abnormalities of gait and mobility: Secondary | ICD-10-CM | POA: Diagnosis not present

## 2012-10-06 DIAGNOSIS — IMO0001 Reserved for inherently not codable concepts without codable children: Secondary | ICD-10-CM | POA: Diagnosis not present

## 2012-10-06 NOTE — Telephone Encounter (Signed)
Called and LMOVM for Diane with [name] to return call.

## 2012-10-06 NOTE — Telephone Encounter (Signed)
If she is able to come in to office.Marland Kitchen [facility] no longer needs to check INR. We can do here.

## 2012-10-06 NOTE — Telephone Encounter (Signed)
Diane with [name] notified as instructed by telephone. Diane will contact pt to make sure that is OK with pt and Diane will call back with answer.

## 2012-10-10 DIAGNOSIS — C7800 Secondary malignant neoplasm of unspecified lung: Secondary | ICD-10-CM | POA: Diagnosis not present

## 2012-10-10 DIAGNOSIS — IMO0001 Reserved for inherently not codable concepts without codable children: Secondary | ICD-10-CM | POA: Diagnosis not present

## 2012-10-10 DIAGNOSIS — Z5181 Encounter for therapeutic drug level monitoring: Secondary | ICD-10-CM | POA: Diagnosis not present

## 2012-10-10 DIAGNOSIS — R269 Unspecified abnormalities of gait and mobility: Secondary | ICD-10-CM | POA: Diagnosis not present

## 2012-10-10 DIAGNOSIS — Z7901 Long term (current) use of anticoagulants: Secondary | ICD-10-CM | POA: Diagnosis not present

## 2012-10-12 ENCOUNTER — Ambulatory Visit (INDEPENDENT_AMBULATORY_CARE_PROVIDER_SITE_OTHER): Payer: Medicare Other | Admitting: Family Medicine

## 2012-10-12 ENCOUNTER — Encounter: Payer: Self-pay | Admitting: Family Medicine

## 2012-10-12 VITALS — BP 102/70 | HR 90 | Temp 98.5°F | Ht 65.5 in

## 2012-10-12 DIAGNOSIS — C7800 Secondary malignant neoplasm of unspecified lung: Secondary | ICD-10-CM | POA: Diagnosis not present

## 2012-10-12 DIAGNOSIS — Z780 Asymptomatic menopausal state: Secondary | ICD-10-CM

## 2012-10-12 DIAGNOSIS — E119 Type 2 diabetes mellitus without complications: Secondary | ICD-10-CM

## 2012-10-12 DIAGNOSIS — I10 Essential (primary) hypertension: Secondary | ICD-10-CM

## 2012-10-12 DIAGNOSIS — I82409 Acute embolism and thrombosis of unspecified deep veins of unspecified lower extremity: Secondary | ICD-10-CM | POA: Diagnosis not present

## 2012-10-12 DIAGNOSIS — Z7901 Long term (current) use of anticoagulants: Secondary | ICD-10-CM | POA: Diagnosis not present

## 2012-10-12 DIAGNOSIS — K5900 Constipation, unspecified: Secondary | ICD-10-CM

## 2012-10-12 DIAGNOSIS — Z5181 Encounter for therapeutic drug level monitoring: Secondary | ICD-10-CM | POA: Diagnosis not present

## 2012-10-12 DIAGNOSIS — E7800 Pure hypercholesterolemia, unspecified: Secondary | ICD-10-CM

## 2012-10-12 DIAGNOSIS — J309 Allergic rhinitis, unspecified: Secondary | ICD-10-CM

## 2012-10-12 DIAGNOSIS — IMO0001 Reserved for inherently not codable concepts without codable children: Secondary | ICD-10-CM | POA: Diagnosis not present

## 2012-10-12 DIAGNOSIS — R269 Unspecified abnormalities of gait and mobility: Secondary | ICD-10-CM | POA: Diagnosis not present

## 2012-10-12 DIAGNOSIS — Z0000 Encounter for general adult medical examination without abnormal findings: Secondary | ICD-10-CM | POA: Diagnosis not present

## 2012-10-12 LAB — HM DIABETES FOOT EXAM

## 2012-10-12 NOTE — Patient Instructions (Signed)
Continue current dose, check in 2 weeks  

## 2012-10-12 NOTE — Progress Notes (Signed)
Subjective:    Patient ID: Susan Duffy, female    DOB: June 01, 1946, 66 y.o.   MRN: [id number]  HPI  66 year old female with metastatic colon carcinoma with lung and chest wall metastasis, hx of breast cancer, HTN, DVT ( on coumadin) and DM  Presents for medicare wellness.  I have personally reviewed the Medicare Annual Wellness questionnaire and have noted 1. The patient's medical and social history 2. Their use of alcohol, tobacco or illicit drugs 3. Their current medications and supplements 4. The patient's functional ability including ADL's, fall risks, home safety risks and hearing or visual             impairment. 5. Diet and physical activities 6. Evidence for depression or mood disorders The patients weight, height, BMI and visual acuity have been recorded in the chart I have made referrals, counseling and provided education to the patient based review of the above and I have provided the pt with a written personalized care plan for preventive services.  08/2012 slid and fell.. Broke ankle.  S/P surgery. In wheelchair in office today. Just now being able to put weight on it. Dr. Gigi Gin.  Sinus pain in right forehead, tingles in right ear x 5 days.Some nasal congestion, fullness. Not better or worse. Low grade early on... 99.2 NO SOB. No ST. Using tylenol prn. Hot tea helps. Has leaf allergy. Not on nasal steroid or antihistamine. Does use nasal saline.  Breast and colon cancer followed closely by Dr. Welton Flakes , Dr. Carolynne Edouard surgeon Had chest, abd, pelvis stable except small nodule abdominal soft tissue. Stable in 07/2012  Diabetes: well controlled on no medicaiton  Lab Results  Component Value Date   HGBA1C 5.3 10/05/2012   Using medications without difficulties:  Hypoglycemic episodes:?  Hyperglycemic episodes:?  Feet problems:none, does have checmo neuropathy  Blood Sugars averaging:Not checking  eye exam within last year: yes  One ace I   Elevated Cholesterol:  Almost at goal <70 with CVA history on simvastatin  Lab Results  Component Value Date   CHOL 136 10/05/2012   HDL 27.60* 10/05/2012   LDLCALC 79 10/05/2012   LDLDIRECT 145.4 11/03/2010   TRIG 146.0 10/05/2012   CHOLHDL 5 10/05/2012  Using medications without problems: None  Muscle aches: None  Diet compliance: Good  Exercise: None given ankle Other complaints:   Hypertension: Well controlled on avapro  Using medication without problems or lightheadedness: None Chest pain with exertion: None Edema:None  Short of breath:Stable.Marland Kitchen No changes  Average home BPs: At goal 110/70 Other issues:        Review of Systems  Constitutional: Negative for fever, fatigue and unexpected weight change.  HENT: Positive for ear pain and sinus pressure. Negative for congestion, sore throat, sneezing and trouble swallowing.   Eyes: Negative for pain and itching.  Respiratory: Negative for cough, chest tightness, shortness of breath and wheezing.   Cardiovascular: Negative for chest pain, palpitations and leg swelling.  Gastrointestinal: Positive for constipation. Negative for nausea, abdominal pain, diarrhea and blood in stool.  Genitourinary: Negative for dysuria, hematuria, vaginal discharge, difficulty urinating and menstrual problem.  Skin: Negative for rash.  Neurological: Negative for syncope, weakness, light-headedness, numbness and headaches.  Psychiatric/Behavioral: Negative for confusion and dysphoric mood. The patient is not nervous/anxious.        Objective:   Physical Exam        Assessment & Plan:  The patient's preventative maintenance and recommended screening tests for an annual wellness exam  were reviewed in full today. Brought up to date unless services declined.  Counselled on the importance of diet, exercise, and its role in overall health and mortality. The patient's FH and SH was reviewed, including their home life, tobacco status, and drug and alcohol status.    Vaccines: Due for shingles, has recieved flu at pharm. Mammo: Hx of breast cancer.. Per Dr. Welton Flakes, Due in December. Bone density: Never had.. Will schedule. Colon: history of stage 4 colon cancer... Per GI Dr. Loreta Ave and Dr. Welton Flakes, colonoscopy scheduled for 11/22/2012 PAP/DVE:  DVE yearly not today given ankle injury, pap not indicated, no symptoms.

## 2012-10-12 NOTE — Patient Instructions (Addendum)
When you are able get back to regular exercise. Increasing water. Increasing fiber in diet, consider metamucil. Look into shingles vaccine coverage. When calling to schedule your mammogram.. Also add on bone density. Increase nasal saline 2-3 times a day, mucinex plain, call if not improving for nasal steroid spray trial. Follow up in 6 months.

## 2012-10-13 ENCOUNTER — Other Ambulatory Visit: Payer: Self-pay | Admitting: Family Medicine

## 2012-10-13 DIAGNOSIS — Z853 Personal history of malignant neoplasm of breast: Secondary | ICD-10-CM

## 2012-10-19 ENCOUNTER — Other Ambulatory Visit: Payer: Self-pay | Admitting: Family Medicine

## 2012-10-19 DIAGNOSIS — R269 Unspecified abnormalities of gait and mobility: Secondary | ICD-10-CM | POA: Diagnosis not present

## 2012-10-19 DIAGNOSIS — C7800 Secondary malignant neoplasm of unspecified lung: Secondary | ICD-10-CM | POA: Diagnosis not present

## 2012-10-19 DIAGNOSIS — Z5181 Encounter for therapeutic drug level monitoring: Secondary | ICD-10-CM | POA: Diagnosis not present

## 2012-10-19 DIAGNOSIS — Z7901 Long term (current) use of anticoagulants: Secondary | ICD-10-CM | POA: Diagnosis not present

## 2012-10-19 DIAGNOSIS — IMO0001 Reserved for inherently not codable concepts without codable children: Secondary | ICD-10-CM | POA: Diagnosis not present

## 2012-10-25 ENCOUNTER — Ambulatory Visit (INDEPENDENT_AMBULATORY_CARE_PROVIDER_SITE_OTHER): Payer: Medicare Other | Admitting: Family Medicine

## 2012-10-25 DIAGNOSIS — I82409 Acute embolism and thrombosis of unspecified deep veins of unspecified lower extremity: Secondary | ICD-10-CM

## 2012-10-25 DIAGNOSIS — S82853A Displaced trimalleolar fracture of unspecified lower leg, initial encounter for closed fracture: Secondary | ICD-10-CM | POA: Diagnosis not present

## 2012-10-25 NOTE — Patient Instructions (Signed)
Continue current dose, check in 4 weeks  

## 2012-10-26 ENCOUNTER — Ambulatory Visit: Payer: Medicare Other

## 2012-10-31 DIAGNOSIS — K5900 Constipation, unspecified: Secondary | ICD-10-CM | POA: Insufficient documentation

## 2012-10-31 NOTE — Assessment & Plan Note (Signed)
Well controlled on no medicaiton. 

## 2012-10-31 NOTE — Assessment & Plan Note (Signed)
Congestion and sinus pressure, no sign of infection. Increase nasal saline 2-3 times a day, mucinex plain, call if not improving for nasal steroid spray trial.

## 2012-10-31 NOTE — Assessment & Plan Note (Signed)
When you are able get back to regular exercise. Increasing water. Increasing fiber in diet, consider metamucil.

## 2012-10-31 NOTE — Assessment & Plan Note (Signed)
Almost at goal <70 with CVA history on simvastatin 

## 2012-10-31 NOTE — Assessment & Plan Note (Signed)
Well controlled. Continue current medication.  

## 2012-11-16 ENCOUNTER — Telehealth: Payer: Self-pay | Admitting: Medical Oncology

## 2012-11-16 ENCOUNTER — Other Ambulatory Visit: Payer: Self-pay | Admitting: Oncology

## 2012-11-16 ENCOUNTER — Encounter: Payer: Self-pay | Admitting: Medical Oncology

## 2012-11-16 DIAGNOSIS — C189 Malignant neoplasm of colon, unspecified: Secondary | ICD-10-CM

## 2012-11-16 NOTE — Telephone Encounter (Signed)
S/w the pt and she is aware of her lab appt on 12/07/2012@1 :30pm.

## 2012-11-16 NOTE — Telephone Encounter (Signed)
Fax from [facility] Radiology Department regarding no upcoming labs scheduled before upcoming CT scan for 12/08/12. Will review with MD.

## 2012-11-20 ENCOUNTER — Other Ambulatory Visit: Payer: Self-pay | Admitting: Oncology

## 2012-11-20 ENCOUNTER — Other Ambulatory Visit: Payer: Self-pay | Admitting: Family Medicine

## 2012-11-20 DIAGNOSIS — S8290XD Unspecified fracture of unspecified lower leg, subsequent encounter for closed fracture with routine healing: Secondary | ICD-10-CM | POA: Diagnosis not present

## 2012-11-21 DIAGNOSIS — M25676 Stiffness of unspecified foot, not elsewhere classified: Secondary | ICD-10-CM | POA: Diagnosis not present

## 2012-11-21 DIAGNOSIS — M25673 Stiffness of unspecified ankle, not elsewhere classified: Secondary | ICD-10-CM | POA: Diagnosis not present

## 2012-11-21 DIAGNOSIS — M6281 Muscle weakness (generalized): Secondary | ICD-10-CM | POA: Diagnosis not present

## 2012-11-21 DIAGNOSIS — M25579 Pain in unspecified ankle and joints of unspecified foot: Secondary | ICD-10-CM | POA: Diagnosis not present

## 2012-11-21 DIAGNOSIS — R262 Difficulty in walking, not elsewhere classified: Secondary | ICD-10-CM | POA: Diagnosis not present

## 2012-11-23 ENCOUNTER — Ambulatory Visit (INDEPENDENT_AMBULATORY_CARE_PROVIDER_SITE_OTHER): Payer: Medicare Other | Admitting: General Practice

## 2012-11-23 DIAGNOSIS — Z7901 Long term (current) use of anticoagulants: Secondary | ICD-10-CM | POA: Diagnosis not present

## 2012-11-23 DIAGNOSIS — I82409 Acute embolism and thrombosis of unspecified deep veins of unspecified lower extremity: Secondary | ICD-10-CM | POA: Diagnosis not present

## 2012-11-23 DIAGNOSIS — M6281 Muscle weakness (generalized): Secondary | ICD-10-CM | POA: Diagnosis not present

## 2012-11-23 DIAGNOSIS — M25673 Stiffness of unspecified ankle, not elsewhere classified: Secondary | ICD-10-CM | POA: Diagnosis not present

## 2012-11-23 DIAGNOSIS — R262 Difficulty in walking, not elsewhere classified: Secondary | ICD-10-CM | POA: Diagnosis not present

## 2012-11-23 DIAGNOSIS — M25579 Pain in unspecified ankle and joints of unspecified foot: Secondary | ICD-10-CM | POA: Diagnosis not present

## 2012-11-23 LAB — POCT INR: INR: 1.9

## 2012-11-28 DIAGNOSIS — R262 Difficulty in walking, not elsewhere classified: Secondary | ICD-10-CM | POA: Diagnosis not present

## 2012-11-28 DIAGNOSIS — M25673 Stiffness of unspecified ankle, not elsewhere classified: Secondary | ICD-10-CM | POA: Diagnosis not present

## 2012-11-28 DIAGNOSIS — M6281 Muscle weakness (generalized): Secondary | ICD-10-CM | POA: Diagnosis not present

## 2012-11-28 DIAGNOSIS — M25579 Pain in unspecified ankle and joints of unspecified foot: Secondary | ICD-10-CM | POA: Diagnosis not present

## 2012-11-28 DIAGNOSIS — M25676 Stiffness of unspecified foot, not elsewhere classified: Secondary | ICD-10-CM | POA: Diagnosis not present

## 2012-12-01 ENCOUNTER — Other Ambulatory Visit: Payer: Medicare Other

## 2012-12-01 DIAGNOSIS — M25579 Pain in unspecified ankle and joints of unspecified foot: Secondary | ICD-10-CM | POA: Diagnosis not present

## 2012-12-01 DIAGNOSIS — M25676 Stiffness of unspecified foot, not elsewhere classified: Secondary | ICD-10-CM | POA: Diagnosis not present

## 2012-12-01 DIAGNOSIS — M6281 Muscle weakness (generalized): Secondary | ICD-10-CM | POA: Diagnosis not present

## 2012-12-01 DIAGNOSIS — M25673 Stiffness of unspecified ankle, not elsewhere classified: Secondary | ICD-10-CM | POA: Diagnosis not present

## 2012-12-01 DIAGNOSIS — R262 Difficulty in walking, not elsewhere classified: Secondary | ICD-10-CM | POA: Diagnosis not present

## 2012-12-04 ENCOUNTER — Ambulatory Visit: Payer: Medicare Other

## 2012-12-04 ENCOUNTER — Ambulatory Visit (INDEPENDENT_AMBULATORY_CARE_PROVIDER_SITE_OTHER): Payer: Medicare Other | Admitting: General Practice

## 2012-12-04 DIAGNOSIS — I82409 Acute embolism and thrombosis of unspecified deep veins of unspecified lower extremity: Secondary | ICD-10-CM | POA: Diagnosis not present

## 2012-12-04 DIAGNOSIS — Z7901 Long term (current) use of anticoagulants: Secondary | ICD-10-CM | POA: Diagnosis not present

## 2012-12-04 LAB — POCT INR: INR: 3.4

## 2012-12-05 DIAGNOSIS — M25579 Pain in unspecified ankle and joints of unspecified foot: Secondary | ICD-10-CM | POA: Diagnosis not present

## 2012-12-05 DIAGNOSIS — M25673 Stiffness of unspecified ankle, not elsewhere classified: Secondary | ICD-10-CM | POA: Diagnosis not present

## 2012-12-05 DIAGNOSIS — M6281 Muscle weakness (generalized): Secondary | ICD-10-CM | POA: Diagnosis not present

## 2012-12-05 DIAGNOSIS — R262 Difficulty in walking, not elsewhere classified: Secondary | ICD-10-CM | POA: Diagnosis not present

## 2012-12-07 ENCOUNTER — Other Ambulatory Visit (HOSPITAL_BASED_OUTPATIENT_CLINIC_OR_DEPARTMENT_OTHER): Payer: Medicare Other | Admitting: Lab

## 2012-12-07 ENCOUNTER — Ambulatory Visit: Payer: Medicare Other

## 2012-12-07 VITALS — BP 128/72 | HR 72 | Temp 97.9°F

## 2012-12-07 DIAGNOSIS — R262 Difficulty in walking, not elsewhere classified: Secondary | ICD-10-CM | POA: Diagnosis not present

## 2012-12-07 DIAGNOSIS — C189 Malignant neoplasm of colon, unspecified: Secondary | ICD-10-CM

## 2012-12-07 DIAGNOSIS — M25579 Pain in unspecified ankle and joints of unspecified foot: Secondary | ICD-10-CM | POA: Diagnosis not present

## 2012-12-07 DIAGNOSIS — M25673 Stiffness of unspecified ankle, not elsewhere classified: Secondary | ICD-10-CM | POA: Diagnosis not present

## 2012-12-07 DIAGNOSIS — M6281 Muscle weakness (generalized): Secondary | ICD-10-CM | POA: Diagnosis not present

## 2012-12-07 LAB — CBC WITH DIFFERENTIAL/PLATELET
Basophils Absolute: 0 10*3/uL (ref 0.0–0.1)
EOS%: 2.2 % (ref 0.0–7.0)
HCT: 40.5 % (ref 34.8–46.6)
HGB: 13.7 g/dL (ref 11.6–15.9)
LYMPH%: 19.7 % (ref 14.0–49.7)
MCH: 32.2 pg (ref 25.1–34.0)
MCHC: 33.8 g/dL (ref 31.5–36.0)
MCV: 95.1 fL (ref 79.5–101.0)
MONO%: 9.9 % (ref 0.0–14.0)
NEUT%: 67.9 % (ref 38.4–76.8)

## 2012-12-07 LAB — COMPREHENSIVE METABOLIC PANEL (CC13)
BUN: 20 mg/dL (ref 7.0–26.0)
CO2: 26 mEq/L (ref 22–29)
Creatinine: 0.8 mg/dL (ref 0.6–1.1)
Glucose: 94 mg/dl (ref 70–99)
Total Bilirubin: 0.44 mg/dL (ref 0.20–1.20)
Total Protein: 7.7 g/dL (ref 6.4–8.3)

## 2012-12-07 MED ORDER — SODIUM CHLORIDE 0.9 % IJ SOLN
10.0000 mL | INTRAMUSCULAR | Status: DC | PRN
  Administered 2012-12-07: 10 mL via INTRAVENOUS
  Filled 2012-12-07: qty 10

## 2012-12-07 MED ORDER — HEPARIN SOD (PORK) LOCK FLUSH 100 UNIT/ML IV SOLN
500.0000 [IU] | Freq: Once | INTRAVENOUS | Status: AC
  Administered 2012-12-07: 500 [IU] via INTRAVENOUS
  Filled 2012-12-07: qty 5

## 2012-12-08 ENCOUNTER — Ambulatory Visit (HOSPITAL_COMMUNITY)
Admission: RE | Admit: 2012-12-08 | Discharge: 2012-12-08 | Disposition: A | Discharge status: Home / Self Care | Payer: Medicare Other | Source: Ambulatory Visit | Attending: Oncology | Admitting: Oncology

## 2012-12-08 DIAGNOSIS — C189 Malignant neoplasm of colon, unspecified: Secondary | ICD-10-CM | POA: Insufficient documentation

## 2012-12-08 DIAGNOSIS — N281 Cyst of kidney, acquired: Secondary | ICD-10-CM | POA: Diagnosis not present

## 2012-12-08 DIAGNOSIS — S2249XA Multiple fractures of ribs, unspecified side, initial encounter for closed fracture: Secondary | ICD-10-CM | POA: Insufficient documentation

## 2012-12-08 DIAGNOSIS — M4180 Other forms of scoliosis, site unspecified: Secondary | ICD-10-CM | POA: Insufficient documentation

## 2012-12-08 DIAGNOSIS — X58XXXA Exposure to other specified factors, initial encounter: Secondary | ICD-10-CM | POA: Insufficient documentation

## 2012-12-08 MED ORDER — IOHEXOL 300 MG/ML  SOLN
100.0000 mL | Freq: Once | INTRAMUSCULAR | Status: AC | PRN
  Administered 2012-12-08: 100 mL via INTRAVENOUS

## 2012-12-11 DIAGNOSIS — M25676 Stiffness of unspecified foot, not elsewhere classified: Secondary | ICD-10-CM | POA: Diagnosis not present

## 2012-12-11 DIAGNOSIS — M25579 Pain in unspecified ankle and joints of unspecified foot: Secondary | ICD-10-CM | POA: Diagnosis not present

## 2012-12-11 DIAGNOSIS — M25673 Stiffness of unspecified ankle, not elsewhere classified: Secondary | ICD-10-CM | POA: Diagnosis not present

## 2012-12-11 DIAGNOSIS — R262 Difficulty in walking, not elsewhere classified: Secondary | ICD-10-CM | POA: Diagnosis not present

## 2012-12-11 DIAGNOSIS — M6281 Muscle weakness (generalized): Secondary | ICD-10-CM | POA: Diagnosis not present

## 2012-12-13 DIAGNOSIS — M25579 Pain in unspecified ankle and joints of unspecified foot: Secondary | ICD-10-CM | POA: Diagnosis not present

## 2012-12-13 DIAGNOSIS — M25676 Stiffness of unspecified foot, not elsewhere classified: Secondary | ICD-10-CM | POA: Diagnosis not present

## 2012-12-13 DIAGNOSIS — M6281 Muscle weakness (generalized): Secondary | ICD-10-CM | POA: Diagnosis not present

## 2012-12-13 DIAGNOSIS — M25673 Stiffness of unspecified ankle, not elsewhere classified: Secondary | ICD-10-CM | POA: Diagnosis not present

## 2012-12-13 DIAGNOSIS — R262 Difficulty in walking, not elsewhere classified: Secondary | ICD-10-CM | POA: Diagnosis not present

## 2012-12-18 ENCOUNTER — Encounter: Payer: Self-pay | Admitting: Oncology

## 2012-12-18 ENCOUNTER — Telehealth: Payer: Self-pay | Admitting: *Deleted

## 2012-12-18 ENCOUNTER — Other Ambulatory Visit (HOSPITAL_BASED_OUTPATIENT_CLINIC_OR_DEPARTMENT_OTHER): Payer: Medicare Other | Admitting: Lab

## 2012-12-18 ENCOUNTER — Ambulatory Visit (HOSPITAL_BASED_OUTPATIENT_CLINIC_OR_DEPARTMENT_OTHER): Payer: Medicare Other | Admitting: Oncology

## 2012-12-18 VITALS — BP 116/75 | HR 75 | Temp 98.2°F | Resp 20

## 2012-12-18 DIAGNOSIS — D0590 Unspecified type of carcinoma in situ of unspecified breast: Secondary | ICD-10-CM | POA: Diagnosis not present

## 2012-12-18 DIAGNOSIS — C7800 Secondary malignant neoplasm of unspecified lung: Secondary | ICD-10-CM | POA: Diagnosis not present

## 2012-12-18 DIAGNOSIS — D0510 Intraductal carcinoma in situ of unspecified breast: Secondary | ICD-10-CM

## 2012-12-18 DIAGNOSIS — C189 Malignant neoplasm of colon, unspecified: Secondary | ICD-10-CM

## 2012-12-18 DIAGNOSIS — C50919 Malignant neoplasm of unspecified site of unspecified female breast: Secondary | ICD-10-CM | POA: Diagnosis not present

## 2012-12-18 DIAGNOSIS — S2239XA Fracture of one rib, unspecified side, initial encounter for closed fracture: Secondary | ICD-10-CM

## 2012-12-18 DIAGNOSIS — I82409 Acute embolism and thrombosis of unspecified deep veins of unspecified lower extremity: Secondary | ICD-10-CM

## 2012-12-18 LAB — CBC WITH DIFFERENTIAL/PLATELET
Basophils Absolute: 0 10*3/uL (ref 0.0–0.1)
Eosinophils Absolute: 0.2 10*3/uL (ref 0.0–0.5)
HGB: 13.9 g/dL (ref 11.6–15.9)
MONO#: 0.8 10*3/uL (ref 0.1–0.9)
NEUT#: 5.9 10*3/uL (ref 1.5–6.5)
Platelets: 226 10*3/uL (ref 145–400)
RBC: 4.29 10*6/uL (ref 3.70–5.45)
RDW: 13 % (ref 11.2–14.5)
WBC: 8.3 10*3/uL (ref 3.9–10.3)

## 2012-12-18 LAB — COMPREHENSIVE METABOLIC PANEL (CC13)
Albumin: 3.5 g/dL (ref 3.5–5.0)
BUN: 21 mg/dL (ref 7.0–26.0)
CO2: 25 mEq/L (ref 22–29)
Calcium: 10 mg/dL (ref 8.4–10.4)
Glucose: 109 mg/dl — ABNORMAL HIGH (ref 70–99)
Potassium: 4 mEq/L (ref 3.5–5.1)
Sodium: 138 mEq/L (ref 136–145)
Total Protein: 8.3 g/dL (ref 6.4–8.3)

## 2012-12-18 NOTE — Patient Instructions (Addendum)
Doing well   Chest x-ray in 3 months  I will see you back in 6 months

## 2012-12-18 NOTE — Telephone Encounter (Signed)
Made pt appointment for six months

## 2012-12-18 NOTE — Progress Notes (Signed)
OFFICE PROGRESS NOTE  CC  Susan Nora, MD [address] 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 Thus far she has received 8 cycles of FOLFOX Avastin. Most recently we discontinued it due to side effects and she has been on observation only.  #2 patient also has DCIS of the left breast she underwent a lumpectomy followed by radiation and now is on Aromasin 25 mg daily.  #3 patient previously was on tamoxifen and unfortunate she developed a deep venous thrombosis. She is now on Coumadin to titrate her INR between 2 and 2.5. This is being managed by [facility] at [facility].  CURRENT THERAPY: Aromasin 25 mg daily and observation for colon cancer  INTERVAL HISTORY: Susan Duffy 67 y.o. female returns for Followup visit today.Clinically she is doing well. Her INRs I have reviewed and they have all been therapeutic. Today she is without any pain she denies any headaches double vision blurring of vision nausea vomiting fevers chills abdominal pain no peripheral paresthesias no hematuria hematochezia melena she has no breast tenderness. Patient did break her left ankle she is currently in a cast. Remainder of the 10 point review of systems is negative.  MEDICAL HISTORY: Past Medical History  Diagnosis Date  . Anemia     iron defiency  . Arthritis     osteoarthritis  . Allergy   . TIA (transient ischemic attack) 03/08/2011  . Hypertension   . Cancer 12/25/08    Left DCIS,ERPR+,Metastatic colon     ALLERGIES:  is allergic to amoxicillin and erythromycin.  MEDICATIONS:  Current  Outpatient Prescriptions  Medication Sig Dispense Refill  . acetaminophen (TYLENOL) 500 MG tablet as needed. Take 1-2 by mouth every 6 hours as needed       . aspirin 81 MG chewable tablet Chew 81 mg by mouth daily.      . Cyanocobalamin (VITAMIN B-12) 2000 MCG TBCR Take 1 tablet by mouth daily.        Marland Kitchen exemestane (AROMASIN) 25 MG tablet TAKE 1 TABLET DAILY AFTER A MEAL.  90 tablet  0  . irbesartan (AVAPRO) 150 MG tablet TAKE 1 TABLET BY MOUTH EVERY DAY  90 tablet  1  . simvastatin (ZOCOR) 20 MG tablet TAKE 1 TABLET BY MOUTH EVERY DAY AT BEDTIME  90 tablet  2  . warfarin (COUMADIN) 2.5 MG tablet Take 2.5 mg by mouth. As directed per Heme Onc office. Patient takes 2.5 mg four days per week, and the 5 mg dosage the other 3 days. Per patient as of 02/01/12. System would not allow notation to be made on 5 mg due to prescription attached.      . warfarin (COUMADIN) 5 MG tablet USE AS DIRECTED BY COUMADIN CLINIC  30 tablet  3    SURGICAL HISTORY:  Past Surgical History  Procedure Date  . Colon surgery     colectomy, partial  . Breast biopsy 1-10    fibroma removal, s/p lumpectomy and radiation   . Lobectomy 06-2009    metastatic colon cancer  . Breast lumpectomy 2010    left breast  REVIEW OF SYSTEMS:  Pertinent items are noted in HPI.   PHYSICAL EXAMINATION: General appearance: alert, cooperative and appears stated age Neck: no adenopathy, no carotid bruit, no JVD, supple, symmetrical, trachea midline and thyroid not enlarged, symmetric, no tenderness/mass/nodules Lymph nodes: Cervical, supraclavicular, and axillary nodes normal. Resp: clear to auscultation bilaterally and normal percussion bilaterally Back: symmetric, no curvature. ROM normal. No CVA tenderness. Abdomen: Soft nontender nondistended bowel sounds are present no other splenomegaly. Extremities: No edema clubbing or cyanosis. Neuro: Alert oriented strength is symmetrical in upper and lower extremities otherwise  nonfocal.  ECOG PERFORMANCE STATUS: 0 - Asymptomatic  Blood pressure 116/75, pulse 75, temperature 98.2 F (36.8 C), resp. rate 20.  LABORATORY DATA: Lab Results  Component Value Date   WBC 8.3 12/18/2012   HGB 13.9 12/18/2012   HCT 40.3 12/18/2012   MCV 93.8 12/18/2012   PLT 226 12/18/2012      Chemistry      Component Value Date/Time   NA 138 12/18/2012 0842   NA 138 10/05/2012 0825   NA 142 07/03/2012 1108   K 4.0 12/18/2012 0842   K 3.8 10/05/2012 0825   K 4.0 07/03/2012 1108   CL 106 12/18/2012 0842   CL 104 10/05/2012 0825   CL 98 07/03/2012 1108   CO2 25 12/18/2012 0842   CO2 26 10/05/2012 0825   CO2 27 07/03/2012 1108   BUN 21.0 12/18/2012 0842   BUN 18 10/05/2012 0825   BUN 23* 07/03/2012 1108   CREATININE 0.8 12/18/2012 0842   CREATININE 0.9 10/05/2012 0825   CREATININE 1.1 07/03/2012 1108      Component Value Date/Time   CALCIUM 10.0 12/18/2012 0842   CALCIUM 9.6 10/05/2012 0825   CALCIUM 9.8 07/03/2012 1108   ALKPHOS 132 12/18/2012 0842   ALKPHOS 98 10/05/2012 0825   ALKPHOS 117* 07/03/2012 1108   AST 20 12/18/2012 0842   AST 34 10/05/2012 0825   AST 29 07/03/2012 1108   ALT 16 12/18/2012 0842   ALT 37* 10/05/2012 0825   BILITOT 0.46 12/18/2012 0842   BILITOT 0.6 10/05/2012 0825   BILITOT 0.80 07/03/2012 1108       RADIOGRAPHIC STUDIES:  CT CHEST, ABDOMEN AND PELVIS WITH CONTRAST  Technique: Multidetector CT imaging of the chest, abdomen and  pelvis was performed following the standard protocol during bolus  administration of intravenous contrast.  Contrast: OMNIPAQUE IOHEXOL 300 MG/ML SOLN  Comparison: Multiple priors, most recently a CT of the chest,  abdomen and pelvis dated 03/13/2012.  CT CHEST  Findings:  Mediastinum: Heart size is normal. There is no significant  pericardial fluid, thickening or pericardial calcification. There  is a small hiatal hernia. No pathologically enlarged mediastinal or  hilar lymph nodes. Left-sided subclavian single  lumen Port-A-Cath  with tip terminating at the superior cavoatrial junction.  Lungs/Pleura: Status post left lower lobectomy. Compensatory  hyperexpansion of the left upper lobe, although there continues to  be some mild right to left shift of cardiomediastinal structures.  No suspicious appearing pulmonary nodules or masses are identified.  No consolidative airspace disease. No pleural effusions.  Musculoskeletal: There are no aggressive appearing lytic or blastic  lesions noted in the visualized portions of the skeleton. 12 mm  soft tissue attenuation nodule in the medial aspect of the right  breast is smoothly marginated and unchanged in size compared to  prior examinations, presumably a benign finding such as a  intraparenchymal lymph node or fibroadenoma. A focal area of  architectural distortion in the left breast is unchanged, likely to  represent a site of prior lumpectomy.  IMPRESSION:  1. No suspicious findings to suggest new metastatic disease to the  thorax.  2. Status post left lower lobectomy.  3. Postoperative changes and support apparatus, as above.  CT ABDOMEN AND PELVIS  Findings:  Abdomen/Pelvis: The enhanced appearance of the liver, gallbladder,  pancreas, spleen, bilateral adrenal glands and to the right kidney  is unremarkable. In the anterior aspect of the left kidney there  is a 5.8 x 4.7 cm cyst that has a tiny punctate area of  calcification along the anteromedial margin (unchanged).  Status post partial sigmoidectomy. No abnormal soft tissue mass  around the suture line to suggest local recurrence of disease. No  ascites or pneumoperitoneum and no pathologic distension of bowel.  No definite pathologic lymphadenopathy identified within the  abdomen or pelvis. Appendix is normal in appearance. Uterus,  bilateral ovaries and urinary bladder are unremarkable in  appearance.  Musculoskeletal: There are no aggressive appearing lytic or blastic  lesions noted  in the visualized portions of the skeleton. Slight  interval increase and 1.9 cm low attenuation (negative 35 HU)  lesion in the subcutaneous fat of the right anterior abdominal wall  (image 73 of series 2) is nonspecific, but favored to represent an  area of evolving fat necrosis.  IMPRESSION:  1. No findings to suggest metastatic disease to the abdomen or  pelvis.  2. Status post partial sigmoidectomy.  3. Large cyst in the anterior aspect of the left kidney is  unchanged.  4. Normal appendix.  5. Slight interval increase in size of low attenuation  subcutaneous lesion in the right anterior abdominal wall  subcutaneous fat. Overall, this lesion is most consistent with an  area of fat necrosis, however, continued attention on follow-up  studies is recommended.    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 She is noted to have a abdominal subcutaneous nodule near the umbilicus unclear etiology we will continue to monitor this.  #2 DCIS of the left rest status post lumpectomy followed by radiation and now on Aromasin and tolerating it well.  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   PLAN:  #1 patient will be seen back in 6 month's time with restaging scans.  #2 she will continue to be seen at her primary care physician's office for monitoring of Coumadin levels.  #3 she will continue Aromasin for DCIS.  #4 patient knows to call me with any problems questions or concerns.  All questions were answered. The patient knows to call the clinic with any problems, questions or concerns. We can certainly see the patient much sooner if necessary.  I spent 25 minutes counseling the patient face to face. The total time spent in the  appointment was 30 minutes.    Drue Second, MD Medical/Oncology [facility] [phone] (beeper) [phone] (Office)  12/18/2012, 10:34 AM

## 2012-12-19 DIAGNOSIS — M6281 Muscle weakness (generalized): Secondary | ICD-10-CM | POA: Diagnosis not present

## 2012-12-19 DIAGNOSIS — M25673 Stiffness of unspecified ankle, not elsewhere classified: Secondary | ICD-10-CM | POA: Diagnosis not present

## 2012-12-19 DIAGNOSIS — M25579 Pain in unspecified ankle and joints of unspecified foot: Secondary | ICD-10-CM | POA: Diagnosis not present

## 2012-12-19 DIAGNOSIS — R262 Difficulty in walking, not elsewhere classified: Secondary | ICD-10-CM | POA: Diagnosis not present

## 2012-12-19 DIAGNOSIS — M25676 Stiffness of unspecified foot, not elsewhere classified: Secondary | ICD-10-CM | POA: Diagnosis not present

## 2012-12-21 ENCOUNTER — Ambulatory Visit: Payer: Medicare Other

## 2012-12-21 DIAGNOSIS — M25676 Stiffness of unspecified foot, not elsewhere classified: Secondary | ICD-10-CM | POA: Diagnosis not present

## 2012-12-21 DIAGNOSIS — M25673 Stiffness of unspecified ankle, not elsewhere classified: Secondary | ICD-10-CM | POA: Diagnosis not present

## 2012-12-21 DIAGNOSIS — R262 Difficulty in walking, not elsewhere classified: Secondary | ICD-10-CM | POA: Diagnosis not present

## 2012-12-21 DIAGNOSIS — M6281 Muscle weakness (generalized): Secondary | ICD-10-CM | POA: Diagnosis not present

## 2012-12-21 DIAGNOSIS — M25579 Pain in unspecified ankle and joints of unspecified foot: Secondary | ICD-10-CM | POA: Diagnosis not present

## 2012-12-26 DIAGNOSIS — M6281 Muscle weakness (generalized): Secondary | ICD-10-CM | POA: Diagnosis not present

## 2012-12-26 DIAGNOSIS — M25676 Stiffness of unspecified foot, not elsewhere classified: Secondary | ICD-10-CM | POA: Diagnosis not present

## 2012-12-26 DIAGNOSIS — R262 Difficulty in walking, not elsewhere classified: Secondary | ICD-10-CM | POA: Diagnosis not present

## 2012-12-26 DIAGNOSIS — M25579 Pain in unspecified ankle and joints of unspecified foot: Secondary | ICD-10-CM | POA: Diagnosis not present

## 2012-12-26 DIAGNOSIS — M25673 Stiffness of unspecified ankle, not elsewhere classified: Secondary | ICD-10-CM | POA: Diagnosis not present

## 2012-12-28 ENCOUNTER — Ambulatory Visit (INDEPENDENT_AMBULATORY_CARE_PROVIDER_SITE_OTHER): Payer: Medicare Other | Admitting: General Practice

## 2012-12-28 DIAGNOSIS — Z7901 Long term (current) use of anticoagulants: Secondary | ICD-10-CM

## 2012-12-28 DIAGNOSIS — I82409 Acute embolism and thrombosis of unspecified deep veins of unspecified lower extremity: Secondary | ICD-10-CM | POA: Diagnosis not present

## 2012-12-28 LAB — POCT INR: INR: 3.6

## 2012-12-29 DIAGNOSIS — M25579 Pain in unspecified ankle and joints of unspecified foot: Secondary | ICD-10-CM | POA: Diagnosis not present

## 2012-12-29 DIAGNOSIS — M25676 Stiffness of unspecified foot, not elsewhere classified: Secondary | ICD-10-CM | POA: Diagnosis not present

## 2012-12-29 DIAGNOSIS — R262 Difficulty in walking, not elsewhere classified: Secondary | ICD-10-CM | POA: Diagnosis not present

## 2012-12-29 DIAGNOSIS — M25673 Stiffness of unspecified ankle, not elsewhere classified: Secondary | ICD-10-CM | POA: Diagnosis not present

## 2012-12-29 DIAGNOSIS — M6281 Muscle weakness (generalized): Secondary | ICD-10-CM | POA: Diagnosis not present

## 2013-01-01 DIAGNOSIS — S8290XD Unspecified fracture of unspecified lower leg, subsequent encounter for closed fracture with routine healing: Secondary | ICD-10-CM | POA: Diagnosis not present

## 2013-01-09 ENCOUNTER — Ambulatory Visit (INDEPENDENT_AMBULATORY_CARE_PROVIDER_SITE_OTHER): Payer: Medicare Other | Admitting: General Surgery

## 2013-01-15 ENCOUNTER — Other Ambulatory Visit: Payer: Medicare Other

## 2013-01-15 DIAGNOSIS — M6281 Muscle weakness (generalized): Secondary | ICD-10-CM | POA: Diagnosis not present

## 2013-01-15 DIAGNOSIS — M25579 Pain in unspecified ankle and joints of unspecified foot: Secondary | ICD-10-CM | POA: Diagnosis not present

## 2013-01-15 DIAGNOSIS — M25676 Stiffness of unspecified foot, not elsewhere classified: Secondary | ICD-10-CM | POA: Diagnosis not present

## 2013-01-15 DIAGNOSIS — M25673 Stiffness of unspecified ankle, not elsewhere classified: Secondary | ICD-10-CM | POA: Diagnosis not present

## 2013-01-15 DIAGNOSIS — R262 Difficulty in walking, not elsewhere classified: Secondary | ICD-10-CM | POA: Diagnosis not present

## 2013-01-16 ENCOUNTER — Ambulatory Visit (INDEPENDENT_AMBULATORY_CARE_PROVIDER_SITE_OTHER): Payer: Medicare Other | Admitting: General Surgery

## 2013-01-22 DIAGNOSIS — M25673 Stiffness of unspecified ankle, not elsewhere classified: Secondary | ICD-10-CM | POA: Diagnosis not present

## 2013-01-22 DIAGNOSIS — M25579 Pain in unspecified ankle and joints of unspecified foot: Secondary | ICD-10-CM | POA: Diagnosis not present

## 2013-01-22 DIAGNOSIS — M25676 Stiffness of unspecified foot, not elsewhere classified: Secondary | ICD-10-CM | POA: Diagnosis not present

## 2013-01-22 DIAGNOSIS — M6281 Muscle weakness (generalized): Secondary | ICD-10-CM | POA: Diagnosis not present

## 2013-01-22 DIAGNOSIS — R262 Difficulty in walking, not elsewhere classified: Secondary | ICD-10-CM | POA: Diagnosis not present

## 2013-01-23 ENCOUNTER — Other Ambulatory Visit: Payer: Self-pay | Admitting: Family Medicine

## 2013-01-23 ENCOUNTER — Other Ambulatory Visit: Payer: Self-pay | Admitting: General Practice

## 2013-01-23 MED ORDER — WARFARIN SODIUM 5 MG PO TABS: 5.0000 mg | ORAL_TABLET | Freq: Every day | ORAL | Status: DC

## 2013-01-24 DIAGNOSIS — M6281 Muscle weakness (generalized): Secondary | ICD-10-CM | POA: Diagnosis not present

## 2013-01-24 DIAGNOSIS — M25673 Stiffness of unspecified ankle, not elsewhere classified: Secondary | ICD-10-CM | POA: Diagnosis not present

## 2013-01-24 DIAGNOSIS — M25579 Pain in unspecified ankle and joints of unspecified foot: Secondary | ICD-10-CM | POA: Diagnosis not present

## 2013-01-24 DIAGNOSIS — R262 Difficulty in walking, not elsewhere classified: Secondary | ICD-10-CM | POA: Diagnosis not present

## 2013-01-25 ENCOUNTER — Ambulatory Visit (INDEPENDENT_AMBULATORY_CARE_PROVIDER_SITE_OTHER): Payer: Medicare Other | Admitting: General Practice

## 2013-01-25 DIAGNOSIS — Z9889 Other specified postprocedural states: Secondary | ICD-10-CM | POA: Diagnosis not present

## 2013-01-25 DIAGNOSIS — I82409 Acute embolism and thrombosis of unspecified deep veins of unspecified lower extremity: Secondary | ICD-10-CM | POA: Diagnosis not present

## 2013-01-25 DIAGNOSIS — Z95828 Presence of other vascular implants and grafts: Secondary | ICD-10-CM | POA: Insufficient documentation

## 2013-01-25 LAB — POCT INR: INR: 2.6

## 2013-01-25 NOTE — Patient Instructions (Addendum)
2/21 - Take last dose of coumadin until after procedure. 2/27 - Take 5 mg of coumadin 2/28 - Take 7.5 mg of coumadin 2/29 - Resume normal dosing schedule and return to clinic on March 6th.

## 2013-01-26 ENCOUNTER — Telehealth: Payer: Self-pay | Admitting: General Practice

## 2013-01-26 NOTE — Telephone Encounter (Signed)
Message copied by Garrison Columbus on Fri Jan 26, 2013  9:54 AM ------      Message from: [name] E      Created: Fri Jan 26, 2013  8:18 AM       I agree she does not need to use lovenox with colonoscopy.      ----- Message -----         From: Hannah Beat, MD         Sent: 01/25/2013   2:32 PM           To: Garrison Columbus, RN, Amy Michelle Nasuti, MD            Arline Asp, I think that we are fine without bridging - we just talked about it. But I wanted to let Amy have a chance to weigh in on it since she is in town.                  Hannah Beat, MD      01/25/2013, 2:33 PM             ----- Message -----         From: Garrison Columbus, RN         Sent: 01/25/2013  11:05 AM           To: Hannah Beat, MD            Dr. Patsy Lager,            Pt  Is having colonoscopy on 2/26.  I just wanted to make sure that she doesn't need lovenox..  She has a history of dvt and she has a power port port- a- cath.  Says that she didn't have lovenox for prior procedures.  Just checking to be sure.  Pt of Dr. Ermalene Searing.            Thanks,      Arline Asp             ------

## 2013-01-29 DIAGNOSIS — R262 Difficulty in walking, not elsewhere classified: Secondary | ICD-10-CM | POA: Diagnosis not present

## 2013-01-29 DIAGNOSIS — M6281 Muscle weakness (generalized): Secondary | ICD-10-CM | POA: Diagnosis not present

## 2013-01-29 DIAGNOSIS — M25579 Pain in unspecified ankle and joints of unspecified foot: Secondary | ICD-10-CM | POA: Diagnosis not present

## 2013-01-29 DIAGNOSIS — M25673 Stiffness of unspecified ankle, not elsewhere classified: Secondary | ICD-10-CM | POA: Diagnosis not present

## 2013-01-31 DIAGNOSIS — Z85038 Personal history of other malignant neoplasm of large intestine: Secondary | ICD-10-CM | POA: Diagnosis not present

## 2013-01-31 DIAGNOSIS — D126 Benign neoplasm of colon, unspecified: Secondary | ICD-10-CM | POA: Diagnosis not present

## 2013-01-31 DIAGNOSIS — Z1211 Encounter for screening for malignant neoplasm of colon: Secondary | ICD-10-CM | POA: Diagnosis not present

## 2013-02-01 ENCOUNTER — Telehealth: Payer: Self-pay | Admitting: General Practice

## 2013-02-01 NOTE — Telephone Encounter (Signed)
Patient to re-start coumadin on 3/4 per GI.

## 2013-02-05 DIAGNOSIS — M6281 Muscle weakness (generalized): Secondary | ICD-10-CM | POA: Diagnosis not present

## 2013-02-05 DIAGNOSIS — M25579 Pain in unspecified ankle and joints of unspecified foot: Secondary | ICD-10-CM | POA: Diagnosis not present

## 2013-02-05 DIAGNOSIS — M25673 Stiffness of unspecified ankle, not elsewhere classified: Secondary | ICD-10-CM | POA: Diagnosis not present

## 2013-02-05 DIAGNOSIS — R262 Difficulty in walking, not elsewhere classified: Secondary | ICD-10-CM | POA: Diagnosis not present

## 2013-02-08 ENCOUNTER — Ambulatory Visit: Payer: Medicare Other

## 2013-02-12 ENCOUNTER — Ambulatory Visit (INDEPENDENT_AMBULATORY_CARE_PROVIDER_SITE_OTHER): Payer: Medicare Other | Admitting: General Practice

## 2013-02-12 DIAGNOSIS — Z9889 Other specified postprocedural states: Secondary | ICD-10-CM | POA: Diagnosis not present

## 2013-02-12 DIAGNOSIS — Z95828 Presence of other vascular implants and grafts: Secondary | ICD-10-CM

## 2013-02-12 DIAGNOSIS — I82409 Acute embolism and thrombosis of unspecified deep veins of unspecified lower extremity: Secondary | ICD-10-CM

## 2013-02-12 LAB — POCT INR: INR: 1.8

## 2013-02-13 DIAGNOSIS — M25579 Pain in unspecified ankle and joints of unspecified foot: Secondary | ICD-10-CM | POA: Diagnosis not present

## 2013-02-13 DIAGNOSIS — M6281 Muscle weakness (generalized): Secondary | ICD-10-CM | POA: Diagnosis not present

## 2013-02-13 DIAGNOSIS — M25673 Stiffness of unspecified ankle, not elsewhere classified: Secondary | ICD-10-CM | POA: Diagnosis not present

## 2013-02-14 ENCOUNTER — Ambulatory Visit
Admission: RE | Admit: 2013-02-14 | Discharge: 2013-02-14 | Disposition: A | Discharge status: Home / Self Care | Payer: Medicare Other | Source: Ambulatory Visit | Attending: Family Medicine | Admitting: Family Medicine

## 2013-02-14 DIAGNOSIS — Z853 Personal history of malignant neoplasm of breast: Secondary | ICD-10-CM

## 2013-02-14 DIAGNOSIS — M899 Disorder of bone, unspecified: Secondary | ICD-10-CM | POA: Diagnosis not present

## 2013-02-14 DIAGNOSIS — Z780 Asymptomatic menopausal state: Secondary | ICD-10-CM

## 2013-02-15 ENCOUNTER — Encounter: Payer: Self-pay | Admitting: Family Medicine

## 2013-02-15 ENCOUNTER — Encounter: Payer: Self-pay | Admitting: *Deleted

## 2013-02-15 DIAGNOSIS — M8580 Other specified disorders of bone density and structure, unspecified site: Secondary | ICD-10-CM | POA: Insufficient documentation

## 2013-02-15 DIAGNOSIS — M6281 Muscle weakness (generalized): Secondary | ICD-10-CM | POA: Diagnosis not present

## 2013-02-15 DIAGNOSIS — R262 Difficulty in walking, not elsewhere classified: Secondary | ICD-10-CM | POA: Diagnosis not present

## 2013-02-15 DIAGNOSIS — M25579 Pain in unspecified ankle and joints of unspecified foot: Secondary | ICD-10-CM | POA: Diagnosis not present

## 2013-02-15 DIAGNOSIS — M25673 Stiffness of unspecified ankle, not elsewhere classified: Secondary | ICD-10-CM | POA: Diagnosis not present

## 2013-02-19 ENCOUNTER — Ambulatory Visit (HOSPITAL_BASED_OUTPATIENT_CLINIC_OR_DEPARTMENT_OTHER): Payer: Medicare Other

## 2013-02-19 ENCOUNTER — Ambulatory Visit (HOSPITAL_COMMUNITY)
Admission: RE | Admit: 2013-02-19 | Discharge: 2013-02-19 | Disposition: A | Discharge status: Home / Self Care | Payer: Medicare Other | Source: Ambulatory Visit | Attending: Oncology | Admitting: Oncology

## 2013-02-19 VITALS — BP 150/85 | HR 70 | Temp 97.5°F | Resp 20

## 2013-02-19 DIAGNOSIS — Z452 Encounter for adjustment and management of vascular access device: Secondary | ICD-10-CM

## 2013-02-19 DIAGNOSIS — X58XXXA Exposure to other specified factors, initial encounter: Secondary | ICD-10-CM | POA: Insufficient documentation

## 2013-02-19 DIAGNOSIS — M4120 Other idiopathic scoliosis, site unspecified: Secondary | ICD-10-CM | POA: Insufficient documentation

## 2013-02-19 DIAGNOSIS — S2239XA Fracture of one rib, unspecified side, initial encounter for closed fracture: Secondary | ICD-10-CM

## 2013-02-19 DIAGNOSIS — C7800 Secondary malignant neoplasm of unspecified lung: Secondary | ICD-10-CM | POA: Diagnosis not present

## 2013-02-19 DIAGNOSIS — Z85118 Personal history of other malignant neoplasm of bronchus and lung: Secondary | ICD-10-CM | POA: Insufficient documentation

## 2013-02-19 DIAGNOSIS — S2249XA Multiple fractures of ribs, unspecified side, initial encounter for closed fracture: Secondary | ICD-10-CM | POA: Insufficient documentation

## 2013-02-19 DIAGNOSIS — C189 Malignant neoplasm of colon, unspecified: Secondary | ICD-10-CM

## 2013-02-19 DIAGNOSIS — C801 Malignant (primary) neoplasm, unspecified: Secondary | ICD-10-CM

## 2013-02-19 MED ORDER — SODIUM CHLORIDE 0.9 % IJ SOLN
10.0000 mL | INTRAMUSCULAR | Status: DC | PRN
  Administered 2013-02-19: 10 mL via INTRAVENOUS
  Filled 2013-02-19: qty 10

## 2013-02-19 MED ORDER — HEPARIN SOD (PORK) LOCK FLUSH 100 UNIT/ML IV SOLN
500.0000 [IU] | Freq: Once | INTRAVENOUS | Status: AC
  Administered 2013-02-19: 500 [IU] via INTRAVENOUS
  Filled 2013-02-19: qty 5

## 2013-02-19 NOTE — Patient Instructions (Signed)
Portacath flushed today, be sure to have flushed every 6-8 weeks

## 2013-02-20 ENCOUNTER — Other Ambulatory Visit: Payer: Self-pay | Admitting: Oncology

## 2013-02-28 DIAGNOSIS — M25579 Pain in unspecified ankle and joints of unspecified foot: Secondary | ICD-10-CM | POA: Diagnosis not present

## 2013-03-07 ENCOUNTER — Telehealth (INDEPENDENT_AMBULATORY_CARE_PROVIDER_SITE_OTHER): Payer: Self-pay | Admitting: General Surgery

## 2013-03-07 ENCOUNTER — Ambulatory Visit (INDEPENDENT_AMBULATORY_CARE_PROVIDER_SITE_OTHER): Payer: Medicare Other | Admitting: General Surgery

## 2013-03-07 ENCOUNTER — Encounter (INDEPENDENT_AMBULATORY_CARE_PROVIDER_SITE_OTHER): Payer: Self-pay | Admitting: General Surgery

## 2013-03-07 VITALS — BP 124/86 | HR 87 | Temp 98.3°F | Resp 16 | Ht 63.0 in | Wt 186.8 lb

## 2013-03-07 DIAGNOSIS — C50919 Malignant neoplasm of unspecified site of unspecified female breast: Secondary | ICD-10-CM | POA: Diagnosis not present

## 2013-03-07 NOTE — Telephone Encounter (Signed)
Spoke with patient she is aware of u/s soft tissue  For 03/08/13 at 1:30 at  [employer]

## 2013-03-07 NOTE — Progress Notes (Signed)
Subjective:     Patient ID: Susan Duffy, female   DOB: June 25, 1946, 67 y.o.   MRN: [id number]  HPI The patient is a 67 year old white female who is 67 years status post left breast lumpectomy for DCIS. 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 Since her last visit she had a fall where she broke her left ankle. She may have also fractured ribs she does not remember trauma to that area. Her most recent CT scan showed only the broken ribs. Her most recent mammogram showed no evidence of malignancy. There were some calcifications identified near her scar which were thought to be benign but they are to follow these with reimaging in 6 months. She denies any breast pain or discharge from her nipple.  Review of Systems  Constitutional: Negative.   HENT: Negative.   Eyes: Negative.   Respiratory: Negative.   Cardiovascular: Negative.   Gastrointestinal: Negative.   Endocrine: Negative.   Genitourinary: Negative.   Musculoskeletal: Positive for arthralgias.  Skin: Negative.   Allergic/Immunologic: Negative.   Neurological: Negative.   Hematological: Negative.   Psychiatric/Behavioral: Negative.        Objective:   Physical Exam  Constitutional: She is oriented to person, place, and time. She appears well-developed and well-nourished.  HENT:  Head: Normocephalic and atraumatic.  Eyes: Conjunctivae and EOM are normal. Pupils are equal, round, and reactive to light.  Neck: Normal range of motion. Neck supple.  There may be a left supraclavicular lymph node that is enlarged.  Cardiovascular: Normal rate, regular rhythm and normal heart sounds.   Pulmonary/Chest: Effort normal and breath sounds normal.  There is no palpable mass in either breast. There is no palpable axillary or cervical lymphadenopathy. She may have a palpable enlarged left supraclavicular lymph node  Abdominal: Soft. Bowel sounds are normal. She exhibits no mass. There is no  tenderness.  Musculoskeletal: Normal range of motion.  Lymphadenopathy:    She has no cervical adenopathy.  Neurological: She is alert and oriented to person, place, and time.  Skin: Skin is warm and dry.  Psychiatric: She has a normal mood and affect. Her behavior is normal.       Assessment:     The patient is 67 years status post left breast lumpectomy for DCIS and has been treated for metastatic colon cancer with a left lung lobectomy     Plan:     At this point she will continue to take Aromasin. She will continue to do regular self exams. We will plan to get an ultrasound of the possible lymph node in the left supraclavicular area. If this is negative we will plan to see her back in 6 months

## 2013-03-07 NOTE — Patient Instructions (Signed)
Continue regular self exams Will get u/s of neck

## 2013-03-08 ENCOUNTER — Ambulatory Visit
Admission: RE | Admit: 2013-03-08 | Discharge: 2013-03-08 | Disposition: A | Discharge status: Home / Self Care | Payer: Medicare Other | Source: Ambulatory Visit | Attending: General Surgery | Admitting: General Surgery

## 2013-03-08 DIAGNOSIS — C50919 Malignant neoplasm of unspecified site of unspecified female breast: Secondary | ICD-10-CM

## 2013-03-08 DIAGNOSIS — R930 Abnormal findings on diagnostic imaging of skull and head, not elsewhere classified: Secondary | ICD-10-CM | POA: Diagnosis not present

## 2013-03-09 ENCOUNTER — Telehealth (INDEPENDENT_AMBULATORY_CARE_PROVIDER_SITE_OTHER): Payer: Self-pay

## 2013-03-09 NOTE — Telephone Encounter (Signed)
Called patient with negative results

## 2013-03-12 ENCOUNTER — Ambulatory Visit (INDEPENDENT_AMBULATORY_CARE_PROVIDER_SITE_OTHER): Payer: Medicare Other | Admitting: General Practice

## 2013-03-12 DIAGNOSIS — I82409 Acute embolism and thrombosis of unspecified deep veins of unspecified lower extremity: Secondary | ICD-10-CM

## 2013-03-12 DIAGNOSIS — Z9889 Other specified postprocedural states: Secondary | ICD-10-CM

## 2013-03-12 DIAGNOSIS — Z95828 Presence of other vascular implants and grafts: Secondary | ICD-10-CM

## 2013-04-02 ENCOUNTER — Ambulatory Visit (HOSPITAL_BASED_OUTPATIENT_CLINIC_OR_DEPARTMENT_OTHER): Payer: Medicare Other

## 2013-04-02 VITALS — BP 136/74 | HR 67 | Temp 98.1°F

## 2013-04-02 DIAGNOSIS — C189 Malignant neoplasm of colon, unspecified: Secondary | ICD-10-CM

## 2013-04-02 DIAGNOSIS — Z452 Encounter for adjustment and management of vascular access device: Secondary | ICD-10-CM | POA: Diagnosis not present

## 2013-04-02 MED ORDER — SODIUM CHLORIDE 0.9 % IJ SOLN
10.0000 mL | INTRAMUSCULAR | Status: DC | PRN
  Administered 2013-04-02: 10 mL via INTRAVENOUS
  Filled 2013-04-02: qty 10

## 2013-04-02 MED ORDER — HEPARIN SOD (PORK) LOCK FLUSH 100 UNIT/ML IV SOLN
500.0000 [IU] | Freq: Once | INTRAVENOUS | Status: AC
  Administered 2013-04-02: 500 [IU] via INTRAVENOUS
  Filled 2013-04-02: qty 5

## 2013-04-09 ENCOUNTER — Ambulatory Visit (INDEPENDENT_AMBULATORY_CARE_PROVIDER_SITE_OTHER): Payer: Medicare Other | Admitting: General Practice

## 2013-04-09 DIAGNOSIS — Z9889 Other specified postprocedural states: Secondary | ICD-10-CM

## 2013-04-09 DIAGNOSIS — I82409 Acute embolism and thrombosis of unspecified deep veins of unspecified lower extremity: Secondary | ICD-10-CM | POA: Diagnosis not present

## 2013-04-09 DIAGNOSIS — Z95828 Presence of other vascular implants and grafts: Secondary | ICD-10-CM

## 2013-04-12 ENCOUNTER — Telehealth: Payer: Self-pay | Admitting: Family Medicine

## 2013-04-12 DIAGNOSIS — E7800 Pure hypercholesterolemia, unspecified: Secondary | ICD-10-CM

## 2013-04-12 DIAGNOSIS — E119 Type 2 diabetes mellitus without complications: Secondary | ICD-10-CM

## 2013-04-12 DIAGNOSIS — M8580 Other specified disorders of bone density and structure, unspecified site: Secondary | ICD-10-CM

## 2013-04-12 DIAGNOSIS — D509 Iron deficiency anemia, unspecified: Secondary | ICD-10-CM

## 2013-04-12 DIAGNOSIS — I10 Essential (primary) hypertension: Secondary | ICD-10-CM

## 2013-04-12 NOTE — Telephone Encounter (Signed)
Message copied by Excell Seltzer on Thu Apr 12, 2013  2:12 PM ------      Message from: Baldomero Lamy      Created: Thu Mar 29, 2013 10:39 AM      Regarding: 6 month labs 5/9 Fri       Please order  future f/u labs for pt's upcomming lab appt.      Thanks      Tasha             ------

## 2013-04-13 ENCOUNTER — Other Ambulatory Visit (INDEPENDENT_AMBULATORY_CARE_PROVIDER_SITE_OTHER): Payer: Medicare Other

## 2013-04-13 DIAGNOSIS — I10 Essential (primary) hypertension: Secondary | ICD-10-CM

## 2013-04-13 DIAGNOSIS — E7800 Pure hypercholesterolemia, unspecified: Secondary | ICD-10-CM | POA: Diagnosis not present

## 2013-04-13 DIAGNOSIS — D509 Iron deficiency anemia, unspecified: Secondary | ICD-10-CM

## 2013-04-13 DIAGNOSIS — R7989 Other specified abnormal findings of blood chemistry: Secondary | ICD-10-CM

## 2013-04-13 DIAGNOSIS — M8580 Other specified disorders of bone density and structure, unspecified site: Secondary | ICD-10-CM

## 2013-04-13 DIAGNOSIS — E119 Type 2 diabetes mellitus without complications: Secondary | ICD-10-CM | POA: Diagnosis not present

## 2013-04-13 DIAGNOSIS — M899 Disorder of bone, unspecified: Secondary | ICD-10-CM | POA: Diagnosis not present

## 2013-04-13 DIAGNOSIS — M949 Disorder of cartilage, unspecified: Secondary | ICD-10-CM

## 2013-04-13 LAB — COMPREHENSIVE METABOLIC PANEL
Albumin: 3.6 g/dL (ref 3.5–5.2)
Alkaline Phosphatase: 135 U/L — ABNORMAL HIGH (ref 39–117)
BUN: 22 mg/dL (ref 6–23)
Creatinine, Ser: 0.9 mg/dL (ref 0.4–1.2)
Glucose, Bld: 97 mg/dL (ref 70–99)
Total Bilirubin: 0.6 mg/dL (ref 0.3–1.2)

## 2013-04-13 LAB — LIPID PANEL
Cholesterol: 154 mg/dL (ref 0–200)
Triglycerides: 125 mg/dL (ref 0.0–149.0)
VLDL: 25 mg/dL (ref 0.0–40.0)

## 2013-04-13 LAB — HEMOGLOBIN A1C: Hgb A1c MFr Bld: 5.8 % (ref 4.6–6.5)

## 2013-04-17 ENCOUNTER — Encounter: Payer: Self-pay | Admitting: Family Medicine

## 2013-04-17 ENCOUNTER — Ambulatory Visit (INDEPENDENT_AMBULATORY_CARE_PROVIDER_SITE_OTHER): Payer: Medicare Other | Admitting: Family Medicine

## 2013-04-17 VITALS — BP 118/82 | HR 78 | Temp 98.4°F | Ht 63.0 in | Wt 191.2 lb

## 2013-04-17 DIAGNOSIS — E7800 Pure hypercholesterolemia, unspecified: Secondary | ICD-10-CM

## 2013-04-17 DIAGNOSIS — E559 Vitamin D deficiency, unspecified: Secondary | ICD-10-CM | POA: Insufficient documentation

## 2013-04-17 DIAGNOSIS — I10 Essential (primary) hypertension: Secondary | ICD-10-CM | POA: Diagnosis not present

## 2013-04-17 DIAGNOSIS — E119 Type 2 diabetes mellitus without complications: Secondary | ICD-10-CM

## 2013-04-17 MED ORDER — ERGOCALCIFEROL 1.25 MG (50000 UT) PO CAPS: 50000.0000 [IU] | ORAL_CAPSULE | ORAL | Status: DC

## 2013-04-17 NOTE — Assessment & Plan Note (Signed)
Well controlled. Continue current medication.  

## 2013-04-17 NOTE — Patient Instructions (Addendum)
Start vit D supplement weekly for 12 weeks then change to daily vit D3 OTC 400 IU twice daily. Follow up for medicare CPX with labs prior in 6 months.

## 2013-04-17 NOTE — Assessment & Plan Note (Addendum)
At goal on low dose simvastatin. Encouraged exercise, weight loss, healthy eating habits.

## 2013-04-17 NOTE — Progress Notes (Signed)
Subjective:    Patient ID: Susan Duffy, female    DOB: 1946-01-24, 67 y.o.   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   Diabetes: well controlled on no medicaiton  Lab Results  Component Value Date   HGBA1C 5.8 04/13/2013   Using medications without difficulties:  Hypoglycemic episodes:?  Hyperglycemic episodes:?  Feet problems:none, does have checmo neuropathy  Blood Sugars averaging:Not checking  eye exam within last year: yes  One ace I   Elevated Cholesterol: Almost at goal <70 with CVA history on simvastatin  Lab Results  Component Value Date   CHOL 154 04/13/2013   HDL 32.60* 04/13/2013   LDLCALC 96 04/13/2013   LDLDIRECT 145.4 11/03/2010   TRIG 125.0 04/13/2013   CHOLHDL 5 04/13/2013   Using medications without problems: None  Muscle aches: None  Diet compliance: Good  Exercise: Walking with a cane Other complaints:   Hypertension: Well controlled on avapro  Using medication without problems or lightheadedness: None  Chest pain with exertion: None  Edema: Left ankle where fracture was. Short of breath:Stable.Marland Kitchen No changes  Average home BPs: At goal 110/70  Other issues:         Review of Systems  Constitutional: Negative for fever and fatigue.  HENT: Negative for ear pain.   Eyes: Negative for pain.  Respiratory: Negative for chest tightness and shortness of breath.   Cardiovascular: Negative for chest pain, palpitations and leg swelling.  Gastrointestinal: Negative for abdominal pain.  Genitourinary: Negative for dysuria.       Objective:   Physical Exam  Constitutional: Vital signs are normal. She appears well-developed and well-nourished. She is cooperative.  Non-toxic appearance. She does not appear ill. No distress.  HENT:  Head: Normocephalic.  Right Ear: Hearing, tympanic membrane, external ear and ear canal normal.  Tympanic membrane is not erythematous, not retracted and not bulging.  Left Ear: Hearing, tympanic membrane, external ear and ear canal normal. Tympanic membrane is not erythematous, not retracted and not bulging.  Nose: No mucosal edema or rhinorrhea. Right sinus exhibits no maxillary sinus tenderness and no frontal sinus tenderness. Left sinus exhibits no maxillary sinus tenderness and no frontal sinus tenderness.  Mouth/Throat: Uvula is midline, oropharynx is clear and moist and mucous membranes are normal.  Eyes: Conjunctivae, EOM and lids are normal. Pupils are equal, round, and reactive to light. No foreign bodies found.  Neck: Trachea normal and normal range of motion. Neck supple. Carotid bruit is not present. No mass and no thyromegaly present.  Cardiovascular: Normal rate, regular rhythm, S1 normal, S2 normal, normal heart sounds, intact distal pulses and normal pulses.  Exam reveals no gallop and no friction rub.   No murmur heard. Pulmonary/Chest: Effort normal and breath sounds normal. Not tachypneic. No respiratory distress. She has no decreased breath sounds. She has no wheezes. She has no rhonchi. She has no rales.  Abdominal: Soft. Normal appearance and bowel sounds are normal. There is no tenderness.  Genitourinary: No vaginal discharge found.  Lymphadenopathy:       Head (right side): No submental, no submandibular and no tonsillar adenopathy present.       Head (left side): No submental, no submandibular and no tonsillar adenopathy present.    She has no cervical adenopathy.  Neurological: She is alert.  Skin: Skin is warm, dry and intact. No rash noted.  Psychiatric: Her speech is normal and behavior is normal. Judgment and thought content normal. Her mood appears not anxious. Cognition and memory are normal. She does not exhibit a depressed mood.    Diabetic foot exam: Normal inspection No skin breakdown No calluses  Normal DP pulses Normal sensation to light touch and  monofilament Nails normal       Assessment & Plan:

## 2013-04-17 NOTE — Assessment & Plan Note (Signed)
Will supplement

## 2013-04-17 NOTE — Assessment & Plan Note (Signed)
Well controlled on no med. Diet control.

## 2013-04-20 ENCOUNTER — Ambulatory Visit: Payer: Medicare Other | Admitting: Family Medicine

## 2013-05-07 ENCOUNTER — Ambulatory Visit: Payer: Medicare Other

## 2013-05-10 ENCOUNTER — Telehealth: Payer: Self-pay | Admitting: Oncology

## 2013-05-10 ENCOUNTER — Ambulatory Visit (INDEPENDENT_AMBULATORY_CARE_PROVIDER_SITE_OTHER): Payer: Medicare Other | Admitting: Family Medicine

## 2013-05-10 DIAGNOSIS — Z9889 Other specified postprocedural states: Secondary | ICD-10-CM

## 2013-05-10 DIAGNOSIS — I82409 Acute embolism and thrombosis of unspecified deep veins of unspecified lower extremity: Secondary | ICD-10-CM | POA: Diagnosis not present

## 2013-05-10 DIAGNOSIS — Z95828 Presence of other vascular implants and grafts: Secondary | ICD-10-CM

## 2013-05-10 LAB — POCT INR: INR: 2.1

## 2013-05-10 NOTE — Telephone Encounter (Signed)
, °

## 2013-05-15 ENCOUNTER — Other Ambulatory Visit: Payer: Self-pay | Admitting: *Deleted

## 2013-05-15 ENCOUNTER — Other Ambulatory Visit: Payer: Self-pay | Admitting: Family Medicine

## 2013-05-15 DIAGNOSIS — D0592 Unspecified type of carcinoma in situ of left breast: Secondary | ICD-10-CM

## 2013-05-15 MED ORDER — EXEMESTANE 25 MG PO TABS: ORAL_TABLET | ORAL | Status: DC

## 2013-05-22 ENCOUNTER — Ambulatory Visit (HOSPITAL_BASED_OUTPATIENT_CLINIC_OR_DEPARTMENT_OTHER): Payer: Medicare Other

## 2013-05-22 VITALS — BP 127/82 | HR 75 | Temp 98.5°F

## 2013-05-22 DIAGNOSIS — Z452 Encounter for adjustment and management of vascular access device: Secondary | ICD-10-CM | POA: Diagnosis not present

## 2013-05-22 DIAGNOSIS — C189 Malignant neoplasm of colon, unspecified: Secondary | ICD-10-CM | POA: Diagnosis not present

## 2013-05-22 MED ORDER — SODIUM CHLORIDE 0.9 % IJ SOLN
10.0000 mL | INTRAMUSCULAR | Status: DC | PRN
  Administered 2013-05-22: 10 mL via INTRAVENOUS
  Filled 2013-05-22: qty 10

## 2013-05-22 MED ORDER — HEPARIN SOD (PORK) LOCK FLUSH 100 UNIT/ML IV SOLN
500.0000 [IU] | Freq: Once | INTRAVENOUS | Status: AC
  Administered 2013-05-22: 500 [IU] via INTRAVENOUS
  Filled 2013-05-22: qty 5

## 2013-05-22 NOTE — Patient Instructions (Addendum)
Call MD for problems 

## 2013-06-07 ENCOUNTER — Ambulatory Visit (INDEPENDENT_AMBULATORY_CARE_PROVIDER_SITE_OTHER): Payer: Medicare Other | Admitting: Family Medicine

## 2013-06-07 DIAGNOSIS — Z95828 Presence of other vascular implants and grafts: Secondary | ICD-10-CM

## 2013-06-07 DIAGNOSIS — I82409 Acute embolism and thrombosis of unspecified deep veins of unspecified lower extremity: Secondary | ICD-10-CM | POA: Diagnosis not present

## 2013-06-07 DIAGNOSIS — Z7901 Long term (current) use of anticoagulants: Secondary | ICD-10-CM

## 2013-06-18 ENCOUNTER — Encounter: Payer: Self-pay | Admitting: Oncology

## 2013-06-18 ENCOUNTER — Telehealth: Payer: Self-pay | Admitting: *Deleted

## 2013-06-18 ENCOUNTER — Ambulatory Visit (HOSPITAL_BASED_OUTPATIENT_CLINIC_OR_DEPARTMENT_OTHER): Payer: Medicare Other | Admitting: Oncology

## 2013-06-18 ENCOUNTER — Other Ambulatory Visit (HOSPITAL_BASED_OUTPATIENT_CLINIC_OR_DEPARTMENT_OTHER): Payer: Medicare Other | Admitting: Lab

## 2013-06-18 VITALS — BP 120/79 | HR 76 | Temp 98.9°F | Resp 18

## 2013-06-18 DIAGNOSIS — D0590 Unspecified type of carcinoma in situ of unspecified breast: Secondary | ICD-10-CM

## 2013-06-18 DIAGNOSIS — C189 Malignant neoplasm of colon, unspecified: Secondary | ICD-10-CM

## 2013-06-18 DIAGNOSIS — C50919 Malignant neoplasm of unspecified site of unspecified female breast: Secondary | ICD-10-CM

## 2013-06-18 DIAGNOSIS — D0510 Intraductal carcinoma in situ of unspecified breast: Secondary | ICD-10-CM

## 2013-06-18 DIAGNOSIS — D0511 Intraductal carcinoma in situ of right breast: Secondary | ICD-10-CM

## 2013-06-18 LAB — CBC WITH DIFFERENTIAL/PLATELET
Basophils Absolute: 0 10*3/uL (ref 0.0–0.1)
Eosinophils Absolute: 0.2 10*3/uL (ref 0.0–0.5)
HCT: 40.1 % (ref 34.8–46.6)
HGB: 14 g/dL (ref 11.6–15.9)
MONO#: 0.6 10*3/uL (ref 0.1–0.9)
NEUT#: 5.3 10*3/uL (ref 1.5–6.5)
RDW: 12.5 % (ref 11.2–14.5)
lymph#: 1.3 10*3/uL (ref 0.9–3.3)

## 2013-06-18 LAB — COMPREHENSIVE METABOLIC PANEL (CC13)
Albumin: 3.4 g/dL — ABNORMAL LOW (ref 3.5–5.0)
Alkaline Phosphatase: 148 U/L (ref 40–150)
CO2: 22 mEq/L (ref 22–29)
Calcium: 9.3 mg/dL (ref 8.4–10.4)
Chloride: 109 mEq/L (ref 98–109)
Glucose: 118 mg/dl (ref 70–140)
Potassium: 3.8 mEq/L (ref 3.5–5.1)
Sodium: 141 mEq/L (ref 136–145)
Total Protein: 7.7 g/dL (ref 6.4–8.3)

## 2013-06-18 NOTE — Progress Notes (Deleted)
OFFICE PROGRESS NOTE  CC  Susan Nora, MD [address] 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 Thus far she has received 8 cycles of FOLFOX Avastin. Most recently we discontinued it due to side effects and she has been on observation only.  #2 patient also has DCIS of the left breast she underwent a lumpectomy followed by radiation and now is on Aromasin 25 mg daily.  #3 patient previously was on tamoxifen and unfortunate she developed a deep venous thrombosis. She is now on Coumadin to titrate her INR between 2 and 2.5. This is being managed by [facility].  CURRENT THERAPY: Aromasin 25 mg daily and observation for colon cancer  INTERVAL HISTORY: Susan Duffy 67 y.o. female returns for Followup visit today.Clinically she is doing well. Her INRs I have reviewed and they have all been therapeutic. Today she is without any pain she denies any headaches double vision blurring of vision nausea vomiting fevers chills abdominal pain no peripheral paresthesias no hematuria hematochezia melena she has no breast tenderness. Patient did break her left ankle she is currently in a cast. Remainder of the 10 point review of systems is negative.  MEDICAL HISTORY: Past Medical History  Diagnosis Date  . Anemia     iron defiency  . Arthritis     osteoarthritis  . Allergy   . TIA (transient ischemic attack) 03/08/2011  . Hypertension   . Cancer 12/25/08    Left DCIS,ERPR+,Metastatic colon     ALLERGIES:  is allergic to amoxicillin and erythromycin.  MEDICATIONS:  Current  Outpatient Prescriptions  Medication Sig Dispense Refill  . acetaminophen (TYLENOL) 500 MG tablet as needed. Take 1-2 by mouth every 6 hours as needed       . aspirin 81 MG chewable tablet Chew 81 mg by mouth daily.      . Cyanocobalamin (VITAMIN B-12) 2000 MCG TBCR Take 1 tablet by mouth daily.        . ergocalciferol (VITAMIN D2) 50000 UNITS capsule Take 1 capsule (50,000 Units total) by mouth once a week.  12 capsule  0  . exemestane (AROMASIN) 25 MG tablet TAKE 1 TABLET DAILY AFTER A MEAL.  90 tablet  0  . irbesartan (AVAPRO) 150 MG tablet TAKE 1 TABLET BY MOUTH EVERY DAY  90 tablet  1  . simvastatin (ZOCOR) 20 MG tablet TAKE 1 TABLET BY MOUTH EVERY DAY AT BEDTIME  90 tablet  2  . warfarin (COUMADIN) 5 MG tablet Take 1 tablet (5 mg total) by mouth daily. Take as directed by coumadin clinic.  90 tablet  1   No current facility-administered medications for this visit.    SURGICAL HISTORY:  Past Surgical History  Procedure Laterality Date  . Colon surgery      colectomy, partial  . Breast biopsy  1-10    fibroma removal, s/p lumpectomy and radiation   . Lobectomy  06-2009    metastatic colon cancer  . Breast lumpectomy  2010    left breast  . Broken ankle  Left 08/2012    REVIEW  OF SYSTEMS:  Pertinent items are noted in HPI.   PHYSICAL EXAMINATION: General appearance: alert, cooperative and appears stated age Neck: no adenopathy, no carotid bruit, no JVD, supple, symmetrical, trachea midline and thyroid not enlarged, symmetric, no tenderness/mass/nodules Lymph nodes: Cervical, supraclavicular, and axillary nodes normal. Resp: clear to auscultation bilaterally and normal percussion bilaterally Back: symmetric, no curvature. ROM normal. No CVA tenderness. Abdomen: Soft nontender nondistended bowel sounds are present no other splenomegaly. Extremities: No edema clubbing or cyanosis. Neuro: Alert oriented strength is symmetrical in upper and lower extremities otherwise  nonfocal.  ECOG PERFORMANCE STATUS: 0 - Asymptomatic  Blood pressure 120/79, pulse 76, temperature 98.9 F (37.2 C), temperature source Oral, resp. rate 18.  LABORATORY DATA: Lab Results  Component Value Date   WBC 7.4 06/18/2013   HGB 14.0 06/18/2013   HCT 40.1 06/18/2013   MCV 94.6 06/18/2013   PLT 216 06/18/2013      Chemistry      Component Value Date/Time   NA 141 06/18/2013 0845   NA 139 04/13/2013 0901   NA 142 07/03/2012 1108   K 3.8 06/18/2013 0845   K 3.9 04/13/2013 0901   K 4.0 07/03/2012 1108   CL 107 04/13/2013 0901   CL 106 12/18/2012 0842   CL 98 07/03/2012 1108   CO2 22 06/18/2013 0845   CO2 24 04/13/2013 0901   CO2 27 07/03/2012 1108   BUN 16.1 06/18/2013 0845   BUN 22 04/13/2013 0901   BUN 23* 07/03/2012 1108   CREATININE 0.8 06/18/2013 0845   CREATININE 0.9 04/13/2013 0901   CREATININE 1.1 07/03/2012 1108      Component Value Date/Time   CALCIUM 9.3 06/18/2013 0845   CALCIUM 9.3 04/13/2013 0901   CALCIUM 9.8 07/03/2012 1108   ALKPHOS 148 06/18/2013 0845   ALKPHOS 135* 04/13/2013 0901   ALKPHOS 117* 07/03/2012 1108   AST 21 06/18/2013 0845   AST 27 04/13/2013 0901   AST 29 07/03/2012 1108   ALT 18 06/18/2013 0845   ALT 24 04/13/2013 0901   BILITOT 0.37 06/18/2013 0845   BILITOT 0.6 04/13/2013 0901   BILITOT 0.80 07/03/2012 1108       RADIOGRAPHIC STUDIES:  CT CHEST, ABDOMEN AND PELVIS WITH CONTRAST  Technique: Multidetector CT imaging of the chest, abdomen and  pelvis was performed following the standard protocol during bolus  administration of intravenous contrast.  Contrast: OMNIPAQUE IOHEXOL 300 MG/ML SOLN  Comparison: Multiple priors, most recently a CT of the chest,  abdomen and pelvis dated 03/13/2012.  CT CHEST  Findings:  Mediastinum: Heart size is normal. There is no significant  pericardial fluid, thickening or pericardial calcification. There  is a small hiatal hernia. No pathologically enlarged mediastinal or  hilar lymph nodes. Left-sided subclavian single  lumen Port-A-Cath  with tip terminating at the superior cavoatrial junction.  Lungs/Pleura: Status post left lower lobectomy. Compensatory  hyperexpansion of the left upper lobe, although there continues to  be some mild right to left shift of cardiomediastinal structures.  No suspicious appearing pulmonary nodules or masses are identified.  No consolidative airspace disease. No pleural effusions.  Musculoskeletal: There are no aggressive appearing lytic or blastic  lesions noted in the visualized portions of the skeleton. 12 mm  soft tissue attenuation nodule in the medial aspect of the right  breast is smoothly marginated and unchanged in size compared to  prior examinations, presumably a benign finding such as a  intraparenchymal lymph node or fibroadenoma. A focal area  of  architectural distortion in the left breast is unchanged, likely to  represent a site of prior lumpectomy.  IMPRESSION:  1. No suspicious findings to suggest new metastatic disease to the  thorax.  2. Status post left lower lobectomy.  3. Postoperative changes and support apparatus, as above.  CT ABDOMEN AND PELVIS  Findings:  Abdomen/Pelvis: The enhanced appearance of the liver, gallbladder,  pancreas, spleen, bilateral adrenal glands and to the right kidney  is unremarkable. In the anterior aspect of the left kidney there  is a 5.8 x 4.7 cm cyst that has a tiny punctate area of  calcification along the anteromedial margin (unchanged).  Status post partial sigmoidectomy. No abnormal soft tissue mass  around the suture line to suggest local recurrence of disease. No  ascites or pneumoperitoneum and no pathologic distension of bowel.  No definite pathologic lymphadenopathy identified within the  abdomen or pelvis. Appendix is normal in appearance. Uterus,  bilateral ovaries and urinary bladder are unremarkable in  appearance.  Musculoskeletal: There are no aggressive appearing lytic or blastic  lesions noted  in the visualized portions of the skeleton. Slight  interval increase and 1.9 cm low attenuation (negative 35 HU)  lesion in the subcutaneous fat of the right anterior abdominal wall  (image 73 of series 2) is nonspecific, but favored to represent an  area of evolving fat necrosis.  IMPRESSION:  1. No findings to suggest metastatic disease to the abdomen or  pelvis.  2. Status post partial sigmoidectomy.  3. Large cyst in the anterior aspect of the left kidney is  unchanged.  4. Normal appendix.  5. Slight interval increase in size of low attenuation  subcutaneous lesion in the right anterior abdominal wall  subcutaneous fat. Overall, this lesion is most consistent with an  area of fat necrosis, however, continued attention on follow-up  studies is recommended.    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 She is noted to have a abdominal subcutaneous nodule near the umbilicus unclear etiology we will continue to monitor this.  #2 DCIS of the left rest status post lumpectomy followed by radiation and now on Aromasin and tolerating it well.  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   PLAN:  #1 patient will be seen back in 6 month's time with restaging scans.  #2 she will continue to be seen at her primary care physician's office for monitoring of Coumadin levels.  #3 she will continue Aromasin for DCIS.  #4 patient knows to call me with any problems questions or concerns.  All questions were answered. The patient knows to call the clinic with any problems, questions or concerns. We can certainly see the patient much sooner if necessary.  I spent 25 minutes counseling the patient face to face. The total time spent in the  appointment was 30 minutes.    Drue Second, MD Medical/Oncology [facility] [phone] (beeper) [phone] (Office)  06/18/2013, 9:25 AM

## 2013-06-18 NOTE — Patient Instructions (Addendum)
Doing well no clinical evidence of recurrent cancer  I will continue to see you back in 6 months

## 2013-06-18 NOTE — Telephone Encounter (Signed)
appts made and printed...td 

## 2013-06-18 NOTE — Progress Notes (Signed)
OFFICE PROGRESS NOTE  CC  [name], MD [address] 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 Thus far she has received 8 cycles of FOLFOX Avastin. Most recently we discontinued it due to side effects and she has been on observation only.  #2 patient also has DCIS of the left breast she underwent a lumpectomy followed by radiation and now is on Aromasin 25 mg daily begun in spring 2010  #3 patient previously was on tamoxifen and unfortunate she developed a deep venous thrombosis. She is now on Coumadin to titrate her INR between 2 and 2.5. This is being managed by [facility] at [facility]  CURRENT THERAPY: Aromasin 25 mg daily and observation for colon cancer  INTERVAL HISTORY: Susan Duffy 67 y.o. female returns for Followup visit today.Overall she is doing well. She is still having pain and difficulty due to the left foot fracture. No fevers or chills, no n/v/d or constipation. No abdominal pain or SOB, no bleeding Remainder of the 10 point ROS is negative.  MEDICAL HISTORY: Past Medical History  Diagnosis Date  . Anemia     iron defiency  . Arthritis     osteoarthritis  . Allergy   . TIA (transient ischemic attack) 03/08/2011  . Hypertension   . Cancer 12/25/08    Left DCIS,ERPR+,Metastatic colon     ALLERGIES:  is allergic to amoxicillin and erythromycin.  MEDICATIONS:  Current Outpatient Prescriptions  Medication Sig Dispense Refill  . acetaminophen (TYLENOL) 500 MG tablet as needed. Take 1-2 by mouth every 6 hours as needed       . aspirin 81 MG chewable  tablet Chew 81 mg by mouth daily.      . Cyanocobalamin (VITAMIN B-12) 2000 MCG TBCR Take 1 tablet by mouth daily.        . ergocalciferol (VITAMIN D2) 50000 UNITS capsule Take 1 capsule (50,000 Units total) by mouth once a week.  12 capsule  0  . exemestane (AROMASIN) 25 MG tablet TAKE 1 TABLET DAILY AFTER A MEAL.  90 tablet  0  . irbesartan (AVAPRO) 150 MG tablet TAKE 1 TABLET BY MOUTH EVERY DAY  90 tablet  1  . simvastatin (ZOCOR) 20 MG tablet TAKE 1 TABLET BY MOUTH EVERY DAY AT BEDTIME  90 tablet  2  . warfarin (COUMADIN) 5 MG tablet Take 1 tablet (5 mg total) by mouth daily. Take as directed by coumadin clinic.  90 tablet  1   No current facility-administered medications for this visit.    SURGICAL HISTORY:  Past Surgical History  Procedure Laterality Date  . Colon surgery      colectomy, partial  . Breast biopsy  1-10    fibroma removal, s/p lumpectomy and radiation   . Lobectomy  06-2009    metastatic colon cancer  . Breast lumpectomy  2010    left breast  . Broken ankle  Left 08/2012    REVIEW OF SYSTEMS:  Pertinent items are noted in HPI.   PHYSICAL EXAMINATION: General appearance: alert, cooperative and appears stated age Neck: no adenopathy, no carotid  bruit, no JVD, supple, symmetrical, trachea midline and thyroid not enlarged, symmetric, no tenderness/mass/nodules Lymph nodes: Cervical, supraclavicular, and axillary nodes normal. Resp: clear to auscultation bilaterally and normal percussion bilaterally Back: symmetric, no curvature. ROM normal. No CVA tenderness. Abdomen: Soft nontender nondistended bowel sounds are present no other splenomegaly. Extremities: No edema clubbing or cyanosis. Neuro: Alert oriented strength is symmetrical in upper and lower extremities otherwise nonfocal.  ECOG PERFORMANCE STATUS: 0 - Asymptomatic  Blood pressure 120/79, pulse 76, temperature 98.9 F (37.2 C), temperature source Oral, resp. rate 18.  LABORATORY DATA: Lab Results   Component Value Date   WBC 7.4 06/18/2013   HGB 14.0 06/18/2013   HCT 40.1 06/18/2013   MCV 94.6 06/18/2013   PLT 216 06/18/2013      Chemistry      Component Value Date/Time   NA 141 06/18/2013 0845   NA 139 04/13/2013 0901   NA 142 07/03/2012 1108   K 3.8 06/18/2013 0845   K 3.9 04/13/2013 0901   K 4.0 07/03/2012 1108   CL 107 04/13/2013 0901   CL 106 12/18/2012 0842   CL 98 07/03/2012 1108   CO2 22 06/18/2013 0845   CO2 24 04/13/2013 0901   CO2 27 07/03/2012 1108   BUN 16.1 06/18/2013 0845   BUN 22 04/13/2013 0901   BUN 23* 07/03/2012 1108   CREATININE 0.8 06/18/2013 0845   CREATININE 0.9 04/13/2013 0901   CREATININE 1.1 07/03/2012 1108      Component Value Date/Time   CALCIUM 9.3 06/18/2013 0845   CALCIUM 9.3 04/13/2013 0901   CALCIUM 9.8 07/03/2012 1108   ALKPHOS 148 06/18/2013 0845   ALKPHOS 135* 04/13/2013 0901   ALKPHOS 117* 07/03/2012 1108   AST 21 06/18/2013 0845   AST 27 04/13/2013 0901   AST 29 07/03/2012 1108   ALT 18 06/18/2013 0845   ALT 24 04/13/2013 0901   BILITOT 0.37 06/18/2013 0845   BILITOT 0.6 04/13/2013 0901   BILITOT 0.80 07/03/2012 1108       RADIOGRAPHIC STUDIES:  CT CHEST, ABDOMEN AND PELVIS WITH CONTRAST  Technique: Multidetector CT imaging of the chest, abdomen and  pelvis was performed following the standard protocol during bolus  administration of intravenous contrast.  Contrast: OMNIPAQUE IOHEXOL 300 MG/ML SOLN  Comparison: Multiple priors, most recently a CT of the chest,  abdomen and pelvis dated 03/13/2012.  CT CHEST  Findings:  Mediastinum: Heart size is normal. There is no significant  pericardial fluid, thickening or pericardial calcification. There  is a small hiatal hernia. No pathologically enlarged mediastinal or  hilar lymph nodes. Left-sided subclavian single lumen Port-A-Cath  with tip terminating at the superior cavoatrial junction.  Lungs/Pleura: Status post left lower lobectomy. Compensatory  hyperexpansion of the left upper lobe, although  there continues to  be some mild right to left shift of cardiomediastinal structures.  No suspicious appearing pulmonary nodules or masses are identified.  No consolidative airspace disease. No pleural effusions.  Musculoskeletal: There are no aggressive appearing lytic or blastic  lesions noted in the visualized portions of the skeleton. 12 mm  soft tissue attenuation nodule in the medial aspect of the right  breast is smoothly marginated and unchanged in size compared to  prior examinations, presumably a benign finding such as a  intraparenchymal lymph node or fibroadenoma. A focal area of  architectural distortion in the left breast is unchanged, likely to  represent a site of prior lumpectomy.  IMPRESSION:  1. No suspicious findings  to suggest new metastatic disease to the  thorax.  2. Status post left lower lobectomy.  3. Postoperative changes and support apparatus, as above.  CT ABDOMEN AND PELVIS  Findings:  Abdomen/Pelvis: The enhanced appearance of the liver, gallbladder,  pancreas, spleen, bilateral adrenal glands and to the right kidney  is unremarkable. In the anterior aspect of the left kidney there  is a 5.8 x 4.7 cm cyst that has a tiny punctate area of  calcification along the anteromedial margin (unchanged).  Status post partial sigmoidectomy. No abnormal soft tissue mass  around the suture line to suggest local recurrence of disease. No  ascites or pneumoperitoneum and no pathologic distension of bowel.  No definite pathologic lymphadenopathy identified within the  abdomen or pelvis. Appendix is normal in appearance. Uterus,  bilateral ovaries and urinary bladder are unremarkable in  appearance.  Musculoskeletal: There are no aggressive appearing lytic or blastic  lesions noted in the visualized portions of the skeleton. Slight  interval increase and 1.9 cm low attenuation (negative 35 HU)  lesion in the subcutaneous fat of the right anterior abdominal wall   (image 73 of series 2) is nonspecific, but favored to represent an  area of evolving fat necrosis.  IMPRESSION:  1. No findings to suggest metastatic disease to the abdomen or  pelvis.  2. Status post partial sigmoidectomy.  3. Large cyst in the anterior aspect of the left kidney is  unchanged.  4. Normal appendix.  5. Slight interval increase in size of low attenuation  subcutaneous lesion in the right anterior abdominal wall  subcutaneous fat. Overall, this lesion is most consistent with an  area of fat necrosis, however, continued attention on follow-up  studies is recommended.  Mammogram: Clinical Data: Personal history of left breast cancer status post  lumpectomy 2010  DIGITAL DIAGNOSTIC BILATERAL MAMMOGRAM WITH CAD  Comparison: December 01, 2011, November 26, 2010, November 24, 2009  Findings:  ACR Breast Density Category scattered fibroglandular tissue  CC and MLO views of bilateral breasts, spot tangential view of left  breast, spot magnification CC and lateral views of the left breast  are submitted. There are stable asymmetries within bilateral  breasts. Within the postsurgical scar left breast, there is  probable dystrophic calcifications.  Mammographic images were processed with CAD.  IMPRESSION:  Probable benign findings.  RECOMMENDATION:  84-month follow-up mammogram for calcifications in surgical scar  left breast.  I have discussed the findings and recommendations with the patient.  Results were also provided in writing at the conclusion of the  visit.  BI-RADS CATEGORY 3: Probably benign finding(s) - short interval  follow-up suggested.  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 She is noted to have a abdominal subcutaneous nodule near the  umbilicus unclear etiology we will continue to monitor this. Plan to do surveillance scans in 1 year or sooner if need arises  #2 DCIS of the left rest status post lumpectomy followed by radiation and now on Aromasin and tolerating it well.  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  #4 recent mammogram with calcifications and she she will have a repeat mammogram in 6 months   PLAN:   #1 patient will be seen back in 6 month's time with restaging scans.  #2 she will  continue to be seen at her primary care physician's office for monitoring of Coumadin levels.  #3 she will continue Aromasin for DCIS.  #4 patient knows to call me with any problems questions or concerns.  All questions were answered. The patient knows to call the clinic with any problems, questions or concerns. We can certainly see the patient much sooner if necessary.  I spent 25 minutes counseling the patient face to face. The total time spent in the appointment was 30 minutes.    Drue Second, MD Medical/Oncology [facility] [phone] (beeper) [phone] (Office)  06/18/2013, 9:27 AM

## 2013-07-05 ENCOUNTER — Ambulatory Visit (INDEPENDENT_AMBULATORY_CARE_PROVIDER_SITE_OTHER): Payer: Medicare Other | Admitting: Family Medicine

## 2013-07-05 DIAGNOSIS — I82409 Acute embolism and thrombosis of unspecified deep veins of unspecified lower extremity: Secondary | ICD-10-CM

## 2013-07-05 DIAGNOSIS — Z95828 Presence of other vascular implants and grafts: Secondary | ICD-10-CM

## 2013-07-05 DIAGNOSIS — Z9889 Other specified postprocedural states: Secondary | ICD-10-CM

## 2013-07-05 LAB — POCT INR: INR: 2.6

## 2013-07-17 ENCOUNTER — Ambulatory Visit (HOSPITAL_BASED_OUTPATIENT_CLINIC_OR_DEPARTMENT_OTHER): Payer: Medicare Other

## 2013-07-17 VITALS — BP 151/91 | HR 77 | Temp 98.2°F

## 2013-07-17 DIAGNOSIS — C189 Malignant neoplasm of colon, unspecified: Secondary | ICD-10-CM | POA: Diagnosis not present

## 2013-07-17 DIAGNOSIS — Z452 Encounter for adjustment and management of vascular access device: Secondary | ICD-10-CM

## 2013-07-17 MED ORDER — SODIUM CHLORIDE 0.9 % IJ SOLN
10.0000 mL | INTRAMUSCULAR | Status: DC | PRN
  Administered 2013-07-17: 10 mL via INTRAVENOUS
  Filled 2013-07-17: qty 10

## 2013-07-17 MED ORDER — HEPARIN SOD (PORK) LOCK FLUSH 100 UNIT/ML IV SOLN
500.0000 [IU] | Freq: Once | INTRAVENOUS | Status: AC
  Administered 2013-07-17: 500 [IU] via INTRAVENOUS
  Filled 2013-07-17: qty 5

## 2013-07-18 ENCOUNTER — Other Ambulatory Visit: Payer: Self-pay | Admitting: Family Medicine

## 2013-07-18 DIAGNOSIS — R921 Mammographic calcification found on diagnostic imaging of breast: Secondary | ICD-10-CM

## 2013-07-26 ENCOUNTER — Other Ambulatory Visit: Payer: Self-pay | Admitting: Family Medicine

## 2013-08-13 ENCOUNTER — Other Ambulatory Visit: Payer: Self-pay | Admitting: *Deleted

## 2013-08-13 DIAGNOSIS — D0592 Unspecified type of carcinoma in situ of left breast: Secondary | ICD-10-CM

## 2013-08-13 MED ORDER — EXEMESTANE 25 MG PO TABS: ORAL_TABLET | ORAL | Status: DC

## 2013-08-16 ENCOUNTER — Other Ambulatory Visit: Payer: Self-pay | Admitting: Family Medicine

## 2013-08-16 ENCOUNTER — Ambulatory Visit (INDEPENDENT_AMBULATORY_CARE_PROVIDER_SITE_OTHER): Payer: Medicare Other | Admitting: Family Medicine

## 2013-08-16 DIAGNOSIS — Z9889 Other specified postprocedural states: Secondary | ICD-10-CM

## 2013-08-16 DIAGNOSIS — Z95828 Presence of other vascular implants and grafts: Secondary | ICD-10-CM

## 2013-08-16 DIAGNOSIS — I82409 Acute embolism and thrombosis of unspecified deep veins of unspecified lower extremity: Secondary | ICD-10-CM | POA: Diagnosis not present

## 2013-08-17 ENCOUNTER — Other Ambulatory Visit: Payer: Self-pay | Admitting: Family Medicine

## 2013-08-17 ENCOUNTER — Ambulatory Visit
Admission: RE | Admit: 2013-08-17 | Discharge: 2013-08-17 | Disposition: A | Discharge status: Home / Self Care | Payer: Medicare Other | Source: Ambulatory Visit | Attending: Family Medicine | Admitting: Family Medicine

## 2013-08-17 DIAGNOSIS — R921 Mammographic calcification found on diagnostic imaging of breast: Secondary | ICD-10-CM

## 2013-08-17 DIAGNOSIS — R928 Other abnormal and inconclusive findings on diagnostic imaging of breast: Secondary | ICD-10-CM | POA: Diagnosis not present

## 2013-08-31 DIAGNOSIS — Z23 Encounter for immunization: Secondary | ICD-10-CM | POA: Diagnosis not present

## 2013-09-11 ENCOUNTER — Ambulatory Visit (HOSPITAL_BASED_OUTPATIENT_CLINIC_OR_DEPARTMENT_OTHER): Payer: Medicare Other

## 2013-09-11 VITALS — BP 151/94 | HR 77 | Temp 98.7°F

## 2013-09-11 DIAGNOSIS — Z452 Encounter for adjustment and management of vascular access device: Secondary | ICD-10-CM | POA: Diagnosis not present

## 2013-09-11 DIAGNOSIS — C189 Malignant neoplasm of colon, unspecified: Secondary | ICD-10-CM

## 2013-09-11 MED ORDER — HEPARIN SOD (PORK) LOCK FLUSH 100 UNIT/ML IV SOLN
500.0000 [IU] | Freq: Once | INTRAVENOUS | Status: AC
  Administered 2013-09-11: 500 [IU] via INTRAVENOUS
  Filled 2013-09-11: qty 5

## 2013-09-11 MED ORDER — SODIUM CHLORIDE 0.9 % IJ SOLN
10.0000 mL | INTRAMUSCULAR | Status: DC | PRN
  Administered 2013-09-11: 10 mL via INTRAVENOUS
  Filled 2013-09-11: qty 10

## 2013-09-11 NOTE — Patient Instructions (Addendum)
Implanted Port Instructions  An implanted port is a central line that has a round shape and is placed under the skin. It is used for long-term IV (intravenous) access for:  · Medicine.  · Fluids.  · Liquid nutrition, such as TPN (total parenteral nutrition).  · Blood samples.  Ports can be placed:  · In the chest area just below the collarbone (this is the most common place.)  · In the arms.  · In the belly (abdomen) area.  · In the legs.  PARTS OF THE PORT  A port has 2 main parts:  · The reservoir. The reservoir is round, disc-shaped, and will be a small, raised area under your skin.  · The reservoir is the part where a needle is inserted (accessed) to either give medicines or to draw blood.  · The catheter. The catheter is a long, slender tube that extends from the reservoir. The catheter is placed into a large vein.  · Medicine that is inserted into the reservoir goes into the catheter and then into the vein.  INSERTION OF THE PORT  · The port is surgically placed in either an operating room or in a procedural area (interventional radiology).  · Medicine may be given to help you relax during the procedure.  · The skin where the port will be inserted is numbed (local anesthetic).  · 1 or 2 small cuts (incisions) will be made in the skin to insert the port.  · The port can be used after it has been inserted.  INCISION SITE CARE  · The incision site may have small adhesive strips on it. This helps keep the incision site closed. Sometimes, no adhesive strips are placed. Instead of adhesive strips, a special kind of surgical glue is used to keep the incision closed.  · If adhesive strips were placed on the incision sites, do not take them off. They will fall off on their own.  · The incision site may be sore for 1 to 2 days. Pain medicine can help.  · Do not get the incision site wet. Bathe or shower as directed by your caregiver.  · The incision site should heal in 5 to 7 days. A small scar may form after the  incision has healed.  ACCESSING THE PORT  Special steps must be taken to access the port:  · Before the port is accessed, a numbing cream can be placed on the skin. This helps numb the skin over the port site.  · A sterile technique is used to access the port.  · The port is accessed with a needle. Only "non-coring" port needles should be used to access the port. Once the port is accessed, a blood return should be checked. This helps ensure the port is in the vein and is not clogged (clotted).  · If your caregiver believes your port should remain accessed, a clear (transparent) bandage will be placed over the needle site. The bandage and needle will need to be changed every week or as directed by your caregiver.  · Keep the bandage covering the needle clean and dry. Do not get it wet. Follow your caregiver's instructions on how to take a shower or bath when the port is accessed.  · If your port does not need to stay accessed, no bandage is needed over the port.  FLUSHING THE PORT  Flushing the port keeps it from getting clogged. How often the port is flushed depends on:  · If a   constant infusion is running. If a constant infusion is running, the port may not need to be flushed.  · If intermittent medicines are given.  · If the port is not being used.  For intermittent medicines:  · The port will need to be flushed:  · After medicines have been given.  · After blood has been drawn.  · As part of routine maintenance.  · A port is normally flushed with:  · Normal saline.  · Heparin.  · Follow your caregiver's advice on how often, how much, and the type of flush to use on your port.  IMPORTANT PORT INFORMATION  · Tell your caregiver if you are allergic to heparin.  · After your port is placed, you will get a manufacturer's information card. The card has information about your port. Keep this card with you at all times.  · There are many types of ports available. Know what kind of port you have.  · In case of an  emergency, it may be helpful to wear a medical alert bracelet. This can help alert health care workers that you have a port.  · The port can stay in for as long as your caregiver believes it is necessary.  · When it is time for the port to come out, surgery will be done to remove it. The surgery will be similar to how the port was put in.  · If you are in the hospital or clinic:  · Your port will be taken care of and flushed by a nurse.  · If you are at home:  · A home health care nurse may give medicines and take care of the port.  · You or a family member can get special training and directions for giving medicine and taking care of the port at home.  SEEK IMMEDIATE MEDICAL CARE IF:   · Your port does not flush or you are unable to get a blood return.  · New drainage or pus is coming from the incision.  · A bad smell is coming from the incision site.  · You develop swelling or increased redness at the incision site.  · You develop increased swelling or pain at the port site.  · You develop swelling or pain in the surrounding skin near the port.  · You have an oral temperature above 102° F (38.9° C), not controlled by medicine.  MAKE SURE YOU:   · Understand these instructions.  · Will watch your condition.  · Will get help right away if you are not doing well or get worse.  Document Released: 11/22/2005 Document Revised: 02/14/2012 Document Reviewed: 02/13/2009  Exit[employer]® Patient Information ©2014 [employer].

## 2013-09-19 IMAGING — MG MM DIGITAL DIAGNOSTIC BILAT CAD
5 series · 5 of 5 positions shown · non-contrast
Comparison: Previous examinations.

CLINICAL DATA: Status post left lumpectomyand radiation therapy
for breast cancer in 2444.

DIGITAL DIAGNOSTIC BILATERAL MAMMOGRAM WITH CAD

[R CC]
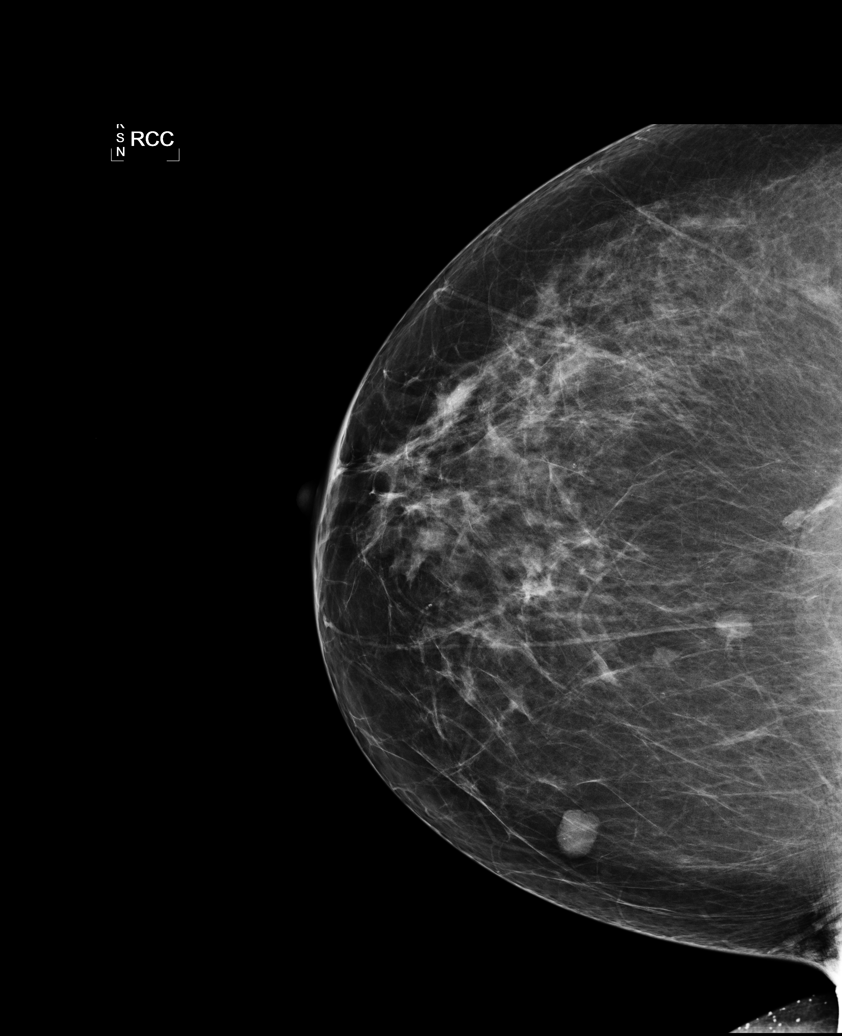

[L CC]
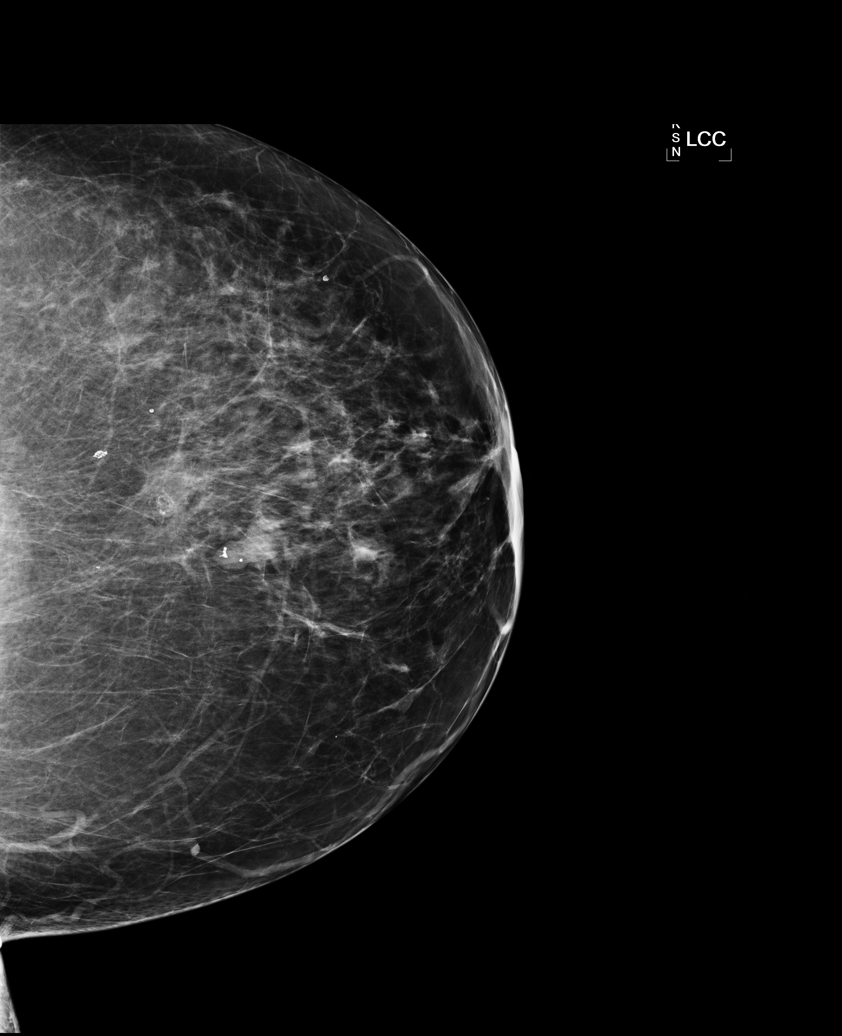

[L MLO]
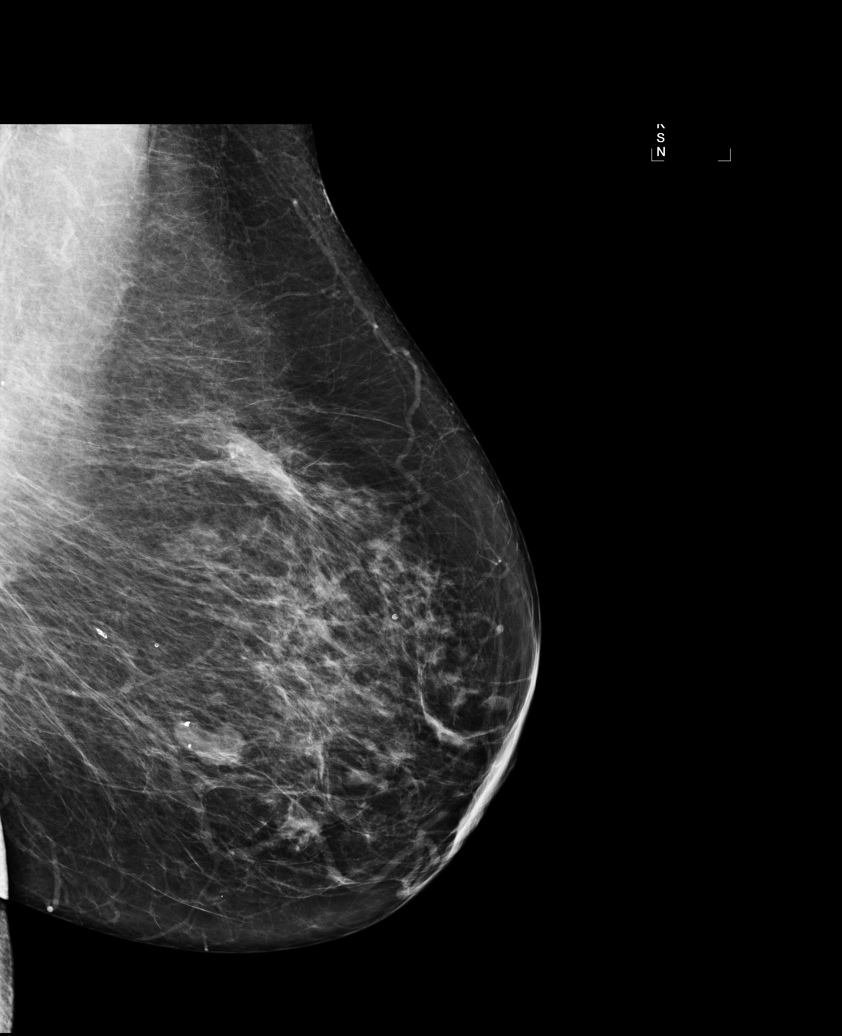

[R MLO]
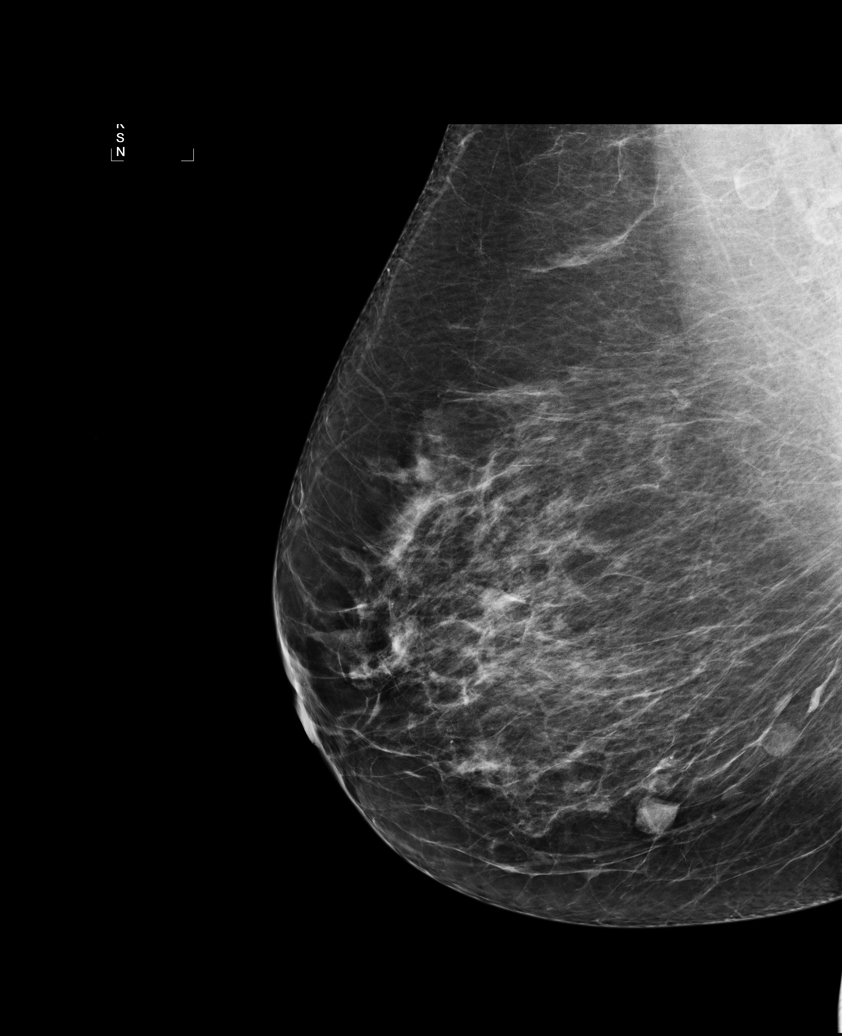

[L TAN]
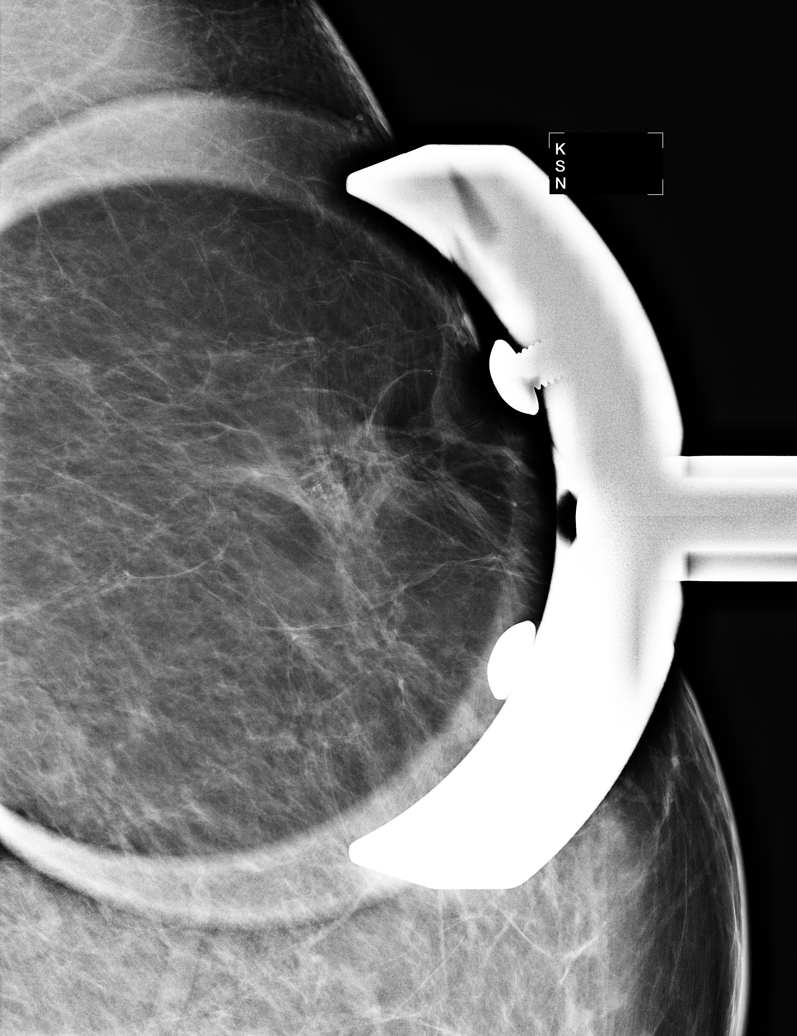

[5 of 5 positions shown; findings below may reference images not displayed]

FINDINGS: Stable scattered fibroglandular tissue in both breasts.
Stable post lumpectomy  changes on the left.  No new findings
suspicious for malignancy in either breast.
Mammographic images were processed with CAD.
IMPRESSION: No evidence of malignancy.  A follow up bilateral diagnostic
mammogram is recommended in one year.  Also recommended is
consideration of biennial screening MR of the breasts.

BI-RADS CATEGORY 2:  Benign finding(s).

## 2013-09-27 ENCOUNTER — Ambulatory Visit (INDEPENDENT_AMBULATORY_CARE_PROVIDER_SITE_OTHER): Payer: Medicare Other | Admitting: Family Medicine

## 2013-09-27 DIAGNOSIS — I82409 Acute embolism and thrombosis of unspecified deep veins of unspecified lower extremity: Secondary | ICD-10-CM

## 2013-09-27 DIAGNOSIS — Z9889 Other specified postprocedural states: Secondary | ICD-10-CM | POA: Diagnosis not present

## 2013-09-27 DIAGNOSIS — Z95828 Presence of other vascular implants and grafts: Secondary | ICD-10-CM

## 2013-10-10 ENCOUNTER — Telehealth: Payer: Self-pay | Admitting: Family Medicine

## 2013-10-10 DIAGNOSIS — E7800 Pure hypercholesterolemia, unspecified: Secondary | ICD-10-CM

## 2013-10-10 DIAGNOSIS — M8580 Other specified disorders of bone density and structure, unspecified site: Secondary | ICD-10-CM

## 2013-10-10 DIAGNOSIS — I10 Essential (primary) hypertension: Secondary | ICD-10-CM

## 2013-10-10 DIAGNOSIS — E559 Vitamin D deficiency, unspecified: Secondary | ICD-10-CM

## 2013-10-10 DIAGNOSIS — D509 Iron deficiency anemia, unspecified: Secondary | ICD-10-CM

## 2013-10-10 DIAGNOSIS — E119 Type 2 diabetes mellitus without complications: Secondary | ICD-10-CM

## 2013-10-10 NOTE — Telephone Encounter (Signed)
Message copied by Excell Seltzer on Wed Oct 10, 2013 11:52 PM ------      Message from: Alvina Chou      Created: Tue Oct 09, 2013 10:10 AM      Regarding: Lab orders for Thursday, 11.6.14       Patient is scheduled for CPX labs, please order future labs, Thanks , Terri       ------

## 2013-10-12 ENCOUNTER — Other Ambulatory Visit (INDEPENDENT_AMBULATORY_CARE_PROVIDER_SITE_OTHER): Payer: Medicare Other

## 2013-10-12 DIAGNOSIS — E119 Type 2 diabetes mellitus without complications: Secondary | ICD-10-CM | POA: Diagnosis not present

## 2013-10-12 DIAGNOSIS — I10 Essential (primary) hypertension: Secondary | ICD-10-CM

## 2013-10-12 LAB — COMPREHENSIVE METABOLIC PANEL
Albumin: 3.5 g/dL (ref 3.5–5.2)
BUN: 17 mg/dL (ref 6–23)
CO2: 24 mEq/L (ref 19–32)
Calcium: 9.6 mg/dL (ref 8.4–10.5)
Chloride: 107 mEq/L (ref 96–112)
Creatinine, Ser: 0.8 mg/dL (ref 0.4–1.2)
GFR: 71.35 mL/min (ref >60.00)
Potassium: 3.9 mEq/L (ref 3.5–5.1)
Total Bilirubin: 0.5 mg/dL (ref 0.3–1.2)

## 2013-10-12 LAB — LIPID PANEL
HDL: 30.2 mg/dL — ABNORMAL LOW (ref >39.00)
Total CHOL/HDL Ratio: 5
Triglycerides: 171 mg/dL — ABNORMAL HIGH (ref 0.0–149.0)

## 2013-10-12 LAB — HEMOGLOBIN A1C: Hgb A1c MFr Bld: 6 % (ref 4.6–6.5)

## 2013-10-19 ENCOUNTER — Ambulatory Visit (INDEPENDENT_AMBULATORY_CARE_PROVIDER_SITE_OTHER): Payer: Medicare Other | Admitting: Family Medicine

## 2013-10-19 ENCOUNTER — Encounter: Payer: Self-pay | Admitting: Family Medicine

## 2013-10-19 VITALS — BP 120/88 | HR 75 | Temp 98.2°F | Ht 63.25 in | Wt 202.5 lb

## 2013-10-19 DIAGNOSIS — Z0000 Encounter for general adult medical examination without abnormal findings: Secondary | ICD-10-CM

## 2013-10-19 DIAGNOSIS — E7800 Pure hypercholesterolemia, unspecified: Secondary | ICD-10-CM

## 2013-10-19 DIAGNOSIS — I10 Essential (primary) hypertension: Secondary | ICD-10-CM

## 2013-10-19 DIAGNOSIS — G609 Hereditary and idiopathic neuropathy, unspecified: Secondary | ICD-10-CM

## 2013-10-19 DIAGNOSIS — E119 Type 2 diabetes mellitus without complications: Secondary | ICD-10-CM

## 2013-10-19 NOTE — Patient Instructions (Addendum)
Get back on track with healthy eating and regular exercise as tolerated. Work on [employer] loss. Look into shingles vaccine.

## 2013-10-19 NOTE — Assessment & Plan Note (Signed)
Almost at goal.. Get back on track with exercise and weight loss now that able to do .

## 2013-10-19 NOTE — Progress Notes (Signed)
Pre-visit discussion using our clinic review tool. No additional management support is needed unless otherwise documented below in the visit note.  

## 2013-10-19 NOTE — Progress Notes (Signed)
67 year old female with metastatic colon carcinoma with lung and chest wall metastasis, hx of breast cancer, HTN, DVT ( on coumadin) and DM  Presents for medicare wellness.  I have personally reviewed the Medicare Annual Wellness questionnaire and have noted  1. The patient's medical and social history  2. Their use of alcohol, tobacco or illicit drugs  3. Their current medications and supplements  4. The patient's functional ability including ADL's, fall risks, home safety risks and hearing or visual  impairment.  5. Diet and physical activities  6. Evidence for depression or mood disorders  The patients weight, height, BMI and visual acuity have been recorded in the chart  I have made referrals, counseling and provided education to the patient based review of the above and I have provided the pt with a written personalized care plan for preventive services.   Breast and colon cancer followed closely by Dr. Welton Flakes , Dr. Carolynne Edouard surgeon  Had chest, abd, pelvis stable except small nodule abdominal soft tissue. Has follow up scans in 12/2013  Diabetes: well controlled on no medicaiton  Lab Results  Component Value Date   HGBA1C 6.0 10/12/2013  Using medications without difficulties:  Hypoglycemic episodes:?  Hyperglycemic episodes:?  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 Elevated likely due to [place] trip right prior to blood Lab Results  Component Value Date   CHOL 150 10/12/2013   HDL 30.20* 10/12/2013   LDLCALC 86 10/12/2013   LDLDIRECT 145.4 11/03/2010   TRIG 171.0* 10/12/2013   CHOLHDL 5 10/12/2013  Using medications without problems: None  Muscle aches: None  Diet compliance: Good  Exercise:  Getting back to walking, but ankle still painful. Other complaints:   Hypertension: Well controlled on avapro  Using  medication without problems or lightheadedness: None  Chest pain with exertion: None  Edema:None  Short of breath:Stable.Marland Kitchen No changes  Average home BPs: At goal 110/70  Other issues:  BP Readings from Last 3 Encounters:  10/19/13 120/88  09/11/13 151/94  07/17/13 151/91   Review of Systems  Constitutional: Negative for fever, fatigue and unexpected weight change.  HENT:negative for ear pain and sinus pressure. Negative for congestion, sore throat, sneezing and trouble swallowing.  Eyes: Negative for pain and itching.  Respiratory: Negative for cough, chest tightness, shortness of breath and wheezing.  Cardiovascular: Negative for chest pain, palpitations and leg swelling.  Gastrointestinal: negative for constipation. Negative for nausea, abdominal pain, diarrhea and blood in stool.  Genitourinary: Negative for dysuria, hematuria, vaginal discharge, difficulty urinating and menstrual problem.  Skin: Negative for rash.  Neurological: Negative for syncope, weakness, light-headedness, numbness and headaches.  Psychiatric/Behavioral: Negative for confusion and dysphoric mood. The patient is not nervous/anxious.  Objective:   Physical Exam  GEN: nad, alert and oriented HEENT: mucous membranes moist, PERRLA NECK: supple w/o LA CV: rrr.  no murmur PULM: ctab, no inc wob ABD: soft, +bs EXT: no edema SKIN: no acute rash GYN: ext nml, no adnexal ttp, no uterine or ovarian enlargement, NO PAP PERFORMED  Breast exam: scar on left upper breast, firm some nodules associated not mobile.. Right breast nml, neg axillary lymph nodes bilaterally. Assessment & Plan:   The patient's preventative maintenance and recommended screening tests for an annual wellness exam were reviewed in full today.  Brought up to date unless  services declined.  Counselled on the importance of diet, exercise, and its role in overall health and mortality.  The patient's FH and SH was reviewed, including their home life,  tobacco status, and drug and alcohol status.   Vaccines: Considering shingles, has recieved flu at pharm.  Mammo: Hx of breast cancer.. Per Dr. Welton Flakes,  02/2013 showed calcium deposits in scar, repeat in 08/2013 no change.Marland Kitchen Has follow up in 6 months. Bone density:11/13 osteopenia Colon: history of stage 4 colon cancer... Per GI Dr. Loreta Ave and Dr. Welton Flakes, colonoscopy 02/2013  Negative.. Repeat in 3-5 years. PAP/DVE: DVE yearly, pap not indicated, no symptoms.

## 2013-10-19 NOTE — Assessment & Plan Note (Signed)
Well controlled on no med. 

## 2013-10-25 ENCOUNTER — Ambulatory Visit (INDEPENDENT_AMBULATORY_CARE_PROVIDER_SITE_OTHER): Payer: Medicare Other | Admitting: Family Medicine

## 2013-10-25 DIAGNOSIS — I82409 Acute embolism and thrombosis of unspecified deep veins of unspecified lower extremity: Secondary | ICD-10-CM | POA: Diagnosis not present

## 2013-10-25 DIAGNOSIS — Z9889 Other specified postprocedural states: Secondary | ICD-10-CM | POA: Diagnosis not present

## 2013-10-25 DIAGNOSIS — Z95828 Presence of other vascular implants and grafts: Secondary | ICD-10-CM

## 2013-11-06 ENCOUNTER — Ambulatory Visit (HOSPITAL_BASED_OUTPATIENT_CLINIC_OR_DEPARTMENT_OTHER): Payer: Medicare Other

## 2013-11-06 ENCOUNTER — Other Ambulatory Visit: Payer: Self-pay | Admitting: Family Medicine

## 2013-11-06 VITALS — BP 144/78 | HR 72 | Temp 97.0°F

## 2013-11-06 DIAGNOSIS — Z452 Encounter for adjustment and management of vascular access device: Secondary | ICD-10-CM | POA: Diagnosis not present

## 2013-11-06 DIAGNOSIS — C189 Malignant neoplasm of colon, unspecified: Secondary | ICD-10-CM

## 2013-11-06 DIAGNOSIS — Z95828 Presence of other vascular implants and grafts: Secondary | ICD-10-CM

## 2013-11-06 MED ORDER — HEPARIN SOD (PORK) LOCK FLUSH 100 UNIT/ML IV SOLN
500.0000 [IU] | Freq: Once | INTRAVENOUS | Status: AC
  Administered 2013-11-06: 500 [IU] via INTRAVENOUS
  Filled 2013-11-06: qty 5

## 2013-11-06 MED ORDER — SODIUM CHLORIDE 0.9 % IJ SOLN
10.0000 mL | INTRAMUSCULAR | Status: DC | PRN
  Administered 2013-11-06: 10 mL via INTRAVENOUS
  Filled 2013-11-06: qty 10

## 2013-11-06 NOTE — Patient Instructions (Signed)
Implanted Port Instructions  An implanted port is a central line that has a round shape and is placed under the skin. It is used for long-term IV (intravenous) access for:  · Medicine.  · Fluids.  · Liquid nutrition, such as TPN (total parenteral nutrition).  · Blood samples.  Ports can be placed:  · In the chest area just below the collarbone (this is the most common place.)  · In the arms.  · In the belly (abdomen) area.  · In the legs.  PARTS OF THE PORT  A port has 2 main parts:  · The reservoir. The reservoir is round, disc-shaped, and will be a small, raised area under your skin.  · The reservoir is the part where a needle is inserted (accessed) to either give medicines or to draw blood.  · The catheter. The catheter is a long, slender tube that extends from the reservoir. The catheter is placed into a large vein.  · Medicine that is inserted into the reservoir goes into the catheter and then into the vein.  INSERTION OF THE PORT  · The port is surgically placed in either an operating room or in a procedural area (interventional radiology).  · Medicine may be given to help you relax during the procedure.  · The skin where the port will be inserted is numbed (local anesthetic).  · 1 or 2 small cuts (incisions) will be made in the skin to insert the port.  · The port can be used after it has been inserted.  INCISION SITE CARE  · The incision site may have small adhesive strips on it. This helps keep the incision site closed. Sometimes, no adhesive strips are placed. Instead of adhesive strips, a special kind of surgical glue is used to keep the incision closed.  · If adhesive strips were placed on the incision sites, do not take them off. They will fall off on their own.  · The incision site may be sore for 1 to 2 days. Pain medicine can help.  · Do not get the incision site wet. Bathe or shower as directed by your caregiver.  · The incision site should heal in 5 to 7 days. A small scar may form after the  incision has healed.  ACCESSING THE PORT  Special steps must be taken to access the port:  · Before the port is accessed, a numbing cream can be placed on the skin. This helps numb the skin over the port site.  · A sterile technique is used to access the port.  · The port is accessed with a needle. Only "non-coring" port needles should be used to access the port. Once the port is accessed, a blood return should be checked. This helps ensure the port is in the vein and is not clogged (clotted).  · If your caregiver believes your port should remain accessed, a clear (transparent) bandage will be placed over the needle site. The bandage and needle will need to be changed every week or as directed by your caregiver.  · Keep the bandage covering the needle clean and dry. Do not get it wet. Follow your caregiver's instructions on how to take a shower or bath when the port is accessed.  · If your port does not need to stay accessed, no bandage is needed over the port.  FLUSHING THE PORT  Flushing the port keeps it from getting clogged. How often the port is flushed depends on:  · If a   constant infusion is running. If a constant infusion is running, the port may not need to be flushed.  · If intermittent medicines are given.  · If the port is not being used.  For intermittent medicines:  · The port will need to be flushed:  · After medicines have been given.  · After blood has been drawn.  · As part of routine maintenance.  · A port is normally flushed with:  · Normal saline.  · Heparin.  · Follow your caregiver's advice on how often, how much, and the type of flush to use on your port.  IMPORTANT PORT INFORMATION  · Tell your caregiver if you are allergic to heparin.  · After your port is placed, you will get a manufacturer's information card. The card has information about your port. Keep this card with you at all times.  · There are many types of ports available. Know what kind of port you have.  · In case of an  emergency, it may be helpful to wear a medical alert bracelet. This can help alert health care workers that you have a port.  · The port can stay in for as long as your caregiver believes it is necessary.  · When it is time for the port to come out, surgery will be done to remove it. The surgery will be similar to how the port was put in.  · If you are in the hospital or clinic:  · Your port will be taken care of and flushed by a nurse.  · If you are at home:  · A home health care nurse may give medicines and take care of the port.  · You or a family member can get special training and directions for giving medicine and taking care of the port at home.  SEEK IMMEDIATE MEDICAL CARE IF:   · Your port does not flush or you are unable to get a blood return.  · New drainage or pus is coming from the incision.  · A bad smell is coming from the incision site.  · You develop swelling or increased redness at the incision site.  · You develop increased swelling or pain at the port site.  · You develop swelling or pain in the surrounding skin near the port.  · You have an oral temperature above 102° F (38.9° C), not controlled by medicine.  MAKE SURE YOU:   · Understand these instructions.  · Will watch your condition.  · Will get help right away if you are not doing well or get worse.  Document Released: 11/22/2005 Document Revised: 02/14/2012 Document Reviewed: 02/13/2009  [employer]® Patient Information ©2014 [employer].

## 2013-11-19 ENCOUNTER — Other Ambulatory Visit: Payer: Self-pay | Admitting: Family Medicine

## 2013-11-22 ENCOUNTER — Ambulatory Visit (INDEPENDENT_AMBULATORY_CARE_PROVIDER_SITE_OTHER): Payer: Medicare Other | Admitting: Family Medicine

## 2013-11-22 DIAGNOSIS — I82409 Acute embolism and thrombosis of unspecified deep veins of unspecified lower extremity: Secondary | ICD-10-CM | POA: Diagnosis not present

## 2013-11-22 DIAGNOSIS — Z7901 Long term (current) use of anticoagulants: Secondary | ICD-10-CM | POA: Diagnosis not present

## 2013-11-22 DIAGNOSIS — Z95828 Presence of other vascular implants and grafts: Secondary | ICD-10-CM

## 2013-12-20 ENCOUNTER — Encounter: Payer: Self-pay | Admitting: Oncology

## 2013-12-20 ENCOUNTER — Telehealth: Payer: Self-pay | Admitting: *Deleted

## 2013-12-20 ENCOUNTER — Ambulatory Visit (HOSPITAL_BASED_OUTPATIENT_CLINIC_OR_DEPARTMENT_OTHER): Payer: Medicare Other | Admitting: Oncology

## 2013-12-20 ENCOUNTER — Other Ambulatory Visit (HOSPITAL_BASED_OUTPATIENT_CLINIC_OR_DEPARTMENT_OTHER): Payer: Medicare Other

## 2013-12-20 ENCOUNTER — Ambulatory Visit (INDEPENDENT_AMBULATORY_CARE_PROVIDER_SITE_OTHER): Payer: Medicare Other | Admitting: Family Medicine

## 2013-12-20 VITALS — BP 148/86 | HR 80 | Temp 97.9°F | Resp 20 | Ht 63.25 in | Wt 206.9 lb

## 2013-12-20 DIAGNOSIS — Z86718 Personal history of other venous thrombosis and embolism: Secondary | ICD-10-CM | POA: Diagnosis not present

## 2013-12-20 DIAGNOSIS — C189 Malignant neoplasm of colon, unspecified: Secondary | ICD-10-CM

## 2013-12-20 DIAGNOSIS — Z85038 Personal history of other malignant neoplasm of large intestine: Secondary | ICD-10-CM | POA: Diagnosis not present

## 2013-12-20 DIAGNOSIS — Z9889 Other specified postprocedural states: Secondary | ICD-10-CM | POA: Diagnosis not present

## 2013-12-20 DIAGNOSIS — D0590 Unspecified type of carcinoma in situ of unspecified breast: Secondary | ICD-10-CM

## 2013-12-20 DIAGNOSIS — C50919 Malignant neoplasm of unspecified site of unspecified female breast: Secondary | ICD-10-CM

## 2013-12-20 DIAGNOSIS — I82409 Acute embolism and thrombosis of unspecified deep veins of unspecified lower extremity: Secondary | ICD-10-CM | POA: Diagnosis not present

## 2013-12-20 DIAGNOSIS — D0511 Intraductal carcinoma in situ of right breast: Secondary | ICD-10-CM

## 2013-12-20 DIAGNOSIS — R229 Localized swelling, mass and lump, unspecified: Secondary | ICD-10-CM | POA: Diagnosis not present

## 2013-12-20 DIAGNOSIS — Z7901 Long term (current) use of anticoagulants: Secondary | ICD-10-CM | POA: Diagnosis not present

## 2013-12-20 DIAGNOSIS — Z95828 Presence of other vascular implants and grafts: Secondary | ICD-10-CM

## 2013-12-20 LAB — COMPREHENSIVE METABOLIC PANEL (CC13)
ALK PHOS: 144 U/L (ref 40–150)
ALT: 18 U/L (ref 0–55)
AST: 21 U/L (ref 5–34)
Albumin: 3.7 g/dL (ref 3.5–5.0)
Anion Gap: 10 mEq/L (ref 3–11)
BUN: 23.2 mg/dL (ref 7.0–26.0)
CALCIUM: 10.1 mg/dL (ref 8.4–10.4)
CO2: 27 mEq/L (ref 22–29)
Chloride: 104 mEq/L (ref 98–109)
Creatinine: 1 mg/dL (ref 0.6–1.1)
Glucose: 103 mg/dl (ref 70–140)
Potassium: 4.1 mEq/L (ref 3.5–5.1)
SODIUM: 141 meq/L (ref 136–145)
TOTAL PROTEIN: 8.5 g/dL — AB (ref 6.4–8.3)
Total Bilirubin: 0.6 mg/dL (ref 0.20–1.20)

## 2013-12-20 LAB — CBC WITH DIFFERENTIAL/PLATELET
BASO%: 0.4 % (ref 0.0–2.0)
BASOS ABS: 0 10*3/uL (ref 0.0–0.1)
EOS%: 2.2 % (ref 0.0–7.0)
Eosinophils Absolute: 0.2 10*3/uL (ref 0.0–0.5)
HCT: 44.4 % (ref 34.8–46.6)
HEMOGLOBIN: 15.4 g/dL (ref 11.6–15.9)
LYMPH%: 16.3 % (ref 14.0–49.7)
MCH: 33.3 pg (ref 25.1–34.0)
MCHC: 34.7 g/dL (ref 31.5–36.0)
MCV: 96.1 fL (ref 79.5–101.0)
MONO#: 0.8 10*3/uL (ref 0.1–0.9)
MONO%: 9.7 % (ref 0.0–14.0)
NEUT#: 6 10*3/uL (ref 1.5–6.5)
NEUT%: 71.4 % (ref 38.4–76.8)
Platelets: 257 10*3/uL (ref 145–400)
RBC: 4.62 10*6/uL (ref 3.70–5.45)
RDW: 12.9 % (ref 11.2–14.5)
WBC: 8.4 10*3/uL (ref 3.9–10.3)
lymph#: 1.4 10*3/uL (ref 0.9–3.3)

## 2013-12-20 LAB — CEA: CEA: 1.3 ng/mL (ref 0.0–5.0)

## 2013-12-20 LAB — HM DIABETES EYE EXAM

## 2013-12-20 LAB — POCT INR: INR: 2.2

## 2013-12-20 NOTE — Telephone Encounter (Signed)
appts made and printed. Pt is aware that cs will call w/ appt for CT ABD/ PELVIS/CHEST.....td

## 2013-12-20 NOTE — Progress Notes (Signed)
OFFICE PROGRESS NOTE  CC  Susan Lofts, MD [address]. 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 Thus far she has received 8 cycles of FOLFOX Avastin. Most recently we discontinued it due to side effects and she has been on observation only.  #2 patient also has DCIS of the left breast she underwent a lumpectomy followed by radiation and now is on Aromasin 25 mg daily begun in spring 2010  #3 patient previously was on tamoxifen and unfortunate she developed a deep venous thrombosis. She is now on Coumadin to titrate her INR between 2 and 2.5. This is being managed by [facility] at [facility]  CURRENT THERAPY: Aromasin 25 mg daily and observation for colon cancer  INTERVAL HISTORY: Susan Duffy 68 y.o. female returns for Followup visit today.Overall she is doing well. She is still having pain and difficulty due to the left foot fracture. No fevers or chills, no n/v/d or constipation. No abdominal pain or SOB, no bleeding Remainder of the 10 point ROS is negative.  MEDICAL HISTORY: Past Medical History  Diagnosis Date  . Anemia     iron defiency  . Arthritis     osteoarthritis  . Allergy   . TIA (transient ischemic attack) 03/08/2011  . Hypertension   . Cancer 12/25/08    Left DCIS,ERPR+,Metastatic colon     ALLERGIES:  is allergic to amoxicillin and erythromycin.  MEDICATIONS:  Current Outpatient Prescriptions  Medication Sig Dispense Refill  . acetaminophen (TYLENOL) 500 MG tablet as needed. Take 1-2 by mouth every 6 hours as needed       . aspirin 81 MG chewable  tablet Chew 81 mg by mouth daily.      . cholecalciferol (VITAMIN D) 400 UNITS TABS tablet Take 400 Units by mouth 2 (two) times daily.      . Cyanocobalamin (VITAMIN B-12) 2000 MCG TBCR Take 1 tablet by mouth daily.        Marland Kitchen exemestane (AROMASIN) 25 MG tablet TAKE 1 TABLET DAILY AFTER A MEAL.  90 tablet  1  . irbesartan (AVAPRO) 150 MG tablet TAKE 1 TABLET BY MOUTH EVERY DAY  90 tablet  1  . simvastatin (ZOCOR) 20 MG tablet TAKE 1 TABLET BY MOUTH EVERY DAY AT BEDTIME  90 tablet  2  . warfarin (COUMADIN) 5 MG tablet USE AS DIRECTED BY COUMADIN CLINIC  90 tablet  1   No current facility-administered medications for this visit.    SURGICAL HISTORY:  Past Surgical History  Procedure Laterality Date  . Colon surgery      colectomy, partial  . Breast biopsy  1-10    fibroma removal, s/p lumpectomy and radiation   . Lobectomy  06-2009    metastatic colon cancer  . Breast lumpectomy  2010    left breast  . Broken ankle  Left 08/2012    REVIEW OF SYSTEMS:  Pertinent items are noted in HPI.   PHYSICAL EXAMINATION: General appearance: alert, cooperative and appears stated age Neck: no adenopathy, no carotid bruit, no JVD, supple, symmetrical, trachea midline and thyroid not enlarged,  symmetric, no tenderness/mass/nodules Lymph nodes: Cervical, supraclavicular, and axillary nodes normal. Resp: clear to auscultation bilaterally and normal percussion bilaterally Back: symmetric, no curvature. ROM normal. No CVA tenderness. Abdomen: Soft nontender nondistended bowel sounds are present no other splenomegaly. Extremities: No edema clubbing or cyanosis. Neuro: Alert oriented strength is symmetrical in upper and lower extremities otherwise nonfocal.  ECOG PERFORMANCE STATUS: 0 - Asymptomatic  Blood pressure 148/86, pulse 80, temperature 97.9 F (36.6 C), temperature source Oral, resp. rate 20, height 5' 3.25" (1.607 m), weight 206 lb 14.4 oz (93.849 kg).  LABORATORY DATA: Lab Results  Component  Value Date   WBC 8.4 12/20/2013   HGB 15.4 12/20/2013   HCT 44.4 12/20/2013   MCV 96.1 12/20/2013   PLT 257 12/20/2013      Chemistry      Component Value Date/Time   NA 141 12/20/2013 0901   NA 138 10/12/2013 0831   NA 142 07/03/2012 1108   K 4.1 12/20/2013 0901   K 3.9 10/12/2013 0831   K 4.0 07/03/2012 1108   CL 107 10/12/2013 0831   CL 106 12/18/2012 0842   CL 98 07/03/2012 1108   CO2 27 12/20/2013 0901   CO2 24 10/12/2013 0831   CO2 27 07/03/2012 1108   BUN 23.2 12/20/2013 0901   BUN 17 10/12/2013 0831   BUN 23* 07/03/2012 1108   CREATININE 1.0 12/20/2013 0901   CREATININE 0.8 10/12/2013 0831   CREATININE 1.1 07/03/2012 1108      Component Value Date/Time   CALCIUM 10.1 12/20/2013 0901   CALCIUM 9.6 10/12/2013 0831   CALCIUM 9.8 07/03/2012 1108   ALKPHOS 144 12/20/2013 0901   ALKPHOS 125* 10/12/2013 0831   ALKPHOS 117* 07/03/2012 1108   AST 21 12/20/2013 0901   AST 21 10/12/2013 0831   AST 29 07/03/2012 1108   ALT 18 12/20/2013 0901   ALT 17 10/12/2013 0831   ALT 26 07/03/2012 1108   BILITOT 0.60 12/20/2013 0901   BILITOT 0.5 10/12/2013 0831   BILITOT 0.80 07/03/2012 1108       RADIOGRAPHIC STUDIES:  CT CHEST, ABDOMEN AND PELVIS WITH CONTRAST  Technique: Multidetector CT imaging of the chest, abdomen and  pelvis was performed following the standard protocol during bolus  administration of intravenous contrast.  Contrast: 157mL OMNIPAQUE IOHEXOL 300 MG/ML SOLN  Comparison: Multiple priors, most recently a CT of the chest,  abdomen and pelvis dated 03/13/2012.  CT CHEST  Findings:  Mediastinum: Heart size is normal. There is no significant  pericardial fluid, thickening or pericardial calcification. There  is a small hiatal hernia. No pathologically enlarged mediastinal or  hilar lymph nodes. Left-sided subclavian single lumen Port-A-Cath  with tip terminating at the superior cavoatrial junction.  Lungs/Pleura: Status post left lower lobectomy. Compensatory  hyperexpansion of the left  upper lobe, although there continues to  be some mild right to left shift of cardiomediastinal structures.  No suspicious appearing pulmonary nodules or masses are identified.  No consolidative airspace disease. No pleural effusions.  Musculoskeletal: There are no aggressive appearing lytic or blastic  lesions noted in the visualized portions of the skeleton. 12 mm  soft tissue attenuation nodule in the medial aspect of the right  breast is smoothly marginated and unchanged in size compared to  prior examinations, presumably a benign finding such as a  intraparenchymal lymph node or fibroadenoma. A focal area of  architectural distortion in the left breast is unchanged, likely to  represent a site of prior lumpectomy.  IMPRESSION:  1. No suspicious findings to suggest new metastatic disease to the  thorax.  2. Status post left lower lobectomy.  3. Postoperative changes and support apparatus, as above.  CT ABDOMEN AND PELVIS  Findings:  Abdomen/Pelvis: The enhanced appearance of the liver, gallbladder,  pancreas, spleen, bilateral adrenal glands and to the right kidney  is unremarkable. In the anterior aspect of the left kidney there  is a 5.8 x 4.7 cm cyst that has a tiny punctate area of  calcification along the anteromedial margin (unchanged).  Status post partial sigmoidectomy. No abnormal soft tissue mass  around the suture line to suggest local recurrence of disease. No  ascites or pneumoperitoneum and no pathologic distension of bowel.  No definite pathologic lymphadenopathy identified within the  abdomen or pelvis. Appendix is normal in appearance. Uterus,  bilateral ovaries and urinary bladder are unremarkable in  appearance.  Musculoskeletal: There are no aggressive appearing lytic or blastic  lesions noted in the visualized portions of the skeleton. Slight  interval increase and 1.9 cm low attenuation (negative 35 HU)  lesion in the subcutaneous fat of the right anterior  abdominal wall  (image 73 of series 2) is nonspecific, but favored to represent an  area of evolving fat necrosis.  IMPRESSION:  1. No findings to suggest metastatic disease to the abdomen or  pelvis.  2. Status post partial sigmoidectomy.  3. Large cyst in the anterior aspect of the left kidney is  unchanged.  4. Normal appendix.  5. Slight interval increase in size of low attenuation  subcutaneous lesion in the right anterior abdominal wall  subcutaneous fat. Overall, this lesion is most consistent with an  area of fat necrosis, however, continued attention on follow-up  studies is recommended.  Mammogram: Clinical Data: Personal history of left breast cancer status post  lumpectomy 2010  DIGITAL DIAGNOSTIC BILATERAL MAMMOGRAM WITH CAD  Comparison: December 01, 2011, November 26, 2010, November 24, 2009  Findings:  ACR Breast Density Category scattered fibroglandular tissue  CC and MLO views of bilateral breasts, spot tangential view of left  breast, spot magnification CC and lateral views of the left breast  are submitted. There are stable asymmetries within bilateral  breasts. Within the postsurgical scar left breast, there is  probable dystrophic calcifications.  Mammographic images were processed with CAD.  IMPRESSION:  Probable benign findings.  RECOMMENDATION:  87-month follow-up mammogram for calcifications in surgical scar  left breast.  I have discussed the findings and recommendations with the patient.  Results were also provided in writing at the conclusion of the  visit.  BI-RADS CATEGORY 3: Probably benign finding(s) - short interval  follow-up suggested.  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 She is noted to have a abdominal subcutaneous  nodule near the umbilicus unclear etiology we will continue to monitor this. Plan to do surveillance scans in 1 year or sooner if need arises  #2 DCIS of the left rest status post lumpectomy followed by radiation and now on Aromasin and tolerating it well.  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  #4 recent mammogram with calcifications and she she will have a repeat mammogram in 6 months   PLAN:   #1 patient will be seen back in 6 month's time with  restaging scans.  #2 she will continue to be seen at her primary care physician's office for monitoring of Coumadin levels.  #3 she will continue Aromasin for DCIS.  #4 patient knows to call me with any problems questions or concerns.  All questions were answered. The patient knows to call the clinic with any problems, questions or concerns. We can certainly see the patient much sooner if necessary.  I spent 25 minutes counseling the patient face to face. The total time spent in the appointment was 30 minutes.    Marcy Panning, MD Medical/Oncology [facility] [phone] (beeper) [phone] (Office)  12/20/2013, 9:55 AM

## 2013-12-26 ENCOUNTER — Ambulatory Visit (HOSPITAL_COMMUNITY)
Admission: RE | Admit: 2013-12-26 | Discharge: 2013-12-26 | Disposition: A | Discharge status: Home / Self Care | Payer: Medicare Other | Source: Ambulatory Visit | Attending: Oncology | Admitting: Oncology

## 2013-12-26 DIAGNOSIS — C7800 Secondary malignant neoplasm of unspecified lung: Secondary | ICD-10-CM | POA: Diagnosis not present

## 2013-12-26 DIAGNOSIS — C189 Malignant neoplasm of colon, unspecified: Secondary | ICD-10-CM

## 2013-12-26 DIAGNOSIS — M4120 Other idiopathic scoliosis, site unspecified: Secondary | ICD-10-CM | POA: Diagnosis not present

## 2013-12-26 DIAGNOSIS — I700 Atherosclerosis of aorta: Secondary | ICD-10-CM | POA: Diagnosis not present

## 2013-12-26 DIAGNOSIS — C3490 Malignant neoplasm of unspecified part of unspecified bronchus or lung: Secondary | ICD-10-CM | POA: Diagnosis not present

## 2013-12-26 DIAGNOSIS — N281 Cyst of kidney, acquired: Secondary | ICD-10-CM | POA: Diagnosis not present

## 2013-12-26 DIAGNOSIS — IMO0002 Reserved for concepts with insufficient information to code with codable children: Secondary | ICD-10-CM | POA: Insufficient documentation

## 2013-12-26 MED ORDER — IOHEXOL 300 MG/ML  SOLN
100.0000 mL | Freq: Once | INTRAMUSCULAR | Status: AC | PRN
  Administered 2013-12-26: 100 mL via INTRAVENOUS

## 2013-12-29 ENCOUNTER — Encounter: Payer: Self-pay | Admitting: Oncology

## 2014-01-02 NOTE — Progress Notes (Signed)
Quick Note:  Please call patient: CT scans no evidence of cancer ______

## 2014-01-03 ENCOUNTER — Encounter: Payer: Self-pay | Admitting: Oncology

## 2014-01-04 ENCOUNTER — Telehealth: Payer: Self-pay | Admitting: Emergency Medicine

## 2014-01-04 NOTE — Telephone Encounter (Signed)
This message was printed & given to Dr. Laurelyn Sickle nurse for review.

## 2014-01-04 NOTE — Telephone Encounter (Signed)
Spoke with patient; informed her that CT scans look good per Dr Humphrey Rolls. Patient verbalized understanding.

## 2014-01-17 ENCOUNTER — Ambulatory Visit (INDEPENDENT_AMBULATORY_CARE_PROVIDER_SITE_OTHER): Payer: Medicare Other | Admitting: Family Medicine

## 2014-01-17 DIAGNOSIS — Z5181 Encounter for therapeutic drug level monitoring: Secondary | ICD-10-CM | POA: Diagnosis not present

## 2014-01-17 DIAGNOSIS — Z9889 Other specified postprocedural states: Secondary | ICD-10-CM

## 2014-01-17 DIAGNOSIS — I82409 Acute embolism and thrombosis of unspecified deep veins of unspecified lower extremity: Secondary | ICD-10-CM | POA: Diagnosis not present

## 2014-01-17 DIAGNOSIS — Z95828 Presence of other vascular implants and grafts: Secondary | ICD-10-CM

## 2014-01-17 LAB — POCT INR: INR: 2.6

## 2014-01-18 ENCOUNTER — Other Ambulatory Visit: Payer: Self-pay | Admitting: Family Medicine

## 2014-01-18 DIAGNOSIS — Z9889 Other specified postprocedural states: Secondary | ICD-10-CM

## 2014-01-18 DIAGNOSIS — Z853 Personal history of malignant neoplasm of breast: Secondary | ICD-10-CM

## 2014-02-03 ENCOUNTER — Other Ambulatory Visit: Payer: Self-pay | Admitting: Oncology

## 2014-02-14 ENCOUNTER — Ambulatory Visit (INDEPENDENT_AMBULATORY_CARE_PROVIDER_SITE_OTHER): Payer: Medicare Other | Admitting: Family Medicine

## 2014-02-14 DIAGNOSIS — Z5181 Encounter for therapeutic drug level monitoring: Secondary | ICD-10-CM

## 2014-02-14 DIAGNOSIS — I82409 Acute embolism and thrombosis of unspecified deep veins of unspecified lower extremity: Secondary | ICD-10-CM | POA: Diagnosis not present

## 2014-02-14 DIAGNOSIS — Z9889 Other specified postprocedural states: Secondary | ICD-10-CM

## 2014-02-14 DIAGNOSIS — Z95828 Presence of other vascular implants and grafts: Secondary | ICD-10-CM

## 2014-02-14 LAB — POCT INR: INR: 2.6

## 2014-02-19 ENCOUNTER — Ambulatory Visit (HOSPITAL_BASED_OUTPATIENT_CLINIC_OR_DEPARTMENT_OTHER): Payer: Medicare Other

## 2014-02-19 VITALS — BP 139/79 | HR 88 | Temp 98.2°F

## 2014-02-19 DIAGNOSIS — C189 Malignant neoplasm of colon, unspecified: Secondary | ICD-10-CM | POA: Diagnosis not present

## 2014-02-19 DIAGNOSIS — Z95828 Presence of other vascular implants and grafts: Secondary | ICD-10-CM

## 2014-02-19 DIAGNOSIS — Z452 Encounter for adjustment and management of vascular access device: Secondary | ICD-10-CM | POA: Diagnosis not present

## 2014-02-19 MED ORDER — HEPARIN SOD (PORK) LOCK FLUSH 100 UNIT/ML IV SOLN
500.0000 [IU] | Freq: Once | INTRAVENOUS | Status: AC
  Administered 2014-02-19: 500 [IU] via INTRAVENOUS
  Filled 2014-02-19: qty 5

## 2014-02-19 MED ORDER — SODIUM CHLORIDE 0.9 % IJ SOLN
10.0000 mL | INTRAMUSCULAR | Status: DC | PRN
  Administered 2014-02-19: 10 mL via INTRAVENOUS
  Filled 2014-02-19: qty 10

## 2014-02-19 NOTE — Patient Instructions (Signed)

## 2014-02-28 ENCOUNTER — Ambulatory Visit
Admission: RE | Admit: 2014-02-28 | Discharge: 2014-02-28 | Disposition: A | Discharge status: Home / Self Care | Payer: Medicare Other | Source: Ambulatory Visit | Attending: Family Medicine | Admitting: Family Medicine

## 2014-02-28 DIAGNOSIS — Z853 Personal history of malignant neoplasm of breast: Secondary | ICD-10-CM | POA: Diagnosis not present

## 2014-02-28 DIAGNOSIS — R928 Other abnormal and inconclusive findings on diagnostic imaging of breast: Secondary | ICD-10-CM | POA: Diagnosis not present

## 2014-02-28 DIAGNOSIS — Z9889 Other specified postprocedural states: Secondary | ICD-10-CM

## 2014-03-19 DIAGNOSIS — M25579 Pain in unspecified ankle and joints of unspecified foot: Secondary | ICD-10-CM | POA: Diagnosis not present

## 2014-03-19 DIAGNOSIS — M9981 Other biomechanical lesions of cervical region: Secondary | ICD-10-CM | POA: Diagnosis not present

## 2014-03-19 DIAGNOSIS — M545 Low back pain, unspecified: Secondary | ICD-10-CM | POA: Diagnosis not present

## 2014-03-19 DIAGNOSIS — M999 Biomechanical lesion, unspecified: Secondary | ICD-10-CM | POA: Diagnosis not present

## 2014-03-19 DIAGNOSIS — M5137 Other intervertebral disc degeneration, lumbosacral region: Secondary | ICD-10-CM | POA: Diagnosis not present

## 2014-03-19 DIAGNOSIS — M25559 Pain in unspecified hip: Secondary | ICD-10-CM | POA: Diagnosis not present

## 2014-03-19 DIAGNOSIS — M546 Pain in thoracic spine: Secondary | ICD-10-CM | POA: Diagnosis not present

## 2014-03-20 DIAGNOSIS — M9981 Other biomechanical lesions of cervical region: Secondary | ICD-10-CM | POA: Diagnosis not present

## 2014-03-20 DIAGNOSIS — M545 Low back pain, unspecified: Secondary | ICD-10-CM | POA: Diagnosis not present

## 2014-03-20 DIAGNOSIS — M5137 Other intervertebral disc degeneration, lumbosacral region: Secondary | ICD-10-CM | POA: Diagnosis not present

## 2014-03-20 DIAGNOSIS — M999 Biomechanical lesion, unspecified: Secondary | ICD-10-CM | POA: Diagnosis not present

## 2014-03-20 DIAGNOSIS — M25579 Pain in unspecified ankle and joints of unspecified foot: Secondary | ICD-10-CM | POA: Diagnosis not present

## 2014-03-20 DIAGNOSIS — M25559 Pain in unspecified hip: Secondary | ICD-10-CM | POA: Diagnosis not present

## 2014-03-20 DIAGNOSIS — M546 Pain in thoracic spine: Secondary | ICD-10-CM | POA: Diagnosis not present

## 2014-03-22 DIAGNOSIS — M9981 Other biomechanical lesions of cervical region: Secondary | ICD-10-CM | POA: Diagnosis not present

## 2014-03-22 DIAGNOSIS — M5137 Other intervertebral disc degeneration, lumbosacral region: Secondary | ICD-10-CM | POA: Diagnosis not present

## 2014-03-22 DIAGNOSIS — M25559 Pain in unspecified hip: Secondary | ICD-10-CM | POA: Diagnosis not present

## 2014-03-22 DIAGNOSIS — M546 Pain in thoracic spine: Secondary | ICD-10-CM | POA: Diagnosis not present

## 2014-03-22 DIAGNOSIS — M999 Biomechanical lesion, unspecified: Secondary | ICD-10-CM | POA: Diagnosis not present

## 2014-03-22 DIAGNOSIS — M545 Low back pain, unspecified: Secondary | ICD-10-CM | POA: Diagnosis not present

## 2014-03-22 DIAGNOSIS — M25579 Pain in unspecified ankle and joints of unspecified foot: Secondary | ICD-10-CM | POA: Diagnosis not present

## 2014-03-25 DIAGNOSIS — M545 Low back pain, unspecified: Secondary | ICD-10-CM | POA: Diagnosis not present

## 2014-03-25 DIAGNOSIS — M25559 Pain in unspecified hip: Secondary | ICD-10-CM | POA: Diagnosis not present

## 2014-03-25 DIAGNOSIS — M546 Pain in thoracic spine: Secondary | ICD-10-CM | POA: Diagnosis not present

## 2014-03-25 DIAGNOSIS — M999 Biomechanical lesion, unspecified: Secondary | ICD-10-CM | POA: Diagnosis not present

## 2014-03-25 DIAGNOSIS — M5137 Other intervertebral disc degeneration, lumbosacral region: Secondary | ICD-10-CM | POA: Diagnosis not present

## 2014-03-25 DIAGNOSIS — M9981 Other biomechanical lesions of cervical region: Secondary | ICD-10-CM | POA: Diagnosis not present

## 2014-03-25 DIAGNOSIS — M25579 Pain in unspecified ankle and joints of unspecified foot: Secondary | ICD-10-CM | POA: Diagnosis not present

## 2014-03-27 DIAGNOSIS — M25559 Pain in unspecified hip: Secondary | ICD-10-CM | POA: Diagnosis not present

## 2014-03-27 DIAGNOSIS — M546 Pain in thoracic spine: Secondary | ICD-10-CM | POA: Diagnosis not present

## 2014-03-27 DIAGNOSIS — M9981 Other biomechanical lesions of cervical region: Secondary | ICD-10-CM | POA: Diagnosis not present

## 2014-03-27 DIAGNOSIS — M545 Low back pain, unspecified: Secondary | ICD-10-CM | POA: Diagnosis not present

## 2014-03-27 DIAGNOSIS — M5137 Other intervertebral disc degeneration, lumbosacral region: Secondary | ICD-10-CM | POA: Diagnosis not present

## 2014-03-27 DIAGNOSIS — M999 Biomechanical lesion, unspecified: Secondary | ICD-10-CM | POA: Diagnosis not present

## 2014-03-27 DIAGNOSIS — M25579 Pain in unspecified ankle and joints of unspecified foot: Secondary | ICD-10-CM | POA: Diagnosis not present

## 2014-03-28 ENCOUNTER — Ambulatory Visit: Payer: Medicare Other

## 2014-03-29 ENCOUNTER — Ambulatory Visit (INDEPENDENT_AMBULATORY_CARE_PROVIDER_SITE_OTHER): Payer: Medicare Other | Admitting: Family Medicine

## 2014-03-29 DIAGNOSIS — I82409 Acute embolism and thrombosis of unspecified deep veins of unspecified lower extremity: Secondary | ICD-10-CM

## 2014-03-29 DIAGNOSIS — Z9889 Other specified postprocedural states: Secondary | ICD-10-CM

## 2014-03-29 DIAGNOSIS — Z5181 Encounter for therapeutic drug level monitoring: Secondary | ICD-10-CM

## 2014-03-29 DIAGNOSIS — Z95828 Presence of other vascular implants and grafts: Secondary | ICD-10-CM

## 2014-03-29 LAB — POCT INR: INR: 4.4

## 2014-04-01 DIAGNOSIS — M25559 Pain in unspecified hip: Secondary | ICD-10-CM | POA: Diagnosis not present

## 2014-04-01 DIAGNOSIS — M5137 Other intervertebral disc degeneration, lumbosacral region: Secondary | ICD-10-CM | POA: Diagnosis not present

## 2014-04-01 DIAGNOSIS — M546 Pain in thoracic spine: Secondary | ICD-10-CM | POA: Diagnosis not present

## 2014-04-01 DIAGNOSIS — M999 Biomechanical lesion, unspecified: Secondary | ICD-10-CM | POA: Diagnosis not present

## 2014-04-01 DIAGNOSIS — M25579 Pain in unspecified ankle and joints of unspecified foot: Secondary | ICD-10-CM | POA: Diagnosis not present

## 2014-04-01 DIAGNOSIS — M545 Low back pain, unspecified: Secondary | ICD-10-CM | POA: Diagnosis not present

## 2014-04-01 DIAGNOSIS — M9981 Other biomechanical lesions of cervical region: Secondary | ICD-10-CM | POA: Diagnosis not present

## 2014-04-03 DIAGNOSIS — M546 Pain in thoracic spine: Secondary | ICD-10-CM | POA: Diagnosis not present

## 2014-04-03 DIAGNOSIS — M9981 Other biomechanical lesions of cervical region: Secondary | ICD-10-CM | POA: Diagnosis not present

## 2014-04-03 DIAGNOSIS — M25559 Pain in unspecified hip: Secondary | ICD-10-CM | POA: Diagnosis not present

## 2014-04-03 DIAGNOSIS — M25579 Pain in unspecified ankle and joints of unspecified foot: Secondary | ICD-10-CM | POA: Diagnosis not present

## 2014-04-03 DIAGNOSIS — M999 Biomechanical lesion, unspecified: Secondary | ICD-10-CM | POA: Diagnosis not present

## 2014-04-03 DIAGNOSIS — M545 Low back pain, unspecified: Secondary | ICD-10-CM | POA: Diagnosis not present

## 2014-04-03 DIAGNOSIS — M5137 Other intervertebral disc degeneration, lumbosacral region: Secondary | ICD-10-CM | POA: Diagnosis not present

## 2014-04-08 DIAGNOSIS — M545 Low back pain, unspecified: Secondary | ICD-10-CM | POA: Diagnosis not present

## 2014-04-08 DIAGNOSIS — M999 Biomechanical lesion, unspecified: Secondary | ICD-10-CM | POA: Diagnosis not present

## 2014-04-08 DIAGNOSIS — M25559 Pain in unspecified hip: Secondary | ICD-10-CM | POA: Diagnosis not present

## 2014-04-08 DIAGNOSIS — M9981 Other biomechanical lesions of cervical region: Secondary | ICD-10-CM | POA: Diagnosis not present

## 2014-04-08 DIAGNOSIS — M25579 Pain in unspecified ankle and joints of unspecified foot: Secondary | ICD-10-CM | POA: Diagnosis not present

## 2014-04-08 DIAGNOSIS — M546 Pain in thoracic spine: Secondary | ICD-10-CM | POA: Diagnosis not present

## 2014-04-08 DIAGNOSIS — M5137 Other intervertebral disc degeneration, lumbosacral region: Secondary | ICD-10-CM | POA: Diagnosis not present

## 2014-04-09 ENCOUNTER — Telehealth: Payer: Self-pay | Admitting: Family Medicine

## 2014-04-09 ENCOUNTER — Ambulatory Visit (HOSPITAL_BASED_OUTPATIENT_CLINIC_OR_DEPARTMENT_OTHER): Payer: Medicare Other

## 2014-04-09 VITALS — BP 150/79 | HR 74 | Temp 98.2°F | Resp 16

## 2014-04-09 DIAGNOSIS — D0590 Unspecified type of carcinoma in situ of unspecified breast: Secondary | ICD-10-CM | POA: Diagnosis not present

## 2014-04-09 DIAGNOSIS — Z95828 Presence of other vascular implants and grafts: Secondary | ICD-10-CM

## 2014-04-09 DIAGNOSIS — E7800 Pure hypercholesterolemia, unspecified: Secondary | ICD-10-CM

## 2014-04-09 DIAGNOSIS — Z452 Encounter for adjustment and management of vascular access device: Secondary | ICD-10-CM

## 2014-04-09 DIAGNOSIS — M8580 Other specified disorders of bone density and structure, unspecified site: Secondary | ICD-10-CM

## 2014-04-09 DIAGNOSIS — D509 Iron deficiency anemia, unspecified: Secondary | ICD-10-CM

## 2014-04-09 DIAGNOSIS — E119 Type 2 diabetes mellitus without complications: Secondary | ICD-10-CM

## 2014-04-09 MED ORDER — SODIUM CHLORIDE 0.9 % IJ SOLN
10.0000 mL | INTRAMUSCULAR | Status: DC | PRN
  Administered 2014-04-09: 10 mL via INTRAVENOUS
  Filled 2014-04-09: qty 10

## 2014-04-09 MED ORDER — HEPARIN SOD (PORK) LOCK FLUSH 100 UNIT/ML IV SOLN
500.0000 [IU] | Freq: Once | INTRAVENOUS | Status: AC
  Administered 2014-04-09: 500 [IU] via INTRAVENOUS
  Filled 2014-04-09: qty 5

## 2014-04-09 NOTE — Telephone Encounter (Signed)
Message copied by Jinny Sanders on Tue Apr 09, 2014  8:53 AM ------      Message from: Ellamae Sia      Created: Fri Apr 05, 2014 10:32 AM      Regarding: Lab orders for Friday, 5.8.15       Lab orders for a 6 month f/u ------

## 2014-04-09 NOTE — Patient Instructions (Signed)

## 2014-04-10 DIAGNOSIS — M545 Low back pain, unspecified: Secondary | ICD-10-CM | POA: Diagnosis not present

## 2014-04-10 DIAGNOSIS — M25579 Pain in unspecified ankle and joints of unspecified foot: Secondary | ICD-10-CM | POA: Diagnosis not present

## 2014-04-10 DIAGNOSIS — M999 Biomechanical lesion, unspecified: Secondary | ICD-10-CM | POA: Diagnosis not present

## 2014-04-10 DIAGNOSIS — M9981 Other biomechanical lesions of cervical region: Secondary | ICD-10-CM | POA: Diagnosis not present

## 2014-04-10 DIAGNOSIS — M5137 Other intervertebral disc degeneration, lumbosacral region: Secondary | ICD-10-CM | POA: Diagnosis not present

## 2014-04-10 DIAGNOSIS — M25559 Pain in unspecified hip: Secondary | ICD-10-CM | POA: Diagnosis not present

## 2014-04-10 DIAGNOSIS — M546 Pain in thoracic spine: Secondary | ICD-10-CM | POA: Diagnosis not present

## 2014-04-12 ENCOUNTER — Other Ambulatory Visit (INDEPENDENT_AMBULATORY_CARE_PROVIDER_SITE_OTHER): Payer: Medicare Other

## 2014-04-12 DIAGNOSIS — E119 Type 2 diabetes mellitus without complications: Secondary | ICD-10-CM

## 2014-04-12 DIAGNOSIS — M899 Disorder of bone, unspecified: Secondary | ICD-10-CM | POA: Diagnosis not present

## 2014-04-12 DIAGNOSIS — M949 Disorder of cartilage, unspecified: Secondary | ICD-10-CM | POA: Diagnosis not present

## 2014-04-12 DIAGNOSIS — E7800 Pure hypercholesterolemia, unspecified: Secondary | ICD-10-CM | POA: Diagnosis not present

## 2014-04-12 DIAGNOSIS — M8580 Other specified disorders of bone density and structure, unspecified site: Secondary | ICD-10-CM

## 2014-04-12 LAB — COMPREHENSIVE METABOLIC PANEL
ALK PHOS: 111 U/L (ref 39–117)
ALT: 22 U/L (ref 0–35)
AST: 26 U/L (ref 0–37)
Albumin: 3.9 g/dL (ref 3.5–5.2)
BILIRUBIN TOTAL: 0.8 mg/dL (ref 0.2–1.2)
BUN: 19 mg/dL (ref 6–23)
CALCIUM: 9.7 mg/dL (ref 8.4–10.5)
CHLORIDE: 106 meq/L (ref 96–112)
CO2: 26 mEq/L (ref 19–32)
Creatinine, Ser: 1 mg/dL (ref 0.4–1.2)
GFR: 61.81 mL/min (ref >60.00)
Glucose, Bld: 103 mg/dL — ABNORMAL HIGH (ref 70–99)
Potassium: 4.1 mEq/L (ref 3.5–5.1)
Sodium: 140 mEq/L (ref 135–145)
Total Protein: 8 g/dL (ref 6.0–8.3)

## 2014-04-12 LAB — LIPID PANEL
CHOL/HDL RATIO: 5
CHOLESTEROL: 153 mg/dL (ref 0–200)
HDL: 30.5 mg/dL — AB (ref >39.00)
LDL CALC: 92 mg/dL (ref 0–99)
TRIGLYCERIDES: 153 mg/dL — AB (ref 0.0–149.0)
VLDL: 30.6 mg/dL (ref 0.0–40.0)

## 2014-04-12 LAB — HEMOGLOBIN A1C: Hgb A1c MFr Bld: 6 % (ref 4.6–6.5)

## 2014-04-13 LAB — VITAMIN D 25 HYDROXY (VIT D DEFICIENCY, FRACTURES): Vit D, 25-Hydroxy: 58 ng/mL (ref 30–89)

## 2014-04-15 DIAGNOSIS — M5137 Other intervertebral disc degeneration, lumbosacral region: Secondary | ICD-10-CM | POA: Diagnosis not present

## 2014-04-15 DIAGNOSIS — M545 Low back pain, unspecified: Secondary | ICD-10-CM | POA: Diagnosis not present

## 2014-04-15 DIAGNOSIS — M25579 Pain in unspecified ankle and joints of unspecified foot: Secondary | ICD-10-CM | POA: Diagnosis not present

## 2014-04-15 DIAGNOSIS — M546 Pain in thoracic spine: Secondary | ICD-10-CM | POA: Diagnosis not present

## 2014-04-15 DIAGNOSIS — M9981 Other biomechanical lesions of cervical region: Secondary | ICD-10-CM | POA: Diagnosis not present

## 2014-04-15 DIAGNOSIS — M999 Biomechanical lesion, unspecified: Secondary | ICD-10-CM | POA: Diagnosis not present

## 2014-04-15 DIAGNOSIS — M25559 Pain in unspecified hip: Secondary | ICD-10-CM | POA: Diagnosis not present

## 2014-04-17 ENCOUNTER — Telehealth: Payer: Self-pay

## 2014-04-17 DIAGNOSIS — M545 Low back pain, unspecified: Secondary | ICD-10-CM | POA: Diagnosis not present

## 2014-04-17 DIAGNOSIS — M999 Biomechanical lesion, unspecified: Secondary | ICD-10-CM | POA: Diagnosis not present

## 2014-04-17 DIAGNOSIS — M5137 Other intervertebral disc degeneration, lumbosacral region: Secondary | ICD-10-CM | POA: Diagnosis not present

## 2014-04-17 DIAGNOSIS — M25579 Pain in unspecified ankle and joints of unspecified foot: Secondary | ICD-10-CM | POA: Diagnosis not present

## 2014-04-17 DIAGNOSIS — M25559 Pain in unspecified hip: Secondary | ICD-10-CM | POA: Diagnosis not present

## 2014-04-17 DIAGNOSIS — M9981 Other biomechanical lesions of cervical region: Secondary | ICD-10-CM | POA: Diagnosis not present

## 2014-04-17 DIAGNOSIS — M546 Pain in thoracic spine: Secondary | ICD-10-CM | POA: Diagnosis not present

## 2014-04-17 NOTE — Telephone Encounter (Signed)
Relevant patient education assigned to patient using Emmi. ° °

## 2014-04-18 ENCOUNTER — Other Ambulatory Visit: Payer: Self-pay | Admitting: Family Medicine

## 2014-04-19 ENCOUNTER — Encounter: Payer: Self-pay | Admitting: Family Medicine

## 2014-04-19 ENCOUNTER — Ambulatory Visit (INDEPENDENT_AMBULATORY_CARE_PROVIDER_SITE_OTHER): Payer: Medicare Other | Admitting: Family Medicine

## 2014-04-19 ENCOUNTER — Telehealth: Payer: Self-pay | Admitting: Family Medicine

## 2014-04-19 VITALS — BP 142/82 | HR 65 | Temp 98.3°F | Ht 63.25 in | Wt 208.5 lb

## 2014-04-19 DIAGNOSIS — E119 Type 2 diabetes mellitus without complications: Secondary | ICD-10-CM | POA: Diagnosis not present

## 2014-04-19 DIAGNOSIS — E7800 Pure hypercholesterolemia, unspecified: Secondary | ICD-10-CM | POA: Diagnosis not present

## 2014-04-19 DIAGNOSIS — I10 Essential (primary) hypertension: Secondary | ICD-10-CM

## 2014-04-19 LAB — HM DIABETES FOOT EXAM

## 2014-04-19 MED ORDER — SIMVASTATIN 40 MG PO TABS: ORAL_TABLET | ORAL | Status: DC

## 2014-04-19 NOTE — Telephone Encounter (Signed)
Relevant patient education assigned to patient using Emmi. ° °

## 2014-04-19 NOTE — Progress Notes (Signed)
  68 year old female presents for 6 month Follow up DM.  Diabetes: well controlled on no medicaiton  Lab Results  Component Value Date   HGBA1C 6.0 04/12/2014  Using medications without difficulties:  Hypoglycemic episodes:?  Hyperglycemic episodes:?  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 Lab Results  Component Value Date   CHOL 153 04/12/2014   HDL 30.50* 04/12/2014   LDLCALC 92 04/12/2014   LDLDIRECT 145.4 11/03/2010   TRIG 153.0* 04/12/2014   CHOLHDL 5 04/12/2014   Using medications without problems: None  Muscle aches: None  Diet compliance: Good  Exercise: Some short walking, but ankle still painful.  Seeing ortho. Other complaints:   Hypertension: Well controlled on avapro  Using medication without problems or lightheadedness: None  Chest pain with exertion: None  Edema:None  Short of breath:Stable.Marland Kitchen No changes  Average home BPs: At goal 115/80  Other issues:  BP Readings from Last 3 Encounters:  04/19/14 142/82  04/09/14 150/79  02/19/14 139/79    Review of Systems  Constitutional: Negative for fever, fatigue and unexpected weight change.  HENT:negative for ear pain and sinus pressure. Negative for congestion, sore throat, sneezing and trouble swallowing.  Eyes: Negative for pain and itching.  Respiratory: Negative for cough, chest tightness, shortness of breath and wheezing.  Cardiovascular: Negative for chest pain, palpitations and leg swelling.  Gastrointestinal: negative for constipation. Negative for nausea, abdominal pain, diarrhea and blood in stool.  Genitourinary: Negative for dysuria, hematuria, vaginal discharge, difficulty urinating and menstrual problem.  Skin: Negative for rash.  Neurological: Negative for syncope, weakness, light-headedness, numbness and headaches.   Psychiatric/Behavioral: Negative for confusion and dysphoric mood. The patient is not nervous/anxious.  Objective:   Physical Exam GEN: nad, alert and oriented  HEENT: mucous membranes moist, PERRLA  NECK: supple w/o LA  CV: rrr. no murmur  PULM: ctab, no inc wob  ABD: soft, +bs  EXT: no edema  SKIN: no acute rash Neuro A and O x 3   Diabetic foot exam:  inspection: left ankle swollen with  past injury No skin breakdown No calluses  Normal DP pulses Normal sensation to light touch and monofilament Nails normal

## 2014-04-19 NOTE — Patient Instructions (Addendum)
Increase simvastatin to 40 mg daily. Work on exercise as tolerated. Set up fasting labs in 3 months for cholesterol check. Schedule medicare wellness in 6 months.

## 2014-04-19 NOTE — Assessment & Plan Note (Signed)
LDL not at goal. Increase simvastatin to 40 mg daily.

## 2014-04-19 NOTE — Assessment & Plan Note (Signed)
Well controlled. Continue current medication.  

## 2014-04-19 NOTE — Progress Notes (Signed)
Pre visit review using our clinic review tool, if applicable. No additional management support is needed unless otherwise documented below in the visit note. 

## 2014-04-19 NOTE — Assessment & Plan Note (Signed)
Well controlled with diet.

## 2014-04-25 ENCOUNTER — Ambulatory Visit (INDEPENDENT_AMBULATORY_CARE_PROVIDER_SITE_OTHER): Payer: Medicare Other | Admitting: Family Medicine

## 2014-04-25 DIAGNOSIS — Z9889 Other specified postprocedural states: Secondary | ICD-10-CM | POA: Diagnosis not present

## 2014-04-25 DIAGNOSIS — I82409 Acute embolism and thrombosis of unspecified deep veins of unspecified lower extremity: Secondary | ICD-10-CM | POA: Diagnosis not present

## 2014-04-25 DIAGNOSIS — Z95828 Presence of other vascular implants and grafts: Secondary | ICD-10-CM

## 2014-04-25 DIAGNOSIS — Z5181 Encounter for therapeutic drug level monitoring: Secondary | ICD-10-CM | POA: Diagnosis not present

## 2014-04-25 LAB — POCT INR: INR: 2.1

## 2014-04-26 DIAGNOSIS — M545 Low back pain, unspecified: Secondary | ICD-10-CM | POA: Diagnosis not present

## 2014-04-26 DIAGNOSIS — M999 Biomechanical lesion, unspecified: Secondary | ICD-10-CM | POA: Diagnosis not present

## 2014-04-26 DIAGNOSIS — M25579 Pain in unspecified ankle and joints of unspecified foot: Secondary | ICD-10-CM | POA: Diagnosis not present

## 2014-04-26 DIAGNOSIS — M5137 Other intervertebral disc degeneration, lumbosacral region: Secondary | ICD-10-CM | POA: Diagnosis not present

## 2014-04-26 DIAGNOSIS — M9981 Other biomechanical lesions of cervical region: Secondary | ICD-10-CM | POA: Diagnosis not present

## 2014-04-26 DIAGNOSIS — M546 Pain in thoracic spine: Secondary | ICD-10-CM | POA: Diagnosis not present

## 2014-04-26 DIAGNOSIS — M25559 Pain in unspecified hip: Secondary | ICD-10-CM | POA: Diagnosis not present

## 2014-04-30 DIAGNOSIS — M999 Biomechanical lesion, unspecified: Secondary | ICD-10-CM | POA: Diagnosis not present

## 2014-04-30 DIAGNOSIS — M9981 Other biomechanical lesions of cervical region: Secondary | ICD-10-CM | POA: Diagnosis not present

## 2014-04-30 DIAGNOSIS — M25579 Pain in unspecified ankle and joints of unspecified foot: Secondary | ICD-10-CM | POA: Diagnosis not present

## 2014-04-30 DIAGNOSIS — M545 Low back pain, unspecified: Secondary | ICD-10-CM | POA: Diagnosis not present

## 2014-04-30 DIAGNOSIS — M546 Pain in thoracic spine: Secondary | ICD-10-CM | POA: Diagnosis not present

## 2014-04-30 DIAGNOSIS — M25559 Pain in unspecified hip: Secondary | ICD-10-CM | POA: Diagnosis not present

## 2014-04-30 DIAGNOSIS — M5137 Other intervertebral disc degeneration, lumbosacral region: Secondary | ICD-10-CM | POA: Diagnosis not present

## 2014-05-03 DIAGNOSIS — M546 Pain in thoracic spine: Secondary | ICD-10-CM | POA: Diagnosis not present

## 2014-05-03 DIAGNOSIS — M545 Low back pain, unspecified: Secondary | ICD-10-CM | POA: Diagnosis not present

## 2014-05-03 DIAGNOSIS — M9981 Other biomechanical lesions of cervical region: Secondary | ICD-10-CM | POA: Diagnosis not present

## 2014-05-03 DIAGNOSIS — M25559 Pain in unspecified hip: Secondary | ICD-10-CM | POA: Diagnosis not present

## 2014-05-03 DIAGNOSIS — M5137 Other intervertebral disc degeneration, lumbosacral region: Secondary | ICD-10-CM | POA: Diagnosis not present

## 2014-05-03 DIAGNOSIS — M999 Biomechanical lesion, unspecified: Secondary | ICD-10-CM | POA: Diagnosis not present

## 2014-05-03 DIAGNOSIS — M25579 Pain in unspecified ankle and joints of unspecified foot: Secondary | ICD-10-CM | POA: Diagnosis not present

## 2014-05-07 DIAGNOSIS — M999 Biomechanical lesion, unspecified: Secondary | ICD-10-CM | POA: Diagnosis not present

## 2014-05-07 DIAGNOSIS — M5137 Other intervertebral disc degeneration, lumbosacral region: Secondary | ICD-10-CM | POA: Diagnosis not present

## 2014-05-07 DIAGNOSIS — M545 Low back pain, unspecified: Secondary | ICD-10-CM | POA: Diagnosis not present

## 2014-05-07 DIAGNOSIS — M25579 Pain in unspecified ankle and joints of unspecified foot: Secondary | ICD-10-CM | POA: Diagnosis not present

## 2014-05-07 DIAGNOSIS — M546 Pain in thoracic spine: Secondary | ICD-10-CM | POA: Diagnosis not present

## 2014-05-07 DIAGNOSIS — M25559 Pain in unspecified hip: Secondary | ICD-10-CM | POA: Diagnosis not present

## 2014-05-07 DIAGNOSIS — M9981 Other biomechanical lesions of cervical region: Secondary | ICD-10-CM | POA: Diagnosis not present

## 2014-05-10 DIAGNOSIS — M545 Low back pain, unspecified: Secondary | ICD-10-CM | POA: Diagnosis not present

## 2014-05-10 DIAGNOSIS — M5137 Other intervertebral disc degeneration, lumbosacral region: Secondary | ICD-10-CM | POA: Diagnosis not present

## 2014-05-10 DIAGNOSIS — M25559 Pain in unspecified hip: Secondary | ICD-10-CM | POA: Diagnosis not present

## 2014-05-10 DIAGNOSIS — M546 Pain in thoracic spine: Secondary | ICD-10-CM | POA: Diagnosis not present

## 2014-05-10 DIAGNOSIS — M25579 Pain in unspecified ankle and joints of unspecified foot: Secondary | ICD-10-CM | POA: Diagnosis not present

## 2014-05-10 DIAGNOSIS — M999 Biomechanical lesion, unspecified: Secondary | ICD-10-CM | POA: Diagnosis not present

## 2014-05-10 DIAGNOSIS — M9981 Other biomechanical lesions of cervical region: Secondary | ICD-10-CM | POA: Diagnosis not present

## 2014-05-17 DIAGNOSIS — M545 Low back pain, unspecified: Secondary | ICD-10-CM | POA: Diagnosis not present

## 2014-05-17 DIAGNOSIS — M25579 Pain in unspecified ankle and joints of unspecified foot: Secondary | ICD-10-CM | POA: Diagnosis not present

## 2014-05-17 DIAGNOSIS — M25559 Pain in unspecified hip: Secondary | ICD-10-CM | POA: Diagnosis not present

## 2014-05-17 DIAGNOSIS — M999 Biomechanical lesion, unspecified: Secondary | ICD-10-CM | POA: Diagnosis not present

## 2014-05-17 DIAGNOSIS — M5137 Other intervertebral disc degeneration, lumbosacral region: Secondary | ICD-10-CM | POA: Diagnosis not present

## 2014-05-17 DIAGNOSIS — M546 Pain in thoracic spine: Secondary | ICD-10-CM | POA: Diagnosis not present

## 2014-05-17 DIAGNOSIS — M9981 Other biomechanical lesions of cervical region: Secondary | ICD-10-CM | POA: Diagnosis not present

## 2014-05-23 ENCOUNTER — Ambulatory Visit (INDEPENDENT_AMBULATORY_CARE_PROVIDER_SITE_OTHER): Payer: Medicare Other | Admitting: Family Medicine

## 2014-05-23 DIAGNOSIS — I82409 Acute embolism and thrombosis of unspecified deep veins of unspecified lower extremity: Secondary | ICD-10-CM

## 2014-05-23 DIAGNOSIS — Z9889 Other specified postprocedural states: Secondary | ICD-10-CM

## 2014-05-23 DIAGNOSIS — Z5181 Encounter for therapeutic drug level monitoring: Secondary | ICD-10-CM | POA: Diagnosis not present

## 2014-05-23 DIAGNOSIS — Z95828 Presence of other vascular implants and grafts: Secondary | ICD-10-CM

## 2014-05-23 LAB — POCT INR: INR: 2.9

## 2014-05-24 DIAGNOSIS — M25579 Pain in unspecified ankle and joints of unspecified foot: Secondary | ICD-10-CM | POA: Diagnosis not present

## 2014-05-24 DIAGNOSIS — M999 Biomechanical lesion, unspecified: Secondary | ICD-10-CM | POA: Diagnosis not present

## 2014-05-24 DIAGNOSIS — M545 Low back pain, unspecified: Secondary | ICD-10-CM | POA: Diagnosis not present

## 2014-05-24 DIAGNOSIS — M9981 Other biomechanical lesions of cervical region: Secondary | ICD-10-CM | POA: Diagnosis not present

## 2014-05-24 DIAGNOSIS — M546 Pain in thoracic spine: Secondary | ICD-10-CM | POA: Diagnosis not present

## 2014-05-24 DIAGNOSIS — M25559 Pain in unspecified hip: Secondary | ICD-10-CM | POA: Diagnosis not present

## 2014-05-24 DIAGNOSIS — M5137 Other intervertebral disc degeneration, lumbosacral region: Secondary | ICD-10-CM | POA: Diagnosis not present

## 2014-05-28 ENCOUNTER — Ambulatory Visit (HOSPITAL_BASED_OUTPATIENT_CLINIC_OR_DEPARTMENT_OTHER): Payer: Medicare Other

## 2014-05-28 VITALS — BP 183/81 | HR 70 | Temp 97.3°F

## 2014-05-28 DIAGNOSIS — C189 Malignant neoplasm of colon, unspecified: Secondary | ICD-10-CM

## 2014-05-28 DIAGNOSIS — Z452 Encounter for adjustment and management of vascular access device: Secondary | ICD-10-CM | POA: Diagnosis not present

## 2014-05-28 DIAGNOSIS — Z95828 Presence of other vascular implants and grafts: Secondary | ICD-10-CM

## 2014-05-28 MED ORDER — SODIUM CHLORIDE 0.9 % IJ SOLN
10.0000 mL | INTRAMUSCULAR | Status: DC | PRN
  Administered 2014-05-28: 10 mL via INTRAVENOUS
  Filled 2014-05-28: qty 10

## 2014-05-28 MED ORDER — HEPARIN SOD (PORK) LOCK FLUSH 100 UNIT/ML IV SOLN
500.0000 [IU] | Freq: Once | INTRAVENOUS | Status: AC
  Administered 2014-05-28: 500 [IU] via INTRAVENOUS
  Filled 2014-05-28: qty 5

## 2014-05-28 NOTE — Patient Instructions (Signed)
Implanted Port Home Guide  An implanted port is a type of central line that is placed under the skin. Central lines are used to provide IV access when treatment or nutrition needs to be given through a person's veins. Implanted ports are used for long-term IV access. An implanted port may be placed because:    You need IV medicine that would be irritating to the small veins in your hands or arms.    You need long-term IV medicines, such as antibiotics.    You need IV nutrition for a long period.    You need frequent blood draws for lab tests.    You need dialysis.   Implanted ports are usually placed in the chest area, but they can also be placed in the upper arm, the abdomen, or the leg. An implanted port has two main parts:    Reservoir. The reservoir is round and will appear as a small, raised area under your skin. The reservoir is the part where a needle is inserted to give medicines or draw blood.    Catheter. The catheter is a thin, flexible tube that extends from the reservoir. The catheter is placed into a large vein. Medicine that is inserted into the reservoir goes into the catheter and then into the vein.   HOW WILL I CARE FOR MY INCISION SITE?  Do not get the incision site wet. Bathe or shower as directed by your health care Matthew Cina.   HOW IS MY PORT ACCESSED?  Special steps must be taken to access the port:    Before the port is accessed, a numbing cream can be placed on the skin. This helps numb the skin over the port site.    Your health care Jaedyn Marrufo uses a sterile technique to access the port.   Your health care Anthonette Lesage must put on a mask and sterile gloves.   The skin over your port is cleaned carefully with an antiseptic and allowed to dry.   The port is gently pinched between sterile gloves, and a needle is inserted into the port.   Only "non-coring" port needles should be used to access the port. Once the port is accessed, a blood return should be checked. This helps  ensure that the port is in the vein and is not clogged.    If your port needs to remain accessed for a constant infusion, a clear (transparent) bandage will be placed over the needle site. The bandage and needle will need to be changed every week, or as directed by your health care Lateasha Breuer.    Keep the bandage covering the needle clean and dry. Do not get it wet. Follow your health care Jarrid Lienhard's instructions on how to take a shower or bath while the port is accessed.    If your port does not need to stay accessed, no bandage is needed over the port.   WHAT IS FLUSHING?  Flushing helps keep the port from getting clogged. Follow your health care Allaya Abbasi's instructions on how and when to flush the port. Ports are usually flushed with saline solution or a medicine called heparin. The need for flushing will depend on how the port is used.    If the port is used for intermittent medicines or blood draws, the port will need to be flushed:    After medicines have been given.    After blood has been drawn.    As part of routine maintenance.    If a constant infusion is   running, the port may not need to be flushed.   HOW LONG WILL MY PORT STAY IMPLANTED?  The port can stay in for as long as your health care Takyra Cantrall thinks it is needed. When it is time for the port to come out, surgery will be done to remove it. The procedure is similar to the one performed when the port was put in.   WHEN SHOULD I SEEK IMMEDIATE MEDICAL CARE?  When you have an implanted port, you should seek immediate medical care if:    You notice a bad smell coming from the incision site.    You have swelling, redness, or drainage at the incision site.    You have more swelling or pain at the port site or the surrounding area.    You have a fever that is not controlled with medicine.  Document Released: 11/22/2005 Document Revised: 09/12/2013 Document Reviewed: 07/30/2013  [employer] Patient Information 2015 [employer]. This  information is not intended to replace advice given to you by your health care Taunya Goral. Make sure you discuss any questions you have with your health care Trudy Kory.

## 2014-06-05 ENCOUNTER — Telehealth: Payer: Self-pay | Admitting: Oncology

## 2014-06-05 NOTE — Telephone Encounter (Signed)
MEDICAL RECORDS FAXED TO [facility] [phone]

## 2014-06-07 DIAGNOSIS — M999 Biomechanical lesion, unspecified: Secondary | ICD-10-CM | POA: Diagnosis not present

## 2014-06-07 DIAGNOSIS — M9981 Other biomechanical lesions of cervical region: Secondary | ICD-10-CM | POA: Diagnosis not present

## 2014-06-07 DIAGNOSIS — M25559 Pain in unspecified hip: Secondary | ICD-10-CM | POA: Diagnosis not present

## 2014-06-07 DIAGNOSIS — M25579 Pain in unspecified ankle and joints of unspecified foot: Secondary | ICD-10-CM | POA: Diagnosis not present

## 2014-06-07 DIAGNOSIS — M545 Low back pain, unspecified: Secondary | ICD-10-CM | POA: Diagnosis not present

## 2014-06-07 DIAGNOSIS — M5137 Other intervertebral disc degeneration, lumbosacral region: Secondary | ICD-10-CM | POA: Diagnosis not present

## 2014-06-07 DIAGNOSIS — M546 Pain in thoracic spine: Secondary | ICD-10-CM | POA: Diagnosis not present

## 2014-06-12 ENCOUNTER — Telehealth: Payer: Self-pay | Admitting: Hematology and Oncology

## 2014-06-12 ENCOUNTER — Other Ambulatory Visit: Payer: Self-pay | Admitting: *Deleted

## 2014-06-12 DIAGNOSIS — M25579 Pain in unspecified ankle and joints of unspecified foot: Secondary | ICD-10-CM | POA: Diagnosis not present

## 2014-06-12 DIAGNOSIS — T84498A Other mechanical complication of other internal orthopedic devices, implants and grafts, initial encounter: Secondary | ICD-10-CM | POA: Diagnosis not present

## 2014-06-12 MED ORDER — SIMVASTATIN 40 MG PO TABS: ORAL_TABLET | ORAL | Status: DC

## 2014-06-12 NOTE — Telephone Encounter (Addendum)
Received fax from [facility] that states patient needs a 90 day supply per insurance.

## 2014-06-20 ENCOUNTER — Ambulatory Visit (INDEPENDENT_AMBULATORY_CARE_PROVIDER_SITE_OTHER): Payer: Medicare Other | Admitting: Family Medicine

## 2014-06-20 DIAGNOSIS — Z9889 Other specified postprocedural states: Secondary | ICD-10-CM

## 2014-06-20 DIAGNOSIS — Z5181 Encounter for therapeutic drug level monitoring: Secondary | ICD-10-CM

## 2014-06-20 DIAGNOSIS — I82409 Acute embolism and thrombosis of unspecified deep veins of unspecified lower extremity: Secondary | ICD-10-CM

## 2014-06-20 DIAGNOSIS — Z95828 Presence of other vascular implants and grafts: Secondary | ICD-10-CM

## 2014-06-20 LAB — POCT INR: INR: 2.3

## 2014-06-21 ENCOUNTER — Other Ambulatory Visit: Payer: Medicare Other

## 2014-06-21 ENCOUNTER — Ambulatory Visit: Payer: Medicare Other | Admitting: Oncology

## 2014-06-24 ENCOUNTER — Ambulatory Visit: Payer: Self-pay | Admitting: Internal Medicine

## 2014-06-24 DIAGNOSIS — C7800 Secondary malignant neoplasm of unspecified lung: Secondary | ICD-10-CM | POA: Diagnosis not present

## 2014-06-24 DIAGNOSIS — C189 Malignant neoplasm of colon, unspecified: Secondary | ICD-10-CM | POA: Diagnosis not present

## 2014-06-24 DIAGNOSIS — D509 Iron deficiency anemia, unspecified: Secondary | ICD-10-CM | POA: Diagnosis not present

## 2014-06-24 DIAGNOSIS — Z9223 Personal history of estrogen therapy: Secondary | ICD-10-CM | POA: Diagnosis not present

## 2014-06-24 DIAGNOSIS — Z7982 Long term (current) use of aspirin: Secondary | ICD-10-CM | POA: Diagnosis not present

## 2014-06-24 DIAGNOSIS — Z803 Family history of malignant neoplasm of breast: Secondary | ICD-10-CM | POA: Diagnosis not present

## 2014-06-24 DIAGNOSIS — Z7901 Long term (current) use of anticoagulants: Secondary | ICD-10-CM | POA: Diagnosis not present

## 2014-06-24 DIAGNOSIS — Z8673 Personal history of transient ischemic attack (TIA), and cerebral infarction without residual deficits: Secondary | ICD-10-CM | POA: Diagnosis not present

## 2014-06-24 DIAGNOSIS — E559 Vitamin D deficiency, unspecified: Secondary | ICD-10-CM | POA: Diagnosis not present

## 2014-06-24 DIAGNOSIS — Z853 Personal history of malignant neoplasm of breast: Secondary | ICD-10-CM | POA: Diagnosis not present

## 2014-06-24 DIAGNOSIS — C50919 Malignant neoplasm of unspecified site of unspecified female breast: Secondary | ICD-10-CM | POA: Diagnosis not present

## 2014-06-24 DIAGNOSIS — Z923 Personal history of irradiation: Secondary | ICD-10-CM | POA: Diagnosis not present

## 2014-06-24 DIAGNOSIS — Z79899 Other long term (current) drug therapy: Secondary | ICD-10-CM | POA: Diagnosis not present

## 2014-06-24 DIAGNOSIS — Z9221 Personal history of antineoplastic chemotherapy: Secondary | ICD-10-CM | POA: Diagnosis not present

## 2014-06-24 DIAGNOSIS — M1990 Unspecified osteoarthritis, unspecified site: Secondary | ICD-10-CM | POA: Diagnosis not present

## 2014-06-24 DIAGNOSIS — M19079 Primary osteoarthritis, unspecified ankle and foot: Secondary | ICD-10-CM | POA: Diagnosis not present

## 2014-06-24 DIAGNOSIS — I10 Essential (primary) hypertension: Secondary | ICD-10-CM | POA: Diagnosis not present

## 2014-06-24 DIAGNOSIS — C779 Secondary and unspecified malignant neoplasm of lymph node, unspecified: Secondary | ICD-10-CM | POA: Diagnosis not present

## 2014-06-24 DIAGNOSIS — Z86718 Personal history of other venous thrombosis and embolism: Secondary | ICD-10-CM | POA: Diagnosis not present

## 2014-06-24 DIAGNOSIS — E785 Hyperlipidemia, unspecified: Secondary | ICD-10-CM | POA: Diagnosis not present

## 2014-06-24 LAB — CBC CANCER CENTER
BASOS PCT: 0.5 %
Basophil #: 0.1 x10 3/mm (ref 0.0–0.1)
Eosinophil #: 0.1 x10 3/mm (ref 0.0–0.7)
Eosinophil %: 0.8 %
HCT: 44.7 % (ref 35.0–47.0)
HGB: 15 g/dL (ref 12.0–16.0)
LYMPHS ABS: 1.1 x10 3/mm (ref 1.0–3.6)
Lymphocyte %: 11.2 %
MCH: 32.7 pg (ref 26.0–34.0)
MCHC: 33.5 g/dL (ref 32.0–36.0)
MCV: 98 fL (ref 80–100)
Monocyte #: 0.8 x10 3/mm (ref 0.2–0.9)
Monocyte %: 7.7 %
NEUTROS PCT: 79.8 %
Neutrophil #: 7.9 x10 3/mm — ABNORMAL HIGH (ref 1.4–6.5)
PLATELETS: 262 x10 3/mm (ref 150–440)
RBC: 4.58 10*6/uL (ref 3.80–5.20)
RDW: 12.8 % (ref 11.5–14.5)
WBC: 9.9 x10 3/mm (ref 3.6–11.0)

## 2014-06-24 LAB — CREATININE, SERUM
CREATININE: 1 mg/dL (ref 0.60–1.30)
EGFR (African American): >60
EGFR (Non-African Amer.): 57 — ABNORMAL LOW

## 2014-06-24 LAB — HEPATIC FUNCTION PANEL A (ARMC)
ALK PHOS: 131 U/L — AB
ALT: 30 U/L (ref 12–78)
AST: 31 U/L (ref 15–37)
Albumin: 3.9 g/dL (ref 3.4–5.0)
Bilirubin, Direct: 0.1 mg/dL (ref 0.00–0.20)
Bilirubin,Total: 0.5 mg/dL (ref 0.2–1.0)
Total Protein: 8.9 g/dL — ABNORMAL HIGH (ref 6.4–8.2)

## 2014-06-25 DIAGNOSIS — Z853 Personal history of malignant neoplasm of breast: Secondary | ICD-10-CM | POA: Diagnosis not present

## 2014-06-25 DIAGNOSIS — C189 Malignant neoplasm of colon, unspecified: Secondary | ICD-10-CM | POA: Diagnosis not present

## 2014-06-25 DIAGNOSIS — Z9223 Personal history of estrogen therapy: Secondary | ICD-10-CM | POA: Diagnosis not present

## 2014-06-25 DIAGNOSIS — C50919 Malignant neoplasm of unspecified site of unspecified female breast: Secondary | ICD-10-CM | POA: Diagnosis not present

## 2014-06-25 DIAGNOSIS — C7800 Secondary malignant neoplasm of unspecified lung: Secondary | ICD-10-CM | POA: Diagnosis not present

## 2014-06-25 DIAGNOSIS — Z9221 Personal history of antineoplastic chemotherapy: Secondary | ICD-10-CM | POA: Diagnosis not present

## 2014-06-25 LAB — CEA: CEA: 1.9 ng/mL (ref 0.0–4.7)

## 2014-06-27 ENCOUNTER — Ambulatory Visit: Payer: Self-pay | Admitting: Anesthesiology

## 2014-06-27 ENCOUNTER — Telehealth: Payer: Self-pay | Admitting: Family Medicine

## 2014-06-27 DIAGNOSIS — M79609 Pain in unspecified limb: Secondary | ICD-10-CM | POA: Diagnosis not present

## 2014-06-27 DIAGNOSIS — I10 Essential (primary) hypertension: Secondary | ICD-10-CM | POA: Diagnosis not present

## 2014-06-27 DIAGNOSIS — Z01810 Encounter for preprocedural cardiovascular examination: Secondary | ICD-10-CM | POA: Diagnosis not present

## 2014-06-27 LAB — URINE IEP, RANDOM

## 2014-06-27 LAB — PROT IMMUNOELECTROPHORES(ARMC)

## 2014-06-27 NOTE — Telephone Encounter (Signed)
Yes, hold for 5 days, begin Coumadin again the evening of the surgery (7/28) and recheck INR after

## 2014-06-27 NOTE — Telephone Encounter (Signed)
Pt is having ankle hardware replacement surgery on Tuesday 7/28.  The pre-op nurse told her that instructions on holding Coumadin would be up to her PCP.  Do you want to have her hold for 5 days, begin Coumadin again the evening of the surgery (7/28) and recheck INR after?

## 2014-06-28 NOTE — Telephone Encounter (Signed)
Pt informed.  She will have to be off of her ankle x 1 week.  Appt scheduled to check INR 1 week after.

## 2014-07-02 ENCOUNTER — Ambulatory Visit: Payer: Self-pay | Admitting: Orthopedic Surgery

## 2014-07-02 DIAGNOSIS — Z85038 Personal history of other malignant neoplasm of large intestine: Secondary | ICD-10-CM | POA: Diagnosis not present

## 2014-07-02 DIAGNOSIS — I10 Essential (primary) hypertension: Secondary | ICD-10-CM | POA: Diagnosis not present

## 2014-07-02 DIAGNOSIS — E669 Obesity, unspecified: Secondary | ICD-10-CM | POA: Diagnosis not present

## 2014-07-02 DIAGNOSIS — T8489XA Other specified complication of internal orthopedic prosthetic devices, implants and grafts, initial encounter: Secondary | ICD-10-CM | POA: Diagnosis not present

## 2014-07-02 DIAGNOSIS — D649 Anemia, unspecified: Secondary | ICD-10-CM | POA: Diagnosis not present

## 2014-07-02 DIAGNOSIS — Z8673 Personal history of transient ischemic attack (TIA), and cerebral infarction without residual deficits: Secondary | ICD-10-CM | POA: Diagnosis not present

## 2014-07-02 DIAGNOSIS — Z85118 Personal history of other malignant neoplasm of bronchus and lung: Secondary | ICD-10-CM | POA: Diagnosis not present

## 2014-07-02 DIAGNOSIS — T847XXA Infection and inflammatory reaction due to other internal orthopedic prosthetic devices, implants and grafts, initial encounter: Secondary | ICD-10-CM | POA: Diagnosis not present

## 2014-07-02 DIAGNOSIS — Z472 Encounter for removal of internal fixation device: Secondary | ICD-10-CM | POA: Diagnosis not present

## 2014-07-02 DIAGNOSIS — E7800 Pure hypercholesterolemia, unspecified: Secondary | ICD-10-CM | POA: Diagnosis not present

## 2014-07-02 DIAGNOSIS — Z79899 Other long term (current) drug therapy: Secondary | ICD-10-CM | POA: Diagnosis not present

## 2014-07-02 DIAGNOSIS — M25579 Pain in unspecified ankle and joints of unspecified foot: Secondary | ICD-10-CM | POA: Diagnosis not present

## 2014-07-02 DIAGNOSIS — G5790 Unspecified mononeuropathy of unspecified lower limb: Secondary | ICD-10-CM | POA: Diagnosis not present

## 2014-07-02 DIAGNOSIS — M129 Arthropathy, unspecified: Secondary | ICD-10-CM | POA: Diagnosis not present

## 2014-07-02 LAB — PROTIME-INR
INR: 1
Prothrombin Time: 13.3 secs (ref 11.5–14.7)

## 2014-07-06 ENCOUNTER — Ambulatory Visit: Payer: Self-pay | Admitting: Internal Medicine

## 2014-07-06 DIAGNOSIS — C50919 Malignant neoplasm of unspecified site of unspecified female breast: Secondary | ICD-10-CM | POA: Diagnosis not present

## 2014-07-06 DIAGNOSIS — Z9221 Personal history of antineoplastic chemotherapy: Secondary | ICD-10-CM | POA: Diagnosis not present

## 2014-07-06 DIAGNOSIS — C189 Malignant neoplasm of colon, unspecified: Secondary | ICD-10-CM | POA: Diagnosis not present

## 2014-07-06 DIAGNOSIS — Z452 Encounter for adjustment and management of vascular access device: Secondary | ICD-10-CM | POA: Diagnosis not present

## 2014-07-06 DIAGNOSIS — C7800 Secondary malignant neoplasm of unspecified lung: Secondary | ICD-10-CM | POA: Diagnosis not present

## 2014-07-06 DIAGNOSIS — Z853 Personal history of malignant neoplasm of breast: Secondary | ICD-10-CM | POA: Diagnosis not present

## 2014-07-11 ENCOUNTER — Ambulatory Visit (INDEPENDENT_AMBULATORY_CARE_PROVIDER_SITE_OTHER): Payer: Medicare Other | Admitting: Family Medicine

## 2014-07-11 DIAGNOSIS — Z9889 Other specified postprocedural states: Secondary | ICD-10-CM | POA: Diagnosis not present

## 2014-07-11 DIAGNOSIS — Z5181 Encounter for therapeutic drug level monitoring: Secondary | ICD-10-CM

## 2014-07-11 DIAGNOSIS — Z95828 Presence of other vascular implants and grafts: Secondary | ICD-10-CM

## 2014-07-11 DIAGNOSIS — I82409 Acute embolism and thrombosis of unspecified deep veins of unspecified lower extremity: Secondary | ICD-10-CM | POA: Diagnosis not present

## 2014-07-11 LAB — POCT INR: INR: 1.9

## 2014-07-22 ENCOUNTER — Other Ambulatory Visit (INDEPENDENT_AMBULATORY_CARE_PROVIDER_SITE_OTHER): Payer: Medicare Other

## 2014-07-22 DIAGNOSIS — E7800 Pure hypercholesterolemia, unspecified: Secondary | ICD-10-CM | POA: Diagnosis not present

## 2014-07-22 LAB — LIPID PANEL
CHOL/HDL RATIO: 4
Cholesterol: 130 mg/dL (ref 0–200)
HDL: 33.2 mg/dL — AB (ref >39.00)
LDL CALC: 68 mg/dL (ref 0–99)
NonHDL: 96.8
TRIGLYCERIDES: 142 mg/dL (ref 0.0–149.0)
VLDL: 28.4 mg/dL (ref 0.0–40.0)

## 2014-07-22 LAB — HEPATIC FUNCTION PANEL
ALBUMIN: 3.8 g/dL (ref 3.5–5.2)
ALK PHOS: 108 U/L (ref 39–117)
ALT: 17 U/L (ref 0–35)
AST: 22 U/L (ref 0–37)
Bilirubin, Direct: 0 mg/dL (ref 0.0–0.3)
Total Bilirubin: 0.7 mg/dL (ref 0.2–1.2)
Total Protein: 7.8 g/dL (ref 6.0–8.3)

## 2014-07-26 ENCOUNTER — Encounter: Payer: Self-pay | Admitting: Family Medicine

## 2014-07-26 ENCOUNTER — Ambulatory Visit (INDEPENDENT_AMBULATORY_CARE_PROVIDER_SITE_OTHER): Payer: Medicare Other | Admitting: Family Medicine

## 2014-07-26 VITALS — BP 110/80 | HR 74 | Temp 98.5°F | Ht 63.5 in | Wt 204.5 lb

## 2014-07-26 DIAGNOSIS — E7800 Pure hypercholesterolemia, unspecified: Secondary | ICD-10-CM | POA: Diagnosis not present

## 2014-07-26 DIAGNOSIS — E119 Type 2 diabetes mellitus without complications: Secondary | ICD-10-CM

## 2014-07-26 DIAGNOSIS — I10 Essential (primary) hypertension: Secondary | ICD-10-CM | POA: Diagnosis not present

## 2014-07-26 LAB — HM DIABETES FOOT EXAM

## 2014-07-26 NOTE — Assessment & Plan Note (Addendum)
Well controlled. Continue current medication.  

## 2014-07-26 NOTE — Assessment & Plan Note (Signed)
Improved on higher dose med.

## 2014-07-26 NOTE — Patient Instructions (Signed)
Keep up the good work

## 2014-07-26 NOTE — Progress Notes (Signed)
Pre visit review using our clinic review tool, if applicable. No additional management support is needed unless otherwise documented below in the visit note. 

## 2014-07-26 NOTE — Progress Notes (Signed)
68 year old female presents for 3 month Follow up DM. Diabetic bundle  Diabetes: well controlled on no medicaiton  Lab Results  Component Value Date   HGBA1C 6.0 04/12/2014  Using medications without difficulties:  Hypoglycemic episodes:?  Hyperglycemic episodes:?  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 Lab Results  Component Value Date   CHOL 130 07/22/2014   HDL 33.20* 07/22/2014   LDLCALC 68 07/22/2014   LDLDIRECT 145.4 11/03/2010   TRIG 142.0 07/22/2014   CHOLHDL 4 07/22/2014  Using medications without problems: None  Muscle aches: None  Diet compliance: Good  Exercise: 3 weeks out from surgery for removing metal from ankle. Pain is decreasing now. Other complaints:   Hypertension: Well controlled on avapro  Using medication without problems or lightheadedness: None  Chest pain with exertion: None  Edema:None  Short of breath:Stable.Marland Kitchen No changes  Average home BPs: At goal 120/75 Other issues:  BP Readings from Last 3 Encounters:  07/26/14 110/80  05/28/14 183/81  04/19/14 142/82    She is going to complete aromasin in next week. She has been on this in last 5 years for breast cancer. Seeing Dr. Ma Hillock  At [facility].  Review of Systems  Constitutional: Negative for fever, fatigue and unexpected weight change.  HENT:negative for ear pain and sinus pressure. Negative for congestion, sore throat, sneezing and trouble swallowing.  Eyes: Negative for pain and itching.  Respiratory: Negative for cough, chest tightness, shortness of breath and wheezing.  Cardiovascular: Negative for chest pain, palpitations and leg swelling.   Gastrointestinal: negative for constipation. Negative for nausea, abdominal pain, diarrhea and blood in stool.  Genitourinary: Negative for dysuria, hematuria, vaginal discharge, difficulty urinating and menstrual problem.  Skin: Negative for rash.  Neurological: Negative for syncope, weakness, light-headedness, numbness and headaches.  Psychiatric/Behavioral: Negative for confusion and dysphoric mood. The patient is not nervous/anxious.  Objective:   Physical Exam GEN: nad, alert and oriented  HEENT: mucous membranes moist, PERRLA  NECK: supple w/o LA  CV: rrr. no murmur  PULM: ctab, no inc wob  ABD: soft, +bs  EXT: no edema  SKIN: no acute rash  Neuro A and O x 3   Diabetic foot exam:  inspection: left ankle swollen with past injury  No skin breakdown  No calluses  Normal DP pulses  Normal sensation to light touch and monofilament  Nails normal

## 2014-07-26 NOTE — Assessment & Plan Note (Signed)
Well controlled at last check.

## 2014-07-27 ENCOUNTER — Other Ambulatory Visit: Payer: Self-pay | Admitting: Family Medicine

## 2014-07-31 ENCOUNTER — Telehealth: Payer: Self-pay | Admitting: Oncology

## 2014-07-31 NOTE — Telephone Encounter (Signed)
pt called to cx appt due to transferred care

## 2014-08-06 ENCOUNTER — Ambulatory Visit: Payer: Self-pay | Admitting: Internal Medicine

## 2014-08-08 ENCOUNTER — Ambulatory Visit (INDEPENDENT_AMBULATORY_CARE_PROVIDER_SITE_OTHER): Payer: Medicare Other | Admitting: Family Medicine

## 2014-08-08 DIAGNOSIS — Z5181 Encounter for therapeutic drug level monitoring: Secondary | ICD-10-CM

## 2014-08-08 DIAGNOSIS — I82409 Acute embolism and thrombosis of unspecified deep veins of unspecified lower extremity: Secondary | ICD-10-CM

## 2014-08-08 DIAGNOSIS — Z9889 Other specified postprocedural states: Secondary | ICD-10-CM | POA: Diagnosis not present

## 2014-08-08 DIAGNOSIS — Z95828 Presence of other vascular implants and grafts: Secondary | ICD-10-CM

## 2014-08-08 LAB — POCT INR: INR: 2.6

## 2014-08-09 DIAGNOSIS — Z9889 Other specified postprocedural states: Secondary | ICD-10-CM | POA: Diagnosis not present

## 2014-09-04 DIAGNOSIS — Z23 Encounter for immunization: Secondary | ICD-10-CM | POA: Diagnosis not present

## 2014-09-09 ENCOUNTER — Ambulatory Visit: Payer: Self-pay | Admitting: Internal Medicine

## 2014-09-09 DIAGNOSIS — C7989 Secondary malignant neoplasm of other specified sites: Secondary | ICD-10-CM | POA: Diagnosis not present

## 2014-09-09 DIAGNOSIS — C189 Malignant neoplasm of colon, unspecified: Secondary | ICD-10-CM | POA: Diagnosis not present

## 2014-09-09 DIAGNOSIS — Z853 Personal history of malignant neoplasm of breast: Secondary | ICD-10-CM | POA: Diagnosis not present

## 2014-09-09 DIAGNOSIS — Z452 Encounter for adjustment and management of vascular access device: Secondary | ICD-10-CM | POA: Diagnosis not present

## 2014-09-09 DIAGNOSIS — C7802 Secondary malignant neoplasm of left lung: Secondary | ICD-10-CM | POA: Diagnosis not present

## 2014-09-16 ENCOUNTER — Ambulatory Visit (INDEPENDENT_AMBULATORY_CARE_PROVIDER_SITE_OTHER): Payer: Medicare Other | Admitting: *Deleted

## 2014-09-16 DIAGNOSIS — Z5181 Encounter for therapeutic drug level monitoring: Secondary | ICD-10-CM

## 2014-09-16 DIAGNOSIS — Z9889 Other specified postprocedural states: Secondary | ICD-10-CM | POA: Diagnosis not present

## 2014-09-16 DIAGNOSIS — Z95828 Presence of other vascular implants and grafts: Secondary | ICD-10-CM

## 2014-09-16 LAB — POCT INR: INR: 3.1

## 2014-09-24 ENCOUNTER — Ambulatory Visit: Payer: Medicare Other | Admitting: Hematology and Oncology

## 2014-09-24 ENCOUNTER — Other Ambulatory Visit: Payer: Medicare Other

## 2014-10-06 ENCOUNTER — Ambulatory Visit: Payer: Self-pay | Admitting: Internal Medicine

## 2014-10-06 DIAGNOSIS — Z452 Encounter for adjustment and management of vascular access device: Secondary | ICD-10-CM | POA: Diagnosis not present

## 2014-10-06 DIAGNOSIS — C189 Malignant neoplasm of colon, unspecified: Secondary | ICD-10-CM | POA: Diagnosis not present

## 2014-10-16 ENCOUNTER — Other Ambulatory Visit: Payer: Self-pay | Admitting: Family Medicine

## 2014-10-22 ENCOUNTER — Encounter: Payer: Self-pay | Admitting: Family Medicine

## 2014-10-22 ENCOUNTER — Ambulatory Visit (INDEPENDENT_AMBULATORY_CARE_PROVIDER_SITE_OTHER): Payer: Medicare Other | Admitting: Family Medicine

## 2014-10-22 VITALS — BP 120/80 | HR 71 | Temp 98.1°F | Ht 63.0 in | Wt 207.5 lb

## 2014-10-22 DIAGNOSIS — E119 Type 2 diabetes mellitus without complications: Secondary | ICD-10-CM

## 2014-10-22 DIAGNOSIS — Z23 Encounter for immunization: Secondary | ICD-10-CM | POA: Diagnosis not present

## 2014-10-22 DIAGNOSIS — I10 Essential (primary) hypertension: Secondary | ICD-10-CM

## 2014-10-22 DIAGNOSIS — Z7189 Other specified counseling: Secondary | ICD-10-CM | POA: Insufficient documentation

## 2014-10-22 DIAGNOSIS — Z0000 Encounter for general adult medical examination without abnormal findings: Secondary | ICD-10-CM | POA: Diagnosis not present

## 2014-10-22 LAB — HM DIABETES FOOT EXAM

## 2014-10-22 NOTE — Progress Notes (Signed)
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  I have personally reviewed the Medicare Annual Wellness questionnaire and have noted  1. The patient's medical and social history  2. Their use of alcohol, tobacco or illicit drugs  3. Their current medications and supplements  4. The patient's functional ability including ADL's, fall risks, home safety risks and hearing or visual  impairment.  5. Diet and physical activities  6. Evidence for depression or mood disorders  The patients weight, height, BMI and visual acuity have been recorded in the chart  I have made referrals, counseling and provided education to the patient based review of the above and I have provided the pt with a written personalized care plan for preventive services.   Breast and colon cancer followed closely by Dr. Ma Hillock , Dr. Marlou Starks surgeon  Had chest, abd, pelvis stable except small nodule abdominal soft tissue.. plannned in 12/2104. Seeing every 6 month.  Diabetes: well controlled on no medicaiton  Lab Results  Component Value Date   HGBA1C 6.0 04/12/2014  Using medications without difficulties:  Hypoglycemic episodes:?  Hyperglycemic episodes:?  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 Elevated likely due to [place] trip right prior to blood. Lab Results  Component Value Date   CHOL 130 07/22/2014   HDL 33.20* 07/22/2014   LDLCALC 68 07/22/2014   LDLDIRECT 145.4 11/03/2010   TRIG 142.0 07/22/2014   CHOLHDL 4 07/22/2014                                          Using medications without problems: None  Muscle aches: None  Diet compliance: Good  Exercise: Getting back to walking, but  ankle still painful. Other complaints:   Hypertension: Well controlled on avapro  Using medication without problems or lightheadedness: None  Chest pain with exertion: None  Edema: stable Short of breath:Stable.Marland Kitchen No changes  Average home BPs: At goal 110/70  Other issues:  BP Readings from Last 3 Encounters:  10/22/14 120/80  07/26/14 110/80  05/28/14 183/81    Walking as able, diet healthy foods, water, calcium in diet.             Review of Systems  Constitutional: Negative for fever, fatigue and unexpected weight change.  HENT: Negative for congestion, sore throat, sneezing and trouble swallowing.  Eyes: Negative for pain and itching.  Respiratory: Negative for cough, chest tightness, shortness of breath and wheezing.  Cardiovascular: Negative for chest pain, palpitations and leg swelling.  Gastrointestinal: negative for constipation. Negative for nausea, abdominal pain, diarrhea and blood in stool.  Genitourinary: Negative for dysuria, hematuria, vaginal discharge, difficulty urinating and menstrual problem.  Skin: Negative for rash.  Neurological: Negative for syncope, weakness, light-headedness, numbness and headaches.  Psychiatric/Behavioral: Negative for confusion and dysphoric mood. The patient is not nervous/anxious.  Objective:   Physical Exam GEN: nad, alert and oriented HEENT: mucous membranes moist, PERRLA NECK: supple w/o LA CV: rrr. no murmur PULM: ctab, no inc wob ABD: soft, +bs EXT: no edema SKIN: no acute rash GYN: ext nml, no adnexal ttp, no uterine or ovarian  enlargement, NO PAP PERFORMED Breast exam: scar on left upper breast, firm some nodules associated not mobile.. Right breast nml, neg axillary lymph nodes bilaterally. Assessment & Plan:   The patient's preventative maintenance and recommended screening tests for an annual wellness exam were reviewed in full today.  Brought up to date unless services declined.   Counselled on the importance of diet, exercise, and its role in overall health and mortality.  The patient's FH and SH was reviewed, including their home life, tobacco status, and drug and alcohol status.   Vaccines: Considering shingles, has recieved flu at pharm.  Mammo: Hx of breast cancer.. Per onc, 02/2014 stable, breast exam at onc as well. Bone density:02/2013 osteopenia Colon: history of stage 4 colon cancer... Per GI Dr. Collene Mares, colonoscopy 02/2013 Negative.. Repeat in 3-5 years. PAP/DVE: DVE, pap not indicated, no symptoms.

## 2014-10-22 NOTE — Progress Notes (Signed)
Pre visit review using our clinic review tool, if applicable. No additional management support is needed unless otherwise documented below in the visit note. 

## 2014-10-22 NOTE — Assessment & Plan Note (Signed)
Well controlled. Continue current medication.  

## 2014-10-22 NOTE — Patient Instructions (Signed)
Bring in copy of living will.

## 2014-10-22 NOTE — Assessment & Plan Note (Signed)
Well controlled with diet.

## 2014-10-22 NOTE — Addendum Note (Signed)
Addended by: Carter Kitten on: 10/22/2014 11:33 AM   Modules accepted: Orders

## 2014-10-28 ENCOUNTER — Telehealth: Payer: Self-pay | Admitting: Family Medicine

## 2014-10-28 ENCOUNTER — Other Ambulatory Visit (INDEPENDENT_AMBULATORY_CARE_PROVIDER_SITE_OTHER): Payer: Medicare Other

## 2014-10-28 DIAGNOSIS — Z5181 Encounter for therapeutic drug level monitoring: Secondary | ICD-10-CM

## 2014-10-28 LAB — PROTIME-INR
INR: 3.1 ratio — ABNORMAL HIGH (ref 0.8–1.0)
Prothrombin Time: 33 s — ABNORMAL HIGH (ref 9.6–13.1)

## 2014-10-28 NOTE — Telephone Encounter (Signed)
Have her change regimen to 2.5 mg daily except 5 mg on fridays.  Return for recheck in 2 weeks.

## 2014-10-28 NOTE — Telephone Encounter (Signed)
Spoke with Susan Duffy.  She answered no to all the question below.  She confirmed her previous regimen on held coumadin on 10/12, then continued 5 mg Tues/Fri, 2.5 mg all other days.

## 2014-10-28 NOTE — Telephone Encounter (Signed)
INR 3.1 Goal 2-3 Please ask patient below questions   Also verify the regimen that patient is taking.  Coumadin dose missed/changed:   Abnormal Bleeding Symptoms:  Any diet changes including alcohol intake, vegetables or greens since the last visit:   Any illnesses or hospitalizations or antibiotics since the last visit:   Any signs of clotting since the last visit:  Previous INR:10/12 3.1 Previous Regimen: Held coumadin 10/12 Then continued to take 2.5 mg all days except 5 mg on Tues/Fri.   Once info reviewed I will make recs.

## 2014-10-29 NOTE — Telephone Encounter (Signed)
Ms. Lacko notified to take her warfarin 2.5mg  every day except 5 mg on Fridays.  Recheck PT/INR in 2 weeks.  Lab appointment scheduled 11/12/2014 @ 10:45am.

## 2014-11-05 ENCOUNTER — Ambulatory Visit: Payer: Self-pay | Admitting: Internal Medicine

## 2014-11-12 ENCOUNTER — Other Ambulatory Visit (INDEPENDENT_AMBULATORY_CARE_PROVIDER_SITE_OTHER): Payer: Medicare Other

## 2014-11-12 DIAGNOSIS — Z5181 Encounter for therapeutic drug level monitoring: Secondary | ICD-10-CM

## 2014-12-12 ENCOUNTER — Ambulatory Visit (INDEPENDENT_AMBULATORY_CARE_PROVIDER_SITE_OTHER): Payer: Medicare Other | Admitting: Family Medicine

## 2014-12-12 DIAGNOSIS — Z5181 Encounter for therapeutic drug level monitoring: Secondary | ICD-10-CM | POA: Diagnosis not present

## 2014-12-12 DIAGNOSIS — Z9889 Other specified postprocedural states: Secondary | ICD-10-CM | POA: Diagnosis not present

## 2014-12-12 DIAGNOSIS — Z95828 Presence of other vascular implants and grafts: Secondary | ICD-10-CM

## 2014-12-12 LAB — POCT INR: INR: 3.7

## 2014-12-31 ENCOUNTER — Ambulatory Visit: Payer: Self-pay | Admitting: Internal Medicine

## 2014-12-31 DIAGNOSIS — C7989 Secondary malignant neoplasm of other specified sites: Secondary | ICD-10-CM | POA: Diagnosis not present

## 2014-12-31 DIAGNOSIS — Z853 Personal history of malignant neoplasm of breast: Secondary | ICD-10-CM | POA: Diagnosis not present

## 2014-12-31 DIAGNOSIS — C7800 Secondary malignant neoplasm of unspecified lung: Secondary | ICD-10-CM | POA: Diagnosis not present

## 2014-12-31 DIAGNOSIS — C189 Malignant neoplasm of colon, unspecified: Secondary | ICD-10-CM | POA: Diagnosis not present

## 2014-12-31 LAB — CBC CANCER CENTER
BASOS ABS: 0.1 x10 3/mm (ref 0.0–0.1)
Basophil %: 0.7 %
EOS ABS: 0.2 x10 3/mm (ref 0.0–0.7)
Eosinophil %: 2.2 %
HCT: 39.8 % (ref 35.0–47.0)
HGB: 13.7 g/dL (ref 12.0–16.0)
LYMPHS ABS: 1.4 x10 3/mm (ref 1.0–3.6)
Lymphocyte %: 17.6 %
MCH: 32.3 pg (ref 26.0–34.0)
MCHC: 34.4 g/dL (ref 32.0–36.0)
MCV: 94 fL (ref 80–100)
MONO ABS: 0.7 x10 3/mm (ref 0.2–0.9)
Monocyte %: 8.4 %
NEUTROS ABS: 5.5 x10 3/mm (ref 1.4–6.5)
Neutrophil %: 71.1 %
PLATELETS: 253 x10 3/mm (ref 150–440)
RBC: 4.23 10*6/uL (ref 3.80–5.20)
RDW: 12.6 % (ref 11.5–14.5)
WBC: 7.8 x10 3/mm (ref 3.6–11.0)

## 2014-12-31 LAB — HEPATIC FUNCTION PANEL A (ARMC)
ALBUMIN: 3.3 g/dL — AB (ref 3.4–5.0)
AST: 24 U/L (ref 15–37)
Alkaline Phosphatase: 135 U/L — ABNORMAL HIGH (ref 46–116)
Bilirubin, Direct: 0.1 mg/dL (ref 0.0–0.2)
Bilirubin,Total: 0.4 mg/dL (ref 0.2–1.0)
SGPT (ALT): 28 U/L (ref 14–63)
Total Protein: 7.9 g/dL (ref 6.4–8.2)

## 2014-12-31 LAB — CREATININE, SERUM
CREATININE: 1.04 mg/dL (ref 0.60–1.30)
EGFR (Non-African Amer.): 56 — ABNORMAL LOW

## 2015-01-01 LAB — CEA: CEA: 1.2 ng/mL (ref 0.0–4.7)

## 2015-01-02 ENCOUNTER — Ambulatory Visit: Payer: Self-pay | Admitting: Internal Medicine

## 2015-01-02 DIAGNOSIS — Z08 Encounter for follow-up examination after completed treatment for malignant neoplasm: Secondary | ICD-10-CM | POA: Diagnosis not present

## 2015-01-02 DIAGNOSIS — Z85038 Personal history of other malignant neoplasm of large intestine: Secondary | ICD-10-CM | POA: Diagnosis not present

## 2015-01-02 DIAGNOSIS — R934 Abnormal findings on diagnostic imaging of urinary organs: Secondary | ICD-10-CM | POA: Diagnosis not present

## 2015-01-02 DIAGNOSIS — C7800 Secondary malignant neoplasm of unspecified lung: Secondary | ICD-10-CM | POA: Diagnosis not present

## 2015-01-02 DIAGNOSIS — N858 Other specified noninflammatory disorders of uterus: Secondary | ICD-10-CM | POA: Diagnosis not present

## 2015-01-02 DIAGNOSIS — C189 Malignant neoplasm of colon, unspecified: Secondary | ICD-10-CM | POA: Diagnosis not present

## 2015-01-06 ENCOUNTER — Ambulatory Visit: Payer: Self-pay | Admitting: Internal Medicine

## 2015-01-06 DIAGNOSIS — C189 Malignant neoplasm of colon, unspecified: Secondary | ICD-10-CM | POA: Diagnosis not present

## 2015-01-06 DIAGNOSIS — Z7982 Long term (current) use of aspirin: Secondary | ICD-10-CM | POA: Diagnosis not present

## 2015-01-06 DIAGNOSIS — Z853 Personal history of malignant neoplasm of breast: Secondary | ICD-10-CM | POA: Diagnosis not present

## 2015-01-06 DIAGNOSIS — Z86718 Personal history of other venous thrombosis and embolism: Secondary | ICD-10-CM | POA: Diagnosis not present

## 2015-01-06 DIAGNOSIS — Z7901 Long term (current) use of anticoagulants: Secondary | ICD-10-CM | POA: Diagnosis not present

## 2015-01-06 DIAGNOSIS — I10 Essential (primary) hypertension: Secondary | ICD-10-CM | POA: Diagnosis not present

## 2015-01-06 DIAGNOSIS — G620 Drug-induced polyneuropathy: Secondary | ICD-10-CM | POA: Diagnosis not present

## 2015-01-06 DIAGNOSIS — Z79899 Other long term (current) drug therapy: Secondary | ICD-10-CM | POA: Diagnosis not present

## 2015-01-06 DIAGNOSIS — E785 Hyperlipidemia, unspecified: Secondary | ICD-10-CM | POA: Diagnosis not present

## 2015-01-06 DIAGNOSIS — Z9223 Personal history of estrogen therapy: Secondary | ICD-10-CM | POA: Diagnosis not present

## 2015-01-06 DIAGNOSIS — C7800 Secondary malignant neoplasm of unspecified lung: Secondary | ICD-10-CM | POA: Diagnosis not present

## 2015-01-09 ENCOUNTER — Other Ambulatory Visit (INDEPENDENT_AMBULATORY_CARE_PROVIDER_SITE_OTHER): Payer: Medicare Other

## 2015-01-09 DIAGNOSIS — Z5181 Encounter for therapeutic drug level monitoring: Secondary | ICD-10-CM | POA: Diagnosis not present

## 2015-01-09 LAB — POCT INR: INR: 2.7

## 2015-02-04 ENCOUNTER — Ambulatory Visit
Admit: 2015-02-04 | Disposition: A | Discharge status: Home / Self Care | Payer: Self-pay | Attending: Internal Medicine | Admitting: Internal Medicine

## 2015-02-04 DIAGNOSIS — Z853 Personal history of malignant neoplasm of breast: Secondary | ICD-10-CM | POA: Diagnosis not present

## 2015-02-04 DIAGNOSIS — Z86718 Personal history of other venous thrombosis and embolism: Secondary | ICD-10-CM | POA: Diagnosis not present

## 2015-02-04 DIAGNOSIS — Z452 Encounter for adjustment and management of vascular access device: Secondary | ICD-10-CM | POA: Diagnosis not present

## 2015-02-04 DIAGNOSIS — C189 Malignant neoplasm of colon, unspecified: Secondary | ICD-10-CM | POA: Diagnosis not present

## 2015-02-04 DIAGNOSIS — C7800 Secondary malignant neoplasm of unspecified lung: Secondary | ICD-10-CM | POA: Diagnosis not present

## 2015-02-04 DIAGNOSIS — Z7901 Long term (current) use of anticoagulants: Secondary | ICD-10-CM | POA: Diagnosis not present

## 2015-02-04 DIAGNOSIS — Z9223 Personal history of estrogen therapy: Secondary | ICD-10-CM | POA: Diagnosis not present

## 2015-02-06 ENCOUNTER — Other Ambulatory Visit (INDEPENDENT_AMBULATORY_CARE_PROVIDER_SITE_OTHER): Payer: Medicare Other

## 2015-02-06 DIAGNOSIS — Z5181 Encounter for therapeutic drug level monitoring: Secondary | ICD-10-CM

## 2015-02-06 LAB — POCT INR: INR: 2.6

## 2015-02-10 DIAGNOSIS — C189 Malignant neoplasm of colon, unspecified: Secondary | ICD-10-CM | POA: Diagnosis not present

## 2015-02-10 DIAGNOSIS — Z86718 Personal history of other venous thrombosis and embolism: Secondary | ICD-10-CM | POA: Diagnosis not present

## 2015-02-10 DIAGNOSIS — C7800 Secondary malignant neoplasm of unspecified lung: Secondary | ICD-10-CM | POA: Diagnosis not present

## 2015-02-10 DIAGNOSIS — Z452 Encounter for adjustment and management of vascular access device: Secondary | ICD-10-CM | POA: Diagnosis not present

## 2015-02-10 DIAGNOSIS — Z853 Personal history of malignant neoplasm of breast: Secondary | ICD-10-CM | POA: Diagnosis not present

## 2015-02-10 DIAGNOSIS — Z9223 Personal history of estrogen therapy: Secondary | ICD-10-CM | POA: Diagnosis not present

## 2015-03-03 DIAGNOSIS — Z452 Encounter for adjustment and management of vascular access device: Secondary | ICD-10-CM | POA: Diagnosis not present

## 2015-03-03 DIAGNOSIS — C189 Malignant neoplasm of colon, unspecified: Secondary | ICD-10-CM | POA: Diagnosis not present

## 2015-03-03 DIAGNOSIS — Z9223 Personal history of estrogen therapy: Secondary | ICD-10-CM | POA: Diagnosis not present

## 2015-03-03 DIAGNOSIS — C7800 Secondary malignant neoplasm of unspecified lung: Secondary | ICD-10-CM | POA: Diagnosis not present

## 2015-03-03 DIAGNOSIS — Z86718 Personal history of other venous thrombosis and embolism: Secondary | ICD-10-CM | POA: Diagnosis not present

## 2015-03-03 DIAGNOSIS — R928 Other abnormal and inconclusive findings on diagnostic imaging of breast: Secondary | ICD-10-CM | POA: Diagnosis not present

## 2015-03-03 DIAGNOSIS — Z853 Personal history of malignant neoplasm of breast: Secondary | ICD-10-CM | POA: Diagnosis not present

## 2015-03-06 ENCOUNTER — Other Ambulatory Visit (INDEPENDENT_AMBULATORY_CARE_PROVIDER_SITE_OTHER): Payer: Medicare Other

## 2015-03-06 DIAGNOSIS — Z5181 Encounter for therapeutic drug level monitoring: Secondary | ICD-10-CM

## 2015-03-06 LAB — POCT INR: INR: 3

## 2015-03-07 ENCOUNTER — Ambulatory Visit
Admit: 2015-03-07 | Disposition: A | Discharge status: Home / Self Care | Payer: Self-pay | Attending: Internal Medicine | Admitting: Internal Medicine

## 2015-03-07 DIAGNOSIS — Z86718 Personal history of other venous thrombosis and embolism: Secondary | ICD-10-CM | POA: Diagnosis not present

## 2015-03-07 DIAGNOSIS — Z452 Encounter for adjustment and management of vascular access device: Secondary | ICD-10-CM | POA: Diagnosis not present

## 2015-03-07 DIAGNOSIS — Z853 Personal history of malignant neoplasm of breast: Secondary | ICD-10-CM | POA: Diagnosis not present

## 2015-03-07 DIAGNOSIS — Z9223 Personal history of estrogen therapy: Secondary | ICD-10-CM | POA: Diagnosis not present

## 2015-03-07 DIAGNOSIS — C189 Malignant neoplasm of colon, unspecified: Secondary | ICD-10-CM | POA: Diagnosis not present

## 2015-03-07 DIAGNOSIS — C7800 Secondary malignant neoplasm of unspecified lung: Secondary | ICD-10-CM | POA: Diagnosis not present

## 2015-03-07 DIAGNOSIS — Z7901 Long term (current) use of anticoagulants: Secondary | ICD-10-CM | POA: Diagnosis not present

## 2015-03-24 DIAGNOSIS — C7800 Secondary malignant neoplasm of unspecified lung: Secondary | ICD-10-CM | POA: Diagnosis not present

## 2015-03-24 DIAGNOSIS — Z86718 Personal history of other venous thrombosis and embolism: Secondary | ICD-10-CM | POA: Diagnosis not present

## 2015-03-24 DIAGNOSIS — Z853 Personal history of malignant neoplasm of breast: Secondary | ICD-10-CM | POA: Diagnosis not present

## 2015-03-24 DIAGNOSIS — C189 Malignant neoplasm of colon, unspecified: Secondary | ICD-10-CM | POA: Diagnosis not present

## 2015-03-24 DIAGNOSIS — Z452 Encounter for adjustment and management of vascular access device: Secondary | ICD-10-CM | POA: Diagnosis not present

## 2015-03-24 DIAGNOSIS — Z9223 Personal history of estrogen therapy: Secondary | ICD-10-CM | POA: Diagnosis not present

## 2015-03-25 NOTE — H&P (Signed)
PATIENT NAME:  Susan Duffy, SPRING MR#:  [id number] DATE OF BIRTH:  1946/09/23  DATE OF ADMISSION:  08/25/2012  CHIEF COMPLAINT: Severe left ankle pain.   HISTORY OF PRESENT ILLNESS: The patient is active 69 year old who had gone out to walk her dog. She slipped at home on wet grass and suffered a severe deformity to the ankle. She was brought to the Emergency Room and found to have a left ankle fracture-dislocation that is very unstable and she is being admitted for treatment of this. She has previously been ambulatory without assistive device. She also has complicating factor of history of deep vein thrombosis in the left leg in 2010 and has been on chronic Coumadin for this. She also is currently undergoing treatment for recurrent colon cancer.   PAST MEDICAL HISTORY:  1. History of deep vein thrombosis.  2. High cholesterol.  3. Hypertension. 4. Breast cancer history.  5. Colon cancer with resection and recurrence in the chest with partial left pneumonectomy. She is currently having chemotherapy but is in between treatments of this.   SOCIAL HISTORY: She is a nondrinker, nonsmoker, lives with her husband.   FAMILY HISTORY: Positive for lung cancer and diabetes.   ALLERGIES: Amoxicillin causing diarrhea and erythromycin causing nausea, vomiting and diarrhea.   MEDICATIONS ON ADMISSION:  1. Aspirin 81 mg daily.  2. Coumadin 5 mg daily.  3. Exemestane 25 mg daily.  4. Irbesartan 150 daily. 5. Simvastatin 20 mg daily.  6. Vitamin B12 1 tablet daily.   REVIEW OF SYSTEMS: Negative for chest pain, loss of consciousness or shortness of breath. Otherwise negative.   PHYSICAL EXAMINATION:  GENERAL: Well developed, well nourished white female who appears her stated age in a great deal of distress secondary to ankle pain.   HEENT: Normocephalic, atraumatic.   NECK: Nontender.   HEENT: Normal.   LUNGS: Clear.   HEART: Regular rate and rhythm with no murmur noted.   ABDOMEN: Soft and  nontender.   EXTREMITIES: Pertinent to the left lower extremity. She has abrasion over the medial ankle at the level of the medial malleolar fracture. She does have sensation intact pre- and postreduction. X-rays reveal a trimalleolar ankle fracture-dislocation. This was reduced in the Emergency Room and a splint applied. Postreduction x-ray showed some persistent subluxation, but overall acceptable alignment.   PLAN: We will obtain medical consultation. With her history of deep vein thrombosis, I think she is a high risk for perioperative blood clot and will probably need full anticoagulation until we get her ProTime down so she can have surgery safely and then get her back on her Coumadin and anticoagulate. We will plan on surgery when her Coumadin is at a level where this should be safe. The risks, benefits and possible complications of the surgery were discussed with the patient, in particular infection and wound healing, particularly with the elevated ProTime.  ____________________________ Laurene Footman, MD mjm:ap D: 08/26/2012 12:57:38 ET T: 08/26/2012 13:55:57 ET JOB#: [id number] cc: Laurene Footman, MD, <Dictator> Laurene Footman MD ELECTRONICALLY SIGNED 08/27/2012 19:17

## 2015-03-25 NOTE — Consult Note (Signed)
Brief Consult Note: Diagnosis: left ankle fx, dvt, htn.   Patient was seen by consultant.   Consult note dictated.   Recommend to proceed with surgery or procedure.   Recommend further assessment or treatment.   Orders entered.   Discussed with Attending MD.   Comments: 69 yo with breast ca, colon ca sp resection and recurrence inlung s/p left partial pneumonectmy, dvt on coumadin sp fall and left ankle fx. no cp/sob. can walk up to a block w/t cp. no mi/renal failure or active cardiac symptoms.  no ekg in chart. check ekg and if no sig arrythnmias, STchanges proceed w/t further cardiac workup. going to OR on monday. not on BB.  hold arb on day of surg(monday). pt given vit k for the inr. will start heparin gtt.  recommend spinal anesthesia if possible given partial lung resection and will order incentive spirometry..  Electronic Signatures: Vivien Presto (MD)  (Signed 20-Sep-13 15:08)  Authored: Brief Consult Note   Last Updated: 20-Sep-13 15:08 by Vivien Presto (MD)

## 2015-03-25 NOTE — Op Note (Signed)
PATIENT NAME:  Susan Duffy, Susan Duffy MR#:  [id number] DATE OF BIRTH:  Sep 26, 1946  DATE OF PROCEDURE:  08/28/2012  PREOPERATIVE DIAGNOSIS: Left ankle trimalleolar fracture-dislocation.   POSTOPERATIVE DIAGNOSIS: Left ankle trimalleolar fracture-dislocation.  PROCEDURE: ORIF medial and lateral malleoli, left ankle.   ANESTHESIA: General.   SURGEON: Laurene Footman, MD    DESCRIPTION OF PROCEDURE: The patient was brought to the operating room and after adequate anesthesia was obtained the left leg was prepped and draped in the usual sterile fashion with a large bump underneath the left buttock to allow for internal rotation of the leg in essentially posterolateral approach to the ankle. After prepping and draping the leg in the usual sterile fashion with a tourniquet applied to the upper thigh, the tourniquet was raised to 300 mmHg. There was a large fracture blister medially that was debrided. Going laterally, posterolateral approach was made in case a posterior malleolar fragment needed to be repaired. Inspection of the lateral malleolus showed a very comminuted distal fibula fracture. The peroneal tendons were elevated off the posterior aspect of the distal fibula and a 10 hole composite fibular plate from the Biomet trauma set was obtained. After contouring this to fit the fracture after getting essentially anatomic reduction, the screw holes were filled, proximal screws with cortical screws, nonlocking, and distally with locking screws. On C-arm views this appeared to give anatomic alignment to the fibula. Next, the anteromedial approach was carried out for exposure of the medial malleolar fragment which on reduction was held in place and two K wires inserted for cannulated screws over drilling and placing two 36 mm partially threaded cancellus screws was carried out using the Biomet system, formerly DePuy, and on AP and lateral imaging as well as mortise views the alignment appeared anatomic with  stress views. There did appear that there might be some opening of the mortise and so one of the screws from the lateral plate was changed out to a syndesmotic screw after drilling through the tibia. There did not appear to be any posterior subluxation at the tibiotalar joint with posterior stress and the posterior malleolar fragment was relatively small and did not require reduction and fixation. At this point the wounds were thoroughly irrigated and closed with 3-0 Vicryl subcutaneously and skin staples. A Mepitel and Wound VAC was then applied secondary to the patient's need for postop anticoagulation. Webril and splints were then placed followed by an Ace wrap and the tourniquet let down. Hemostasis was minimal.   COMPLICATIONS: None.   SPECIMEN: None.   IMPLANTS: Biomet/DePuy composite lateral plate with multiple locking and nonlocking cortical screws, medial malleolus two partially threaded 36 mm cancellus cannulated screws from the same system.   TOURNIQUET TIME: 100 minutes at 300 mmHg.   CONDITION: To recovery room stable.   ____________________________ Laurene Footman, MD mjm:drc D: 08/28/2012 23:33:49 ET T: 08/29/2012 10:11:37 ET JOB#: [id number]  cc: Laurene Footman, MD, <Dictator> Laurene Footman MD ELECTRONICALLY SIGNED 08/29/2012 10:34

## 2015-03-25 NOTE — Consult Note (Signed)
PATIENT NAME:  Susan Duffy, Susan Duffy MR#:  [id number] DATE OF BIRTH:  [birth year]  DATE OF CONSULTATION:  08/25/2012  REFERRING PHYSICIAN:  Hessie Knows, MD  from [place]:  Vivien Presto, MD PRIMARY ONCOLOGIST: Dr. Humphrey Rolls at [facility]: Preop evaluation and medical management.   HISTORY OF PRESENT ILLNESS: The patient is a pleasant 69 year old Caucasian female with a history of hypertension, breast cancer, status post lumpectomy and chemotherapy which the patient states has been cured, and also colon cancer in 2005, status post colon surgery and recurrence of the cancer in 2010 in the lung, status post left partial pneumonectomy, who is no longer on chemotherapy for the cancer as there is also a spot attached to the chest wall secondary to side effects of chemotherapy, presents after a fall sustaining a left ankle fracture. Medicine was consulted for preop evaluation. The patient did develop a deep vein thrombosis in 2010. She has only one episode of venothrombotic event but possibly in the setting of cancer. Therefore, she is on Coumadin. She denies having any chest pain, shortness of breath. The fall was mechanical as she was going uphill and tripped over wet grass as it was raining today, falling backwards and sustaining a left ankle fracture. There was no loss of consciousness. There is no EKG in the chart of note.   PAST MEDICAL HISTORY:  1. 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  2. Hypercholesterolemia.  3. Hypertension.  4. Breast cancer, status post lumpectomy.  5. Colon cancer, status post resection and then recurrence five years later in the chest, status post left partial pneumonectomy with a residual tumor burden in the chest wall, per patient.   SOCIAL HISTORY: No tobacco, alcohol or drug use.   FAMILY HISTORY: Dad with lung cancer. Mom with congestive heart failure. History of diabetes in the family.    ALLERGIES: Amoxicillin and erythromycin causing nausea, vomiting, and diarrhea.    MEDICATIONS:  1. Aspirin 81 mg daily.  2. Exemestane 25 mg once a day after a meal.  3. Irbesartan 150 mg daily.  4. Simvastatin 20 mg daily.  5. Vitamin B12, 1 tab daily.  6. Warfarin 5 mg at bedtime.    REVIEW OF SYSTEMS: CONSTITUTIONAL: No fever, fatigue, weight changes. EYES: No blurry vision or double vision. ENT: No tinnitus or hearing loss. RESPIRATORY: No cough, wheezing, chronic obstructive pulmonary disease or hemoptysis. Status post partial left lung resection. CARDIOVASCULAR: No chest pain, orthopnea, or edema. No arrhythmia or syncope. No history of myocardial infarction or congestive heart failure. GI: No nausea, vomiting, diarrhea, or abdominal pain. No black or bloody stools. No tarry stools. GENITOURINARY: Denies dysuria, hematuria, frequency. ENDOCRINE: Denies polyuria, nocturia, or thyroid problems. HEME/LYMPH: Denies easy bruising or any anemia. SKIN: Denies any new rashes. MUSCULOSKELETAL: Left ankle pain. NEUROLOGICAL: Denies numbness, weakness, or dementia. PSYCHIATRIC: No anxiety or insomnia.   PHYSICAL EXAMINATION:  VITAL SIGNS: Temperature on arrival 98.6, pulse 92, respiratory rate 18, blood pressure 166/90. Oxygen saturations are 95% on room air.   GENERAL: The patient is an obese Caucasian female lying in bed in no obvious distress.   HEENT: Head: Normocephalic atraumatic. Pupils are equal, round and reactive to light and accommodation. Extraocular movements are intact. Sclerae are anicteric.  Moist mucous membranes.   NECK: Supple. No thyroid tenderness. No lymphadenopathy.   CARDIOVASCULAR: S1, S2, regular rhythm. No murmurs, rubs, or gallops.   LUNGS: Clear to auscultation without wheezing or rhonchi.  ABDOMEN: Soft, nontender, nondistended. Positive bowel sounds in all extremities. No organomegaly appreciated.   EXTREMITIES: No significant lower extremity edema. The  left lower extremity is in a soft cast.  NEUROLOGICAL:   Cranial nerves II through XII are grossly intact.  Moving all extremities.   PSYCHIATRIC: Awake, alert, oriented x3. Pleasant, cooperative, conversant.   LABORATORY, DIAGNOSTIC AND RADIOLOGICAL DATA: BUN 22, creatinine 1.03, glucose 121, potassium 3.7, chloride 102. WBC 12.4, hemoglobin 14.4, platelets are 225. INR is 2.4. X-ray of left ankle, complete, showing angulated displaced bimalleolar fracture disruption of the ankle joint mortise. Chest, one view PA and lateral, showing no evidence of acute cardiopulmonary abnormality. I did not see an EKG in the chart.   ASSESSMENT AND PLAN: We have a pleasant 70 year old Caucasian female with a history of breast cancer, colon cancer, hypertension, hyperlipidemia. and deep vein thrombosis in 2010, probably in the setting of cancer for which she is on Coumadin with INR of 2.4, status post fall sustaining a left ankle fracture, and Medicine was consulted for preop evaluation. The patient has no chest pains, shortness of breath, and the fall was mechanical in nature. She has no history of myocardial infarction or congestive heart failure either, nor is she diabetic. She has no active cardiac conditions symptoms-wise,  however, there is no EKG in the chart. If there is no significant arrhythmias or ST changes, I would not recommend further cardiac work-up as the patient is asymptomatic. In regards to the history of deep venous thrombosis, I would start the patient on heparin drip. INR is 2.4, and the patient has been given vitamin K. The deep vein thrombosis appears to be in the setting of cancer for which she would likely need lifelong Coumadin unless the cancer completely is cured. I would hold the heparin drip about 6 hours before the surgery which is scheduled on Friday per Dr. Rudene Christians, with whom I discussed the case. Post surgery, it could be resumed once the patient is at a minimal bleeding risk and the  hemoglobin is shown to be stable. The patient is not on a beta blocker. She is on ARB, which I would continue at this point; and it should be held on the day of surgery and the day after surgery when her creatinine is shown to be normal. I would continue the simvastatin. The patient has been given vitamin K x1, and we should check the INR tomorrow. She does have mild hyperglycemia with sugar of 121, and therefore I would check a hemoglobin A1c.  The case was discussed with Dr. Rudene Christians   CODE STATUS:  FULL CODE.     TIME SPENT:   55 minutes.  ____________________________ Vivien Presto, MD sa:cbb D: 08/25/2012 15:06:55 ET T: 08/25/2012 16:42:54 ET JOB#: [id number] Vivien Presto MD ELECTRONICALLY SIGNED 09/03/2012 17:46

## 2015-03-25 NOTE — Discharge Summary (Signed)
PATIENT NAME:  Susan Duffy, Susan Duffy MR#:  [id number] DATE OF BIRTH:  1946/10/20  DATE OF ADMISSION:  08/25/2012 DATE OF DISCHARGE:  08/30/2012  ADMITTING DIAGNOSIS: Left ankle trauma trimalleolar fracture-dislocation.   DISCHARGE DIAGNOSIS: Left ankle trauma trimalleolar fracture-dislocation.   PROCEDURE: Open reduction and internal fixation of medial and lateral malleoli, left ankle.   ANESTHESIA: General.   SURGEON: Laurene Footman, M.D.   COMPLICATIONS: None.   SPECIMEN: None.   IMPLANTS: [employer] composite lateral plate with multiple locking and nonlocking cortical screws, medial malleolus two partially threaded 36 mm cancellus cannulated screws from the same system.   TOURNIQUET TIME: 100 minutes at 300 mmHg.   CONDITION: To recovery room stable.   HISTORY: The patient is an active 69 year old who had gone out to walk her dog. She slipped at home on wet grass and suffered a severe deformity to the ankle. She was brought to the ER and found to have a left ankle fracture-dislocation that is very unstable and she was admitted for treatment of this. She has previously been ambulatory without any assistive device. She also has complicating factor of history of deep vein thrombosis in the left leg in 2010 and has been on chronic Coumadin for this. She also is undergoing treatment for recurrent colon cancer.   PHYSICAL EXAMINATION: GENERAL: Well developed, well-nourished white female who appears her stated age, in a great deal of distress secondary to ankle pain. HEAD: Normocephalic, atraumatic. NECK: Nontender. HEENT: Normal. LUNGS: Clear. HEART: Regular rate and rhythm with no murmur noted. ABDOMEN: Soft and nontender. EXTREMITIES: Pertinent to the left lower extremity, she has an abrasion over the medial ankle and at the level of the medial malleolar fracture. She does have sensation intact pre and postreduction. X-rays reveal a trimalleolar ankle fracture-dislocation. This was reduced  in the Emergency Room and a splint applied. Post reduction x-ray showed some persistent subluxation but overall stable alignment.   HOSPITAL COURSE: The patient was admitted to the hospital on 08/25/2012 due to her history of deep vein thrombosis and being treated with chronic Coumadin. We waited for PT/INR levels to drop. On 08/28/2012, the patient had surgery and was brought to the orthopedic floor from the PAC-U unit in stable condition. By 08/30/2012, the patient was ready for discharge to home with home health PT. INR level was up to 2 to 3 and she was back on her normal Coumadin regimen. The patient's pain was controlled and she had good progress with physical therapy.   DISCHARGE INSTRUCTIONS: The patient should not put any weight on the affected extremity, elevate the affected foot and leg on 1 or 2 pillows with the foot higher than the knee. She may resume a regular diet as tolerated. Resume typical home medications. Pain medications should consist of Vicodin 1 to 2 tablets every four hours as needed for pain. Apply an ice pack to affected area. Do not get the dressing or bandage wet or dirty. Call [facility] if the dressing gets water under it. Leave the dressing on and do not place any objects under the cast. Remove staples and apply benzoin and Steri-Strips in two weeks at [facility]. Call [facility] if any of the following occur: Bright red bleeding from the incision wound, fever above 101.5 degrees, redness, swelling, or drainage at the incision. Call [facility] if you experience any increased leg pain, numbness or weakness in your legs or bowel or bladder symptoms. Home physical therapy has been arranged for  the patient. She should be contacted within 48 hours. If not she should give [place] a call. Home health nurse should also get PT/INR on Friday, 09/01/2012, and send results to Dr. [name] at [facility].  Follow-up appointment - she may be seen at [facility] office in two weeks. Call [facility] to schedule an appointment.   DISCHARGE MEDICATIONS:  1. Aspirin 81 mg oral tablet 1 tablet orally once a day. 2. Irbesartan 150 mg oral tablet 1 tablet orally once a day. 3. Exemestane 25 mg oral tablet 1 tablet orally once a day after a meal.  4. Warfarin 5 mg oral tablet 1 tablet orally once a day at bedtime.     5. Simvastatin 20 mg oral tablet 1 tablet orally once a day at bedtime.  6. Vitamin B12 1 tablet orally once a day   ____________________________ Duanne Guess, PA-C tcg:slb D: 08/31/2012 13:02:15 ET T: 09/01/2012 11:19:50 ET JOB#: [id number]  cc: Duanne Guess, PA-C, <Dictator> Duanne Guess [place] ELECTRONICALLY SIGNED 09/05/2012 13:49

## 2015-03-29 NOTE — Op Note (Signed)
PATIENT NAME:  Susan Duffy, Susan Duffy MR#:  [id number] DATE OF BIRTH:  August 27, 1946  DATE OF PROCEDURE:  07/02/2014  PREOPERATIVE DIAGNOSIS: Painful hardware, left ankle.   POSTOPERATIVE DIAGNOSIS: Painful hardware, left ankle.   PROCEDURE: Left ankle hardware removal, medial and lateral ankle.   ANESTHESIA: General.   SURGEON: Laurene Footman, M.D.   DESCRIPTION OF PROCEDURE: The patient was brought to the operating room, and after adequate anesthesia was obtained, the left leg was prepped and draped in the usual sterile fashion with appropriate patient identification and timeout procedure completed. With a bump underneath the left buttock, the tourniquet was raised to 300 mmHg and the prior lateral incision was opened. The subcutaneous tissue was spread and the prior plate was exposed, and all screws removed without difficulty. The plate was removed. There had been some bony ingrowth into the screw holes more superiorly. This was debrided with the use of a rongeur. The wound was thoroughly irrigated. The bone was entirely healed. The wound was closed with 2-0 Vicryl subcutaneously and the skin with staples.   Going medially, the prior distal portion of the incision was opened. The screw heads could be palpated, were exposed and removed again without any difficulty. The fracture appeared healed. The wound was irrigated and closed with 2-0 Vicryl and skin staples. Xeroform, 4 x 4's, Webril and Ace wrap applied.   The patient was sent to the recovery room in stable condition.   ESTIMATED BLOOD LOSS: Minimal.   COMPLICATIONS: None.   SPECIMEN: None. The hardware was discarded after taking a picture of it. There was no failure of hardware, it was just painful secondary to being subcutaneous.   TOURNIQUET TIME: Was 27 minutes at 300 mmHg.    ____________________________ Laurene Footman, MD mjm:jr D: 07/02/2014 18:19:25 ET T: 07/02/2014 18:26:08 ET JOB#: [id number]  cc: Laurene Footman, MD,  <Dictator> Laurene Footman MD ELECTRONICALLY SIGNED 07/03/2014 8:20

## 2015-04-02 ENCOUNTER — Other Ambulatory Visit: Payer: Self-pay | Admitting: Family Medicine

## 2015-04-03 ENCOUNTER — Other Ambulatory Visit (INDEPENDENT_AMBULATORY_CARE_PROVIDER_SITE_OTHER): Payer: Medicare Other

## 2015-04-03 DIAGNOSIS — Z5181 Encounter for therapeutic drug level monitoring: Secondary | ICD-10-CM | POA: Diagnosis not present

## 2015-04-03 LAB — POCT INR: INR: 2.7

## 2015-04-08 DIAGNOSIS — H2513 Age-related nuclear cataract, bilateral: Secondary | ICD-10-CM | POA: Diagnosis not present

## 2015-04-08 LAB — HM DIABETES EYE EXAM

## 2015-04-14 ENCOUNTER — Telehealth: Payer: Self-pay | Admitting: Family Medicine

## 2015-04-14 DIAGNOSIS — E119 Type 2 diabetes mellitus without complications: Secondary | ICD-10-CM

## 2015-04-14 DIAGNOSIS — E559 Vitamin D deficiency, unspecified: Secondary | ICD-10-CM

## 2015-04-14 DIAGNOSIS — D509 Iron deficiency anemia, unspecified: Secondary | ICD-10-CM

## 2015-04-14 NOTE — Telephone Encounter (Signed)
-----   Message from Ellamae Sia sent at 04/09/2015  6:27 PM EDT ----- Regarding: Lab orders for Tuesdday, May 10,16 Lab orders for a 6 month f/u

## 2015-04-15 ENCOUNTER — Other Ambulatory Visit (INDEPENDENT_AMBULATORY_CARE_PROVIDER_SITE_OTHER): Payer: Medicare Other

## 2015-04-15 ENCOUNTER — Encounter: Payer: Self-pay | Admitting: Family Medicine

## 2015-04-15 DIAGNOSIS — E559 Vitamin D deficiency, unspecified: Secondary | ICD-10-CM | POA: Diagnosis not present

## 2015-04-15 DIAGNOSIS — D509 Iron deficiency anemia, unspecified: Secondary | ICD-10-CM | POA: Diagnosis not present

## 2015-04-15 DIAGNOSIS — E119 Type 2 diabetes mellitus without complications: Secondary | ICD-10-CM

## 2015-04-15 DIAGNOSIS — E780 Pure hypercholesterolemia: Secondary | ICD-10-CM

## 2015-04-15 LAB — CBC WITH DIFFERENTIAL/PLATELET
Basophils Absolute: 0 10*3/uL (ref 0.0–0.1)
Basophils Relative: 0.4 % (ref 0.0–3.0)
EOS PCT: 1.7 % (ref 0.0–5.0)
Eosinophils Absolute: 0.1 10*3/uL (ref 0.0–0.7)
HEMATOCRIT: 41.4 % (ref 36.0–46.0)
HEMOGLOBIN: 14.4 g/dL (ref 12.0–15.0)
Lymphocytes Relative: 17.4 % (ref 12.0–46.0)
Lymphs Abs: 1.5 10*3/uL (ref 0.7–4.0)
MCHC: 34.8 g/dL (ref 30.0–36.0)
MCV: 93.3 fl (ref 78.0–100.0)
MONO ABS: 0.6 10*3/uL (ref 0.1–1.0)
MONOS PCT: 7 % (ref 3.0–12.0)
NEUTROS ABS: 6.2 10*3/uL (ref 1.4–7.7)
Neutrophils Relative %: 73.5 % (ref 43.0–77.0)
Platelets: 271 10*3/uL (ref 150.0–400.0)
RBC: 4.44 Mil/uL (ref 3.87–5.11)
RDW: 12.9 % (ref 11.5–15.5)
WBC: 8.4 10*3/uL (ref 4.0–10.5)

## 2015-04-15 LAB — COMPREHENSIVE METABOLIC PANEL
ALT: 21 U/L (ref 0–35)
AST: 23 U/L (ref 0–37)
Albumin: 3.6 g/dL (ref 3.5–5.2)
Alkaline Phosphatase: 112 U/L (ref 39–117)
BUN: 17 mg/dL (ref 6–23)
CHLORIDE: 106 meq/L (ref 96–112)
CO2: 24 mEq/L (ref 19–32)
CREATININE: 0.84 mg/dL (ref 0.40–1.20)
Calcium: 9.4 mg/dL (ref 8.4–10.5)
GFR: 71.04 mL/min (ref >60.00)
Glucose, Bld: 107 mg/dL — ABNORMAL HIGH (ref 70–99)
Potassium: 3.8 mEq/L (ref 3.5–5.1)
Sodium: 137 mEq/L (ref 135–145)
Total Bilirubin: 0.4 mg/dL (ref 0.2–1.2)
Total Protein: 7.8 g/dL (ref 6.0–8.3)

## 2015-04-15 LAB — HEMOGLOBIN A1C: HEMOGLOBIN A1C: 5.9 % (ref 4.6–6.5)

## 2015-04-15 LAB — VITAMIN D 25 HYDROXY (VIT D DEFICIENCY, FRACTURES): VITD: 22.87 ng/mL — AB (ref 30.00–100.00)

## 2015-04-15 LAB — LIPID PANEL
CHOLESTEROL: 156 mg/dL (ref 0–200)
HDL: 37.7 mg/dL — ABNORMAL LOW (ref >39.00)
NonHDL: 118.3
TRIGLYCERIDES: 253 mg/dL — AB (ref 0.0–149.0)
Total CHOL/HDL Ratio: 4
VLDL: 50.6 mg/dL — AB (ref 0.0–40.0)

## 2015-04-15 LAB — LDL CHOLESTEROL, DIRECT: LDL DIRECT: 81 mg/dL

## 2015-04-22 ENCOUNTER — Encounter: Payer: Self-pay | Admitting: Family Medicine

## 2015-04-22 ENCOUNTER — Ambulatory Visit (INDEPENDENT_AMBULATORY_CARE_PROVIDER_SITE_OTHER): Payer: Medicare Other | Admitting: Family Medicine

## 2015-04-22 VITALS — BP 114/76 | HR 69 | Temp 98.4°F | Ht 63.0 in | Wt 209.8 lb

## 2015-04-22 DIAGNOSIS — I10 Essential (primary) hypertension: Secondary | ICD-10-CM | POA: Diagnosis not present

## 2015-04-22 DIAGNOSIS — E7800 Pure hypercholesterolemia, unspecified: Secondary | ICD-10-CM

## 2015-04-22 DIAGNOSIS — M25571 Pain in right ankle and joints of right foot: Secondary | ICD-10-CM

## 2015-04-22 DIAGNOSIS — G8929 Other chronic pain: Secondary | ICD-10-CM | POA: Insufficient documentation

## 2015-04-22 LAB — HM DIABETES FOOT EXAM

## 2015-04-22 NOTE — Progress Notes (Signed)
Pre visit review using our clinic review tool, if applicable. No additional management support is needed unless otherwise documented below in the visit note. 

## 2015-04-22 NOTE — Assessment & Plan Note (Signed)
LDL almost at goal on statin, trig elevate.  Encouraged pt to increase exercise and work on low fat diet as well as weight loss.

## 2015-04-22 NOTE — Assessment & Plan Note (Signed)
Well controlled. Continue current medication.  

## 2015-04-22 NOTE — Patient Instructions (Signed)
Look into water exercise.

## 2015-04-22 NOTE — Assessment & Plan Note (Signed)
Not interested in medicaiton to treat pain or inflammation. Discussed glucosamin or topical voltaren or  Icy hot as well.

## 2015-04-22 NOTE — Progress Notes (Signed)
Diabetes: well controlled on no medicaiton  Lab Results  Component Value Date   HGBA1C 5.9 04/15/2015   Using medications without difficulties:  Hypoglycemic episodes:?  Hyperglycemic episodes:?  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  Lab Results  Component Value Date   CHOL 156 04/15/2015   HDL 37.70* 04/15/2015   LDLCALC 68 07/22/2014   LDLDIRECT 81.0 04/15/2015   TRIG 253.0* 04/15/2015   CHOLHDL 4 04/15/2015   Using medications without problems: None  Muscle aches: None  Diet compliance: Good  Exercise: Getting back to walking, but ankle still painful. Other complaints:   Hypertension: Well controlled on avapro  Using medication without problems or lightheadedness: None  Chest pain with exertion: None  Edema: stable Short of breath:Stable.Marland Kitchen No changes  Average home BPs: At goal 110/70  BP Readings from Last 3 Encounters:  04/22/15 114/76  10/22/14 120/80  07/26/14 110/80    Walking as able, diet healthy foods, water, calcium in diet.             Review of Systems  Constitutional: Negative for fever, fatigue and unexpected weight change.  HENT: Negative for congestion, sore throat, sneezing and trouble swallowing.  Eyes: Negative for pain and itching.  Respiratory: Negative for cough, chest tightness, shortness of breath and wheezing.  Cardiovascular: Negative for chest pain, palpitations and leg swelling.  Gastrointestinal: negative for constipation. Negative for nausea, abdominal pain, diarrhea and blood in stool.  Genitourinary: Negative for dysuria, hematuria, vaginal discharge, difficulty urinating and menstrual problem.  Skin: Negative for rash.  Neurological: Negative for syncope, weakness, light-headedness, numbness and headaches.   Psychiatric/Behavioral: Negative for confusion and dysphoric mood. The patient is not nervous/anxious.  Objective:   Physical Exam GEN: nad, alert and oriented HEENT: mucous membranes moist, PERRLA NECK: supple w/o LA CV: rrr. no murmur PULM: ctab, no inc wob ABD: soft, +bs EXT: no edema SKIN: no acute rash GYN: ext nml, no adnexal ttp, no uterine or ovarian enlargement, NO PAP PERFORMED Breast exam: scar on left upper breast, firm some nodules associated not mobile.. Right breast nml, neg axillary lymph nodes bilaterally.

## 2015-05-01 ENCOUNTER — Other Ambulatory Visit (INDEPENDENT_AMBULATORY_CARE_PROVIDER_SITE_OTHER): Payer: Medicare Other

## 2015-05-01 DIAGNOSIS — Z5181 Encounter for therapeutic drug level monitoring: Secondary | ICD-10-CM

## 2015-05-01 LAB — POCT INR: INR: 4.2

## 2015-05-08 ENCOUNTER — Inpatient Hospital Stay: Payer: Medicare Other | Attending: Internal Medicine

## 2015-05-08 DIAGNOSIS — Z452 Encounter for adjustment and management of vascular access device: Secondary | ICD-10-CM | POA: Diagnosis not present

## 2015-05-08 DIAGNOSIS — C801 Malignant (primary) neoplasm, unspecified: Secondary | ICD-10-CM

## 2015-05-08 DIAGNOSIS — C189 Malignant neoplasm of colon, unspecified: Secondary | ICD-10-CM | POA: Insufficient documentation

## 2015-05-08 MED ORDER — HEPARIN SOD (PORK) LOCK FLUSH 100 UNIT/ML IV SOLN
500.0000 [IU] | Freq: Once | INTRAVENOUS | Status: AC
  Administered 2015-05-08: 500 [IU] via INTRAVENOUS
  Filled 2015-05-08: qty 5

## 2015-05-08 MED ORDER — SODIUM CHLORIDE 0.9 % IJ SOLN
10.0000 mL | Freq: Once | INTRAMUSCULAR | Status: AC
  Administered 2015-05-08: 10 mL via INTRAVENOUS
  Filled 2015-05-08: qty 10

## 2015-05-16 ENCOUNTER — Other Ambulatory Visit: Payer: Self-pay | Admitting: Family Medicine

## 2015-05-29 ENCOUNTER — Ambulatory Visit (INDEPENDENT_AMBULATORY_CARE_PROVIDER_SITE_OTHER): Payer: Medicare Other | Admitting: *Deleted

## 2015-05-29 ENCOUNTER — Other Ambulatory Visit: Payer: Medicare Other

## 2015-05-29 DIAGNOSIS — Z95828 Presence of other vascular implants and grafts: Secondary | ICD-10-CM

## 2015-05-29 DIAGNOSIS — Z9889 Other specified postprocedural states: Secondary | ICD-10-CM

## 2015-05-29 DIAGNOSIS — Z5181 Encounter for therapeutic drug level monitoring: Secondary | ICD-10-CM | POA: Diagnosis not present

## 2015-05-29 LAB — POCT INR: INR: 2.3

## 2015-05-29 NOTE — Progress Notes (Signed)
Agreed -

## 2015-05-29 NOTE — Progress Notes (Signed)
Pre visit review using our clinic review tool, if applicable. No additional management support is needed unless otherwise documented below in the visit note. 

## 2015-06-16 ENCOUNTER — Inpatient Hospital Stay: Payer: Medicare Other | Attending: Internal Medicine

## 2015-06-16 DIAGNOSIS — C189 Malignant neoplasm of colon, unspecified: Secondary | ICD-10-CM | POA: Diagnosis not present

## 2015-06-16 DIAGNOSIS — Z452 Encounter for adjustment and management of vascular access device: Secondary | ICD-10-CM | POA: Insufficient documentation

## 2015-06-16 DIAGNOSIS — C801 Malignant (primary) neoplasm, unspecified: Secondary | ICD-10-CM

## 2015-06-16 MED ORDER — HEPARIN SOD (PORK) LOCK FLUSH 100 UNIT/ML IV SOLN
500.0000 [IU] | Freq: Once | INTRAVENOUS | Status: AC
  Administered 2015-06-16: 500 [IU] via INTRAVENOUS
  Filled 2015-06-16: qty 5

## 2015-06-16 MED ORDER — SODIUM CHLORIDE 0.9 % IJ SOLN
10.0000 mL | Freq: Once | INTRAMUSCULAR | Status: AC
  Administered 2015-06-16: 10 mL via INTRAVENOUS
  Filled 2015-06-16: qty 10

## 2015-06-22 ENCOUNTER — Other Ambulatory Visit: Payer: Self-pay | Admitting: Family Medicine

## 2015-06-26 ENCOUNTER — Ambulatory Visit (INDEPENDENT_AMBULATORY_CARE_PROVIDER_SITE_OTHER): Payer: Medicare Other | Admitting: *Deleted

## 2015-06-26 DIAGNOSIS — Z5181 Encounter for therapeutic drug level monitoring: Secondary | ICD-10-CM

## 2015-06-26 DIAGNOSIS — Z9889 Other specified postprocedural states: Secondary | ICD-10-CM | POA: Diagnosis not present

## 2015-06-26 DIAGNOSIS — Z95828 Presence of other vascular implants and grafts: Secondary | ICD-10-CM

## 2015-06-26 LAB — POCT INR: INR: 2.6

## 2015-06-26 NOTE — Progress Notes (Signed)
Pre visit review using our clinic review tool, if applicable. No additional management support is needed unless otherwise documented below in the visit note. 

## 2015-07-28 ENCOUNTER — Ambulatory Visit (INDEPENDENT_AMBULATORY_CARE_PROVIDER_SITE_OTHER): Payer: Medicare Other | Admitting: *Deleted

## 2015-07-28 ENCOUNTER — Inpatient Hospital Stay: Payer: Medicare Other | Attending: Internal Medicine

## 2015-07-28 DIAGNOSIS — Z452 Encounter for adjustment and management of vascular access device: Secondary | ICD-10-CM | POA: Diagnosis not present

## 2015-07-28 DIAGNOSIS — Z9889 Other specified postprocedural states: Secondary | ICD-10-CM

## 2015-07-28 DIAGNOSIS — C189 Malignant neoplasm of colon, unspecified: Secondary | ICD-10-CM | POA: Insufficient documentation

## 2015-07-28 DIAGNOSIS — Z5181 Encounter for therapeutic drug level monitoring: Secondary | ICD-10-CM

## 2015-07-28 DIAGNOSIS — C801 Malignant (primary) neoplasm, unspecified: Secondary | ICD-10-CM

## 2015-07-28 DIAGNOSIS — Z95828 Presence of other vascular implants and grafts: Secondary | ICD-10-CM

## 2015-07-28 LAB — POCT INR: INR: 3.6

## 2015-07-28 MED ORDER — SODIUM CHLORIDE 0.9 % IJ SOLN
10.0000 mL | INTRAMUSCULAR | Status: DC | PRN
  Administered 2015-07-28: 10 mL via INTRAVENOUS
  Filled 2015-07-28: qty 10

## 2015-07-28 MED ORDER — HEPARIN SOD (PORK) LOCK FLUSH 100 UNIT/ML IV SOLN
500.0000 [IU] | Freq: Once | INTRAVENOUS | Status: AC
  Administered 2015-07-28: 500 [IU] via INTRAVENOUS

## 2015-07-28 MED ORDER — HEPARIN SOD (PORK) LOCK FLUSH 100 UNIT/ML IV SOLN
INTRAVENOUS | Status: AC
  Filled 2015-07-28: qty 5

## 2015-07-28 NOTE — Progress Notes (Signed)
Pre visit review using our clinic review tool, if applicable. No additional management support is needed unless otherwise documented below in the visit note. 

## 2015-08-14 ENCOUNTER — Ambulatory Visit (INDEPENDENT_AMBULATORY_CARE_PROVIDER_SITE_OTHER): Payer: Medicare Other | Admitting: *Deleted

## 2015-08-14 DIAGNOSIS — Z9889 Other specified postprocedural states: Secondary | ICD-10-CM | POA: Diagnosis not present

## 2015-08-14 DIAGNOSIS — Z5181 Encounter for therapeutic drug level monitoring: Secondary | ICD-10-CM

## 2015-08-14 DIAGNOSIS — Z95828 Presence of other vascular implants and grafts: Secondary | ICD-10-CM

## 2015-08-14 LAB — POCT INR: INR: 3.2

## 2015-08-14 NOTE — Progress Notes (Signed)
Pre visit review using our clinic review tool, if applicable. No additional management support is needed unless otherwise documented below in the visit note. 

## 2015-08-28 ENCOUNTER — Ambulatory Visit (INDEPENDENT_AMBULATORY_CARE_PROVIDER_SITE_OTHER): Payer: Medicare Other | Admitting: *Deleted

## 2015-08-28 DIAGNOSIS — Z5181 Encounter for therapeutic drug level monitoring: Secondary | ICD-10-CM

## 2015-08-28 DIAGNOSIS — Z9889 Other specified postprocedural states: Secondary | ICD-10-CM | POA: Diagnosis not present

## 2015-08-28 DIAGNOSIS — Z95828 Presence of other vascular implants and grafts: Secondary | ICD-10-CM

## 2015-08-28 LAB — POCT INR: INR: 2

## 2015-08-28 NOTE — Progress Notes (Signed)
Pre visit review using our clinic review tool, if applicable. No additional management support is needed unless otherwise documented below in the visit note. 

## 2015-09-08 ENCOUNTER — Other Ambulatory Visit: Payer: Self-pay | Admitting: *Deleted

## 2015-09-08 ENCOUNTER — Inpatient Hospital Stay: Payer: Medicare Other | Attending: Internal Medicine

## 2015-09-08 ENCOUNTER — Inpatient Hospital Stay: Payer: Medicare Other

## 2015-09-08 DIAGNOSIS — C801 Malignant (primary) neoplasm, unspecified: Secondary | ICD-10-CM

## 2015-09-08 DIAGNOSIS — C7800 Secondary malignant neoplasm of unspecified lung: Secondary | ICD-10-CM | POA: Diagnosis not present

## 2015-09-08 DIAGNOSIS — C189 Malignant neoplasm of colon, unspecified: Secondary | ICD-10-CM

## 2015-09-08 MED ORDER — HEPARIN SOD (PORK) LOCK FLUSH 100 UNIT/ML IV SOLN
500.0000 [IU] | Freq: Once | INTRAVENOUS | Status: AC
  Administered 2015-09-08: 500 [IU] via INTRAVENOUS

## 2015-09-08 MED ORDER — SODIUM CHLORIDE 0.9 % IJ SOLN
10.0000 mL | INTRAMUSCULAR | Status: DC | PRN
  Administered 2015-09-08: 10 mL via INTRAVENOUS
  Filled 2015-09-08: qty 10

## 2015-09-08 MED ORDER — HEPARIN SOD (PORK) LOCK FLUSH 100 UNIT/ML IV SOLN
INTRAVENOUS | Status: AC
  Filled 2015-09-08: qty 5

## 2015-09-09 LAB — CEA: CEA: 1.7 ng/mL (ref 0.0–4.7)

## 2015-09-10 DIAGNOSIS — Z23 Encounter for immunization: Secondary | ICD-10-CM | POA: Diagnosis not present

## 2015-09-24 ENCOUNTER — Other Ambulatory Visit: Payer: Self-pay | Admitting: Family Medicine

## 2015-09-25 ENCOUNTER — Ambulatory Visit (INDEPENDENT_AMBULATORY_CARE_PROVIDER_SITE_OTHER): Payer: Medicare Other | Admitting: *Deleted

## 2015-09-25 DIAGNOSIS — Z95828 Presence of other vascular implants and grafts: Secondary | ICD-10-CM | POA: Diagnosis not present

## 2015-09-25 DIAGNOSIS — Z5181 Encounter for therapeutic drug level monitoring: Secondary | ICD-10-CM

## 2015-09-25 LAB — POCT INR: INR: 1.8

## 2015-09-25 NOTE — Progress Notes (Signed)
Pre visit review using our clinic review tool, if applicable. No additional management support is needed unless otherwise documented below in the visit note. 

## 2015-10-15 ENCOUNTER — Telehealth: Payer: Self-pay | Admitting: Family Medicine

## 2015-10-15 DIAGNOSIS — D509 Iron deficiency anemia, unspecified: Secondary | ICD-10-CM

## 2015-10-15 DIAGNOSIS — Z1159 Encounter for screening for other viral diseases: Secondary | ICD-10-CM

## 2015-10-15 DIAGNOSIS — E7800 Pure hypercholesterolemia, unspecified: Secondary | ICD-10-CM

## 2015-10-15 DIAGNOSIS — E119 Type 2 diabetes mellitus without complications: Secondary | ICD-10-CM

## 2015-10-15 DIAGNOSIS — M8580 Other specified disorders of bone density and structure, unspecified site: Secondary | ICD-10-CM

## 2015-10-15 NOTE — Telephone Encounter (Signed)
At time of labs..Please let pt know that I have added hep C testing to their routine labs given CDC recommends screening anyone born between [phone] (higher risk population for various reasons) given it is a dormant virus (for 20-30 years) that later can cause liver cancer and liver cirrhosis. Covered by insurance.  If pt refuses, or has had in past or would to discuss further...please cancel and notify me.

## 2015-10-15 NOTE — Telephone Encounter (Signed)
-----   Message from Marchia Bond sent at 10/09/2015 12:55 PM EDT ----- Regarding: Cpx labs Thurs 11/10 need orders, thanks, :-) Please order  future cpx labs for pt's upcoming lab appt. Thanks Aniceto Boss

## 2015-10-16 ENCOUNTER — Other Ambulatory Visit (INDEPENDENT_AMBULATORY_CARE_PROVIDER_SITE_OTHER): Payer: Medicare Other

## 2015-10-16 ENCOUNTER — Other Ambulatory Visit: Payer: Self-pay | Admitting: Family Medicine

## 2015-10-16 ENCOUNTER — Ambulatory Visit (INDEPENDENT_AMBULATORY_CARE_PROVIDER_SITE_OTHER): Payer: Medicare Other | Admitting: *Deleted

## 2015-10-16 DIAGNOSIS — E119 Type 2 diabetes mellitus without complications: Secondary | ICD-10-CM

## 2015-10-16 DIAGNOSIS — Z1159 Encounter for screening for other viral diseases: Secondary | ICD-10-CM | POA: Diagnosis not present

## 2015-10-16 DIAGNOSIS — Z5181 Encounter for therapeutic drug level monitoring: Secondary | ICD-10-CM | POA: Diagnosis not present

## 2015-10-16 DIAGNOSIS — E7800 Pure hypercholesterolemia, unspecified: Secondary | ICD-10-CM

## 2015-10-16 DIAGNOSIS — Z95828 Presence of other vascular implants and grafts: Secondary | ICD-10-CM | POA: Diagnosis not present

## 2015-10-16 LAB — COMPREHENSIVE METABOLIC PANEL
ALBUMIN: 3.9 g/dL (ref 3.5–5.2)
ALT: 27 U/L (ref 0–35)
AST: 25 U/L (ref 0–37)
Alkaline Phosphatase: 115 U/L (ref 39–117)
BUN: 14 mg/dL (ref 6–23)
CHLORIDE: 104 meq/L (ref 96–112)
CO2: 27 meq/L (ref 19–32)
CREATININE: 0.88 mg/dL (ref 0.40–1.20)
Calcium: 9.8 mg/dL (ref 8.4–10.5)
GFR: 67.23 mL/min (ref >60.00)
GLUCOSE: 116 mg/dL — AB (ref 70–99)
Potassium: 3.7 mEq/L (ref 3.5–5.1)
SODIUM: 138 meq/L (ref 135–145)
Total Bilirubin: 0.5 mg/dL (ref 0.2–1.2)
Total Protein: 8 g/dL (ref 6.0–8.3)

## 2015-10-16 LAB — HEMOGLOBIN A1C: HEMOGLOBIN A1C: 5.9 % (ref 4.6–6.5)

## 2015-10-16 LAB — POCT INR: INR: 2.6

## 2015-10-16 LAB — LIPID PANEL
CHOL/HDL RATIO: 4
CHOLESTEROL: 155 mg/dL (ref 0–200)
HDL: 35.1 mg/dL — ABNORMAL LOW (ref >39.00)
NONHDL: 120.09
Triglycerides: 220 mg/dL — ABNORMAL HIGH (ref 0.0–149.0)
VLDL: 44 mg/dL — ABNORMAL HIGH (ref 0.0–40.0)

## 2015-10-16 LAB — LDL CHOLESTEROL, DIRECT: LDL DIRECT: 83 mg/dL

## 2015-10-16 NOTE — Progress Notes (Signed)
Pre visit review using our clinic review tool, if applicable. No additional management support is needed unless otherwise documented below in the visit note. 

## 2015-10-17 LAB — HEPATITIS C ANTIBODY: HCV Ab: NEGATIVE

## 2015-10-20 ENCOUNTER — Inpatient Hospital Stay: Payer: Medicare Other | Attending: Internal Medicine

## 2015-10-20 DIAGNOSIS — C7800 Secondary malignant neoplasm of unspecified lung: Secondary | ICD-10-CM | POA: Diagnosis not present

## 2015-10-20 DIAGNOSIS — Z9221 Personal history of antineoplastic chemotherapy: Secondary | ICD-10-CM | POA: Diagnosis not present

## 2015-10-20 DIAGNOSIS — C801 Malignant (primary) neoplasm, unspecified: Secondary | ICD-10-CM

## 2015-10-20 DIAGNOSIS — Z95828 Presence of other vascular implants and grafts: Secondary | ICD-10-CM

## 2015-10-20 DIAGNOSIS — Z452 Encounter for adjustment and management of vascular access device: Secondary | ICD-10-CM | POA: Diagnosis not present

## 2015-10-20 DIAGNOSIS — C189 Malignant neoplasm of colon, unspecified: Secondary | ICD-10-CM | POA: Diagnosis not present

## 2015-10-20 DIAGNOSIS — Z5181 Encounter for therapeutic drug level monitoring: Secondary | ICD-10-CM

## 2015-10-20 MED ORDER — SODIUM CHLORIDE 0.9 % IJ SOLN
10.0000 mL | INTRAMUSCULAR | Status: DC | PRN
  Administered 2015-10-20: 10 mL via INTRAVENOUS
  Filled 2015-10-20: qty 10

## 2015-10-20 MED ORDER — HEPARIN SOD (PORK) LOCK FLUSH 100 UNIT/ML IV SOLN
500.0000 [IU] | Freq: Once | INTRAVENOUS | Status: AC
  Administered 2015-10-20: 500 [IU] via INTRAVENOUS
  Filled 2015-10-20: qty 5

## 2015-10-24 ENCOUNTER — Ambulatory Visit (INDEPENDENT_AMBULATORY_CARE_PROVIDER_SITE_OTHER): Payer: Medicare Other | Admitting: Family Medicine

## 2015-10-24 ENCOUNTER — Encounter: Payer: Self-pay | Admitting: Family Medicine

## 2015-10-24 VITALS — BP 128/80 | HR 76 | Temp 98.4°F | Ht 63.0 in | Wt 215.5 lb

## 2015-10-24 DIAGNOSIS — E7800 Pure hypercholesterolemia, unspecified: Secondary | ICD-10-CM

## 2015-10-24 DIAGNOSIS — E119 Type 2 diabetes mellitus without complications: Secondary | ICD-10-CM

## 2015-10-24 DIAGNOSIS — Z0000 Encounter for general adult medical examination without abnormal findings: Secondary | ICD-10-CM

## 2015-10-24 DIAGNOSIS — Z7189 Other specified counseling: Secondary | ICD-10-CM

## 2015-10-24 LAB — HM DIABETES FOOT EXAM

## 2015-10-24 NOTE — Progress Notes (Signed)
Pre visit review using our clinic review tool, if applicable. No additional management support is needed unless otherwise documented below in the visit note. 

## 2015-10-24 NOTE — Assessment & Plan Note (Signed)
Will work on low chol diet. If not at goal next OV < 70 will change to 40 lipitor.

## 2015-10-24 NOTE — Assessment & Plan Note (Signed)
Living Will, Legrand Como son is primary HCPOA Full code (reviewed 2016) She will bring in copy for our records.

## 2015-10-24 NOTE — Assessment & Plan Note (Signed)
Counseled on exercise,  as tolerated , healthy diet and weight loss.

## 2015-10-24 NOTE — Progress Notes (Signed)
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  I have personally reviewed the Medicare Annual Wellness questionnaire and have noted 1. The patient's medical and social history 2. Their use of alcohol, tobacco or illicit drugs 3. Their current medications and supplements 4. The patient's functional ability including ADL's, fall risks, home safety risks and hearing or visual             impairment. 5. Diet and physical activities 6. Evidence for depression or mood disorders 7.         Updated provider list Cognitive evaluation was performed and recorded on pt medicare questionnaire form. The patients weight, height, BMI and visual acuity have been recorded in the chart  I have made referrals, counseling and provided education to the patient based review of the above and I have provided the pt with a written personalized care plan for preventive services.     Breast and colon cancer followed closely by Dr. Ma Hillock,  No longer seeing Dr. Marlou Starks surgeon  Had chest, abd, pelvis stable except small nodule abdominal soft tissue..12/2014. Seeing every 6 month.  Diabetes: well controlled on no medicaiton  Lab Results  Component Value Date   HGBA1C 5.9 10/16/2015  Hypoglycemic episodes:?  Hyperglycemic episodes:?  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 Other complaints:    Hypertension: Well controlled on avapro  Using medication without problems or lightheadedness: None  Chest pain with exertion: None  Edema: stable Short of breath:Stable.Marland Kitchen No changes  Average home BPs: At goal 110/70  Other issues:  BP Readings from Last 3 Encounters:  04/22/15 114/76  10/22/14 120/80  07/26/14 110/80   Walking as able, diet healthy foods, water, calcium in diet.  Review of Systems  Constitutional: Negative for fever, fatigue and unexpected weight change.  HENT: Negative for congestion, sore throat, sneezing and trouble swallowing.  Eyes: Negative for pain and itching.  Respiratory: Negative for cough, chest tightness, shortness of breath and wheezing.  Cardiovascular: Negative for chest pain, palpitations and leg swelling.  Gastrointestinal: negative for constipation. Negative for nausea, abdominal pain, diarrhea and blood in stool.  Genitourinary: Negative for dysuria, hematuria, vaginal discharge, difficulty urinating and menstrual problem.  Skin: Negative for rash.  Neurological: Negative for syncope, weakness, light-headedness, numbness and headaches.  Psychiatric/Behavioral: Negative for confusion and dysphoric mood. The patient is not nervous/anxious.  Objective:   Physical Exam GEN: nad, alert and oriented HEENT: mucous membranes moist, PERRLA NECK: supple w/o LA CV: rrr. no murmur PULM: ctab, no inc wob ABD: soft, +bs EXT: no edema SKIN: no acute rash GYN: ext nml, no adnexal ttp, no uterine or ovarian enlargement, NO PAP PERFORMED Breast exam: scar on left upper breast, firm some nodules associated not mobile.. Right breast nml, neg axillary lymph nodes bilaterally.  Diabetic foot exam: Normal inspection No skin breakdown No calluses  Normal DP  pulses Normal sensation to light touch and monofilament Nails normal  Assessment & Plan:   The patient's preventative maintenance and recommended screening tests for an  annual wellness exam were reviewed in full today.  Brought up to date unless services declined.  Counselled on the importance of diet, exercise, and its role in overall health and mortality.  The patient's FH and SH was reviewed, including their home life, tobacco status, and drug and alcohol status.   Vaccines:  Uptodate except, considering shingles  Mammo: Hx of breast cancer.. Per onc, 02/2014 stable, breast exam at onc as well. Bone density:02/2013 osteopenia, repeat in 5 years Colon: history of stage 4 colon cancer... Per GI Dr. Collene Mares, colonoscopy 02/2013 Negative.. Repeat in 3-5 years.Marland Kitchen PAP/DVE: DVE, pap not indicated, no symptoms.  Hep C completed  Nonsmoker.

## 2015-10-24 NOTE — Patient Instructions (Signed)
Work on [employer]... decrease cheese.

## 2015-10-24 NOTE — Assessment & Plan Note (Signed)
Well controlled on no med. Encouraged exercise, weight loss, healthy eating habits.   

## 2015-11-13 ENCOUNTER — Ambulatory Visit (INDEPENDENT_AMBULATORY_CARE_PROVIDER_SITE_OTHER): Payer: Medicare Other | Admitting: *Deleted

## 2015-11-13 DIAGNOSIS — Z5181 Encounter for therapeutic drug level monitoring: Secondary | ICD-10-CM | POA: Diagnosis not present

## 2015-11-13 LAB — POCT INR: INR: 2.3

## 2015-11-13 NOTE — Progress Notes (Signed)
Pre visit review using our clinic review tool, if applicable. No additional management support is needed unless otherwise documented below in the visit note. 

## 2015-12-01 ENCOUNTER — Inpatient Hospital Stay: Payer: Medicare Other

## 2015-12-03 ENCOUNTER — Inpatient Hospital Stay: Payer: Medicare Other | Attending: Internal Medicine

## 2015-12-03 DIAGNOSIS — Z452 Encounter for adjustment and management of vascular access device: Secondary | ICD-10-CM | POA: Insufficient documentation

## 2015-12-03 DIAGNOSIS — C7800 Secondary malignant neoplasm of unspecified lung: Secondary | ICD-10-CM | POA: Diagnosis not present

## 2015-12-03 DIAGNOSIS — Z9221 Personal history of antineoplastic chemotherapy: Secondary | ICD-10-CM | POA: Insufficient documentation

## 2015-12-03 DIAGNOSIS — C189 Malignant neoplasm of colon, unspecified: Secondary | ICD-10-CM | POA: Diagnosis not present

## 2015-12-03 DIAGNOSIS — Z853 Personal history of malignant neoplasm of breast: Secondary | ICD-10-CM | POA: Diagnosis not present

## 2015-12-03 MED ORDER — HEPARIN SOD (PORK) LOCK FLUSH 100 UNIT/ML IV SOLN
INTRAVENOUS | Status: AC
  Filled 2015-12-03: qty 5

## 2015-12-03 MED ORDER — SODIUM CHLORIDE 0.9 % IJ SOLN
10.0000 mL | Freq: Once | INTRAMUSCULAR | Status: AC
  Administered 2015-12-03: 10 mL via INTRAVENOUS
  Filled 2015-12-03: qty 10

## 2015-12-03 MED ORDER — HEPARIN SOD (PORK) LOCK FLUSH 100 UNIT/ML IV SOLN
500.0000 [IU] | Freq: Once | INTRAVENOUS | Status: AC
  Administered 2015-12-03: 500 [IU] via INTRAVENOUS

## 2015-12-16 ENCOUNTER — Ambulatory Visit (INDEPENDENT_AMBULATORY_CARE_PROVIDER_SITE_OTHER): Payer: Medicare Other | Admitting: *Deleted

## 2015-12-16 DIAGNOSIS — Z5181 Encounter for therapeutic drug level monitoring: Secondary | ICD-10-CM

## 2015-12-16 DIAGNOSIS — Z95828 Presence of other vascular implants and grafts: Secondary | ICD-10-CM | POA: Diagnosis not present

## 2015-12-16 LAB — POCT INR: INR: 1.7

## 2015-12-16 NOTE — Progress Notes (Signed)
Pre visit review using our clinic review tool, if applicable. No additional management support is needed unless otherwise documented below in the visit note. 

## 2016-01-02 ENCOUNTER — Other Ambulatory Visit: Payer: Self-pay | Admitting: *Deleted

## 2016-01-02 ENCOUNTER — Encounter: Payer: Self-pay | Admitting: *Deleted

## 2016-01-02 DIAGNOSIS — Z85038 Personal history of other malignant neoplasm of large intestine: Secondary | ICD-10-CM

## 2016-01-05 ENCOUNTER — Inpatient Hospital Stay: Payer: Medicare Other

## 2016-01-05 ENCOUNTER — Ambulatory Visit: Payer: Medicare Other | Admitting: Internal Medicine

## 2016-01-05 ENCOUNTER — Encounter: Payer: Self-pay | Admitting: *Deleted

## 2016-01-05 ENCOUNTER — Inpatient Hospital Stay: Payer: Medicare Other | Attending: Internal Medicine | Admitting: Internal Medicine

## 2016-01-05 VITALS — BP 153/86 | HR 74 | Temp 98.5°F | Resp 18 | Ht 63.0 in | Wt 214.5 lb

## 2016-01-05 DIAGNOSIS — Z800 Family history of malignant neoplasm of digestive organs: Secondary | ICD-10-CM | POA: Diagnosis not present

## 2016-01-05 DIAGNOSIS — Z86000 Personal history of in-situ neoplasm of breast: Secondary | ICD-10-CM | POA: Diagnosis not present

## 2016-01-05 DIAGNOSIS — Z9221 Personal history of antineoplastic chemotherapy: Secondary | ICD-10-CM | POA: Diagnosis not present

## 2016-01-05 DIAGNOSIS — Z86718 Personal history of other venous thrombosis and embolism: Secondary | ICD-10-CM | POA: Diagnosis not present

## 2016-01-05 DIAGNOSIS — M1990 Unspecified osteoarthritis, unspecified site: Secondary | ICD-10-CM

## 2016-01-05 DIAGNOSIS — E785 Hyperlipidemia, unspecified: Secondary | ICD-10-CM | POA: Diagnosis not present

## 2016-01-05 DIAGNOSIS — Z85038 Personal history of other malignant neoplasm of large intestine: Secondary | ICD-10-CM

## 2016-01-05 DIAGNOSIS — Z7982 Long term (current) use of aspirin: Secondary | ICD-10-CM | POA: Diagnosis not present

## 2016-01-05 DIAGNOSIS — Z85118 Personal history of other malignant neoplasm of bronchus and lung: Secondary | ICD-10-CM | POA: Diagnosis not present

## 2016-01-05 DIAGNOSIS — Z7901 Long term (current) use of anticoagulants: Secondary | ICD-10-CM | POA: Insufficient documentation

## 2016-01-05 DIAGNOSIS — Z923 Personal history of irradiation: Secondary | ICD-10-CM

## 2016-01-05 DIAGNOSIS — C187 Malignant neoplasm of sigmoid colon: Secondary | ICD-10-CM

## 2016-01-05 DIAGNOSIS — Z9223 Personal history of estrogen therapy: Secondary | ICD-10-CM | POA: Diagnosis not present

## 2016-01-05 DIAGNOSIS — D509 Iron deficiency anemia, unspecified: Secondary | ICD-10-CM

## 2016-01-05 DIAGNOSIS — Z79899 Other long term (current) drug therapy: Secondary | ICD-10-CM

## 2016-01-05 LAB — CBC WITH DIFFERENTIAL/PLATELET
Basophils Absolute: 0.1 10*3/uL (ref 0–0.1)
Basophils Relative: 1 %
Eosinophils Absolute: 0.2 10*3/uL (ref 0–0.7)
Eosinophils Relative: 2 %
HEMATOCRIT: 42.4 % (ref 35.0–47.0)
HEMOGLOBIN: 14.8 g/dL (ref 12.0–16.0)
LYMPHS ABS: 1.2 10*3/uL (ref 1.0–3.6)
LYMPHS PCT: 14 %
MCH: 33.1 pg (ref 26.0–34.0)
MCHC: 34.9 g/dL (ref 32.0–36.0)
MCV: 94.8 fL (ref 80.0–100.0)
Monocytes Absolute: 0.8 10*3/uL (ref 0.2–0.9)
Monocytes Relative: 10 %
NEUTROS PCT: 73 %
Neutro Abs: 6.2 10*3/uL (ref 1.4–6.5)
Platelets: 246 10*3/uL (ref 150–440)
RBC: 4.47 MIL/uL (ref 3.80–5.20)
RDW: 12.8 % (ref 11.5–14.5)
WBC: 8.4 10*3/uL (ref 3.6–11.0)

## 2016-01-05 LAB — HEPATIC FUNCTION PANEL
ALBUMIN: 3.9 g/dL (ref 3.5–5.0)
ALT: 25 U/L (ref 14–54)
AST: 26 U/L (ref 15–41)
Alkaline Phosphatase: 108 U/L (ref 38–126)
Bilirubin, Direct: <0.1 mg/dL — ABNORMAL LOW (ref 0.1–0.5)
TOTAL PROTEIN: 8.2 g/dL — AB (ref 6.5–8.1)
Total Bilirubin: 0.4 mg/dL (ref 0.3–1.2)

## 2016-01-05 LAB — CREATININE, SERUM
Creatinine, Ser: 0.8 mg/dL (ref 0.44–1.00)
GFR calc non Af Amer: >60 mL/min (ref >60)

## 2016-01-05 MED ORDER — HEPARIN SOD (PORK) LOCK FLUSH 100 UNIT/ML IV SOLN
INTRAVENOUS | Status: AC
  Filled 2016-01-05: qty 5

## 2016-01-05 MED ORDER — HEPARIN SOD (PORK) LOCK FLUSH 100 UNIT/ML IV SOLN
500.0000 [IU] | Freq: Once | INTRAVENOUS | Status: AC
  Administered 2016-01-05: 500 [IU] via INTRAVENOUS

## 2016-01-05 MED ORDER — SODIUM CHLORIDE 0.9% FLUSH
10.0000 mL | INTRAVENOUS | Status: AC | PRN
  Administered 2016-01-05: 10 mL via INTRAVENOUS
  Filled 2016-01-05: qty 10

## 2016-01-05 NOTE — Progress Notes (Signed)
Pt will be evaluated by MD for any signs and sx when he goes in room.

## 2016-01-05 NOTE — Progress Notes (Signed)
[facility] OFFICE PROGRESS NOTE  Patient Care Team: Jinny Sanders, MD as PCP - General   SUMMARY OF ONCOLOGIC HISTORY:  # 2005- COLON CA [Stage II; s/p Surgery in [place] [facility] [place]]; no Adj chemo.   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   # Nov 2010-LLE DVT [on Tam] on coumadin; PN-G-1; port flush q 6 w  INTERVAL HISTORY:  This is my first interaction with the patient since I joined the practice September 2016. I reviewed the patient's prior charts/pertinent labs/imaging in detail; findings are summarized above.   A very pleasant 70 year old female patient with above history of metastatic colon cancer currently NED and also history of DCIS; history of DVT on Coumadin is here for follow-up.  Patient denies any blood in stools black colored stools. Denies any unusual chest pain or shortness of breath or cough. Patient denies any bleeding.  Her appetite is good. No weight loss. She has mild chronic tingling and numbness of her extremities-not any worse.   REVIEW OF SYSTEMS:  A complete 10 point review of system is done which is negative except mentioned above/history of present illness.   PAST MEDICAL HISTORY :  Past Medical History  Diagnosis Date  . IDA (iron deficiency anemia)   . Osteoarthritis   . Allergy   . TIA (transient ischemic attack) 03/08/2011  . Hypertension   . Colon cancer ([place])     stage 4 colon cancer  . Breast CA ([place])   . History of DVT (deep vein thrombosis) 2010    while taking tamoxifen-left lower extremity  . History of chemotherapy 02/2011-08/2011    FOLFOX/AVASTIN- 8 CYCLES  . History of peripheral neuropathy   . Vitamin D deficiency   . Hyperlipidemia   . Breast cancer ([place])     in situ and took radiation, no chemo  needed    PAST SURGICAL HISTORY :   Past Surgical History  Procedure Laterality Date  . Colon surgery      colectomy, partial  . Breast biopsy  1-10    fibroma removal, s/p lumpectomy and radiation   . Lobectomy  06-2009    metastatic colon cancer  . Breast lumpectomy  2010    left breast  . Broken ankle  Left 08/2012    FAMILY HISTORY :   Family History  Problem Relation Age of Onset  . Hypertension Mother   . Stroke Mother   . Coronary artery disease Mother   . Thyroid disease Mother   . Diabetes Father   . Hypertension Father   . Lung cancer Father   . Thyroid disease Brother   . Heart attack Maternal Grandfather   . Diabetes Paternal Grandmother   . Colon cancer Paternal Grandfather   . Breast cancer  40  . Deep vein thrombosis      SOCIAL HISTORY:   Social History  Substance Use Topics  . Smoking status: Never Smoker   . Smokeless tobacco: Never Used  . Alcohol Use: No    ALLERGIES:  is allergic to amoxicillin and erythromycin.  MEDICATIONS:  Current Outpatient Prescriptions  Medication Sig Dispense Refill  . acetaminophen (TYLENOL) 500 MG tablet as needed. Take 1-2 by mouth every 6 hours as needed     . aspirin 81 MG  chewable tablet Chew 81 mg by mouth daily.    . cholecalciferol (VITAMIN D) 400 UNITS TABS tablet Take 400 Units by mouth 2 (two) times daily.    . Cyanocobalamin (VITAMIN B-12) 2000 MCG TBCR Take 1 tablet by mouth daily.      . irbesartan (AVAPRO) 150 MG tablet TAKE 1 TABLET BY MOUTH EVERY DAY 90 tablet 1  . warfarin (COUMADIN) 5 MG tablet USE AS DIRECTED BY COUMADIN CLINIC 90 tablet 1  . simvastatin (ZOCOR) 40 MG tablet TAKE 1 TABLET BY MOUTH EVERY DAY AT BEDTIME 90 tablet 3   No current facility-administered medications for this visit.   Facility-Administered Medications Ordered in Other Visits  Medication Dose Route Frequency Provider Last Rate Last Dose  . sodium chloride flush (NS) 0.9 % injection 10 mL  10 mL Intravenous PRN Cammie Sickle, MD   10 mL at 01/05/16 1135    PHYSICAL EXAMINATION: ECOG PERFORMANCE STATUS:0  BP 153/86 mmHg  Pulse 74  Temp(Src) 98.5 F (36.9 C) (Tympanic)  Resp 18  Ht '5\' 3"'  (1.6 m)  Wt 214 lb 8.1 oz (97.3 kg)  BMI 38.01 kg/m2  Filed Weights   01/05/16 1136  Weight: 214 lb 8.1 oz (97.3 kg)    GENERAL: Well-nourished well-developed; Alert, no distress and comfortable.  Obese; walks with a cane. Able to sit on the exam table by herself. EYES: no pallor or icterus OROPHARYNX: no thrush or ulceration; good dentition  NECK: supple, no masses felt LYMPH:  no palpable lymphadenopathy in the cervical, axillary or inguinal regions LUNGS: clear to auscultation and  No wheeze or crackles HEART/CVS: regular rate & rhythm and no murmurs; No lower extremity edema ABDOMEN:abdomen soft, non-tender and normal bowel sounds Musculoskeletal:no cyanosis of digits and no clubbing  PSYCH: alert & oriented x 3 with fluent speech NEURO: no focal motor/sensory deficits SKIN:  no rashes or significant lesions  LABORATORY DATA:  I have reviewed the data as listed    Component Value Date/Time   NA 138 10/16/2015 0822   NA 141 12/20/2013 0901   NA 139 08/25/2012 1324   NA 142 07/03/2012 1108   K 3.7 10/16/2015 0822   K 4.1 12/20/2013 0901   K 3.7 08/25/2012 1324   K 4.0 07/03/2012 1108   CL 104 10/16/2015 0822   CL 106 12/18/2012 0842   CL 102 08/25/2012 1324   CL 98 07/03/2012 1108   CO2 27 10/16/2015 0822   CO2 27 12/20/2013 0901   CO2 28 08/25/2012 1324   CO2 27 07/03/2012 1108   GLUCOSE 116* 10/16/2015 0822   GLUCOSE 103 12/20/2013 0901   GLUCOSE 109* 12/18/2012 0842   GLUCOSE 121* 08/25/2012 1324   GLUCOSE 112 07/03/2012 1108   BUN 14 10/16/2015 0822   BUN 23.2 12/20/2013 0901   BUN 22* 08/25/2012 1324   BUN 23* 07/03/2012 1108   CREATININE 0.88 10/16/2015 0822   CREATININE 1.04 12/31/2014 1023   CREATININE 1.0 12/20/2013 0901   CREATININE 1.1 07/03/2012 1108   CALCIUM 9.8  10/16/2015 0822   CALCIUM 10.1 12/20/2013 0901   CALCIUM 10.0 08/25/2012 1324   CALCIUM 9.8 07/03/2012 1108   PROT 8.0 10/16/2015 0822   PROT 7.9 12/31/2014 1023   PROT 8.5* 12/20/2013 0901   PROT 9.1* 07/03/2012 1108   ALBUMIN 3.9 10/16/2015 0822   ALBUMIN 3.3* 12/31/2014 1023   ALBUMIN 3.7 12/20/2013 0901   ALBUMIN 3.8 07/03/2012 1108   AST 25 10/16/2015 0822   AST  24 12/31/2014 1023   AST 21 12/20/2013 0901   AST 29 07/03/2012 1108   ALT 27 10/16/2015 0822   ALT 28 12/31/2014 1023   ALT 18 12/20/2013 0901   ALT 26 07/03/2012 1108   ALKPHOS 115 10/16/2015 0822   ALKPHOS 135* 12/31/2014 1023   ALKPHOS 144 12/20/2013 0901   ALKPHOS 117* 07/03/2012 1108   BILITOT 0.5 10/16/2015 0822   BILITOT 0.4 12/31/2014 1023   BILITOT 0.60 12/20/2013 0901   BILITOT 0.80 07/03/2012 1108   GFRNONAA 56* 12/31/2014 1023   GFRNONAA 57* 06/24/2014 1300   GFRNONAA 56* 03/04/2011 0440   GFRAA >60 12/31/2014 1023   GFRAA >60 06/24/2014 1300   GFRAA  03/04/2011 0440    >60        The eGFR has been calculated using the MDRD equation. This calculation has not been validated in all clinical situations. eGFR's persistently <60 mL/min signify possible Chronic Kidney Disease.    No results found for: SPEP, UPEP  Lab Results  Component Value Date   WBC 8.4 01/05/2016   NEUTROABS 6.2 01/05/2016   HGB 14.8 01/05/2016   HCT 42.4 01/05/2016   MCV 94.8 01/05/2016   PLT 246 01/05/2016      Chemistry      Component Value Date/Time   NA 138 10/16/2015 0822   NA 141 12/20/2013 0901   NA 139 08/25/2012 1324   NA 142 07/03/2012 1108   K 3.7 10/16/2015 0822   K 4.1 12/20/2013 0901   K 3.7 08/25/2012 1324   K 4.0 07/03/2012 1108   CL 104 10/16/2015 0822   CL 106 12/18/2012 0842   CL 102 08/25/2012 1324   CL 98 07/03/2012 1108   CO2 27 10/16/2015 0822   CO2 27 12/20/2013 0901   CO2 28 08/25/2012 1324   CO2 27 07/03/2012 1108   BUN 14 10/16/2015 0822   BUN 23.2 12/20/2013 0901   BUN  22* 08/25/2012 1324   BUN 23* 07/03/2012 1108   CREATININE 0.88 10/16/2015 0822   CREATININE 1.04 12/31/2014 1023   CREATININE 1.0 12/20/2013 0901   CREATININE 1.1 07/03/2012 1108      Component Value Date/Time   CALCIUM 9.8 10/16/2015 0822   CALCIUM 10.1 12/20/2013 0901   CALCIUM 10.0 08/25/2012 1324   CALCIUM 9.8 07/03/2012 1108   ALKPHOS 115 10/16/2015 0822   ALKPHOS 135* 12/31/2014 1023   ALKPHOS 144 12/20/2013 0901   ALKPHOS 117* 07/03/2012 1108   AST 25 10/16/2015 0822   AST 24 12/31/2014 1023   AST 21 12/20/2013 0901   AST 29 07/03/2012 1108   ALT 27 10/16/2015 0822   ALT 28 12/31/2014 1023   ALT 18 12/20/2013 0901   ALT 26 07/03/2012 1108   BILITOT 0.5 10/16/2015 0822   BILITOT 0.4 12/31/2014 1023   BILITOT 0.60 12/20/2013 0901   BILITOT 0.80 07/03/2012 1108       RADIOGRAPHIC STUDIES: I have personally reviewed the radiological images as listed and agreed with the findings in the report. No results found.   ASSESSMENT & PLAN:   # Metastatic colon cancer chest wall lung recurrence/ status post lobectomy- 2010. Currently NED- as per the CAT scan in January 2015. Clinically no evidence of recurrence. However given the history of metastatic colon cancer to the lung- I would recommend- I repeat evaluation with a CAT scan in the next few weeks. She is agreeable.  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 Recent mammogram negative.  #  History of DVT left lower extremity- while on chemotherapy/ tamoxifen- in 2010. The patient has been on Coumadin for close to 6 years now. I again discussed with the patient the pros and cons of continued Coumadin. Patient is a little reluctant to come off Coumadin given the concerns of repeat blood clots. However I did discuss with her my concerns of falls while on Coumadin; and the fact that she has been on Coumadin for 6 years for a lower extremity DVT; which was likely provoked. i  have recommended that she talks to her PCP  regarding this.  # She'll follow-up with [place] in approximately 4 weeks/scans prior.   # 25 minutes face-to-face with the patient discussing the above plan of care; more than 50% of time spent on prognosis/ natural history; counseling and coordination.      Cammie Sickle, MD 01/05/2016 11:50 AM

## 2016-01-06 LAB — CEA: CEA: 1.8 ng/mL (ref 0.0–4.7)

## 2016-01-12 ENCOUNTER — Ambulatory Visit (INDEPENDENT_AMBULATORY_CARE_PROVIDER_SITE_OTHER): Payer: Medicare Other | Admitting: *Deleted

## 2016-01-12 DIAGNOSIS — Z95828 Presence of other vascular implants and grafts: Secondary | ICD-10-CM

## 2016-01-12 DIAGNOSIS — Z5181 Encounter for therapeutic drug level monitoring: Secondary | ICD-10-CM | POA: Diagnosis not present

## 2016-01-12 LAB — POCT INR: INR: 1.7

## 2016-01-12 NOTE — Progress Notes (Signed)
Pre visit review using our clinic review tool, if applicable. No additional management support is needed unless otherwise documented below in the visit note. 

## 2016-01-12 NOTE — Progress Notes (Signed)
INR remains subtherapeutic.  No medication or diet changes, no missed doses.  Will boost and increase dose today and recheck in 3 weeks.  Patient to begin taking 2.5 mg daily except 5 mg on Mondays.  Advised to limit greens over the next 2-3 days, then remain consistent beyond that.

## 2016-01-30 ENCOUNTER — Ambulatory Visit
Admission: RE | Admit: 2016-01-30 | Discharge: 2016-01-30 | Disposition: A | Discharge status: Home / Self Care | Payer: Medicare Other | Source: Ambulatory Visit | Attending: Internal Medicine | Admitting: Internal Medicine

## 2016-01-30 DIAGNOSIS — Z9889 Other specified postprocedural states: Secondary | ICD-10-CM | POA: Diagnosis not present

## 2016-01-30 DIAGNOSIS — R0602 Shortness of breath: Secondary | ICD-10-CM | POA: Diagnosis not present

## 2016-01-30 DIAGNOSIS — C187 Malignant neoplasm of sigmoid colon: Secondary | ICD-10-CM | POA: Diagnosis not present

## 2016-01-30 DIAGNOSIS — Z85038 Personal history of other malignant neoplasm of large intestine: Secondary | ICD-10-CM | POA: Diagnosis not present

## 2016-01-30 MED ORDER — IOHEXOL 300 MG/ML  SOLN
100.0000 mL | Freq: Once | INTRAMUSCULAR | Status: AC | PRN
  Administered 2016-01-30: 100 mL via INTRAVENOUS

## 2016-02-02 ENCOUNTER — Inpatient Hospital Stay: Payer: Medicare Other | Attending: Internal Medicine | Admitting: Internal Medicine

## 2016-02-02 ENCOUNTER — Ambulatory Visit (INDEPENDENT_AMBULATORY_CARE_PROVIDER_SITE_OTHER): Payer: Medicare Other | Admitting: *Deleted

## 2016-02-02 ENCOUNTER — Encounter: Payer: Self-pay | Admitting: Internal Medicine

## 2016-02-02 VITALS — BP 127/84 | HR 73 | Temp 98.3°F | Resp 18 | Ht 63.0 in | Wt 212.1 lb

## 2016-02-02 DIAGNOSIS — Z7982 Long term (current) use of aspirin: Secondary | ICD-10-CM | POA: Insufficient documentation

## 2016-02-02 DIAGNOSIS — D509 Iron deficiency anemia, unspecified: Secondary | ICD-10-CM

## 2016-02-02 DIAGNOSIS — R202 Paresthesia of skin: Secondary | ICD-10-CM

## 2016-02-02 DIAGNOSIS — Z85038 Personal history of other malignant neoplasm of large intestine: Secondary | ICD-10-CM | POA: Insufficient documentation

## 2016-02-02 DIAGNOSIS — Z8673 Personal history of transient ischemic attack (TIA), and cerebral infarction without residual deficits: Secondary | ICD-10-CM | POA: Diagnosis not present

## 2016-02-02 DIAGNOSIS — Z95828 Presence of other vascular implants and grafts: Secondary | ICD-10-CM

## 2016-02-02 DIAGNOSIS — Z79899 Other long term (current) drug therapy: Secondary | ICD-10-CM | POA: Insufficient documentation

## 2016-02-02 DIAGNOSIS — R200 Anesthesia of skin: Secondary | ICD-10-CM | POA: Diagnosis not present

## 2016-02-02 DIAGNOSIS — Z86718 Personal history of other venous thrombosis and embolism: Secondary | ICD-10-CM

## 2016-02-02 DIAGNOSIS — Z5181 Encounter for therapeutic drug level monitoring: Secondary | ICD-10-CM

## 2016-02-02 DIAGNOSIS — N281 Cyst of kidney, acquired: Secondary | ICD-10-CM

## 2016-02-02 DIAGNOSIS — Z9223 Personal history of estrogen therapy: Secondary | ICD-10-CM | POA: Diagnosis not present

## 2016-02-02 DIAGNOSIS — Z801 Family history of malignant neoplasm of trachea, bronchus and lung: Secondary | ICD-10-CM | POA: Insufficient documentation

## 2016-02-02 DIAGNOSIS — Z923 Personal history of irradiation: Secondary | ICD-10-CM | POA: Diagnosis not present

## 2016-02-02 DIAGNOSIS — C187 Malignant neoplasm of sigmoid colon: Secondary | ICD-10-CM

## 2016-02-02 DIAGNOSIS — I10 Essential (primary) hypertension: Secondary | ICD-10-CM

## 2016-02-02 DIAGNOSIS — Z9221 Personal history of antineoplastic chemotherapy: Secondary | ICD-10-CM

## 2016-02-02 DIAGNOSIS — Z7901 Long term (current) use of anticoagulants: Secondary | ICD-10-CM | POA: Diagnosis not present

## 2016-02-02 DIAGNOSIS — Z86000 Personal history of in-situ neoplasm of breast: Secondary | ICD-10-CM | POA: Insufficient documentation

## 2016-02-02 DIAGNOSIS — Z880 Allergy status to penicillin: Secondary | ICD-10-CM | POA: Insufficient documentation

## 2016-02-02 DIAGNOSIS — E785 Hyperlipidemia, unspecified: Secondary | ICD-10-CM | POA: Diagnosis not present

## 2016-02-02 DIAGNOSIS — E559 Vitamin D deficiency, unspecified: Secondary | ICD-10-CM | POA: Diagnosis not present

## 2016-02-02 LAB — POCT INR: INR: 2.3

## 2016-02-02 MED ORDER — LIDOCAINE-PRILOCAINE 2.5-2.5 % EX CREA: 1.0000 "application " | TOPICAL_CREAM | CUTANEOUS | Status: DC | PRN

## 2016-02-02 NOTE — Progress Notes (Signed)
Pt here for f/u of colon cancer and breast cancer. She still has neuropathy of hands that she states is arthritis. Feet from chemo and left is worse than right due to ankle fracture she had years ago. No other concerns.

## 2016-02-02 NOTE — Progress Notes (Signed)
[facility] OFFICE PROGRESS NOTE  Patient Care Team: Jinny Sanders, MD as PCP - General   SUMMARY OF ONCOLOGIC HISTORY:  # 2005- COLON CA [Stage II; s/p Surgery in [place] [facility] [place]]; no Adj chemo.   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   # Nov 2010-LLE DVT [on Tam] on coumadin; PN-G-1; port flush q 6 w  # Left kidney cyst [Feb 2017]  INTERVAL HISTORY:  A very pleasant 70 year old female patient with above history of metastatic colon cancer currently NED and also history of DCIS; history of DVT on Coumadin is here for follow-up/to review the results of her CT imaging.  She has mild chronic tingling and numbness of her extremities-not any worse. Patient denies any blood in stools black colored stools. Denies any unusual chest pain or shortness of breath or cough. Patient denies any bleeding. Her appetite is good. No weight loss.   REVIEW OF SYSTEMS:  A complete 10 point review of system is done which is negative except mentioned above/history of present illness.   PAST MEDICAL HISTORY :  Past Medical History  Diagnosis Date  . IDA (iron deficiency anemia)   . Osteoarthritis   . Allergy   . TIA (transient ischemic attack) 03/08/2011  . Hypertension   . Colon cancer ([place])     stage 4 colon cancer  . Breast CA ([place])   . History of DVT (deep vein thrombosis) 2010    while taking tamoxifen-left lower extremity  . History of chemotherapy 02/2011-08/2011    FOLFOX/AVASTIN- 8 CYCLES  . History of peripheral neuropathy   . Vitamin D deficiency   . Hyperlipidemia   . Breast cancer ([place])     in situ and took radiation, no chemo needed    PAST SURGICAL HISTORY :   Past Surgical History  Procedure Laterality Date  . Colon  surgery      colectomy, partial  . Breast biopsy  1-10    fibroma removal, s/p lumpectomy and radiation   . Lobectomy  06-2009    metastatic colon cancer  . Breast lumpectomy  2010    left breast  . Broken ankle  Left 08/2012    FAMILY HISTORY :   Family History  Problem Relation Age of Onset  . Hypertension Mother   . Stroke Mother   . Coronary artery disease Mother   . Thyroid disease Mother   . Diabetes Father   . Hypertension Father   . Lung cancer Father   . Thyroid disease Brother   . Heart attack Maternal Grandfather   . Diabetes Paternal Grandmother   . Colon cancer Paternal Grandfather   . Breast cancer  40  . Deep vein thrombosis      SOCIAL HISTORY:   Social History  Substance Use Topics  . Smoking status: Never Smoker   . Smokeless tobacco: Never Used  . Alcohol Use: No    ALLERGIES:  is allergic to amoxicillin and erythromycin.  MEDICATIONS:  Current Outpatient Prescriptions  Medication Sig Dispense Refill  . acetaminophen (TYLENOL) 500 MG tablet as needed. Take 1-2 by mouth every 6 hours as needed     . aspirin 81 MG chewable tablet Chew 81 mg by mouth daily.    Marland Kitchen  cholecalciferol (VITAMIN D) 400 UNITS TABS tablet Take 400 Units by mouth 2 (two) times daily.    . Cyanocobalamin (VITAMIN B-12) 2000 MCG TBCR Take 1 tablet by mouth daily.      . irbesartan (AVAPRO) 150 MG tablet TAKE 1 TABLET BY MOUTH EVERY DAY 90 tablet 1  . simvastatin (ZOCOR) 40 MG tablet TAKE 1 TABLET BY MOUTH EVERY DAY AT BEDTIME 90 tablet 3  . warfarin (COUMADIN) 5 MG tablet USE AS DIRECTED BY COUMADIN CLINIC (Patient taking differently: USE AS DIRECTED BY COUMADIN CLINIC   1 day 5 mg, and all other days 2.5 mg) 90 tablet 1   No current facility-administered medications for this visit.   Facility-Administered Medications Ordered in Other Visits  Medication Dose Route Frequency Provider Last Rate Last Dose  . sodium chloride flush (NS) 0.9 % injection 10 mL  10 mL Intravenous PRN  Cammie Sickle, MD   10 mL at 01/05/16 1135    PHYSICAL EXAMINATION: ECOG PERFORMANCE STATUS:0  BP 127/84 mmHg  Pulse 73  Temp(Src) 98.3 F (36.8 C) (Tympanic)  Resp 18  Ht '5\' 3"'$  (1.6 m)  Wt 212 lb 1.3 oz (96.2 kg)  BMI 37.58 kg/m2  Filed Weights   02/02/16 1109  Weight: 212 lb 1.3 oz (96.2 kg)    GENERAL: Well-nourished well-developed; Alert, no distress and comfortable.  Obese; walks with a cane. Able to sit on the exam table by herself.   LABORATORY DATA:  I have reviewed the data as listed    Component Value Date/Time   NA 138 10/16/2015 0822   NA 141 12/20/2013 0901   NA 139 08/25/2012 1324   NA 142 07/03/2012 1108   K 3.7 10/16/2015 0822   K 4.1 12/20/2013 0901   K 3.7 08/25/2012 1324   K 4.0 07/03/2012 1108   CL 104 10/16/2015 0822   CL 106 12/18/2012 0842   CL 102 08/25/2012 1324   CL 98 07/03/2012 1108   CO2 27 10/16/2015 0822   CO2 27 12/20/2013 0901   CO2 28 08/25/2012 1324   CO2 27 07/03/2012 1108   GLUCOSE 116* 10/16/2015 0822   GLUCOSE 103 12/20/2013 0901   GLUCOSE 109* 12/18/2012 0842   GLUCOSE 121* 08/25/2012 1324   GLUCOSE 112 07/03/2012 1108   BUN 14 10/16/2015 0822   BUN 23.2 12/20/2013 0901   BUN 22* 08/25/2012 1324   BUN 23* 07/03/2012 1108   CREATININE 0.80 01/05/2016 1114   CREATININE 1.04 12/31/2014 1023   CREATININE 1.0 12/20/2013 0901   CREATININE 1.1 07/03/2012 1108   CALCIUM 9.8 10/16/2015 0822   CALCIUM 10.1 12/20/2013 0901   CALCIUM 10.0 08/25/2012 1324   CALCIUM 9.8 07/03/2012 1108   PROT 8.2* 01/05/2016 1114   PROT 7.9 12/31/2014 1023   PROT 8.5* 12/20/2013 0901   PROT 9.1* 07/03/2012 1108   ALBUMIN 3.9 01/05/2016 1114   ALBUMIN 3.3* 12/31/2014 1023   ALBUMIN 3.7 12/20/2013 0901   ALBUMIN 3.8 07/03/2012 1108   AST 26 01/05/2016 1114   AST 24 12/31/2014 1023   AST 21 12/20/2013 0901   AST 29 07/03/2012 1108   ALT 25 01/05/2016 1114   ALT 28 12/31/2014 1023   ALT 18 12/20/2013 0901   ALT 26 07/03/2012 1108    ALKPHOS 108 01/05/2016 1114   ALKPHOS 135* 12/31/2014 1023   ALKPHOS 144 12/20/2013 0901   ALKPHOS 117* 07/03/2012 1108   BILITOT 0.4 01/05/2016 1114   BILITOT 0.4 12/31/2014 1023   BILITOT  0.60 12/20/2013 0901   BILITOT 0.80 07/03/2012 1108   GFRNONAA >60 01/05/2016 1114   GFRNONAA 56* 12/31/2014 1023   GFRNONAA 57* 06/24/2014 1300   GFRAA >60 01/05/2016 1114   GFRAA >60 12/31/2014 1023   GFRAA >60 06/24/2014 1300    No results found for: SPEP, UPEP  Lab Results  Component Value Date   WBC 8.4 01/05/2016   NEUTROABS 6.2 01/05/2016   HGB 14.8 01/05/2016   HCT 42.4 01/05/2016   MCV 94.8 01/05/2016   PLT 246 01/05/2016      Chemistry      Component Value Date/Time   NA 138 10/16/2015 0822   NA 141 12/20/2013 0901   NA 139 08/25/2012 1324   NA 142 07/03/2012 1108   K 3.7 10/16/2015 0822   K 4.1 12/20/2013 0901   K 3.7 08/25/2012 1324   K 4.0 07/03/2012 1108   CL 104 10/16/2015 0822   CL 106 12/18/2012 0842   CL 102 08/25/2012 1324   CL 98 07/03/2012 1108   CO2 27 10/16/2015 0822   CO2 27 12/20/2013 0901   CO2 28 08/25/2012 1324   CO2 27 07/03/2012 1108   BUN 14 10/16/2015 0822   BUN 23.2 12/20/2013 0901   BUN 22* 08/25/2012 1324   BUN 23* 07/03/2012 1108   CREATININE 0.80 01/05/2016 1114   CREATININE 1.04 12/31/2014 1023   CREATININE 1.0 12/20/2013 0901   CREATININE 1.1 07/03/2012 1108      Component Value Date/Time   CALCIUM 9.8 10/16/2015 0822   CALCIUM 10.1 12/20/2013 0901   CALCIUM 10.0 08/25/2012 1324   CALCIUM 9.8 07/03/2012 1108   ALKPHOS 108 01/05/2016 1114   ALKPHOS 135* 12/31/2014 1023   ALKPHOS 144 12/20/2013 0901   ALKPHOS 117* 07/03/2012 1108   AST 26 01/05/2016 1114   AST 24 12/31/2014 1023   AST 21 12/20/2013 0901   AST 29 07/03/2012 1108   ALT 25 01/05/2016 1114   ALT 28 12/31/2014 1023   ALT 18 12/20/2013 0901   ALT 26 07/03/2012 1108   BILITOT 0.4 01/05/2016 1114   BILITOT 0.4 12/31/2014 1023   BILITOT 0.60 12/20/2013 0901    BILITOT 0.80 07/03/2012 1108       RADIOGRAPHIC STUDIES: I have personally reviewed the radiological images as listed and agreed with the findings in the report. No results found.   ASSESSMENT & PLAN:   # Metastatic colon cancer chest wall lung recurrence/ status post lobectomy- 2010. CT of the chest and pelvis-NED. Recommend continued surveillance.  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 Recent mammogram negative.  # History of DVT left lower extremity- while on chemotherapy/ tamoxifen- in 2010. The patient has been on Coumadin for close to 6 years now. I think it is reasonable to take her off Coumadin at this time. Patient wants to talk to her PCP before stopping the Coumadin.  # Left kidney cyst- monitor for now.  #patient follow-up with me in 6 months/CBC CMP [facility].    # 15 minutes face-to-face with the patient discussing the above plan of care; more than 50% of time spent on prognosis/ natural history; counseling and coordination.      Cammie Sickle, MD 02/02/2016 11:23 AM

## 2016-02-02 NOTE — Progress Notes (Signed)
Pre visit review using our clinic review tool, if applicable. No additional management support is needed unless otherwise documented below in the visit note. 

## 2016-02-03 ENCOUNTER — Telehealth: Payer: Self-pay | Admitting: *Deleted

## 2016-02-03 NOTE — Telephone Encounter (Signed)
[facility] received notification that EMLA cream needed prior auth. I contacted the insurance at [phone]. Information provided. I was on the phone with the insurance company for approximately 45 minutes with insurance information clerk and was subsequently transferred to the pharmacist Ronalee Belts). He was able to get the drug approved. However, the drug was approved only for November 2016- through May 03, 2016 per insurance.  I asked the pharmacist why the RX was being back dated. He states that this is requirement from the insurance. I contacted the patient and let her know that the drug was approved. I advised her that the prior Josem Kaufmann will expire on may 29'th of this year. She needs to take this into consideration when requesting refills so that efficient time can be accomplished for prior auths.

## 2016-02-16 ENCOUNTER — Inpatient Hospital Stay: Payer: Medicare Other | Attending: Internal Medicine

## 2016-02-16 DIAGNOSIS — Z923 Personal history of irradiation: Secondary | ICD-10-CM | POA: Diagnosis not present

## 2016-02-16 DIAGNOSIS — Z85038 Personal history of other malignant neoplasm of large intestine: Secondary | ICD-10-CM | POA: Insufficient documentation

## 2016-02-16 DIAGNOSIS — C801 Malignant (primary) neoplasm, unspecified: Secondary | ICD-10-CM

## 2016-02-16 DIAGNOSIS — Z9223 Personal history of estrogen therapy: Secondary | ICD-10-CM | POA: Insufficient documentation

## 2016-02-16 DIAGNOSIS — Z86000 Personal history of in-situ neoplasm of breast: Secondary | ICD-10-CM | POA: Insufficient documentation

## 2016-02-16 DIAGNOSIS — Z9221 Personal history of antineoplastic chemotherapy: Secondary | ICD-10-CM | POA: Diagnosis not present

## 2016-02-16 DIAGNOSIS — Z452 Encounter for adjustment and management of vascular access device: Secondary | ICD-10-CM | POA: Insufficient documentation

## 2016-02-16 MED ORDER — SODIUM CHLORIDE 0.9% FLUSH
10.0000 mL | INTRAVENOUS | Status: DC | PRN
Start: 2016-02-16 — End: 2016-02-16
  Administered 2016-02-16: 10 mL via INTRAVENOUS
  Filled 2016-02-16: qty 10

## 2016-02-16 MED ORDER — HEPARIN SOD (PORK) LOCK FLUSH 100 UNIT/ML IV SOLN
500.0000 [IU] | Freq: Once | INTRAVENOUS | Status: AC
  Administered 2016-02-16: 500 [IU] via INTRAVENOUS

## 2016-02-16 MED ORDER — HEPARIN SOD (PORK) LOCK FLUSH 100 UNIT/ML IV SOLN
INTRAVENOUS | Status: AC
  Filled 2016-02-16: qty 5

## 2016-03-15 ENCOUNTER — Ambulatory Visit (INDEPENDENT_AMBULATORY_CARE_PROVIDER_SITE_OTHER): Payer: Medicare Other | Admitting: *Deleted

## 2016-03-15 DIAGNOSIS — Z5181 Encounter for therapeutic drug level monitoring: Secondary | ICD-10-CM | POA: Diagnosis not present

## 2016-03-15 DIAGNOSIS — Z95828 Presence of other vascular implants and grafts: Secondary | ICD-10-CM

## 2016-03-15 LAB — POCT INR: INR: 2.3

## 2016-03-15 NOTE — Progress Notes (Signed)
Pre visit review using our clinic review tool, if applicable. No additional management support is needed unless otherwise documented below in the visit note. 

## 2016-03-16 ENCOUNTER — Other Ambulatory Visit: Payer: Self-pay | Admitting: Family Medicine

## 2016-03-29 ENCOUNTER — Inpatient Hospital Stay: Payer: Medicare Other | Attending: Internal Medicine

## 2016-03-29 DIAGNOSIS — Z9223 Personal history of estrogen therapy: Secondary | ICD-10-CM | POA: Diagnosis not present

## 2016-03-29 DIAGNOSIS — Z9221 Personal history of antineoplastic chemotherapy: Secondary | ICD-10-CM | POA: Insufficient documentation

## 2016-03-29 DIAGNOSIS — Z86000 Personal history of in-situ neoplasm of breast: Secondary | ICD-10-CM | POA: Diagnosis not present

## 2016-03-29 DIAGNOSIS — Z923 Personal history of irradiation: Secondary | ICD-10-CM | POA: Insufficient documentation

## 2016-03-29 DIAGNOSIS — Z85038 Personal history of other malignant neoplasm of large intestine: Secondary | ICD-10-CM | POA: Insufficient documentation

## 2016-03-29 DIAGNOSIS — Z452 Encounter for adjustment and management of vascular access device: Secondary | ICD-10-CM | POA: Diagnosis not present

## 2016-03-29 DIAGNOSIS — C189 Malignant neoplasm of colon, unspecified: Secondary | ICD-10-CM

## 2016-03-29 MED ORDER — HEPARIN SOD (PORK) LOCK FLUSH 100 UNIT/ML IV SOLN
INTRAVENOUS | Status: AC
  Filled 2016-03-29: qty 5

## 2016-03-29 MED ORDER — HEPARIN SOD (PORK) LOCK FLUSH 100 UNIT/ML IV SOLN
500.0000 [IU] | Freq: Once | INTRAVENOUS | Status: AC
  Administered 2016-03-29: 500 [IU] via INTRAVENOUS

## 2016-03-29 MED ORDER — SODIUM CHLORIDE 0.9% FLUSH
10.0000 mL | Freq: Once | INTRAVENOUS | Status: AC
  Administered 2016-03-29: 10 mL via INTRAVENOUS
  Filled 2016-03-29: qty 10

## 2016-04-05 LAB — HM DIABETES EYE EXAM

## 2016-04-06 DIAGNOSIS — H2513 Age-related nuclear cataract, bilateral: Secondary | ICD-10-CM | POA: Diagnosis not present

## 2016-04-06 LAB — HM DIABETES EYE EXAM

## 2016-04-10 ENCOUNTER — Other Ambulatory Visit: Payer: Self-pay | Admitting: Family Medicine

## 2016-04-16 ENCOUNTER — Other Ambulatory Visit (INDEPENDENT_AMBULATORY_CARE_PROVIDER_SITE_OTHER): Payer: Medicare Other

## 2016-04-16 ENCOUNTER — Telehealth: Payer: Self-pay | Admitting: Family Medicine

## 2016-04-16 DIAGNOSIS — M8580 Other specified disorders of bone density and structure, unspecified site: Secondary | ICD-10-CM

## 2016-04-16 DIAGNOSIS — E559 Vitamin D deficiency, unspecified: Secondary | ICD-10-CM

## 2016-04-16 DIAGNOSIS — E119 Type 2 diabetes mellitus without complications: Secondary | ICD-10-CM

## 2016-04-16 DIAGNOSIS — D509 Iron deficiency anemia, unspecified: Secondary | ICD-10-CM

## 2016-04-16 DIAGNOSIS — E7800 Pure hypercholesterolemia, unspecified: Secondary | ICD-10-CM

## 2016-04-16 LAB — COMPREHENSIVE METABOLIC PANEL
ALBUMIN: 3.6 g/dL (ref 3.5–5.2)
ALK PHOS: 90 U/L (ref 39–117)
ALT: 20 U/L (ref 0–35)
AST: 22 U/L (ref 0–37)
BILIRUBIN TOTAL: 0.3 mg/dL (ref 0.2–1.2)
BUN: 19 mg/dL (ref 6–23)
CO2: 26 mEq/L (ref 19–32)
CREATININE: 0.69 mg/dL (ref 0.40–1.20)
Calcium: 8.4 mg/dL (ref 8.4–10.5)
Chloride: 108 mEq/L (ref 96–112)
GFR: 88.89 mL/min (ref >60.00)
GLUCOSE: 105 mg/dL — AB (ref 70–99)
Potassium: 4 mEq/L (ref 3.5–5.1)
SODIUM: 141 meq/L (ref 135–145)
TOTAL PROTEIN: 7.3 g/dL (ref 6.0–8.3)

## 2016-04-16 LAB — CBC WITH DIFFERENTIAL/PLATELET
Basophils Absolute: 0 10*3/uL (ref 0.0–0.1)
Basophils Relative: 0.4 % (ref 0.0–3.0)
Eosinophils Absolute: 0.2 10*3/uL (ref 0.0–0.7)
Eosinophils Relative: 2.7 % (ref 0.0–5.0)
HCT: 40.1 % (ref 36.0–46.0)
HEMOGLOBIN: 13.6 g/dL (ref 12.0–15.0)
Lymphocytes Relative: 20.1 % (ref 12.0–46.0)
Lymphs Abs: 1.3 10*3/uL (ref 0.7–4.0)
MCHC: 33.8 g/dL (ref 30.0–36.0)
MCV: 94.1 fl (ref 78.0–100.0)
MONOS PCT: 8.1 % (ref 3.0–12.0)
Monocytes Absolute: 0.5 10*3/uL (ref 0.1–1.0)
Neutro Abs: 4.6 10*3/uL (ref 1.4–7.7)
Neutrophils Relative %: 68.7 % (ref 43.0–77.0)
Platelets: 332 10*3/uL (ref 150.0–400.0)
RBC: 4.26 Mil/uL (ref 3.87–5.11)
RDW: 12.9 % (ref 11.5–15.5)
WBC: 6.7 10*3/uL (ref 4.0–10.5)

## 2016-04-16 LAB — LIPID PANEL
Cholesterol: 122 mg/dL (ref 0–200)
HDL: 24.2 mg/dL — ABNORMAL LOW (ref >39.00)
LDL Cholesterol: 63 mg/dL (ref 0–99)
NonHDL: 97.64
TRIGLYCERIDES: 175 mg/dL — AB (ref 0.0–149.0)
Total CHOL/HDL Ratio: 5
VLDL: 35 mg/dL (ref 0.0–40.0)

## 2016-04-16 LAB — VITAMIN D 25 HYDROXY (VIT D DEFICIENCY, FRACTURES): VITD: 29.52 ng/mL — AB (ref 30.00–100.00)

## 2016-04-16 LAB — HEMOGLOBIN A1C: HEMOGLOBIN A1C: 6.3 % (ref 4.6–6.5)

## 2016-04-16 NOTE — Telephone Encounter (Signed)
-----   Message from Ellamae Sia sent at 04/12/2016  2:53 PM EDT ----- Regarding: Lab orders for Friday, 5.12.17 Lab orders for a 6 month follow up appt

## 2016-04-19 ENCOUNTER — Ambulatory Visit (INDEPENDENT_AMBULATORY_CARE_PROVIDER_SITE_OTHER): Payer: Medicare Other | Admitting: *Deleted

## 2016-04-19 DIAGNOSIS — Z5181 Encounter for therapeutic drug level monitoring: Secondary | ICD-10-CM | POA: Diagnosis not present

## 2016-04-19 DIAGNOSIS — Z95828 Presence of other vascular implants and grafts: Secondary | ICD-10-CM | POA: Diagnosis not present

## 2016-04-19 LAB — POCT INR: INR: 4.1

## 2016-04-19 NOTE — Progress Notes (Signed)
Pre visit review using our clinic review tool, if applicable. No additional management support is needed unless otherwise documented below in the visit note.  INR is supra therapeutic today.  Patient denies any extra doses or diet changes.  She has been taking otc cold and flu meds for acute illness over the past 2 weeks.  Patient instructed to skip Coumadin today, then resume as scheduled.  She was encouraged to eat a good serving of greens over the next 3-4 days.  Will recheck in 2 weeks and make additional adjustments at that time if needed.

## 2016-04-23 ENCOUNTER — Ambulatory Visit (INDEPENDENT_AMBULATORY_CARE_PROVIDER_SITE_OTHER): Payer: Medicare Other | Admitting: Family Medicine

## 2016-04-23 ENCOUNTER — Telehealth: Payer: Self-pay | Admitting: Family Medicine

## 2016-04-23 ENCOUNTER — Encounter: Payer: Self-pay | Admitting: Family Medicine

## 2016-04-23 VITALS — BP 140/90 | HR 77 | Temp 98.2°F | Ht 63.0 in | Wt 207.8 lb

## 2016-04-23 DIAGNOSIS — E7800 Pure hypercholesterolemia, unspecified: Secondary | ICD-10-CM | POA: Diagnosis not present

## 2016-04-23 DIAGNOSIS — C50912 Malignant neoplasm of unspecified site of left female breast: Secondary | ICD-10-CM

## 2016-04-23 DIAGNOSIS — E119 Type 2 diabetes mellitus without complications: Secondary | ICD-10-CM | POA: Diagnosis not present

## 2016-04-23 DIAGNOSIS — I10 Essential (primary) hypertension: Secondary | ICD-10-CM | POA: Diagnosis not present

## 2016-04-23 LAB — HM DIABETES FOOT EXAM

## 2016-04-23 NOTE — Assessment & Plan Note (Signed)
Well controlled at home, white coat HTN. Continue current medication.

## 2016-04-23 NOTE — Addendum Note (Signed)
Addended by: Carter Kitten on: 04/23/2016 11:46 AM   Modules accepted: Orders

## 2016-04-23 NOTE — Patient Instructions (Signed)
Stop at front desk to set up diagnostic mammogram.

## 2016-04-23 NOTE — Telephone Encounter (Signed)
i spoke to [place] order needs to be changed to ultlra sound IMG [id number] and IMG [id number] And mammogram img [id number]   Pt wants morning appointments After 6/1 cannot go 6/6 [place]

## 2016-04-23 NOTE — Progress Notes (Signed)
70 year old female presents for 6 month follow up.   She does still have decreased energy following severe respiratory illness several weeks ago. Mild occ cough, much better, no fever.  She is bruising given OTC med use, change in diet and hypertheraputic INR 4.1.  no other bleeding.   Diabetes: well controlled on no medicaiton  Lab Results  Component Value Date   HGBA1C 6.3 04/16/2016  Hypoglycemic episodes:?  Hyperglycemic episodes:?  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  Blood Sugars averaging:Not checking  eye exam within last year: due, scheduled One ace I   Elevated Cholesterol: At goal <70 with CVA history on simvastatin Lab Results  Component Value Date   CHOL 122 04/16/2016   HDL 24.20* 04/16/2016   LDLCALC 63 04/16/2016   LDLDIRECT 83.0 10/16/2015   TRIG 175.0* 04/16/2016   CHOLHDL 5 04/16/2016  Using medications without problems: None  Muscle aches: None  Diet compliance: Good  Exercise: She has been walking every other day Other complaints:   Hypertension: Well controlled on avapro at home, white coat HTN Using medication without problems or lightheadedness: None  Chest pain with exertion: None  Edema: stable Short of breath:Stable.Marland Kitchen No changes  Average home BPs: At goal 120/70  Other issues:  BP Readings from Last 3 Encounters:  04/23/16 140/90  02/02/16 127/84  01/05/16 153/86   Walking as able, diet healthy foods, water, calcium in diet.  Review of Systems  Constitutional: Negative for fever, fatigue and unexpected weight change.  HENT: Negative for congestion, sore throat, sneezing and trouble swallowing.  Eyes: Negative for pain and itching.  Respiratory: Negative for cough, chest tightness, shortness of breath and wheezing.  Cardiovascular: Negative for chest pain, palpitations and leg swelling.  Gastrointestinal: negative for constipation. Negative for nausea, abdominal pain, diarrhea and blood in stool.   Genitourinary: Negative for dysuria, hematuria, vaginal discharge, difficulty urinating and menstrual problem.  Skin: Negative for rash.  Neurological: Negative for syncope, weakness, light-headedness, numbness and headaches.  Psychiatric/Behavioral: Negative for confusion and dysphoric mood. The patient is not nervous/anxious.  Objective:   Physical Exam GEN: nad, alert and oriented HEENT: mucous membranes moist, PERRLA NECK: supple w/o LA CV: rrr. no murmur PULM: ctab, no inc wob ABD: soft, +bs EXT: no edema SKIN: no acute rash GYN: ext nml, no adnexal ttp, no uterine or ovarian enlargement, NO PAP PERFORMED Breast exam: scar on left upper breast, firm some nodules associated not mobile.. Right breast nml, neg axillary lymph nodes bilaterally.  Diabetic foot exam: Normal inspection No skin breakdown No calluses  Normal DP pulses Normal sensation to light touch and monofilament Nails normal

## 2016-04-23 NOTE — Assessment & Plan Note (Signed)
Well controlled on no med, with diet.

## 2016-04-23 NOTE — Progress Notes (Signed)
Pre visit review using our clinic review tool, if applicable. No additional management support is needed unless otherwise documented below in the visit note. 

## 2016-04-23 NOTE — Telephone Encounter (Signed)
Orders entered as requested.

## 2016-04-23 NOTE — Assessment & Plan Note (Signed)
Well controlled. Continue current medication. Work on increasing HDL, discussed methods.

## 2016-04-26 ENCOUNTER — Ambulatory Visit: Payer: Self-pay

## 2016-04-28 NOTE — Telephone Encounter (Signed)
Appointment 6/7 pt aware

## 2016-05-06 ENCOUNTER — Ambulatory Visit (INDEPENDENT_AMBULATORY_CARE_PROVIDER_SITE_OTHER): Payer: Medicare Other | Admitting: *Deleted

## 2016-05-06 DIAGNOSIS — Z5181 Encounter for therapeutic drug level monitoring: Secondary | ICD-10-CM

## 2016-05-06 DIAGNOSIS — Z95828 Presence of other vascular implants and grafts: Secondary | ICD-10-CM

## 2016-05-06 LAB — POCT INR: INR: 2.4

## 2016-05-06 NOTE — Progress Notes (Signed)
Pre visit review using our clinic review tool, if applicable. No additional management support is needed unless otherwise documented below in the visit note. 

## 2016-05-10 ENCOUNTER — Inpatient Hospital Stay: Payer: Medicare Other | Attending: Internal Medicine

## 2016-05-10 DIAGNOSIS — Z923 Personal history of irradiation: Secondary | ICD-10-CM | POA: Insufficient documentation

## 2016-05-10 DIAGNOSIS — Z452 Encounter for adjustment and management of vascular access device: Secondary | ICD-10-CM | POA: Insufficient documentation

## 2016-05-10 DIAGNOSIS — Z85038 Personal history of other malignant neoplasm of large intestine: Secondary | ICD-10-CM | POA: Diagnosis not present

## 2016-05-10 DIAGNOSIS — Z9221 Personal history of antineoplastic chemotherapy: Secondary | ICD-10-CM | POA: Insufficient documentation

## 2016-05-10 DIAGNOSIS — Z95828 Presence of other vascular implants and grafts: Secondary | ICD-10-CM

## 2016-05-10 DIAGNOSIS — Z9223 Personal history of estrogen therapy: Secondary | ICD-10-CM | POA: Insufficient documentation

## 2016-05-10 DIAGNOSIS — Z86000 Personal history of in-situ neoplasm of breast: Secondary | ICD-10-CM | POA: Diagnosis not present

## 2016-05-10 MED ORDER — HEPARIN SOD (PORK) LOCK FLUSH 100 UNIT/ML IV SOLN
500.0000 [IU] | Freq: Once | INTRAVENOUS | Status: AC
  Administered 2016-05-10: 500 [IU] via INTRAVENOUS

## 2016-05-10 MED ORDER — SODIUM CHLORIDE 0.9% FLUSH
10.0000 mL | INTRAVENOUS | Status: DC | PRN
  Administered 2016-05-10: 10 mL via INTRAVENOUS
  Filled 2016-05-10: qty 10

## 2016-05-12 ENCOUNTER — Ambulatory Visit
Admission: RE | Admit: 2016-05-12 | Discharge: 2016-05-12 | Disposition: A | Discharge status: Home / Self Care | Payer: Medicare Other | Source: Ambulatory Visit | Attending: Family Medicine | Admitting: Family Medicine

## 2016-05-12 ENCOUNTER — Other Ambulatory Visit: Payer: Self-pay | Admitting: Family Medicine

## 2016-05-12 DIAGNOSIS — Z853 Personal history of malignant neoplasm of breast: Secondary | ICD-10-CM | POA: Diagnosis not present

## 2016-05-12 DIAGNOSIS — R928 Other abnormal and inconclusive findings on diagnostic imaging of breast: Secondary | ICD-10-CM | POA: Diagnosis not present

## 2016-05-12 DIAGNOSIS — N63 Unspecified lump in breast: Secondary | ICD-10-CM | POA: Diagnosis not present

## 2016-05-12 DIAGNOSIS — C50912 Malignant neoplasm of unspecified site of left female breast: Secondary | ICD-10-CM

## 2016-05-12 DIAGNOSIS — Z9889 Other specified postprocedural states: Secondary | ICD-10-CM | POA: Insufficient documentation

## 2016-05-12 DIAGNOSIS — R921 Mammographic calcification found on diagnostic imaging of breast: Secondary | ICD-10-CM | POA: Diagnosis not present

## 2016-06-06 ENCOUNTER — Other Ambulatory Visit: Payer: Self-pay | Admitting: Family Medicine

## 2016-06-10 ENCOUNTER — Other Ambulatory Visit: Payer: Self-pay | Admitting: *Deleted

## 2016-06-10 MED ORDER — SIMVASTATIN 40 MG PO TABS: 40.0000 mg | ORAL_TABLET | Freq: Every day | ORAL | Status: DC

## 2016-06-17 ENCOUNTER — Other Ambulatory Visit (INDEPENDENT_AMBULATORY_CARE_PROVIDER_SITE_OTHER): Payer: Medicare Other

## 2016-06-17 DIAGNOSIS — Z95828 Presence of other vascular implants and grafts: Secondary | ICD-10-CM | POA: Diagnosis not present

## 2016-06-17 DIAGNOSIS — Z5181 Encounter for therapeutic drug level monitoring: Secondary | ICD-10-CM | POA: Diagnosis not present

## 2016-06-17 LAB — POCT INR: INR: 4.3

## 2016-06-21 ENCOUNTER — Inpatient Hospital Stay: Payer: Medicare Other | Attending: Internal Medicine

## 2016-06-21 DIAGNOSIS — C50912 Malignant neoplasm of unspecified site of left female breast: Secondary | ICD-10-CM | POA: Insufficient documentation

## 2016-06-21 DIAGNOSIS — Z452 Encounter for adjustment and management of vascular access device: Secondary | ICD-10-CM | POA: Insufficient documentation

## 2016-06-21 DIAGNOSIS — Z95828 Presence of other vascular implants and grafts: Secondary | ICD-10-CM

## 2016-06-21 MED ORDER — HEPARIN SOD (PORK) LOCK FLUSH 100 UNIT/ML IV SOLN
500.0000 [IU] | Freq: Once | INTRAVENOUS | Status: AC
  Administered 2016-06-21: 500 [IU] via INTRAVENOUS

## 2016-06-21 MED ORDER — SODIUM CHLORIDE 0.9% FLUSH
10.0000 mL | INTRAVENOUS | Status: DC | PRN
  Administered 2016-06-21: 10 mL via INTRAVENOUS
  Filled 2016-06-21: qty 10

## 2016-06-24 ENCOUNTER — Other Ambulatory Visit (INDEPENDENT_AMBULATORY_CARE_PROVIDER_SITE_OTHER): Payer: Medicare Other

## 2016-06-24 DIAGNOSIS — Z5181 Encounter for therapeutic drug level monitoring: Secondary | ICD-10-CM | POA: Diagnosis not present

## 2016-06-24 DIAGNOSIS — Z95828 Presence of other vascular implants and grafts: Secondary | ICD-10-CM | POA: Diagnosis not present

## 2016-06-24 LAB — POCT INR: INR: 2.4

## 2016-06-28 ENCOUNTER — Encounter: Payer: Self-pay | Admitting: Family Medicine

## 2016-06-29 NOTE — Telephone Encounter (Signed)
Please let pt know INR stable. Continue current dose and recheck in 4 weeks.

## 2016-07-26 ENCOUNTER — Other Ambulatory Visit (INDEPENDENT_AMBULATORY_CARE_PROVIDER_SITE_OTHER): Payer: Medicare Other

## 2016-07-26 DIAGNOSIS — I82409 Acute embolism and thrombosis of unspecified deep veins of unspecified lower extremity: Secondary | ICD-10-CM | POA: Diagnosis not present

## 2016-07-26 DIAGNOSIS — Z5181 Encounter for therapeutic drug level monitoring: Secondary | ICD-10-CM | POA: Diagnosis not present

## 2016-07-26 LAB — POCT INR: INR: 2.6

## 2016-08-02 ENCOUNTER — Inpatient Hospital Stay: Payer: Medicare Other

## 2016-08-02 ENCOUNTER — Inpatient Hospital Stay: Payer: Medicare Other | Attending: Internal Medicine | Admitting: Internal Medicine

## 2016-08-02 ENCOUNTER — Encounter: Payer: Self-pay | Admitting: Internal Medicine

## 2016-08-02 VITALS — BP 152/85 | HR 70 | Temp 97.9°F | Wt 211.3 lb

## 2016-08-02 DIAGNOSIS — C187 Malignant neoplasm of sigmoid colon: Secondary | ICD-10-CM | POA: Insufficient documentation

## 2016-08-02 DIAGNOSIS — Z7901 Long term (current) use of anticoagulants: Secondary | ICD-10-CM | POA: Diagnosis not present

## 2016-08-02 DIAGNOSIS — Z86000 Personal history of in-situ neoplasm of breast: Secondary | ICD-10-CM | POA: Diagnosis not present

## 2016-08-02 DIAGNOSIS — I10 Essential (primary) hypertension: Secondary | ICD-10-CM | POA: Diagnosis not present

## 2016-08-02 DIAGNOSIS — Z801 Family history of malignant neoplasm of trachea, bronchus and lung: Secondary | ICD-10-CM | POA: Diagnosis not present

## 2016-08-02 DIAGNOSIS — Z7982 Long term (current) use of aspirin: Secondary | ICD-10-CM | POA: Diagnosis not present

## 2016-08-02 DIAGNOSIS — Z95828 Presence of other vascular implants and grafts: Secondary | ICD-10-CM

## 2016-08-02 DIAGNOSIS — Z86718 Personal history of other venous thrombosis and embolism: Secondary | ICD-10-CM | POA: Insufficient documentation

## 2016-08-02 DIAGNOSIS — Z9181 History of falling: Secondary | ICD-10-CM | POA: Diagnosis not present

## 2016-08-02 DIAGNOSIS — E785 Hyperlipidemia, unspecified: Secondary | ICD-10-CM

## 2016-08-02 DIAGNOSIS — M1990 Unspecified osteoarthritis, unspecified site: Secondary | ICD-10-CM | POA: Insufficient documentation

## 2016-08-02 DIAGNOSIS — Z800 Family history of malignant neoplasm of digestive organs: Secondary | ICD-10-CM | POA: Insufficient documentation

## 2016-08-02 DIAGNOSIS — Z923 Personal history of irradiation: Secondary | ICD-10-CM | POA: Insufficient documentation

## 2016-08-02 DIAGNOSIS — Z9221 Personal history of antineoplastic chemotherapy: Secondary | ICD-10-CM

## 2016-08-02 DIAGNOSIS — G629 Polyneuropathy, unspecified: Secondary | ICD-10-CM | POA: Insufficient documentation

## 2016-08-02 DIAGNOSIS — Z9223 Personal history of estrogen therapy: Secondary | ICD-10-CM

## 2016-08-02 DIAGNOSIS — Z79899 Other long term (current) drug therapy: Secondary | ICD-10-CM | POA: Insufficient documentation

## 2016-08-02 DIAGNOSIS — Z8673 Personal history of transient ischemic attack (TIA), and cerebral infarction without residual deficits: Secondary | ICD-10-CM | POA: Insufficient documentation

## 2016-08-02 DIAGNOSIS — N281 Cyst of kidney, acquired: Secondary | ICD-10-CM | POA: Insufficient documentation

## 2016-08-02 DIAGNOSIS — Z85038 Personal history of other malignant neoplasm of large intestine: Secondary | ICD-10-CM | POA: Insufficient documentation

## 2016-08-02 LAB — CBC WITH DIFFERENTIAL/PLATELET
BASOS ABS: 0.1 10*3/uL (ref 0–0.1)
BASOS PCT: 1 %
EOS ABS: 0.2 10*3/uL (ref 0–0.7)
Eosinophils Relative: 2 %
HEMATOCRIT: 40.2 % (ref 35.0–47.0)
Hemoglobin: 14.1 g/dL (ref 12.0–16.0)
Lymphocytes Relative: 17 %
Lymphs Abs: 1.5 10*3/uL (ref 1.0–3.6)
MCH: 33.1 pg (ref 26.0–34.0)
MCHC: 35.1 g/dL (ref 32.0–36.0)
MCV: 94.3 fL (ref 80.0–100.0)
MONO ABS: 0.8 10*3/uL (ref 0.2–0.9)
Monocytes Relative: 9 %
NEUTROS ABS: 6.1 10*3/uL (ref 1.4–6.5)
NEUTROS PCT: 71 %
Platelets: 279 10*3/uL (ref 150–440)
RBC: 4.26 MIL/uL (ref 3.80–5.20)
RDW: 12.9 % (ref 11.5–14.5)
WBC: 8.6 10*3/uL (ref 3.6–11.0)

## 2016-08-02 LAB — COMPREHENSIVE METABOLIC PANEL
ALBUMIN: 3.9 g/dL (ref 3.5–5.0)
ALT: 18 U/L (ref 14–54)
ANION GAP: 7 (ref 5–15)
AST: 23 U/L (ref 15–41)
Alkaline Phosphatase: 108 U/L (ref 38–126)
BILIRUBIN TOTAL: 0.6 mg/dL (ref 0.3–1.2)
BUN: 17 mg/dL (ref 6–20)
CHLORIDE: 103 mmol/L (ref 101–111)
CO2: 27 mmol/L (ref 22–32)
Calcium: 9.1 mg/dL (ref 8.9–10.3)
Creatinine, Ser: 0.86 mg/dL (ref 0.44–1.00)
GFR calc Af Amer: >60 mL/min (ref >60)
GFR calc non Af Amer: >60 mL/min (ref >60)
GLUCOSE: 111 mg/dL — AB (ref 65–99)
POTASSIUM: 3.6 mmol/L (ref 3.5–5.1)
SODIUM: 137 mmol/L (ref 135–145)
Total Protein: 7.9 g/dL (ref 6.5–8.1)

## 2016-08-02 MED ORDER — SODIUM CHLORIDE 0.9% FLUSH
10.0000 mL | INTRAVENOUS | Status: DC | PRN
  Administered 2016-08-02: 10 mL via INTRAVENOUS
  Filled 2016-08-02: qty 10

## 2016-08-02 MED ORDER — HEPARIN SOD (PORK) LOCK FLUSH 100 UNIT/ML IV SOLN
500.0000 [IU] | Freq: Once | INTRAVENOUS | Status: AC
  Administered 2016-08-02: 500 [IU] via INTRAVENOUS

## 2016-08-02 NOTE — Assessment & Plan Note (Signed)
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 CT of the chest and pelvis-NED. Clinically NED.  Recommend continued surveillance.  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 June 2017-mammogram negative.  # History of DVT left lower extremity- while on chemotherapy/ tamoxifen- in 2010. Given the recent fall- recommend holding off coumadin.  # Left kidney cyst- monitor for now.  #patient follow-up with me in 6 months/CBC CMP CEA.   # 15 minutes face-to-face with the patient discussing the above plan of care; more than 50% of time spent on prognosis/ natural history; counseling and coordination.

## 2016-08-02 NOTE — Progress Notes (Signed)
[facility] OFFICE PROGRESS NOTE  Patient Care Team: Jinny Sanders, MD as PCP - General   SUMMARY OF ONCOLOGIC HISTORY:  Oncology History   # 2005- COLON CA [Stage II; s/p Surgery in [place] [facility] [place]]; no Adj chemo.   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   # Nov 2010-LLE DVT [on Tam] on coumadin; AUG 2017- STOP coumadin  # ; PN-G-1; port flush q 6 w  # Left kidney cyst [Feb 2017]     Cancer of sigmoid colon ([place])     INTERVAL HISTORY:  A very pleasant 70 year old female patient with above history of metastatic colon cancer currently NED and also history of DCIS; history of DVT on Coumadin is here for follow-up.  She had a recent fall. Had a bruise on her left thigh. Otherwise no problem with the head.  She has mild chronic tingling and numbness of her extremities-not any worse. Patient denies any blood in stools black colored stools. Denies any unusual chest pain or shortness of breath or cough. Patient denies any bleeding. Her appetite is good. No weight loss.   REVIEW OF SYSTEMS:  A complete 10 point review of system is done which is negative except mentioned above/history of present illness.   PAST MEDICAL HISTORY :  Past Medical History:  Diagnosis Date  . Allergy   . Breast CA ([place])   . Breast cancer ([place]) 2010   in situ and took radiation, no chemo needed  . Colon cancer ([place])    stage 4 colon cancer  . History of chemotherapy 02/2011-08/2011   FOLFOX/AVASTIN- 8 CYCLES  . History of DVT (deep vein thrombosis) 2010   while taking tamoxifen-left lower extremity  . History of peripheral neuropathy   . Hyperlipidemia   . Hypertension   . IDA (iron deficiency anemia)   . Osteoarthritis   . TIA  (transient ischemic attack) 03/08/2011  . Vitamin D deficiency     PAST SURGICAL HISTORY :   Past Surgical History:  Procedure Laterality Date  . BREAST BIOPSY  1-10   fibroma removal, s/p lumpectomy and radiation   . BREAST LUMPECTOMY  2010   left breast  . broken ankle  Left 08/2012  . COLON SURGERY     colectomy, partial  . LOBECTOMY  06-2009   metastatic colon cancer    FAMILY HISTORY :   Family History  Problem Relation Age of Onset  . Hypertension Mother   . Stroke Mother   . Coronary artery disease Mother   . Thyroid disease Mother   . Diabetes Father   . Hypertension Father   . Lung cancer Father   . Thyroid disease Brother   . Heart attack Maternal Grandfather   . Diabetes Paternal Grandmother   . Colon cancer Paternal Grandfather   . Deep vein thrombosis    . Breast cancer Maternal Grandmother     SOCIAL HISTORY:   Social History  Substance Use Topics  . Smoking status: Never Smoker  . Smokeless tobacco: Never Used  . Alcohol use No    ALLERGIES:  is allergic to amoxicillin and erythromycin.  MEDICATIONS:  Current Outpatient Prescriptions  Medication Sig Dispense Refill  . acetaminophen (TYLENOL) 500 MG tablet as needed. Take 1-2  by mouth every 6 hours as needed     . aspirin 81 MG chewable tablet Chew 81 mg by mouth daily.    . cholecalciferol (VITAMIN D) 400 UNITS TABS tablet Take 400 Units by mouth 2 (two) times daily.    . Cyanocobalamin (VITAMIN B-12) 2000 MCG TBCR Take 1 tablet by mouth daily.      . irbesartan (AVAPRO) 150 MG tablet TAKE 1 TABLET BY MOUTH EVERY DAY 90 tablet 1  . lidocaine-prilocaine (EMLA) cream Apply 1 application topically as needed. To port a cath site approximately 1 to 2 hours prior to port needle insertion. 30 g 12  . simvastatin (ZOCOR) 40 MG tablet Take 1 tablet (40 mg total) by mouth at bedtime. 90 tablet 3  . warfarin (COUMADIN) 5 MG tablet USE AS DIRECTED BY COUMADIN CLINIC 55 tablet 0   No current  facility-administered medications for this visit.    Facility-Administered Medications Ordered in Other Visits  Medication Dose Route Frequency Provider Last Rate Last Dose  . sodium chloride flush (NS) 0.9 % injection 10 mL  10 mL Intravenous PRN Cammie Sickle, MD   10 mL at 01/05/16 1135    PHYSICAL EXAMINATION: ECOG PERFORMANCE STATUS:0  BP (!) 152/85 (BP Location: Right Arm, Patient Position: Sitting)   Pulse 70   Temp 97.9 F (36.6 C) (Tympanic)   Wt 211 lb 5 oz (95.9 kg)   BMI 37.43 kg/m   Filed Weights   08/02/16 1113  Weight: 211 lb 5 oz (95.9 kg)    GENERAL: Well-nourished well-developed; Alert, no distress and comfortable.  Obese; walks with a cane. Able to sit on the exam table by herself.   LABORATORY DATA:  I have reviewed the data as listed    Component Value Date/Time   NA 137 08/02/2016 1013   NA 141 12/20/2013 0901   K 3.6 08/02/2016 1013   K 4.1 12/20/2013 0901   CL 103 08/02/2016 1013   CL 106 12/18/2012 0842   CO2 27 08/02/2016 1013   CO2 27 12/20/2013 0901   GLUCOSE 111 (H) 08/02/2016 1013   GLUCOSE 103 12/20/2013 0901   GLUCOSE 109 (H) 12/18/2012 0842   BUN 17 08/02/2016 1013   BUN 23.2 12/20/2013 0901   CREATININE 0.86 08/02/2016 1013   CREATININE 1.04 12/31/2014 1023   CREATININE 1.0 12/20/2013 0901   CALCIUM 9.1 08/02/2016 1013   CALCIUM 10.1 12/20/2013 0901   PROT 7.9 08/02/2016 1013   PROT 7.9 12/31/2014 1023   PROT 8.5 (H) 12/20/2013 0901   ALBUMIN 3.9 08/02/2016 1013   ALBUMIN 3.3 (L) 12/31/2014 1023   ALBUMIN 3.7 12/20/2013 0901   AST 23 08/02/2016 1013   AST 24 12/31/2014 1023   AST 21 12/20/2013 0901   ALT 18 08/02/2016 1013   ALT 28 12/31/2014 1023   ALT 18 12/20/2013 0901   ALKPHOS 108 08/02/2016 1013   ALKPHOS 135 (H) 12/31/2014 1023   ALKPHOS 144 12/20/2013 0901   BILITOT 0.6 08/02/2016 1013   BILITOT 0.4 12/31/2014 1023   BILITOT 0.60 12/20/2013 0901   GFRNONAA >60 08/02/2016 1013   GFRNONAA 56 (L)  12/31/2014 1023   GFRNONAA 57 (L) 06/24/2014 1300   GFRAA >60 08/02/2016 1013   GFRAA >60 12/31/2014 1023   GFRAA >60 06/24/2014 1300    No results found for: SPEP, UPEP  Lab Results  Component Value Date   WBC 8.6 08/02/2016   NEUTROABS 6.1 08/02/2016   HGB 14.1 08/02/2016   HCT  40.2 08/02/2016   MCV 94.3 08/02/2016   PLT 279 08/02/2016      Chemistry      Component Value Date/Time   NA 137 08/02/2016 1013   NA 141 12/20/2013 0901   K 3.6 08/02/2016 1013   K 4.1 12/20/2013 0901   CL 103 08/02/2016 1013   CL 106 12/18/2012 0842   CO2 27 08/02/2016 1013   CO2 27 12/20/2013 0901   BUN 17 08/02/2016 1013   BUN 23.2 12/20/2013 0901   CREATININE 0.86 08/02/2016 1013   CREATININE 1.04 12/31/2014 1023   CREATININE 1.0 12/20/2013 0901      Component Value Date/Time   CALCIUM 9.1 08/02/2016 1013   CALCIUM 10.1 12/20/2013 0901   ALKPHOS 108 08/02/2016 1013   ALKPHOS 135 (H) 12/31/2014 1023   ALKPHOS 144 12/20/2013 0901   AST 23 08/02/2016 1013   AST 24 12/31/2014 1023   AST 21 12/20/2013 0901   ALT 18 08/02/2016 1013   ALT 28 12/31/2014 1023   ALT 18 12/20/2013 0901   BILITOT 0.6 08/02/2016 1013   BILITOT 0.4 12/31/2014 1023   BILITOT 0.60 12/20/2013 0901       RADIOGRAPHIC STUDIES: I have personally reviewed the radiological images as listed and agreed with the findings in the report. No results found.   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 CT of the chest and pelvis-NED. Clinically NED.  Recommend continued surveillance.  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 June 2017-mammogram negative.  # History of DVT left lower extremity- while on chemotherapy/ tamoxifen- in 2010. Given the recent fall- recommend holding off coumadin.  # Left kidney cyst- monitor for now.  #patient follow-up with me in 6 months/CBC CMP CEA.   # 15 minutes face-to-face  with the patient discussing the above plan of care; more than 50% of time spent on prognosis/ natural history; counseling and coordination.        Cammie Sickle, MD 08/02/2016 1:24 PM

## 2016-08-03 LAB — CEA: CEA: 1.8 ng/mL (ref 0.0–4.7)

## 2016-08-16 ENCOUNTER — Telehealth: Payer: Self-pay | Admitting: Family Medicine

## 2016-08-16 NOTE — Telephone Encounter (Signed)
Patient cancelled her coumadin appointment.  Dr.Brahmanday,[facility],took patient off Coumadin.

## 2016-08-23 ENCOUNTER — Other Ambulatory Visit: Payer: Self-pay

## 2016-09-07 ENCOUNTER — Other Ambulatory Visit: Payer: Self-pay | Admitting: Family Medicine

## 2016-09-10 DIAGNOSIS — Z23 Encounter for immunization: Secondary | ICD-10-CM | POA: Diagnosis not present

## 2016-09-13 ENCOUNTER — Inpatient Hospital Stay: Payer: Medicare Other | Attending: Internal Medicine

## 2016-09-13 DIAGNOSIS — Z923 Personal history of irradiation: Secondary | ICD-10-CM | POA: Diagnosis not present

## 2016-09-13 DIAGNOSIS — Z86000 Personal history of in-situ neoplasm of breast: Secondary | ICD-10-CM | POA: Insufficient documentation

## 2016-09-13 DIAGNOSIS — Z9221 Personal history of antineoplastic chemotherapy: Secondary | ICD-10-CM | POA: Insufficient documentation

## 2016-09-13 DIAGNOSIS — Z452 Encounter for adjustment and management of vascular access device: Secondary | ICD-10-CM | POA: Insufficient documentation

## 2016-09-13 DIAGNOSIS — Z95828 Presence of other vascular implants and grafts: Secondary | ICD-10-CM

## 2016-09-13 DIAGNOSIS — Z9223 Personal history of estrogen therapy: Secondary | ICD-10-CM | POA: Insufficient documentation

## 2016-09-13 DIAGNOSIS — Z85038 Personal history of other malignant neoplasm of large intestine: Secondary | ICD-10-CM | POA: Diagnosis not present

## 2016-09-13 MED ORDER — SODIUM CHLORIDE 0.9% FLUSH
10.0000 mL | Freq: Once | INTRAVENOUS | Status: AC
  Administered 2016-09-13: 10 mL via INTRAVENOUS
  Filled 2016-09-13: qty 10

## 2016-09-13 MED ORDER — HEPARIN SOD (PORK) LOCK FLUSH 100 UNIT/ML IV SOLN
INTRAVENOUS | Status: AC
  Filled 2016-09-13: qty 5

## 2016-09-13 MED ORDER — HEPARIN SOD (PORK) LOCK FLUSH 100 UNIT/ML IV SOLN
500.0000 [IU] | Freq: Once | INTRAVENOUS | Status: AC
  Administered 2016-09-13: 500 [IU] via INTRAVENOUS

## 2016-10-21 ENCOUNTER — Telehealth: Payer: Self-pay | Admitting: Family Medicine

## 2016-10-21 DIAGNOSIS — M8589 Other specified disorders of bone density and structure, multiple sites: Secondary | ICD-10-CM

## 2016-10-21 DIAGNOSIS — E119 Type 2 diabetes mellitus without complications: Secondary | ICD-10-CM

## 2016-10-21 DIAGNOSIS — D509 Iron deficiency anemia, unspecified: Secondary | ICD-10-CM

## 2016-10-21 DIAGNOSIS — E7800 Pure hypercholesterolemia, unspecified: Secondary | ICD-10-CM

## 2016-10-21 NOTE — Telephone Encounter (Signed)
-----   Message from Ellamae Sia sent at 10/20/2016 10:21 AM EST ----- Regarding: Lab orders for 11.20.17  AWV lab orders, please.

## 2016-10-25 ENCOUNTER — Other Ambulatory Visit (INDEPENDENT_AMBULATORY_CARE_PROVIDER_SITE_OTHER): Payer: Medicare Other

## 2016-10-25 ENCOUNTER — Inpatient Hospital Stay: Payer: Medicare Other | Attending: Internal Medicine

## 2016-10-25 DIAGNOSIS — E119 Type 2 diabetes mellitus without complications: Secondary | ICD-10-CM | POA: Diagnosis not present

## 2016-10-25 DIAGNOSIS — Z95828 Presence of other vascular implants and grafts: Secondary | ICD-10-CM

## 2016-10-25 DIAGNOSIS — Z923 Personal history of irradiation: Secondary | ICD-10-CM | POA: Diagnosis not present

## 2016-10-25 DIAGNOSIS — Z85038 Personal history of other malignant neoplasm of large intestine: Secondary | ICD-10-CM | POA: Diagnosis not present

## 2016-10-25 DIAGNOSIS — Z9221 Personal history of antineoplastic chemotherapy: Secondary | ICD-10-CM | POA: Diagnosis not present

## 2016-10-25 DIAGNOSIS — Z9223 Personal history of estrogen therapy: Secondary | ICD-10-CM | POA: Diagnosis not present

## 2016-10-25 DIAGNOSIS — Z452 Encounter for adjustment and management of vascular access device: Secondary | ICD-10-CM | POA: Diagnosis not present

## 2016-10-25 DIAGNOSIS — E7800 Pure hypercholesterolemia, unspecified: Secondary | ICD-10-CM | POA: Diagnosis not present

## 2016-10-25 LAB — LIPID PANEL
CHOLESTEROL: 141 mg/dL (ref 0–200)
HDL: 40.1 mg/dL (ref >39.00)
LDL Cholesterol: 63 mg/dL (ref 0–99)
NONHDL: 100.92
TRIGLYCERIDES: 189 mg/dL — AB (ref 0.0–149.0)
Total CHOL/HDL Ratio: 4
VLDL: 37.8 mg/dL (ref 0.0–40.0)

## 2016-10-25 LAB — COMPREHENSIVE METABOLIC PANEL
ALBUMIN: 3.8 g/dL (ref 3.5–5.2)
ALK PHOS: 92 U/L (ref 39–117)
ALT: 17 U/L (ref 0–35)
AST: 19 U/L (ref 0–37)
BUN: 19 mg/dL (ref 6–23)
CALCIUM: 9.5 mg/dL (ref 8.4–10.5)
CO2: 25 mEq/L (ref 19–32)
Chloride: 105 mEq/L (ref 96–112)
Creatinine, Ser: 0.87 mg/dL (ref 0.40–1.20)
GFR: 67.93 mL/min (ref >60.00)
Glucose, Bld: 110 mg/dL — ABNORMAL HIGH (ref 70–99)
POTASSIUM: 4 meq/L (ref 3.5–5.1)
Sodium: 139 mEq/L (ref 135–145)
TOTAL PROTEIN: 7.7 g/dL (ref 6.0–8.3)
Total Bilirubin: 0.6 mg/dL (ref 0.2–1.2)

## 2016-10-25 LAB — HEMOGLOBIN A1C: HEMOGLOBIN A1C: 5.8 % (ref 4.6–6.5)

## 2016-10-25 MED ORDER — HEPARIN SOD (PORK) LOCK FLUSH 100 UNIT/ML IV SOLN
500.0000 [IU] | Freq: Once | INTRAVENOUS | Status: AC
  Administered 2016-10-25: 500 [IU] via INTRAVENOUS

## 2016-10-25 MED ORDER — SODIUM CHLORIDE 0.9% FLUSH
10.0000 mL | INTRAVENOUS | Status: DC | PRN
  Administered 2016-10-25: 10 mL via INTRAVENOUS
  Filled 2016-10-25: qty 10

## 2016-11-01 ENCOUNTER — Encounter: Payer: Self-pay | Admitting: Family Medicine

## 2016-11-01 ENCOUNTER — Ambulatory Visit (INDEPENDENT_AMBULATORY_CARE_PROVIDER_SITE_OTHER): Payer: Medicare Other | Admitting: Family Medicine

## 2016-11-01 VITALS — BP 104/74 | HR 72 | Temp 98.4°F | Ht 63.0 in | Wt 207.2 lb

## 2016-11-01 DIAGNOSIS — Z0000 Encounter for general adult medical examination without abnormal findings: Secondary | ICD-10-CM

## 2016-11-01 DIAGNOSIS — E119 Type 2 diabetes mellitus without complications: Secondary | ICD-10-CM | POA: Diagnosis not present

## 2016-11-01 DIAGNOSIS — E7800 Pure hypercholesterolemia, unspecified: Secondary | ICD-10-CM

## 2016-11-01 DIAGNOSIS — I10 Essential (primary) hypertension: Secondary | ICD-10-CM | POA: Diagnosis not present

## 2016-11-01 LAB — HM DIABETES FOOT EXAM

## 2016-11-01 NOTE — Progress Notes (Signed)
Pre visit review using our clinic review tool, if applicable. No additional management support is needed unless otherwise documented below in the visit note. 

## 2016-11-01 NOTE — Progress Notes (Signed)
70  year old female with metastatic colon carcinoma with lung and chest wall metastasis, hx of breast cancer, HTN, DVT ( on coumadin) and DM  Presents for medicare wellness.  I have personally reviewed the Medicare Annual Wellness questionnaire and have noted 1.         The patient's medical and social history 2.         Their use of alcohol, tobacco or illicit drugs 3.         Their current medications and supplements 4.         The patient's functional ability including ADL's, fall risks, home safety risks and hearing or visual             impairment. 5.         Diet and physical activities 6.         Evidence for depression or mood disorders 7.         Updated provider list Cognitive evaluation was performed and recorded on pt medicare questionnaire form. The patients weight, height, BMI and visual acuity have been recorded in the chart  I have made referrals, counseling and provided education to the patient based review of the above and I have provided the pt with a written personalized care plan for preventive services.     Diabetes: well controlled on no medicaiton  Lab Results  Component Value Date   HGBA1C 5.8 10/25/2016  Hpoglycemic episodes:?  Hyperglycemic episodes:?  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 Other complaints:   Hypertension: Well controlled on avapro   BP Readings from Last 3 Encounters:  11/01/16 104/74  08/02/16 (!) 152/85  04/23/16 140/90  Using medication without problems or  lightheadedness: None  Chest pain with exertion: None  Edema: stable Short of breath:Stable.Marland Kitchen No changes  Average home BPs: At goal 115-120/70s Other issues:   Social History /Family History/Past Medical History reviewed and updated if needed.oncologist Patient Care Team: Jinny Sanders, MD as PCP - General   Dr. Page Spiro,  following CEA  Dr. Collene Mares: GI  Review of Systems  Constitutional: Negative for fever, fatigue and unexpected weight change.  HENT: Negative for congestion, sore throat, sneezing and trouble swallowing.  Eyes: Negative for pain and itching.  Respiratory: Negative for cough, chest tightness, shortness of breath and wheezing.  Cardiovascular: Negative for chest pain, palpitations and leg swelling.  Gastrointestinal: negative for constiaption. Negative for nausea, abdominal pain, diarrhea and blood in stool.  Genitourinary: Negative for dysuria, hematuria, vaginal discharge, difficulty urinating and menstrual problem.  Skin: Negative for rash.  Neurological: Negative for syncope, weakness, light-headedness, numbness and headaches.  Psychiatric/Behavioral: Negative for confusion and dysphoric mood. The patient is not nervous/anxious.  Objective:   Physical Exam GEN: nad, alert and oriented HEENT: mucous membranes moist, PERRLA NECK: supple w/o LA CV: rrr. no murmur PULM: ctab, no inc wob ABD: soft, +bs EXT: no edema SKIN: no acute rash GYN: ext nml, no adnexal ttp, no uterine or ovarian enlargement, NO PAP  PERFORMED Breast exam: scar on left upper breast, firm some nodules associated not mobile.. Right breast nml, neg axillary lymph nodes bilaterally.  Diabetic foot exam: Normal inspection No skin breakdown No calluses  Normal DP pulses Normal sensation to light touch and monofilament Nails normal  Assessment & Plan:   The patient's preventative maintenance and recommended screening tests for an annual wellness exam were reviewed in  full today.  Brought up to date unless services declined.  Counselled on the importance of diet, exercise, and its role in overall health and mortality.  The patient's FH and SH was reviewed, including their home life, tobacco status, and drug and alcohol status.   Vaccines:  Uptodate except, considering shingles.  Mammo: Hx of breast cancer. Stable 05/2016 Bone density:02/2013 osteopenia, repeat in 5 years Colon: history of stage 4 colon cancer... Per GI Dr. Collene Mares, colonoscopy 02/2013 Negative.. Repeat in 3-5 years.Marland Kitchen PAP/DVE: DVE, pap not indicated, no symptoms.  Hep C completed  Nonsmoker.  Reviewed advanced directives.

## 2016-12-07 ENCOUNTER — Inpatient Hospital Stay: Payer: Medicare Other | Attending: Internal Medicine

## 2016-12-07 DIAGNOSIS — Z85038 Personal history of other malignant neoplasm of large intestine: Secondary | ICD-10-CM | POA: Diagnosis not present

## 2016-12-07 DIAGNOSIS — Z9223 Personal history of estrogen therapy: Secondary | ICD-10-CM | POA: Diagnosis not present

## 2016-12-07 DIAGNOSIS — Z452 Encounter for adjustment and management of vascular access device: Secondary | ICD-10-CM | POA: Diagnosis not present

## 2016-12-07 DIAGNOSIS — Z86000 Personal history of in-situ neoplasm of breast: Secondary | ICD-10-CM | POA: Insufficient documentation

## 2016-12-07 DIAGNOSIS — Z923 Personal history of irradiation: Secondary | ICD-10-CM | POA: Diagnosis not present

## 2016-12-07 DIAGNOSIS — Z95828 Presence of other vascular implants and grafts: Secondary | ICD-10-CM

## 2016-12-07 DIAGNOSIS — Z9221 Personal history of antineoplastic chemotherapy: Secondary | ICD-10-CM | POA: Diagnosis not present

## 2016-12-07 MED ORDER — HEPARIN SOD (PORK) LOCK FLUSH 100 UNIT/ML IV SOLN
500.0000 [IU] | Freq: Once | INTRAVENOUS | Status: AC
  Administered 2016-12-07: 500 [IU] via INTRAVENOUS

## 2016-12-07 MED ORDER — SODIUM CHLORIDE 0.9% FLUSH
10.0000 mL | INTRAVENOUS | Status: DC | PRN
  Administered 2016-12-07: 10 mL via INTRAVENOUS
  Filled 2016-12-07: qty 10

## 2016-12-25 NOTE — Assessment & Plan Note (Signed)
Almost at goal <70 with CVA history on simvastatin

## 2016-12-25 NOTE — Assessment & Plan Note (Signed)
Well controlled with diet. 

## 2016-12-25 NOTE — Assessment & Plan Note (Signed)
Well controlled. Continue current medication.  

## 2017-01-18 ENCOUNTER — Inpatient Hospital Stay: Payer: Medicare Other | Attending: Internal Medicine

## 2017-01-18 DIAGNOSIS — Z801 Family history of malignant neoplasm of trachea, bronchus and lung: Secondary | ICD-10-CM | POA: Diagnosis not present

## 2017-01-18 DIAGNOSIS — I10 Essential (primary) hypertension: Secondary | ICD-10-CM | POA: Insufficient documentation

## 2017-01-18 DIAGNOSIS — Z9223 Personal history of estrogen therapy: Secondary | ICD-10-CM | POA: Diagnosis not present

## 2017-01-18 DIAGNOSIS — C187 Malignant neoplasm of sigmoid colon: Secondary | ICD-10-CM

## 2017-01-18 DIAGNOSIS — E785 Hyperlipidemia, unspecified: Secondary | ICD-10-CM | POA: Insufficient documentation

## 2017-01-18 DIAGNOSIS — Z86718 Personal history of other venous thrombosis and embolism: Secondary | ICD-10-CM | POA: Diagnosis not present

## 2017-01-18 DIAGNOSIS — Z7901 Long term (current) use of anticoagulants: Secondary | ICD-10-CM | POA: Insufficient documentation

## 2017-01-18 DIAGNOSIS — Z880 Allergy status to penicillin: Secondary | ICD-10-CM | POA: Diagnosis not present

## 2017-01-18 DIAGNOSIS — Z9221 Personal history of antineoplastic chemotherapy: Secondary | ICD-10-CM | POA: Insufficient documentation

## 2017-01-18 DIAGNOSIS — Z95828 Presence of other vascular implants and grafts: Secondary | ICD-10-CM

## 2017-01-18 DIAGNOSIS — Z803 Family history of malignant neoplasm of breast: Secondary | ICD-10-CM | POA: Insufficient documentation

## 2017-01-18 DIAGNOSIS — Z7982 Long term (current) use of aspirin: Secondary | ICD-10-CM | POA: Diagnosis not present

## 2017-01-18 DIAGNOSIS — Z79899 Other long term (current) drug therapy: Secondary | ICD-10-CM | POA: Diagnosis not present

## 2017-01-18 DIAGNOSIS — Z800 Family history of malignant neoplasm of digestive organs: Secondary | ICD-10-CM | POA: Diagnosis not present

## 2017-01-18 DIAGNOSIS — N281 Cyst of kidney, acquired: Secondary | ICD-10-CM | POA: Diagnosis not present

## 2017-01-18 DIAGNOSIS — Z923 Personal history of irradiation: Secondary | ICD-10-CM | POA: Diagnosis not present

## 2017-01-18 DIAGNOSIS — Z85038 Personal history of other malignant neoplasm of large intestine: Secondary | ICD-10-CM | POA: Diagnosis not present

## 2017-01-18 DIAGNOSIS — Z86000 Personal history of in-situ neoplasm of breast: Secondary | ICD-10-CM | POA: Insufficient documentation

## 2017-01-18 DIAGNOSIS — Z8673 Personal history of transient ischemic attack (TIA), and cerebral infarction without residual deficits: Secondary | ICD-10-CM | POA: Insufficient documentation

## 2017-01-18 LAB — CBC WITH DIFFERENTIAL/PLATELET
Basophils Absolute: 0.1 10*3/uL (ref 0–0.1)
Basophils Relative: 1 %
Eosinophils Absolute: 0.2 10*3/uL (ref 0–0.7)
Eosinophils Relative: 2 %
HEMATOCRIT: 41.2 % (ref 35.0–47.0)
Hemoglobin: 14.4 g/dL (ref 12.0–16.0)
LYMPHS PCT: 14 %
Lymphs Abs: 1.2 10*3/uL (ref 1.0–3.6)
MCH: 33.4 pg (ref 26.0–34.0)
MCHC: 34.9 g/dL (ref 32.0–36.0)
MCV: 95.8 fL (ref 80.0–100.0)
MONO ABS: 0.8 10*3/uL (ref 0.2–0.9)
MONOS PCT: 9 %
NEUTROS ABS: 6.7 10*3/uL — AB (ref 1.4–6.5)
Neutrophils Relative %: 74 %
Platelets: 290 10*3/uL (ref 150–440)
RBC: 4.3 MIL/uL (ref 3.80–5.20)
RDW: 13.1 % (ref 11.5–14.5)
WBC: 8.9 10*3/uL (ref 3.6–11.0)

## 2017-01-18 LAB — COMPREHENSIVE METABOLIC PANEL
ALT: 17 U/L (ref 14–54)
ANION GAP: 7 (ref 5–15)
AST: 24 U/L (ref 15–41)
Albumin: 3.7 g/dL (ref 3.5–5.0)
Alkaline Phosphatase: 101 U/L (ref 38–126)
BILIRUBIN TOTAL: 0.7 mg/dL (ref 0.3–1.2)
BUN: 23 mg/dL — AB (ref 6–20)
CO2: 25 mmol/L (ref 22–32)
Calcium: 9.1 mg/dL (ref 8.9–10.3)
Chloride: 104 mmol/L (ref 101–111)
Creatinine, Ser: 0.91 mg/dL (ref 0.44–1.00)
GFR calc Af Amer: >60 mL/min (ref >60)
Glucose, Bld: 105 mg/dL — ABNORMAL HIGH (ref 65–99)
POTASSIUM: 3.8 mmol/L (ref 3.5–5.1)
Sodium: 136 mmol/L (ref 135–145)
Total Protein: 8.2 g/dL — ABNORMAL HIGH (ref 6.5–8.1)

## 2017-01-18 MED ORDER — HEPARIN SOD (PORK) LOCK FLUSH 100 UNIT/ML IV SOLN
500.0000 [IU] | Freq: Once | INTRAVENOUS | Status: AC
  Administered 2017-01-18: 500 [IU] via INTRAVENOUS

## 2017-01-18 MED ORDER — SODIUM CHLORIDE 0.9% FLUSH
10.0000 mL | INTRAVENOUS | Status: DC | PRN
  Administered 2017-01-18: 10 mL via INTRAVENOUS
  Filled 2017-01-18: qty 10

## 2017-01-19 LAB — CEA: CEA: 1.5 ng/mL (ref 0.0–4.7)

## 2017-02-02 ENCOUNTER — Inpatient Hospital Stay (HOSPITAL_BASED_OUTPATIENT_CLINIC_OR_DEPARTMENT_OTHER): Payer: Medicare Other | Admitting: Internal Medicine

## 2017-02-02 ENCOUNTER — Telehealth: Payer: Self-pay | Admitting: Internal Medicine

## 2017-02-02 ENCOUNTER — Other Ambulatory Visit: Payer: Self-pay

## 2017-02-02 VITALS — BP 148/83 | HR 73 | Temp 96.5°F | Resp 18 | Wt 204.5 lb

## 2017-02-02 DIAGNOSIS — Z800 Family history of malignant neoplasm of digestive organs: Secondary | ICD-10-CM

## 2017-02-02 DIAGNOSIS — I10 Essential (primary) hypertension: Secondary | ICD-10-CM

## 2017-02-02 DIAGNOSIS — C187 Malignant neoplasm of sigmoid colon: Secondary | ICD-10-CM

## 2017-02-02 DIAGNOSIS — Z9221 Personal history of antineoplastic chemotherapy: Secondary | ICD-10-CM

## 2017-02-02 DIAGNOSIS — Z85038 Personal history of other malignant neoplasm of large intestine: Secondary | ICD-10-CM | POA: Diagnosis not present

## 2017-02-02 DIAGNOSIS — Z880 Allergy status to penicillin: Secondary | ICD-10-CM | POA: Diagnosis not present

## 2017-02-02 DIAGNOSIS — E785 Hyperlipidemia, unspecified: Secondary | ICD-10-CM

## 2017-02-02 DIAGNOSIS — Z923 Personal history of irradiation: Secondary | ICD-10-CM | POA: Diagnosis not present

## 2017-02-02 DIAGNOSIS — Z9223 Personal history of estrogen therapy: Secondary | ICD-10-CM | POA: Diagnosis not present

## 2017-02-02 DIAGNOSIS — Z7901 Long term (current) use of anticoagulants: Secondary | ICD-10-CM

## 2017-02-02 DIAGNOSIS — Z7982 Long term (current) use of aspirin: Secondary | ICD-10-CM

## 2017-02-02 DIAGNOSIS — Z86000 Personal history of in-situ neoplasm of breast: Secondary | ICD-10-CM

## 2017-02-02 DIAGNOSIS — Z8673 Personal history of transient ischemic attack (TIA), and cerebral infarction without residual deficits: Secondary | ICD-10-CM

## 2017-02-02 DIAGNOSIS — Z86718 Personal history of other venous thrombosis and embolism: Secondary | ICD-10-CM | POA: Diagnosis not present

## 2017-02-02 DIAGNOSIS — Z79899 Other long term (current) drug therapy: Secondary | ICD-10-CM | POA: Diagnosis not present

## 2017-02-02 DIAGNOSIS — Z801 Family history of malignant neoplasm of trachea, bronchus and lung: Secondary | ICD-10-CM

## 2017-02-02 DIAGNOSIS — Z803 Family history of malignant neoplasm of breast: Secondary | ICD-10-CM

## 2017-02-02 DIAGNOSIS — N281 Cyst of kidney, acquired: Secondary | ICD-10-CM

## 2017-02-02 NOTE — Telephone Encounter (Signed)
msg sent to [facility] in cancer [facility]. to arrange

## 2017-02-02 NOTE — Assessment & Plan Note (Signed)
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 Feb 2017 CT of the chest and pelvis-NED. Clinically NED.  Recommend continued surveillance with CT scans in 6 months.  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 June 2017-mammogram negative.  # History of DVT left lower extremity- while on chemotherapy/ tamoxifen- in 2010.  currently off coumadin.  # Left kidney cyst- monitor for now.  #patient follow-up with me in 6 months/CBC CMP CEA; CT scans prior.

## 2017-02-02 NOTE — Telephone Encounter (Signed)
Pt needs port flush every 8 weeks; Thx

## 2017-02-02 NOTE — Progress Notes (Signed)
[place] OFFICE PROGRESS NOTE  Patient Care Team: Jinny Sanders, MD as PCP - General   SUMMARY OF ONCOLOGIC HISTORY:  Oncology History   # 2005- COLON CA [Stage II; s/p Surgery in [facility] [place]]; no Adj chemo.   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   # Nov 2010-LLE DVT [on Tam] on coumadin; AUG 2017- STOP coumadin  # ; PN-G-1; port flush q 6 w  # Left kidney cyst [Feb 2017]     Cancer of sigmoid colon ([place])     INTERVAL HISTORY:  A very pleasant 71 year old female patient with above history of metastatic colon cancer currently NED is here for follow-up.  Appetite is good. No weight loss. Patient denies any blood in stools black colored stools. Denies any unusual chest pain or shortness of breath or cough. Patient denies any bleeding. No abdominal pain.  REVIEW OF SYSTEMS:  A complete 10 point review of system is done which is negative except mentioned above/history of present illness.   PAST MEDICAL HISTORY :  Past Medical History:  Diagnosis Date  . Allergy   . Breast CA ([place])   . Breast cancer ([place]) 2010   in situ and took radiation, no chemo needed  . Colon cancer ([place])    stage 4 colon cancer  . History of chemotherapy 02/2011-08/2011   FOLFOX/AVASTIN- 8 CYCLES  . History of DVT (deep vein thrombosis) 2010   while taking tamoxifen-left lower extremity  . History of peripheral neuropathy   . Hyperlipidemia   . Hypertension   . IDA (iron deficiency anemia)   . Osteoarthritis   . TIA (transient ischemic attack) 03/08/2011  . Vitamin D deficiency     PAST SURGICAL HISTORY :   Past Surgical History:  Procedure Laterality Date  . BREAST BIOPSY  1-10   fibroma removal, s/p lumpectomy and  radiation   . BREAST LUMPECTOMY  2010   left breast  . broken ankle  Left 08/2012  . COLON SURGERY     colectomy, partial  . LOBECTOMY  06-2009   metastatic colon cancer    FAMILY HISTORY :   Family History  Problem Relation Age of Onset  . Hypertension Mother   . Stroke Mother   . Coronary artery disease Mother   . Thyroid disease Mother   . Diabetes Father   . Hypertension Father   . Lung cancer Father   . Thyroid disease Brother   . Heart attack Maternal Grandfather   . Diabetes Paternal Grandmother   . Colon cancer Paternal Grandfather   . Deep vein thrombosis    . Breast cancer Maternal Grandmother     SOCIAL HISTORY:   Social History  Substance Use Topics  . Smoking status: Never Smoker  . Smokeless tobacco: Never Used  . Alcohol use No    ALLERGIES:  is allergic to amoxicillin and erythromycin.  MEDICATIONS:  Current Outpatient Prescriptions  Medication Sig Dispense Refill  . acetaminophen (TYLENOL) 500 MG tablet as needed. Take 1-2 by mouth every 6 hours as needed     . aspirin 81 MG chewable tablet Chew 81 mg by mouth daily.    . cholecalciferol (VITAMIN D) 400 UNITS TABS tablet Take 400 Units by mouth 2 (  two) times daily.    . Cyanocobalamin (VITAMIN B-12) 2000 MCG TBCR Take 1 tablet by mouth daily.      . irbesartan (AVAPRO) 150 MG tablet TAKE 1 TABLET BY MOUTH EVERY DAY 90 tablet 1  . lidocaine-prilocaine (EMLA) cream Apply 1 application topically as needed. To port a cath site approximately 1 to 2 hours prior to port needle insertion. 30 g 12  . simvastatin (ZOCOR) 40 MG tablet Take 1 tablet (40 mg total) by mouth at bedtime. 90 tablet 3   No current facility-administered medications for this visit.    Facility-Administered Medications Ordered in Other Visits  Medication Dose Route Frequency Provider Last Rate Last Dose  . sodium chloride flush (NS) 0.9 % injection 10 mL  10 mL Intravenous PRN Cammie Sickle, MD   10 mL at 01/05/16 1135     PHYSICAL EXAMINATION: ECOG PERFORMANCE STATUS:0  BP (!) 148/83 (BP Location: Right Arm, Patient Position: Sitting)   Pulse 73   Temp (!) 96.5 F (35.8 C) (Tympanic)   Resp 18   Wt 204 lb 8 oz (92.8 kg)   BMI 36.23 kg/m   Filed Weights   02/02/17 1111  Weight: 204 lb 8 oz (92.8 kg)    GENERAL: Well-nourished well-developed; Alert, no distress and comfortable.  Obese; walks with a cane. Able to sit on the exam table by herself.   LABORATORY DATA:  I have reviewed the data as listed    Component Value Date/Time   NA 136 01/18/2017 1005   NA 141 12/20/2013 0901   K 3.8 01/18/2017 1005   K 4.1 12/20/2013 0901   CL 104 01/18/2017 1005   CL 106 12/18/2012 0842   CO2 25 01/18/2017 1005   CO2 27 12/20/2013 0901   GLUCOSE 105 (H) 01/18/2017 1005   GLUCOSE 103 12/20/2013 0901   GLUCOSE 109 (H) 12/18/2012 0842   BUN 23 (H) 01/18/2017 1005   BUN 23.2 12/20/2013 0901   CREATININE 0.91 01/18/2017 1005   CREATININE 1.04 12/31/2014 1023   CREATININE 1.0 12/20/2013 0901   CALCIUM 9.1 01/18/2017 1005   CALCIUM 10.1 12/20/2013 0901   PROT 8.2 (H) 01/18/2017 1005   PROT 7.9 12/31/2014 1023   PROT 8.5 (H) 12/20/2013 0901   ALBUMIN 3.7 01/18/2017 1005   ALBUMIN 3.3 (L) 12/31/2014 1023   ALBUMIN 3.7 12/20/2013 0901   AST 24 01/18/2017 1005   AST 24 12/31/2014 1023   AST 21 12/20/2013 0901   ALT 17 01/18/2017 1005   ALT 28 12/31/2014 1023   ALT 18 12/20/2013 0901   ALKPHOS 101 01/18/2017 1005   ALKPHOS 135 (H) 12/31/2014 1023   ALKPHOS 144 12/20/2013 0901   BILITOT 0.7 01/18/2017 1005   BILITOT 0.4 12/31/2014 1023   BILITOT 0.60 12/20/2013 0901   GFRNONAA >60 01/18/2017 1005   GFRNONAA 56 (L) 12/31/2014 1023   GFRNONAA 57 (L) 06/24/2014 1300   GFRAA >60 01/18/2017 1005   GFRAA >60 12/31/2014 1023   GFRAA >60 06/24/2014 1300    No results found for: SPEP, UPEP  Lab Results  Component Value Date   WBC 8.9 01/18/2017   NEUTROABS 6.7 (H) 01/18/2017   HGB 14.4  01/18/2017   HCT 41.2 01/18/2017   MCV 95.8 01/18/2017   PLT 290 01/18/2017      Chemistry      Component Value Date/Time   NA 136 01/18/2017 1005   NA 141 12/20/2013 0901   K 3.8 01/18/2017 1005   K 4.1  12/20/2013 0901   CL 104 01/18/2017 1005   CL 106 12/18/2012 0842   CO2 25 01/18/2017 1005   CO2 27 12/20/2013 0901   BUN 23 (H) 01/18/2017 1005   BUN 23.2 12/20/2013 0901   CREATININE 0.91 01/18/2017 1005   CREATININE 1.04 12/31/2014 1023   CREATININE 1.0 12/20/2013 0901      Component Value Date/Time   CALCIUM 9.1 01/18/2017 1005   CALCIUM 10.1 12/20/2013 0901   ALKPHOS 101 01/18/2017 1005   ALKPHOS 135 (H) 12/31/2014 1023   ALKPHOS 144 12/20/2013 0901   AST 24 01/18/2017 1005   AST 24 12/31/2014 1023   AST 21 12/20/2013 0901   ALT 17 01/18/2017 1005   ALT 28 12/31/2014 1023   ALT 18 12/20/2013 0901   BILITOT 0.7 01/18/2017 1005   BILITOT 0.4 12/31/2014 1023   BILITOT 0.60 12/20/2013 0901       RADIOGRAPHIC STUDIES: I have personally reviewed the radiological images as listed and agreed with the findings in the report. No results found.   ASSESSMENT & PLAN:   Cancer of sigmoid colon [facility]) # Metastatic colon cancer chest wall lung recurrence/ status post lobectomy- 2010. Feb 2017 CT of the chest and pelvis-NED. Clinically NED.  Recommend continued surveillance with CT scans in 6 months.  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 June 2017-mammogram negative.  # History of DVT left lower extremity- while on chemotherapy/ tamoxifen- in 2010.  currently off coumadin.  # Left kidney cyst- monitor for now.  #patient follow-up with me in 6 months/CBC CMP CEA; CT scans prior.         Cammie Sickle, MD 02/02/2017 1:05 PM

## 2017-02-02 NOTE — Progress Notes (Signed)
Patient here today for follow up.  Patient states no new concerns today  

## 2017-03-04 ENCOUNTER — Other Ambulatory Visit: Payer: Self-pay | Admitting: Family Medicine

## 2017-03-15 ENCOUNTER — Inpatient Hospital Stay: Payer: Medicare Other | Attending: Internal Medicine

## 2017-03-15 DIAGNOSIS — Z923 Personal history of irradiation: Secondary | ICD-10-CM | POA: Insufficient documentation

## 2017-03-15 DIAGNOSIS — Z85038 Personal history of other malignant neoplasm of large intestine: Secondary | ICD-10-CM | POA: Insufficient documentation

## 2017-03-15 DIAGNOSIS — Z9223 Personal history of estrogen therapy: Secondary | ICD-10-CM | POA: Diagnosis not present

## 2017-03-15 DIAGNOSIS — Z86000 Personal history of in-situ neoplasm of breast: Secondary | ICD-10-CM | POA: Insufficient documentation

## 2017-03-15 DIAGNOSIS — Z452 Encounter for adjustment and management of vascular access device: Secondary | ICD-10-CM | POA: Insufficient documentation

## 2017-03-15 DIAGNOSIS — Z9221 Personal history of antineoplastic chemotherapy: Secondary | ICD-10-CM | POA: Diagnosis not present

## 2017-03-15 DIAGNOSIS — Z95828 Presence of other vascular implants and grafts: Secondary | ICD-10-CM

## 2017-03-15 MED ORDER — SODIUM CHLORIDE 0.9% FLUSH
10.0000 mL | INTRAVENOUS | Status: DC | PRN
  Administered 2017-03-15: 10 mL via INTRAVENOUS
  Filled 2017-03-15: qty 10

## 2017-03-15 MED ORDER — HEPARIN SOD (PORK) LOCK FLUSH 100 UNIT/ML IV SOLN
500.0000 [IU] | Freq: Once | INTRAVENOUS | Status: AC
  Administered 2017-03-15: 500 [IU] via INTRAVENOUS

## 2017-04-29 ENCOUNTER — Telehealth: Payer: Self-pay | Admitting: Family Medicine

## 2017-04-29 ENCOUNTER — Other Ambulatory Visit (INDEPENDENT_AMBULATORY_CARE_PROVIDER_SITE_OTHER): Payer: Medicare Other

## 2017-04-29 DIAGNOSIS — E119 Type 2 diabetes mellitus without complications: Secondary | ICD-10-CM

## 2017-04-29 LAB — COMPREHENSIVE METABOLIC PANEL
ALT: 17 U/L (ref 0–35)
AST: 20 U/L (ref 0–37)
Albumin: 3.8 g/dL (ref 3.5–5.2)
Alkaline Phosphatase: 108 U/L (ref 39–117)
BUN: 15 mg/dL (ref 6–23)
CALCIUM: 9.4 mg/dL (ref 8.4–10.5)
CHLORIDE: 106 meq/L (ref 96–112)
CO2: 28 meq/L (ref 19–32)
Creatinine, Ser: 0.77 mg/dL (ref 0.40–1.20)
GFR: 78.09 mL/min (ref >60.00)
Glucose, Bld: 100 mg/dL — ABNORMAL HIGH (ref 70–99)
Potassium: 4.1 mEq/L (ref 3.5–5.1)
Sodium: 139 mEq/L (ref 135–145)
Total Bilirubin: 0.4 mg/dL (ref 0.2–1.2)
Total Protein: 7.5 g/dL (ref 6.0–8.3)

## 2017-04-29 LAB — LIPID PANEL
CHOLESTEROL: 142 mg/dL (ref 0–200)
HDL: 35.7 mg/dL — AB (ref >39.00)
LDL Cholesterol: 68 mg/dL (ref 0–99)
NONHDL: 106.24
Total CHOL/HDL Ratio: 4
Triglycerides: 190 mg/dL — ABNORMAL HIGH (ref 0.0–149.0)
VLDL: 38 mg/dL (ref 0.0–40.0)

## 2017-04-29 LAB — HEMOGLOBIN A1C: Hgb A1c MFr Bld: 5.9 % (ref 4.6–6.5)

## 2017-04-29 NOTE — Telephone Encounter (Signed)
-----   Message from Marchia Bond sent at 04/29/2017 11:34 AM EDT ----- Regarding: Pt had labs this am, need orders ASAP!! Thanks PT had labs this am, need orders ASAP!  Thanks Aniceto Boss

## 2017-05-03 ENCOUNTER — Ambulatory Visit (INDEPENDENT_AMBULATORY_CARE_PROVIDER_SITE_OTHER): Payer: Medicare Other | Admitting: Family Medicine

## 2017-05-03 ENCOUNTER — Encounter: Payer: Self-pay | Admitting: Family Medicine

## 2017-05-03 VITALS — BP 110/70 | HR 72 | Temp 98.4°F | Ht 63.0 in | Wt 206.0 lb

## 2017-05-03 DIAGNOSIS — I10 Essential (primary) hypertension: Secondary | ICD-10-CM | POA: Diagnosis not present

## 2017-05-03 DIAGNOSIS — E119 Type 2 diabetes mellitus without complications: Secondary | ICD-10-CM

## 2017-05-03 DIAGNOSIS — E7800 Pure hypercholesterolemia, unspecified: Secondary | ICD-10-CM

## 2017-05-03 LAB — HM DIABETES FOOT EXAM

## 2017-05-03 NOTE — Assessment & Plan Note (Signed)
Good control on moderate intensity statin. No SE to simvastatin.

## 2017-05-03 NOTE — Patient Instructions (Addendum)
Make sure to keep up with yearly eye exam.  Keep up the great work with healthy eating and regular walking.

## 2017-05-03 NOTE — Assessment & Plan Note (Signed)
Excellent control with diet

## 2017-05-03 NOTE — Assessment & Plan Note (Signed)
Well controlled. Continue current medication. Encouraged exercise, weight loss, healthy eating habits.  

## 2017-05-03 NOTE — Progress Notes (Signed)
Subjective:    Patient ID: Susan Duffy, female    DOB: [birth year], 71 y.o.   MRN: [id number]  HPI   71 year old female presents for  6 month follow up DM and high cholesterol. Hx of metastatic colon carcinoma with lung and chest wall metastasis, hx of breast cancer, HTN, DVT ( on coumadin)  Diabetes: Good control with diet.   Lab Results  Component Value Date   HGBA1C 5.9 04/29/2017  Using medications without difficulties: Feet problems: none Blood Sugars averaging: not checking eye exam within last year: due  Elevated Cholesterol:  Excellent control on simvastatin 40 mg daily. Lab Results  Component Value Date   CHOL 142 04/29/2017   HDL 35.70 (L) 04/29/2017   LDLCALC 68 04/29/2017   LDLDIRECT 83.0 10/16/2015   TRIG 190.0 (H) 04/29/2017   CHOLHDL 4 04/29/2017  Using medications without problems: None Muscle aches: None Diet compliance: low chol low carb diet Exercise: walking Other complaints:  Hypertension:    Good control on  avapro BP Readings from Last 3 Encounters:  05/03/17 110/70  02/02/17 (!) 148/83  11/01/16 104/74  Using medication without problems or lightheadedness: none Chest pain with exertion: none Edema: left ankle chronically Short of breath: none Average home BPs: good control Other issues:     Review of Systems  Constitutional: Positive for fatigue. Negative for fever.  HENT: Negative for ear pain.   Eyes: Negative for pain.  Respiratory: Negative for chest tightness and shortness of breath.   Cardiovascular: Negative for chest pain, palpitations and leg swelling.  Gastrointestinal: Negative for abdominal pain.  Genitourinary: Negative for dysuria.       Objective:   Physical Exam  Constitutional: Vital signs are normal. She appears well-developed and well-nourished. She is cooperative.  Non-toxic appearance. She does not appear ill. No distress.  HENT:  Head: Normocephalic.  Right Ear: Hearing, tympanic membrane,  external ear and ear canal normal. Tympanic membrane is not erythematous, not retracted and not bulging.  Left Ear: Hearing, tympanic membrane, external ear and ear canal normal. Tympanic membrane is not erythematous, not retracted and not bulging.  Nose: No mucosal edema or rhinorrhea. Right sinus exhibits no maxillary sinus tenderness and no frontal sinus tenderness. Left sinus exhibits no maxillary sinus tenderness and no frontal sinus tenderness.  Mouth/Throat: Uvula is midline, oropharynx is clear and moist and mucous membranes are normal.  Eyes: Conjunctivae, EOM and lids are normal. Pupils are equal, round, and reactive to light. Lids are everted and swept, no foreign bodies found.  Neck: Trachea normal and normal range of motion. Neck supple. Carotid bruit is not present. No thyroid mass and no thyromegaly present.  Cardiovascular: Normal rate, regular rhythm, S1 normal, S2 normal, normal heart sounds, intact distal pulses and normal pulses.  Exam reveals no gallop and no friction rub.   No murmur heard. Pulmonary/Chest: Effort normal and breath sounds normal. No tachypnea. No respiratory distress. She has no decreased breath sounds. She has no wheezes. She has no rhonchi. She has no rales.  Abdominal: Soft. Normal appearance and bowel sounds are normal. There is no tenderness.  Neurological: She is alert.  Skin: Skin is warm, dry and intact. No rash noted.  Psychiatric: Her speech is normal and behavior is normal. Judgment and thought content normal. Her mood appears not anxious. Cognition and memory are normal. She does not exhibit a depressed mood.         Diabetic foot exam: Normal inspection No skin  breakdown No calluses  Normal DP pulses Normal sensation to light touch and monofilament Nails normal  Assessment & Plan:

## 2017-05-10 ENCOUNTER — Inpatient Hospital Stay: Payer: Medicare Other | Attending: Internal Medicine

## 2017-05-10 DIAGNOSIS — Z923 Personal history of irradiation: Secondary | ICD-10-CM | POA: Insufficient documentation

## 2017-05-10 DIAGNOSIS — Z9223 Personal history of estrogen therapy: Secondary | ICD-10-CM | POA: Diagnosis not present

## 2017-05-10 DIAGNOSIS — Z86000 Personal history of in-situ neoplasm of breast: Secondary | ICD-10-CM | POA: Insufficient documentation

## 2017-05-10 DIAGNOSIS — Z452 Encounter for adjustment and management of vascular access device: Secondary | ICD-10-CM | POA: Insufficient documentation

## 2017-05-10 DIAGNOSIS — Z9221 Personal history of antineoplastic chemotherapy: Secondary | ICD-10-CM | POA: Diagnosis not present

## 2017-05-10 DIAGNOSIS — Z85038 Personal history of other malignant neoplasm of large intestine: Secondary | ICD-10-CM | POA: Diagnosis not present

## 2017-05-10 DIAGNOSIS — Z95828 Presence of other vascular implants and grafts: Secondary | ICD-10-CM

## 2017-05-10 MED ORDER — SODIUM CHLORIDE 0.9% FLUSH
10.0000 mL | INTRAVENOUS | Status: DC | PRN
  Administered 2017-05-10: 10 mL via INTRAVENOUS
  Filled 2017-05-10: qty 10

## 2017-05-10 MED ORDER — HEPARIN SOD (PORK) LOCK FLUSH 100 UNIT/ML IV SOLN
500.0000 [IU] | Freq: Once | INTRAVENOUS | Status: AC
  Administered 2017-05-10: 500 [IU] via INTRAVENOUS

## 2017-06-01 ENCOUNTER — Other Ambulatory Visit: Payer: Self-pay | Admitting: Family Medicine

## 2017-06-17 ENCOUNTER — Inpatient Hospital Stay: Payer: Medicare Other | Attending: Internal Medicine

## 2017-06-17 DIAGNOSIS — Z452 Encounter for adjustment and management of vascular access device: Secondary | ICD-10-CM | POA: Diagnosis not present

## 2017-06-17 DIAGNOSIS — Z9221 Personal history of antineoplastic chemotherapy: Secondary | ICD-10-CM | POA: Diagnosis not present

## 2017-06-17 DIAGNOSIS — Z85038 Personal history of other malignant neoplasm of large intestine: Secondary | ICD-10-CM | POA: Diagnosis not present

## 2017-06-17 DIAGNOSIS — Z923 Personal history of irradiation: Secondary | ICD-10-CM | POA: Diagnosis not present

## 2017-06-17 DIAGNOSIS — Z9223 Personal history of estrogen therapy: Secondary | ICD-10-CM | POA: Insufficient documentation

## 2017-06-17 DIAGNOSIS — Z86000 Personal history of in-situ neoplasm of breast: Secondary | ICD-10-CM | POA: Diagnosis not present

## 2017-06-17 DIAGNOSIS — Z95828 Presence of other vascular implants and grafts: Secondary | ICD-10-CM

## 2017-06-17 MED ORDER — HEPARIN SOD (PORK) LOCK FLUSH 100 UNIT/ML IV SOLN
500.0000 [IU] | Freq: Once | INTRAVENOUS | Status: AC
  Administered 2017-06-17: 500 [IU] via INTRAVENOUS

## 2017-06-17 MED ORDER — SODIUM CHLORIDE 0.9% FLUSH
10.0000 mL | INTRAVENOUS | Status: DC | PRN
Start: 2017-06-17 — End: 2017-06-17
  Administered 2017-06-17: 10 mL via INTRAVENOUS
  Filled 2017-06-17: qty 10

## 2017-07-07 DIAGNOSIS — H2513 Age-related nuclear cataract, bilateral: Secondary | ICD-10-CM | POA: Diagnosis not present

## 2017-07-07 LAB — HM DIABETES EYE EXAM

## 2017-08-02 ENCOUNTER — Ambulatory Visit
Admission: RE | Admit: 2017-08-02 | Discharge: 2017-08-02 | Disposition: A | Discharge status: Home / Self Care | Payer: Medicare Other | Source: Ambulatory Visit | Attending: Internal Medicine | Admitting: Internal Medicine

## 2017-08-02 DIAGNOSIS — C785 Secondary malignant neoplasm of large intestine and rectum: Secondary | ICD-10-CM | POA: Diagnosis not present

## 2017-08-02 DIAGNOSIS — C187 Malignant neoplasm of sigmoid colon: Secondary | ICD-10-CM | POA: Diagnosis not present

## 2017-08-02 DIAGNOSIS — D259 Leiomyoma of uterus, unspecified: Secondary | ICD-10-CM | POA: Diagnosis not present

## 2017-08-02 DIAGNOSIS — C801 Malignant (primary) neoplasm, unspecified: Secondary | ICD-10-CM | POA: Diagnosis not present

## 2017-08-02 DIAGNOSIS — C7989 Secondary malignant neoplasm of other specified sites: Secondary | ICD-10-CM | POA: Diagnosis not present

## 2017-08-02 LAB — POCT I-STAT CREATININE: CREATININE: 0.8 mg/dL (ref 0.44–1.00)

## 2017-08-02 MED ORDER — IOPAMIDOL (ISOVUE-300) INJECTION 61%
100.0000 mL | Freq: Once | INTRAVENOUS | Status: AC | PRN
  Administered 2017-08-02: 100 mL via INTRAVENOUS

## 2017-08-03 ENCOUNTER — Inpatient Hospital Stay: Payer: Medicare Other | Attending: Internal Medicine | Admitting: Internal Medicine

## 2017-08-03 ENCOUNTER — Inpatient Hospital Stay: Payer: Medicare Other

## 2017-08-03 VITALS — BP 134/91 | HR 75 | Temp 97.6°F | Resp 20 | Ht 63.0 in | Wt 205.0 lb

## 2017-08-03 DIAGNOSIS — M1990 Unspecified osteoarthritis, unspecified site: Secondary | ICD-10-CM

## 2017-08-03 DIAGNOSIS — Z9221 Personal history of antineoplastic chemotherapy: Secondary | ICD-10-CM | POA: Diagnosis not present

## 2017-08-03 DIAGNOSIS — Z800 Family history of malignant neoplasm of digestive organs: Secondary | ICD-10-CM | POA: Diagnosis not present

## 2017-08-03 DIAGNOSIS — C187 Malignant neoplasm of sigmoid colon: Secondary | ICD-10-CM

## 2017-08-03 DIAGNOSIS — Z9223 Personal history of estrogen therapy: Secondary | ICD-10-CM

## 2017-08-03 DIAGNOSIS — Z923 Personal history of irradiation: Secondary | ICD-10-CM | POA: Diagnosis not present

## 2017-08-03 DIAGNOSIS — Z85118 Personal history of other malignant neoplasm of bronchus and lung: Secondary | ICD-10-CM

## 2017-08-03 DIAGNOSIS — Z86000 Personal history of in-situ neoplasm of breast: Secondary | ICD-10-CM | POA: Diagnosis not present

## 2017-08-03 DIAGNOSIS — Z8673 Personal history of transient ischemic attack (TIA), and cerebral infarction without residual deficits: Secondary | ICD-10-CM | POA: Insufficient documentation

## 2017-08-03 DIAGNOSIS — E785 Hyperlipidemia, unspecified: Secondary | ICD-10-CM | POA: Diagnosis not present

## 2017-08-03 DIAGNOSIS — D259 Leiomyoma of uterus, unspecified: Secondary | ICD-10-CM | POA: Diagnosis not present

## 2017-08-03 DIAGNOSIS — Z79899 Other long term (current) drug therapy: Secondary | ICD-10-CM | POA: Insufficient documentation

## 2017-08-03 DIAGNOSIS — I10 Essential (primary) hypertension: Secondary | ICD-10-CM | POA: Insufficient documentation

## 2017-08-03 DIAGNOSIS — Z803 Family history of malignant neoplasm of breast: Secondary | ICD-10-CM | POA: Diagnosis not present

## 2017-08-03 DIAGNOSIS — Z86718 Personal history of other venous thrombosis and embolism: Secondary | ICD-10-CM | POA: Diagnosis not present

## 2017-08-03 DIAGNOSIS — E559 Vitamin D deficiency, unspecified: Secondary | ICD-10-CM | POA: Diagnosis not present

## 2017-08-03 DIAGNOSIS — Z853 Personal history of malignant neoplasm of breast: Secondary | ICD-10-CM

## 2017-08-03 DIAGNOSIS — N281 Cyst of kidney, acquired: Secondary | ICD-10-CM | POA: Insufficient documentation

## 2017-08-03 DIAGNOSIS — Z880 Allergy status to penicillin: Secondary | ICD-10-CM

## 2017-08-03 DIAGNOSIS — Z85038 Personal history of other malignant neoplasm of large intestine: Secondary | ICD-10-CM | POA: Insufficient documentation

## 2017-08-03 DIAGNOSIS — Z7982 Long term (current) use of aspirin: Secondary | ICD-10-CM | POA: Diagnosis not present

## 2017-08-03 DIAGNOSIS — Z902 Acquired absence of lung [part of]: Secondary | ICD-10-CM | POA: Diagnosis not present

## 2017-08-03 DIAGNOSIS — Z801 Family history of malignant neoplasm of trachea, bronchus and lung: Secondary | ICD-10-CM | POA: Diagnosis not present

## 2017-08-03 DIAGNOSIS — C50919 Malignant neoplasm of unspecified site of unspecified female breast: Secondary | ICD-10-CM

## 2017-08-03 LAB — COMPREHENSIVE METABOLIC PANEL
ALBUMIN: 4.2 g/dL (ref 3.5–5.0)
ALK PHOS: 131 U/L — AB (ref 38–126)
ALT: 21 U/L (ref 14–54)
ANION GAP: 10 (ref 5–15)
AST: 25 U/L (ref 15–41)
BUN: 18 mg/dL (ref 6–20)
CHLORIDE: 105 mmol/L (ref 101–111)
CO2: 22 mmol/L (ref 22–32)
Calcium: 9.4 mg/dL (ref 8.9–10.3)
Creatinine, Ser: 0.83 mg/dL (ref 0.44–1.00)
GFR calc Af Amer: >60 mL/min (ref >60)
GFR calc non Af Amer: >60 mL/min (ref >60)
Glucose, Bld: 139 mg/dL — ABNORMAL HIGH (ref 65–99)
POTASSIUM: 4 mmol/L (ref 3.5–5.1)
SODIUM: 137 mmol/L (ref 135–145)
TOTAL PROTEIN: 8.4 g/dL — AB (ref 6.5–8.1)
Total Bilirubin: 0.7 mg/dL (ref 0.3–1.2)

## 2017-08-03 LAB — CBC WITH DIFFERENTIAL/PLATELET
BASOS ABS: 0 10*3/uL (ref 0–0.1)
BASOS PCT: 1 %
EOS ABS: 0.2 10*3/uL (ref 0–0.7)
Eosinophils Relative: 2 %
HCT: 43.4 % (ref 35.0–47.0)
HEMOGLOBIN: 15 g/dL (ref 12.0–16.0)
LYMPHS ABS: 1.3 10*3/uL (ref 1.0–3.6)
Lymphocytes Relative: 15 %
MCH: 32.9 pg (ref 26.0–34.0)
MCHC: 34.6 g/dL (ref 32.0–36.0)
MCV: 95.1 fL (ref 80.0–100.0)
Monocytes Absolute: 0.7 10*3/uL (ref 0.2–0.9)
Monocytes Relative: 8 %
NEUTROS PCT: 74 %
Neutro Abs: 6.6 10*3/uL — ABNORMAL HIGH (ref 1.4–6.5)
Platelets: 286 10*3/uL (ref 150–440)
RBC: 4.57 MIL/uL (ref 3.80–5.20)
RDW: 12.8 % (ref 11.5–14.5)
WBC: 8.7 10*3/uL (ref 3.6–11.0)

## 2017-08-03 NOTE — Assessment & Plan Note (Addendum)
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 AUG 2018- CT of the chest and pelvis-NED- except for incidental finding of left chest wall calcification [stable]; approximately 4 cm fibroid in the uterus   # Clinically no evidence of recurrence. CEA pending today.  Recommend continued surveillance with CT scans in 12 months..  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 June 2017-mammogram negative. Order dx mammogram today.   # History of DVT left lower extremity- while on chemotherapy/ tamoxifen- in 2010.  currently off coumadin.  # Left kidney cyst- simple; no changes.   #patient follow-up with me in 6 months/CBC CMP CEA.  # I reviewed the blood work- with the patient in detail; also reviewed the imaging independently [as summarized above]; and with the patient in detail.   # 25 minutes face-to-face with the patient discussing the above plan of care; more than 50% of time spent on prognosis/ natural history; counseling and coordination.

## 2017-08-03 NOTE — Progress Notes (Signed)
[facility] OFFICE PROGRESS NOTE  Patient Care Team: Jinny Sanders, MD as PCP - General   SUMMARY OF ONCOLOGIC HISTORY:  Oncology History   # 2005- COLON CA [Stage II; s/p Surgery in [facility] [place]]; no Adj chemo.   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   # Nov 2010-LLE DVT [on Tam] on coumadin; AUG 2017- STOP coumadin  # ; PN-G-1; port flush q 6 w  # Left kidney cyst [Feb 2017]     Cancer of sigmoid colon ([place])     INTERVAL HISTORY:  A very pleasant 71 year old female patient with above history of metastatic colon cancer currently NED is here for follow-up/ review the results of the Surveillance imaging.   Appetite is good. No weight loss. She has not had an admission the hospital. Patient denies any blood in stools black colored stools. Denies any unusual chest pain or shortness of breath or cough. Patient denies any bleeding. No abdominal pain. No vaginal bleeding. No pain.  REVIEW OF SYSTEMS:  A complete 10 point review of system is done which is negative except mentioned above/history of present illness.   PAST MEDICAL HISTORY :  Past Medical History:  Diagnosis Date  . Allergy   . Breast CA ([place])   . Breast cancer ([place]) 2010   in situ and took radiation, no chemo needed  . Colon cancer ([place])    stage 4 colon cancer  . History of chemotherapy 02/2011-08/2011   FOLFOX/AVASTIN- 8 CYCLES  . History of DVT (deep vein thrombosis) 2010   while taking tamoxifen-left lower extremity  . History of peripheral neuropathy   . Hyperlipidemia   . Hypertension   . IDA (iron deficiency anemia)   . Osteoarthritis   . TIA (transient ischemic attack) 03/08/2011  . Vitamin D deficiency     PAST SURGICAL HISTORY :    Past Surgical History:  Procedure Laterality Date  . BREAST BIOPSY  1-10   fibroma removal, s/p lumpectomy and radiation   . BREAST LUMPECTOMY  2010   left breast  . broken ankle  Left 08/2012  . COLON SURGERY     colectomy, partial  . LOBECTOMY  06-2009   metastatic colon cancer    FAMILY HISTORY :   Family History  Problem Relation Age of Onset  . Hypertension Mother   . Stroke Mother   . Coronary artery disease Mother   . Thyroid disease Mother   . Diabetes Father   . Hypertension Father   . Lung cancer Father   . Thyroid disease Brother   . Heart attack Maternal Grandfather   . Diabetes Paternal Grandmother   . Colon cancer Paternal Grandfather   . Deep vein thrombosis Unknown   . Breast cancer Maternal Grandmother     SOCIAL HISTORY:   Social History  Substance Use Topics  . Smoking status: Never Smoker  . Smokeless tobacco: Never Used  . Alcohol use No    ALLERGIES:  is allergic to amoxicillin and erythromycin.  MEDICATIONS:  Current Outpatient Prescriptions  Medication Sig Dispense Refill  . acetaminophen (TYLENOL) 500 MG tablet as needed for moderate pain. Take 1-2 by mouth every 6 hours as needed     . aspirin 81 MG chewable  tablet Chew 81 mg by mouth daily.    . cholecalciferol (VITAMIN D) 400 UNITS TABS tablet Take 400 Units by mouth 2 (two) times daily.    . Cyanocobalamin (VITAMIN B-12) 2000 MCG TBCR Take 1 tablet by mouth daily.      . irbesartan (AVAPRO) 150 MG tablet TAKE 1 TABLET BY MOUTH EVERY DAY 90 tablet 1  . lidocaine-prilocaine (EMLA) cream Apply 1 application topically as needed. To port a cath site approximately 1 to 2 hours prior to port needle insertion. 30 g 12  . simvastatin (ZOCOR) 40 MG tablet TAKE 1 TABLET (40 MG TOTAL) BY MOUTH AT BEDTIME. 90 tablet 3   No current facility-administered medications for this visit.    Facility-Administered Medications Ordered in Other Visits  Medication Dose Route Frequency Provider Last Rate Last  Dose  . sodium chloride flush (NS) 0.9 % injection 10 mL  10 mL Intravenous PRN Cammie Sickle, MD   10 mL at 01/05/16 1135    PHYSICAL EXAMINATION: ECOG PERFORMANCE STATUS:0  BP (!) 134/91 (BP Location: Right Arm, Patient Position: Sitting)   Pulse 75   Temp 97.6 F (36.4 C) (Tympanic)   Resp 20   Ht 5\' 3"  (1.6 m)   Wt 205 lb (93 kg)   BMI 36.31 kg/m   Filed Weights   08/03/17 1440  Weight: 205 lb (93 kg)    GENERAL: Well-nourished well-developed; Alert, no distress and comfortable.  Obese; walks with a cane. Able to sit on the exam table by herself. EYES: no pallor or icterus OROPHARYNX: no thrush or ulceration; good dentition  NECK: supple, no masses felt LYMPH:  no palpable lymphadenopathy in the cervical, axillary or inguinal regions LUNGS: clear to auscultation and  No wheeze or crackles HEART/CVS: regular rate & rhythm and no murmurs; No lower extremity edema ABDOMEN:abdomen soft, non-tender and normal bowel sounds Musculoskeletal:no cyanosis of digits and no clubbing  PSYCH: alert & oriented x 3 with fluent speech NEURO: no focal motor/sensory deficits SKIN:  no rashes or significant lesions   LABORATORY DATA:  I have reviewed the data as listed    Component Value Date/Time   NA 137 08/03/2017 1425   NA 141 12/20/2013 0901   K 4.0 08/03/2017 1425   K 4.1 12/20/2013 0901   CL 105 08/03/2017 1425   CL 106 12/18/2012 0842   CO2 22 08/03/2017 1425   CO2 27 12/20/2013 0901   GLUCOSE 139 (H) 08/03/2017 1425   GLUCOSE 103 12/20/2013 0901   GLUCOSE 109 (H) 12/18/2012 0842   BUN 18 08/03/2017 1425   BUN 23.2 12/20/2013 0901   CREATININE 0.83 08/03/2017 1425   CREATININE 1.04 12/31/2014 1023   CREATININE 1.0 12/20/2013 0901   CALCIUM 9.4 08/03/2017 1425   CALCIUM 10.1 12/20/2013 0901   PROT 8.4 (H) 08/03/2017 1425   PROT 7.9 12/31/2014 1023   PROT 8.5 (H) 12/20/2013 0901   ALBUMIN 4.2 08/03/2017 1425   ALBUMIN 3.3 (L) 12/31/2014 1023   ALBUMIN 3.7  12/20/2013 0901   AST 25 08/03/2017 1425   AST 24 12/31/2014 1023   AST 21 12/20/2013 0901   ALT 21 08/03/2017 1425   ALT 28 12/31/2014 1023   ALT 18 12/20/2013 0901   ALKPHOS 131 (H) 08/03/2017 1425   ALKPHOS 135 (H) 12/31/2014 1023   ALKPHOS 144 12/20/2013 0901   BILITOT 0.7 08/03/2017 1425   BILITOT 0.4 12/31/2014 1023   BILITOT 0.60 12/20/2013 0901   GFRNONAA >60 08/03/2017 1425  GFRNONAA 56 (L) 12/31/2014 1023   GFRNONAA 57 (L) 06/24/2014 1300   GFRAA >60 08/03/2017 1425   GFRAA >60 12/31/2014 1023   GFRAA >60 06/24/2014 1300    No results found for: SPEP, UPEP  Lab Results  Component Value Date   WBC 8.7 08/03/2017   NEUTROABS 6.6 (H) 08/03/2017   HGB 15.0 08/03/2017   HCT 43.4 08/03/2017   MCV 95.1 08/03/2017   PLT 286 08/03/2017      Chemistry      Component Value Date/Time   NA 137 08/03/2017 1425   NA 141 12/20/2013 0901   K 4.0 08/03/2017 1425   K 4.1 12/20/2013 0901   CL 105 08/03/2017 1425   CL 106 12/18/2012 0842   CO2 22 08/03/2017 1425   CO2 27 12/20/2013 0901   BUN 18 08/03/2017 1425   BUN 23.2 12/20/2013 0901   CREATININE 0.83 08/03/2017 1425   CREATININE 1.04 12/31/2014 1023   CREATININE 1.0 12/20/2013 0901      Component Value Date/Time   CALCIUM 9.4 08/03/2017 1425   CALCIUM 10.1 12/20/2013 0901   ALKPHOS 131 (H) 08/03/2017 1425   ALKPHOS 135 (H) 12/31/2014 1023   ALKPHOS 144 12/20/2013 0901   AST 25 08/03/2017 1425   AST 24 12/31/2014 1023   AST 21 12/20/2013 0901   ALT 21 08/03/2017 1425   ALT 28 12/31/2014 1023   ALT 18 12/20/2013 0901   BILITOT 0.7 08/03/2017 1425   BILITOT 0.4 12/31/2014 1023   BILITOT 0.60 12/20/2013 0901       RADIOGRAPHIC STUDIES: I have personally reviewed the radiological images as listed and agreed with the findings in the report. Ct Chest W Contrast  Result Date: 08/02/2017 CLINICAL DATA:  Metastatic colorectal carcinoma with chest wall within lung recurrence. Patient status post lobectomy.  Personal history of breast cancer EXAM: CT CHEST, ABDOMEN, AND PELVIS WITH CONTRAST TECHNIQUE: Multidetector CT imaging of the chest, abdomen and pelvis was performed following the standard protocol during bolus administration of intravenous contrast. CONTRAST:  170mL ISOVUE-300 IOPAMIDOL (ISOVUE-300) INJECTION 61% COMPARISON:  CT 01/30/2016 FINDINGS: CT CHEST FINDINGS Cardiovascular: No significant vascular findings. Normal heart size. No pericardial effusion. Mediastinum/Nodes: No axillary supraclavicular adenopathy. No mediastinal hilar adenopathy. Port in the LEFT chest wall with tip in the distal SVC. Esophagus normal Lungs/Pleura: Volume loss in the LEFT hemithorax. No suspicious nodularity. Airways normal. Musculoskeletal: Along the LEFT lateral chest wall inferior to the tip the scapula there is soft tissue thickening with scattered calcifications within the muscle measuring 2.8 by 4.9 cm (image 41, series 2. This is similar appearance in size to 5.3 x 2.7 cm lesion on comparison exam. Sclerotic change within the adjacent ribs. CT ABDOMEN AND PELVIS FINDINGS Hepatobiliary: No focal hepatic lesion. No biliary ductal dilatation. Gallbladder is normal. Common bile duct is normal. Pancreas: Pancreas is normal. No ductal dilatation. No pancreatic inflammation. Spleen: Normal spleen Adrenals/urinary tract: Adrenal glands normal. Large simple cyst of the LEFT kidney. Ureters and bladder normal Stomach/Bowel: Stomach, small bowel, appendix, and cecum are normal. The colon and rectosigmoid colon are normal. Post LEFT hemicolectomy. Vascular/Lymphatic: Abdominal aorta is normal caliber with atherosclerotic calcification. There is no retroperitoneal or periportal lymphadenopathy. No pelvic lymphadenopathy. Reproductive: 4 cm leiomyoma within the uterine body. Ovaries normal. Other: No free fluid. Musculoskeletal: No metastatic disease in the pelvis or the spine IMPRESSION: Chest Impression: 1. Stable muscular  thickening along the LEFT chest wall with calcifications and sclerotic rib change. No evidence of progression.  2. No evidence of pulmonary metastasis . 3. No lymphadenopathy Abdomen / Pelvis Impression: 1. No evidence of metastatic disease in the abdomen pelvis. 2. Leiomyoma within the uterus. Electronically Signed   By: Suzy Bouchard M.D.   On: 08/02/2017 14:19   Ct Abdomen Pelvis W Contrast  Result Date: 08/02/2017 CLINICAL DATA:  Metastatic colorectal carcinoma with chest wall within lung recurrence. Patient status post lobectomy. Personal history of breast cancer EXAM: CT CHEST, ABDOMEN, AND PELVIS WITH CONTRAST TECHNIQUE: Multidetector CT imaging of the chest, abdomen and pelvis was performed following the standard protocol during bolus administration of intravenous contrast. CONTRAST:  136mL ISOVUE-300 IOPAMIDOL (ISOVUE-300) INJECTION 61% COMPARISON:  CT 01/30/2016 FINDINGS: CT CHEST FINDINGS Cardiovascular: No significant vascular findings. Normal heart size. No pericardial effusion. Mediastinum/Nodes: No axillary supraclavicular adenopathy. No mediastinal hilar adenopathy. Port in the LEFT chest wall with tip in the distal SVC. Esophagus normal Lungs/Pleura: Volume loss in the LEFT hemithorax. No suspicious nodularity. Airways normal. Musculoskeletal: Along the LEFT lateral chest wall inferior to the tip the scapula there is soft tissue thickening with scattered calcifications within the muscle measuring 2.8 by 4.9 cm (image 41, series 2. This is similar appearance in size to 5.3 x 2.7 cm lesion on comparison exam. Sclerotic change within the adjacent ribs. CT ABDOMEN AND PELVIS FINDINGS Hepatobiliary: No focal hepatic lesion. No biliary ductal dilatation. Gallbladder is normal. Common bile duct is normal. Pancreas: Pancreas is normal. No ductal dilatation. No pancreatic inflammation. Spleen: Normal spleen Adrenals/urinary tract: Adrenal glands normal. Large simple cyst of the LEFT kidney. Ureters and  bladder normal Stomach/Bowel: Stomach, small bowel, appendix, and cecum are normal. The colon and rectosigmoid colon are normal. Post LEFT hemicolectomy. Vascular/Lymphatic: Abdominal aorta is normal caliber with atherosclerotic calcification. There is no retroperitoneal or periportal lymphadenopathy. No pelvic lymphadenopathy. Reproductive: 4 cm leiomyoma within the uterine body. Ovaries normal. Other: No free fluid. Musculoskeletal: No metastatic disease in the pelvis or the spine IMPRESSION: Chest Impression: 1. Stable muscular thickening along the LEFT chest wall with calcifications and sclerotic rib change. No evidence of progression. 2. No evidence of pulmonary metastasis . 3. No lymphadenopathy Abdomen / Pelvis Impression: 1. No evidence of metastatic disease in the abdomen pelvis. 2. Leiomyoma within the uterus. Electronically Signed   By: Suzy Bouchard M.D.   On: 08/02/2017 14:19    IMPRESSION: Chest Impression:  1. Stable muscular thickening along the LEFT chest wall with calcifications and sclerotic rib change. No evidence of progression. 2. No evidence of pulmonary metastasis . 3. No lymphadenopathy  Abdomen / Pelvis Impression:  1. No evidence of metastatic disease in the abdomen pelvis. 2. Leiomyoma within the uterus.   Electronically Signed   By: Suzy Bouchard M.D.   On: 08/02/2017 14:19 ASSESSMENT & PLAN:   Cancer of sigmoid colon ([place]) # Metastatic colon cancer chest wall lung recurrence/ status post lobectomy- 2010. AUG 2018- CT of the chest and pelvis-NED- except for incidental finding of left chest wall calcification [stable]; approximately 4 cm fibroid in the uterus   # Clinically no evidence of recurrence. CEA pending today.  Recommend continued surveillance with CT scans in 12 months..  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 June 2017-mammogram negative. Order dx mammogram today.   # History of DVT left lower extremity- while on  chemotherapy/ tamoxifen- in 2010.  currently off coumadin.  # Left kidney cyst- simple; no changes.   #patient follow-up with me in 6 months/CBC CMP CEA.  # I reviewed  the blood work- with the patient in detail; also reviewed the imaging independently [as summarized above]; and with the patient in detail.   # 25 minutes face-to-face with the patient discussing the above plan of care; more than 50% of time spent on prognosis/ natural history; counseling and coordination.        Cammie Sickle, MD 08/03/2017 5:25 PM

## 2017-08-04 LAB — CEA: CEA: 1.8 ng/mL (ref 0.0–4.7)

## 2017-08-12 ENCOUNTER — Inpatient Hospital Stay: Payer: Medicare Other | Attending: Internal Medicine

## 2017-08-12 DIAGNOSIS — Z452 Encounter for adjustment and management of vascular access device: Secondary | ICD-10-CM | POA: Insufficient documentation

## 2017-08-12 DIAGNOSIS — Z85118 Personal history of other malignant neoplasm of bronchus and lung: Secondary | ICD-10-CM | POA: Diagnosis not present

## 2017-08-12 DIAGNOSIS — Z902 Acquired absence of lung [part of]: Secondary | ICD-10-CM | POA: Insufficient documentation

## 2017-08-12 DIAGNOSIS — Z923 Personal history of irradiation: Secondary | ICD-10-CM | POA: Insufficient documentation

## 2017-08-12 DIAGNOSIS — Z9223 Personal history of estrogen therapy: Secondary | ICD-10-CM | POA: Diagnosis not present

## 2017-08-12 DIAGNOSIS — Z86000 Personal history of in-situ neoplasm of breast: Secondary | ICD-10-CM | POA: Insufficient documentation

## 2017-08-12 DIAGNOSIS — Z9221 Personal history of antineoplastic chemotherapy: Secondary | ICD-10-CM | POA: Insufficient documentation

## 2017-08-12 DIAGNOSIS — Z85038 Personal history of other malignant neoplasm of large intestine: Secondary | ICD-10-CM | POA: Diagnosis not present

## 2017-08-12 DIAGNOSIS — C801 Malignant (primary) neoplasm, unspecified: Secondary | ICD-10-CM

## 2017-08-12 MED ORDER — SODIUM CHLORIDE 0.9% FLUSH
10.0000 mL | INTRAVENOUS | Status: DC | PRN
  Administered 2017-08-12: 10 mL via INTRAVENOUS
  Filled 2017-08-12: qty 10

## 2017-08-12 MED ORDER — HEPARIN SOD (PORK) LOCK FLUSH 100 UNIT/ML IV SOLN
500.0000 [IU] | Freq: Once | INTRAVENOUS | Status: AC
  Administered 2017-08-12: 500 [IU] via INTRAVENOUS

## 2017-09-02 ENCOUNTER — Ambulatory Visit
Admission: RE | Admit: 2017-09-02 | Discharge: 2017-09-02 | Disposition: A | Discharge status: Home / Self Care | Payer: Medicare Other | Source: Ambulatory Visit | Attending: Internal Medicine | Admitting: Internal Medicine

## 2017-09-02 DIAGNOSIS — Z853 Personal history of malignant neoplasm of breast: Secondary | ICD-10-CM

## 2017-09-02 DIAGNOSIS — R928 Other abnormal and inconclusive findings on diagnostic imaging of breast: Secondary | ICD-10-CM | POA: Diagnosis not present

## 2017-09-03 ENCOUNTER — Other Ambulatory Visit: Payer: Self-pay | Admitting: Family Medicine

## 2017-09-06 DIAGNOSIS — Z23 Encounter for immunization: Secondary | ICD-10-CM | POA: Diagnosis not present

## 2017-10-07 ENCOUNTER — Inpatient Hospital Stay: Payer: Medicare Other | Attending: Internal Medicine

## 2017-10-07 DIAGNOSIS — Z85038 Personal history of other malignant neoplasm of large intestine: Secondary | ICD-10-CM | POA: Insufficient documentation

## 2017-10-07 DIAGNOSIS — Z9223 Personal history of estrogen therapy: Secondary | ICD-10-CM | POA: Insufficient documentation

## 2017-10-07 DIAGNOSIS — Z86000 Personal history of in-situ neoplasm of breast: Secondary | ICD-10-CM | POA: Insufficient documentation

## 2017-10-07 DIAGNOSIS — Z85118 Personal history of other malignant neoplasm of bronchus and lung: Secondary | ICD-10-CM | POA: Insufficient documentation

## 2017-10-07 DIAGNOSIS — Z902 Acquired absence of lung [part of]: Secondary | ICD-10-CM | POA: Insufficient documentation

## 2017-10-07 DIAGNOSIS — Z923 Personal history of irradiation: Secondary | ICD-10-CM | POA: Insufficient documentation

## 2017-10-07 DIAGNOSIS — Z9221 Personal history of antineoplastic chemotherapy: Secondary | ICD-10-CM | POA: Insufficient documentation

## 2017-10-07 DIAGNOSIS — Z452 Encounter for adjustment and management of vascular access device: Secondary | ICD-10-CM | POA: Insufficient documentation

## 2017-10-14 ENCOUNTER — Inpatient Hospital Stay: Payer: Medicare Other

## 2017-10-14 DIAGNOSIS — Z9223 Personal history of estrogen therapy: Secondary | ICD-10-CM | POA: Diagnosis not present

## 2017-10-14 DIAGNOSIS — Z95828 Presence of other vascular implants and grafts: Secondary | ICD-10-CM

## 2017-10-14 DIAGNOSIS — Z85038 Personal history of other malignant neoplasm of large intestine: Secondary | ICD-10-CM | POA: Diagnosis not present

## 2017-10-14 DIAGNOSIS — Z86000 Personal history of in-situ neoplasm of breast: Secondary | ICD-10-CM | POA: Diagnosis not present

## 2017-10-14 DIAGNOSIS — Z923 Personal history of irradiation: Secondary | ICD-10-CM | POA: Diagnosis not present

## 2017-10-14 DIAGNOSIS — Z85118 Personal history of other malignant neoplasm of bronchus and lung: Secondary | ICD-10-CM | POA: Diagnosis not present

## 2017-10-14 DIAGNOSIS — Z9221 Personal history of antineoplastic chemotherapy: Secondary | ICD-10-CM | POA: Diagnosis not present

## 2017-10-14 DIAGNOSIS — Z452 Encounter for adjustment and management of vascular access device: Secondary | ICD-10-CM | POA: Diagnosis not present

## 2017-10-14 DIAGNOSIS — Z902 Acquired absence of lung [part of]: Secondary | ICD-10-CM | POA: Diagnosis not present

## 2017-10-14 MED ORDER — HEPARIN SOD (PORK) LOCK FLUSH 100 UNIT/ML IV SOLN
500.0000 [IU] | INTRAVENOUS | Status: AC | PRN
  Administered 2017-10-14: 500 [IU]

## 2017-10-14 MED ORDER — SODIUM CHLORIDE 0.9% FLUSH
10.0000 mL | INTRAVENOUS | Status: AC | PRN
  Administered 2017-10-14: 10 mL
  Filled 2017-10-14: qty 10

## 2017-11-03 ENCOUNTER — Telehealth: Payer: Self-pay | Admitting: Family Medicine

## 2017-11-03 DIAGNOSIS — E119 Type 2 diabetes mellitus without complications: Secondary | ICD-10-CM

## 2017-11-03 DIAGNOSIS — M8589 Other specified disorders of bone density and structure, multiple sites: Secondary | ICD-10-CM

## 2017-11-03 DIAGNOSIS — E7800 Pure hypercholesterolemia, unspecified: Secondary | ICD-10-CM

## 2017-11-03 DIAGNOSIS — D509 Iron deficiency anemia, unspecified: Secondary | ICD-10-CM

## 2017-11-03 NOTE — Telephone Encounter (Signed)
-----   Message from Inocencio Homes, [place] sent at 10/24/2017  1:45 PM EST ----- Regarding: LABS  Patient is scheduled for labs on 11/04/17, can you please order labs. Thank you.

## 2017-11-04 ENCOUNTER — Other Ambulatory Visit (INDEPENDENT_AMBULATORY_CARE_PROVIDER_SITE_OTHER): Payer: Medicare Other

## 2017-11-04 DIAGNOSIS — E119 Type 2 diabetes mellitus without complications: Secondary | ICD-10-CM | POA: Diagnosis not present

## 2017-11-04 DIAGNOSIS — M8589 Other specified disorders of bone density and structure, multiple sites: Secondary | ICD-10-CM

## 2017-11-04 DIAGNOSIS — D509 Iron deficiency anemia, unspecified: Secondary | ICD-10-CM | POA: Diagnosis not present

## 2017-11-04 DIAGNOSIS — E7800 Pure hypercholesterolemia, unspecified: Secondary | ICD-10-CM | POA: Diagnosis not present

## 2017-11-04 LAB — CBC WITH DIFFERENTIAL/PLATELET
BASOS ABS: 0 10*3/uL (ref 0.0–0.1)
BASOS PCT: 0.5 % (ref 0.0–3.0)
EOS ABS: 0.2 10*3/uL (ref 0.0–0.7)
Eosinophils Relative: 3 % (ref 0.0–5.0)
HEMATOCRIT: 44.5 % (ref 36.0–46.0)
Hemoglobin: 14.8 g/dL (ref 12.0–15.0)
LYMPHS ABS: 1.2 10*3/uL (ref 0.7–4.0)
LYMPHS PCT: 18 % (ref 12.0–46.0)
MCHC: 33.2 g/dL (ref 30.0–36.0)
MCV: 98.7 fl (ref 78.0–100.0)
Monocytes Absolute: 0.6 10*3/uL (ref 0.1–1.0)
Monocytes Relative: 8.3 % (ref 3.0–12.0)
NEUTROS ABS: 4.7 10*3/uL (ref 1.4–7.7)
NEUTROS PCT: 70.2 % (ref 43.0–77.0)
PLATELETS: 282 10*3/uL (ref 150.0–400.0)
RBC: 4.51 Mil/uL (ref 3.87–5.11)
RDW: 13 % (ref 11.5–15.5)
WBC: 6.8 10*3/uL (ref 4.0–10.5)

## 2017-11-04 LAB — COMPREHENSIVE METABOLIC PANEL
ALT: 14 U/L (ref 0–35)
AST: 17 U/L (ref 0–37)
Albumin: 3.9 g/dL (ref 3.5–5.2)
Alkaline Phosphatase: 122 U/L — ABNORMAL HIGH (ref 39–117)
BILIRUBIN TOTAL: 0.5 mg/dL (ref 0.2–1.2)
BUN: 21 mg/dL (ref 6–23)
CALCIUM: 9.4 mg/dL (ref 8.4–10.5)
CHLORIDE: 106 meq/L (ref 96–112)
CO2: 28 meq/L (ref 19–32)
CREATININE: 0.78 mg/dL (ref 0.40–1.20)
GFR: 76.83 mL/min (ref >60.00)
GLUCOSE: 107 mg/dL — AB (ref 70–99)
Potassium: 4.4 mEq/L (ref 3.5–5.1)
SODIUM: 139 meq/L (ref 135–145)
Total Protein: 7.9 g/dL (ref 6.0–8.3)

## 2017-11-04 LAB — LIPID PANEL
CHOL/HDL RATIO: 3
Cholesterol: 128 mg/dL (ref 0–200)
HDL: 39.4 mg/dL (ref >39.00)
LDL Cholesterol: 68 mg/dL (ref 0–99)
NonHDL: 88.53
Triglycerides: 102 mg/dL (ref 0.0–149.0)
VLDL: 20.4 mg/dL (ref 0.0–40.0)

## 2017-11-04 LAB — VITAMIN D 25 HYDROXY (VIT D DEFICIENCY, FRACTURES): VITD: 36.66 ng/mL (ref 30.00–100.00)

## 2017-11-04 LAB — HEMOGLOBIN A1C: HEMOGLOBIN A1C: 5.7 % (ref 4.6–6.5)

## 2017-11-08 ENCOUNTER — Encounter: Payer: Self-pay | Admitting: Family Medicine

## 2017-11-08 ENCOUNTER — Ambulatory Visit (INDEPENDENT_AMBULATORY_CARE_PROVIDER_SITE_OTHER): Payer: Medicare Other | Admitting: Family Medicine

## 2017-11-08 VITALS — BP 148/78 | HR 67 | Temp 98.1°F | Ht 62.75 in | Wt 204.8 lb

## 2017-11-08 DIAGNOSIS — E559 Vitamin D deficiency, unspecified: Secondary | ICD-10-CM | POA: Diagnosis not present

## 2017-11-08 DIAGNOSIS — I10 Essential (primary) hypertension: Secondary | ICD-10-CM

## 2017-11-08 DIAGNOSIS — E119 Type 2 diabetes mellitus without complications: Secondary | ICD-10-CM | POA: Diagnosis not present

## 2017-11-08 DIAGNOSIS — E7800 Pure hypercholesterolemia, unspecified: Secondary | ICD-10-CM

## 2017-11-08 DIAGNOSIS — Z0000 Encounter for general adult medical examination without abnormal findings: Secondary | ICD-10-CM

## 2017-11-08 DIAGNOSIS — D509 Iron deficiency anemia, unspecified: Secondary | ICD-10-CM

## 2017-11-08 NOTE — Assessment & Plan Note (Signed)
Well controlled on no med. 

## 2017-11-08 NOTE — Patient Instructions (Addendum)
Call Dr. Lorie Apley office to determine when next colonoscopy due.  Work on healthy eating and walk as much as tolerated.

## 2017-11-08 NOTE — Progress Notes (Signed)
Subjective:    Patient ID: Susan Duffy, female    DOB: Feb 08, 1946, 71 y.o.   MRN: [id number]  HPI The patient presents for annual medicare wellness, complete physical and review of chronic health problems.   .I have personally reviewed the Medicare Annual Wellness questionnaire and have noted 1. The patient's medical and social history 2. Their use of alcohol, tobacco or illicit drugs 3. Their current medications and supplements 4. The patient's functional ability including ADL's, fall risks, home safety risks and hearing or visual             impairment. 5. Diet and physical activities 6. Evidence for depression or mood disorders 7.         Updated provider list Cognitive evaluation was performed and recorded on pt medicare questionnaire form. The patients weight, height, BMI and visual acuity have been recorded in the chart  I have made referrals, counseling and provided education to the patient based review of the above and I have provided the pt with a written personalized care plan for preventive services.   Documentation of this information was scanned into the electronic record under the media tab.   Vit d def: normalized with OTC supplement  Diabetes:   Well controlled on no med Lab Results  Component Value Date   HGBA1C 5.7 11/04/2017  Using medications without difficulties: Hypoglycemic episodes: none Hyperglycemic episodes: none Feet problems: none Blood Sugars averaging: not checking eye exam within last year:  Pattyvision last OV 04/2017  Elevated Cholesterol:  LDL at goal < 70 with CVA history on simvastatin Lab Results  Component Value Date   CHOL 128 11/04/2017   HDL 39.40 11/04/2017   LDLCALC 68 11/04/2017   LDLDIRECT 83.0 10/16/2015   TRIG 102.0 11/04/2017   CHOLHDL 3 11/04/2017  Using medications without problems: none Muscle aches:  none Diet compliance: good Exercise: minimal.. Using cane Other complaints:  Hypertension:    Above goal  today.. She has white coat HTN BP Readings from Last 3 Encounters:  11/08/17 (!) 148/78  08/03/17 (!) 134/91  05/03/17 110/70  Using medication without problems or lightheadedness:  none Chest pain with exertion: none Edema: stable Short of breath:none at rest.. Some increased with vacuuming Average home BPs: At home BPs running  At goal 125/80 Other issues:  Patient Care Team: Jinny Sanders, MD as PCP - General   Dr. Page Spiro,  following CEA  Dr. Collene Mares: GI  Fall Risk  11/08/2017 11/01/2016 10/24/2015 10/22/2014 10/19/2013  Falls in the past year? No Yes No No No  Number falls in past yr: - 1 - - -  Injury with Fall? - No - - -  Risk for fall due to : - - - - Impaired mobility;History of fall(s)  Risk for fall due to: Comment - - - - Uses Cane    Advance directives and end of life planning reviewed in detail with patient and documented in EMR. Patient given handout on advance care directives if needed. HCPOA and living will updated if needed.  Depression screen PHQ 2/9 11/08/2017  Decreased Interest 0  Down, Depressed, Hopeless 0  PHQ - 2 Score 0  Some recent data might be hidden    Social History /Family History/Past Medical History reviewed in detail and updated in EMR if needed. Blood pressure (!) 148/78, pulse 67, temperature 98.1 F (36.7 C), temperature source Oral, height 5' 2.75" (1.594 m), weight 204 lb 12 oz (92.9 kg), SpO2 96 %.  Review  of Systems  Constitutional: Negative for fatigue and fever.  HENT: Negative for congestion.   Eyes: Negative for pain.  Respiratory: Negative for cough and shortness of breath.   Cardiovascular: Negative for chest pain, palpitations and leg swelling.  Gastrointestinal: Negative for abdominal pain.  Genitourinary: Negative for dysuria and vaginal bleeding.  Musculoskeletal: Negative for back pain.  Neurological: Negative for syncope, light-headedness and headaches.  Psychiatric/Behavioral: Negative for dysphoric mood.         Objective:   Physical Exam  Constitutional: Vital signs are normal. She appears well-developed and well-nourished. She is cooperative.  Non-toxic appearance. She does not appear ill. No distress.  HENT:  Head: Normocephalic.  Right Ear: Hearing, tympanic membrane, external ear and ear canal normal. Tympanic membrane is not erythematous, not retracted and not bulging.  Left Ear: Hearing, tympanic membrane, external ear and ear canal normal. Tympanic membrane is not erythematous, not retracted and not bulging.  Nose: No mucosal edema or rhinorrhea. Right sinus exhibits no maxillary sinus tenderness and no frontal sinus tenderness. Left sinus exhibits no maxillary sinus tenderness and no frontal sinus tenderness.  Mouth/Throat: Uvula is midline, oropharynx is clear and moist and mucous membranes are normal.  Eyes: Conjunctivae, EOM and lids are normal. Pupils are equal, round, and reactive to light. Lids are everted and swept, no foreign bodies found.  Neck: Trachea normal and normal range of motion. Neck supple. Carotid bruit is not present. No thyroid mass and no thyromegaly present.  Cardiovascular: Normal rate, regular rhythm, S1 normal, S2 normal, normal heart sounds, intact distal pulses and normal pulses. Exam reveals no gallop and no friction rub.  No murmur heard. Pulmonary/Chest: Effort normal and breath sounds normal. No tachypnea. No respiratory distress. She has no decreased breath sounds. She has no wheezes. She has no rhonchi. She has no rales.  Abdominal: Soft. Normal appearance and bowel sounds are normal. There is no tenderness.  Neurological: She is alert.  Skin: Skin is warm, dry and intact. No rash noted.  Left ankle swelling and chronic changes  varicosities  Psychiatric: Her speech is normal and behavior is normal. Judgment and thought content normal. Her mood appears not anxious. Cognition and memory are normal. She does not exhibit a depressed mood.     Diabetic foot  exam: Normal inspection No skin breakdown No calluses  Normal DP pulses Normal sensation to light touch and monofilament Nails normal     Assessment & Plan:  The patient's preventative maintenance and recommended screening tests for an annual wellness exam were reviewed in full today. Brought up to date unless services declined.  Counselled on the importance of diet, exercise, and its role in overall health and mortality. The patient's FH and SH was reviewed, including their home life, tobacco status, and drug and alcohol status.   Vaccines: Uptodate except, considering shingles.  Mammo: Hx of breast cancer. Stable 08/2017 Bone density:02/2013 osteopenia, repeat in 5 years Colon: history of stage 4 colon cancer... Per GI Dr. Collene Mares, colonoscopy 02/2013 Negative.. Repeat in 3-5 years.Marland Kitchen PAP/DVE: DVE, pap not indicated, no symptoms.  Hep C completed Nonsmoker.  Reviewed advanced directives.

## 2017-11-08 NOTE — Assessment & Plan Note (Signed)
Well controlled. Continue current medication. Encouraged exercise, weight loss, healthy eating habits.  

## 2017-11-08 NOTE — Assessment & Plan Note (Signed)
Resolved with daily vit D

## 2017-11-08 NOTE — Assessment & Plan Note (Signed)
Counseled on weight loss and  Healthy lifestlye.

## 2017-12-01 ENCOUNTER — Other Ambulatory Visit: Payer: Self-pay | Admitting: Family Medicine

## 2017-12-02 ENCOUNTER — Inpatient Hospital Stay: Payer: Medicare Other | Attending: Internal Medicine

## 2017-12-02 DIAGNOSIS — Z9223 Personal history of estrogen therapy: Secondary | ICD-10-CM | POA: Insufficient documentation

## 2017-12-02 DIAGNOSIS — Z902 Acquired absence of lung [part of]: Secondary | ICD-10-CM | POA: Diagnosis not present

## 2017-12-02 DIAGNOSIS — Z95828 Presence of other vascular implants and grafts: Secondary | ICD-10-CM

## 2017-12-02 DIAGNOSIS — Z923 Personal history of irradiation: Secondary | ICD-10-CM | POA: Insufficient documentation

## 2017-12-02 DIAGNOSIS — Z86000 Personal history of in-situ neoplasm of breast: Secondary | ICD-10-CM | POA: Insufficient documentation

## 2017-12-02 DIAGNOSIS — Z85038 Personal history of other malignant neoplasm of large intestine: Secondary | ICD-10-CM | POA: Insufficient documentation

## 2017-12-02 DIAGNOSIS — Z9221 Personal history of antineoplastic chemotherapy: Secondary | ICD-10-CM | POA: Insufficient documentation

## 2017-12-02 DIAGNOSIS — Z452 Encounter for adjustment and management of vascular access device: Secondary | ICD-10-CM | POA: Insufficient documentation

## 2017-12-02 DIAGNOSIS — Z85118 Personal history of other malignant neoplasm of bronchus and lung: Secondary | ICD-10-CM | POA: Diagnosis not present

## 2017-12-02 MED ORDER — SODIUM CHLORIDE 0.9% FLUSH
10.0000 mL | INTRAVENOUS | Status: AC | PRN
  Administered 2017-12-02: 10 mL
  Filled 2017-12-02: qty 10

## 2017-12-02 MED ORDER — HEPARIN SOD (PORK) LOCK FLUSH 100 UNIT/ML IV SOLN
500.0000 [IU] | INTRAVENOUS | Status: AC | PRN
  Administered 2017-12-02: 500 [IU]

## 2017-12-15 ENCOUNTER — Ambulatory Visit: Payer: Self-pay | Admitting: General Practice

## 2018-01-31 ENCOUNTER — Other Ambulatory Visit: Payer: Self-pay | Admitting: *Deleted

## 2018-01-31 DIAGNOSIS — C187 Malignant neoplasm of sigmoid colon: Secondary | ICD-10-CM

## 2018-02-01 ENCOUNTER — Other Ambulatory Visit: Payer: Self-pay

## 2018-02-01 ENCOUNTER — Inpatient Hospital Stay (HOSPITAL_BASED_OUTPATIENT_CLINIC_OR_DEPARTMENT_OTHER): Payer: Medicare Other | Admitting: Internal Medicine

## 2018-02-01 ENCOUNTER — Encounter: Payer: Self-pay | Admitting: Internal Medicine

## 2018-02-01 ENCOUNTER — Inpatient Hospital Stay: Payer: Medicare Other | Attending: Internal Medicine

## 2018-02-01 VITALS — BP 164/100 | HR 71 | Temp 97.9°F | Resp 20 | Ht 62.75 in | Wt 203.0 lb

## 2018-02-01 DIAGNOSIS — Z86000 Personal history of in-situ neoplasm of breast: Secondary | ICD-10-CM | POA: Insufficient documentation

## 2018-02-01 DIAGNOSIS — D259 Leiomyoma of uterus, unspecified: Secondary | ICD-10-CM | POA: Diagnosis not present

## 2018-02-01 DIAGNOSIS — Z85118 Personal history of other malignant neoplasm of bronchus and lung: Secondary | ICD-10-CM | POA: Insufficient documentation

## 2018-02-01 DIAGNOSIS — I10 Essential (primary) hypertension: Secondary | ICD-10-CM | POA: Diagnosis not present

## 2018-02-01 DIAGNOSIS — C187 Malignant neoplasm of sigmoid colon: Secondary | ICD-10-CM

## 2018-02-01 DIAGNOSIS — Z95828 Presence of other vascular implants and grafts: Secondary | ICD-10-CM

## 2018-02-01 DIAGNOSIS — Z86718 Personal history of other venous thrombosis and embolism: Secondary | ICD-10-CM

## 2018-02-01 DIAGNOSIS — N281 Cyst of kidney, acquired: Secondary | ICD-10-CM

## 2018-02-01 DIAGNOSIS — Z85038 Personal history of other malignant neoplasm of large intestine: Secondary | ICD-10-CM

## 2018-02-01 LAB — CBC WITH DIFFERENTIAL/PLATELET
BASOS ABS: 0.1 10*3/uL (ref 0–0.1)
BASOS PCT: 1 %
EOS ABS: 0.1 10*3/uL (ref 0–0.7)
EOS PCT: 2 %
HCT: 43.5 % (ref 35.0–47.0)
HEMOGLOBIN: 15.1 g/dL (ref 12.0–16.0)
LYMPHS ABS: 1.3 10*3/uL (ref 1.0–3.6)
Lymphocytes Relative: 14 %
MCH: 33.5 pg (ref 26.0–34.0)
MCHC: 34.8 g/dL (ref 32.0–36.0)
MCV: 96.2 fL (ref 80.0–100.0)
Monocytes Absolute: 0.8 10*3/uL (ref 0.2–0.9)
Monocytes Relative: 10 %
NEUTROS PCT: 73 %
Neutro Abs: 6.6 10*3/uL — ABNORMAL HIGH (ref 1.4–6.5)
PLATELETS: 286 10*3/uL (ref 150–440)
RBC: 4.52 MIL/uL (ref 3.80–5.20)
RDW: 12.5 % (ref 11.5–14.5)
WBC: 8.9 10*3/uL (ref 3.6–11.0)

## 2018-02-01 LAB — COMPREHENSIVE METABOLIC PANEL
ALBUMIN: 3.9 g/dL (ref 3.5–5.0)
ALK PHOS: 121 U/L (ref 38–126)
ALT: 19 U/L (ref 14–54)
AST: 22 U/L (ref 15–41)
Anion gap: 7 (ref 5–15)
BUN: 23 mg/dL — AB (ref 6–20)
CALCIUM: 9.3 mg/dL (ref 8.9–10.3)
CHLORIDE: 106 mmol/L (ref 101–111)
CO2: 24 mmol/L (ref 22–32)
CREATININE: 0.79 mg/dL (ref 0.44–1.00)
GFR calc Af Amer: >60 mL/min (ref >60)
GFR calc non Af Amer: >60 mL/min (ref >60)
GLUCOSE: 102 mg/dL — AB (ref 65–99)
Potassium: 4.2 mmol/L (ref 3.5–5.1)
SODIUM: 137 mmol/L (ref 135–145)
Total Bilirubin: 0.7 mg/dL (ref 0.3–1.2)
Total Protein: 8.4 g/dL — ABNORMAL HIGH (ref 6.5–8.1)

## 2018-02-01 MED ORDER — SODIUM CHLORIDE 0.9% FLUSH
10.0000 mL | INTRAVENOUS | Status: DC | PRN
  Administered 2018-02-01: 10 mL via INTRAVENOUS
  Filled 2018-02-01: qty 10

## 2018-02-01 MED ORDER — HEPARIN SOD (PORK) LOCK FLUSH 100 UNIT/ML IV SOLN
500.0000 [IU] | Freq: Once | INTRAVENOUS | Status: AC
  Administered 2018-02-01: 500 [IU] via INTRAVENOUS

## 2018-02-01 NOTE — Progress Notes (Signed)
Patient here for follow-up on colon cancer.

## 2018-02-01 NOTE — Assessment & Plan Note (Addendum)
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 AUG 2018- CT of the chest and pelvis-NED- except for incidental finding of left chest wall calcification [stable]; approximately 4 cm fibroid in the uterus.    # Clinically no evidence of recurrence. CEA pending today.  Recommend continued surveillance with CT scans in 6 month; colonoscopy pending with Dr.Mann [[place]]  # DCIS status post lumpectomy- status post Aromasin.  Sep 2018- Mammogram negative.   # elevated Blood pressure- 160s/100s; but home controlled.continue checking at home.   # continue port flush every 2 months.   # patient follow-up with me in 6 months/CBC CMP CEA; CT scan prior.    # 25 minutes face-to-face with the patient discussing the above plan of care; more than 50% of time spent on prognosis/ natural history; counseling and coordination.

## 2018-02-01 NOTE — Progress Notes (Signed)
[place] OFFICE PROGRESS NOTE  Patient Care Team: Jinny Sanders, MD as PCP - General   SUMMARY OF ONCOLOGIC HISTORY:  Oncology History   # 2005- COLON CA [Stage II- left sided; s/p Surgery in [facility] [place]]; no Adj chemo.   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   # Nov 2010-LLE DVT [on Tam] on coumadin; AUG 2017- STOP coumadin  # ; PN-G-1; port flush q 6 w  # Left kidney cyst [Feb 2017]     Cancer of sigmoid colon ([place])     INTERVAL HISTORY:  A very pleasant 72 year old female patient with above history of metastatic colon cancer currently NED-currently on surveillance is here for follow-up.  Patient denies any blood in stools black colored stools.  Denies any new lumps or bumps.  Denies any shortness of breath or cough.  She is awaiting to schedule a colonoscopy with her GI doctor and [place].  No abdominal pain.  No nausea no vomiting.  No weight loss.  REVIEW OF SYSTEMS:  A complete 10 point review of system is done which is negative except mentioned above/history of present illness.   PAST MEDICAL HISTORY :  Past Medical History:  Diagnosis Date  . Allergy   . Breast CA ([place])   . Breast cancer ([place]) 2010   in situ and took radiation, no chemo needed  . Colon cancer ([place])    stage 4 colon cancer  . History of chemotherapy 02/2011-08/2011   FOLFOX/AVASTIN- 8 CYCLES  . History of DVT (deep vein thrombosis) 2010   while taking tamoxifen-left lower extremity  . History of peripheral neuropathy   . Hyperlipidemia   . Hypertension   . IDA (iron deficiency anemia)   . Osteoarthritis   . TIA (transient ischemic attack) 03/08/2011  . Vitamin D deficiency     PAST SURGICAL HISTORY :   Past Surgical  History:  Procedure Laterality Date  . BREAST EXCISIONAL BIOPSY Left 1-10   s/p lumpectomy and radiation left dcis  . BREAST EXCISIONAL BIOPSY Left    fibroadeoma reoved age 55  . BREAST LUMPECTOMY  2010   left breast  . broken ankle  Left 08/2012  . COLON SURGERY     colectomy, partial  . LOBECTOMY  06-2009   metastatic colon cancer    FAMILY HISTORY :  No family hx of colon cancer; grandmother-mat- breast cancer in 39s; otherwise no cancers in family.  Family History  Problem Relation Age of Onset  . Hypertension Mother   . Stroke Mother   . Coronary artery disease Mother   . Thyroid disease Mother   . Diabetes Father   . Hypertension Father   . Lung cancer Father   . Heart attack Maternal Grandfather   . Breast cancer Maternal Grandmother   . Thyroid disease Brother   . Diabetes Paternal Grandmother   . Colon cancer Paternal Grandfather   . Deep vein thrombosis Unknown     SOCIAL HISTORY:   Social History   Tobacco Use  . Smoking status: Never Smoker  . Smokeless tobacco: Never Used  Substance Use Topics  . Alcohol use: No  . Drug use: No    ALLERGIES:  is allergic to amoxicillin; erythromycin; and nitrofuran derivatives.  MEDICATIONS:  Current Outpatient Medications  Medication Sig Dispense Refill  . acetaminophen (TYLENOL) 500 MG tablet as needed for moderate pain. Take 1-2 by mouth every 6 hours as needed     . aspirin 81 MG chewable tablet Chew 81 mg by mouth daily.    . cholecalciferol (VITAMIN D) 400 UNITS TABS tablet Take 400 Units by mouth 2 (two) times daily.    . Cyanocobalamin (VITAMIN B-12) 2000 MCG TBCR Take 1 tablet by mouth daily.      . irbesartan (AVAPRO) 150 MG tablet TAKE 1 TABLET BY MOUTH EVERY DAY 90 tablet 3  . lidocaine-prilocaine (EMLA) cream Apply 1 application topically as needed. To port a cath site approximately 1 to 2 hours prior to port needle insertion. 30 g 12  . simvastatin (ZOCOR) 40 MG tablet TAKE 1 TABLET (40 MG TOTAL) BY  MOUTH AT BEDTIME. 90 tablet 3   No current facility-administered medications for this visit.    Facility-Administered Medications Ordered in Other Visits  Medication Dose Route Frequency Provider Last Rate Last Dose  . sodium chloride flush (NS) 0.9 % injection 10 mL  10 mL Intravenous PRN Cammie Sickle, MD   10 mL at 01/05/16 1135  . sodium chloride flush (NS) 0.9 % injection 10 mL  10 mL Intravenous PRN Lloyd Huger, MD   10 mL at 02/01/18 1315    PHYSICAL EXAMINATION: ECOG PERFORMANCE STATUS:0  BP (!) 164/100   Pulse 71   Temp 97.9 F (36.6 C) (Tympanic)   Resp 20   Ht 5' 2.75" (1.594 m)   Wt 203 lb (92.1 kg)   BMI 36.25 kg/m   Filed Weights   02/01/18 1321  Weight: 203 lb (92.1 kg)    GENERAL: Well-nourished well-developed; Alert, no distress and comfortable.  Obese; walks with a cane. Able to sit on the exam table by herself. EYES: no pallor or icterus OROPHARYNX: no thrush or ulceration; good dentition  NECK: supple, no masses felt LYMPH:  no palpable lymphadenopathy in the cervical, axillary or inguinal regions LUNGS: clear to auscultation and  No wheeze or crackles HEART/CVS: regular rate & rhythm and no murmurs; No lower extremity edema ABDOMEN:abdomen soft, non-tender and normal bowel sounds Musculoskeletal:no cyanosis of digits and no clubbing  PSYCH: alert & oriented x 3 with fluent speech NEURO: no focal motor/sensory deficits SKIN:  no rashes or significant lesions   LABORATORY DATA:  I have reviewed the data as listed    Component Value Date/Time   NA 139 11/04/2017 0841   NA 141 12/20/2013 0901   K 4.4 11/04/2017 0841   K 4.1 12/20/2013 0901   CL 106 11/04/2017 0841   CL 106 12/18/2012 0842   CO2 28 11/04/2017 0841   CO2 27 12/20/2013 0901   GLUCOSE 107 (H) 11/04/2017 0841   GLUCOSE 103 12/20/2013 0901   GLUCOSE 109 (H) 12/18/2012 0842   BUN 21 11/04/2017 0841   BUN 23.2 12/20/2013 0901   CREATININE 0.78 11/04/2017 0841    CREATININE 1.04 12/31/2014 1023   CREATININE 1.0 12/20/2013 0901   CALCIUM 9.4 11/04/2017 0841   CALCIUM 10.1 12/20/2013 0901   PROT 7.9 11/04/2017 0841   PROT 7.9 12/31/2014 1023   PROT 8.5 (H) 12/20/2013 0901   ALBUMIN 3.9 11/04/2017 0841   ALBUMIN 3.3 (L) 12/31/2014 1023   ALBUMIN 3.7 12/20/2013 0901   AST 17 11/04/2017 0841   AST 24 12/31/2014 1023   AST 21 12/20/2013 0901   ALT 14  11/04/2017 0841   ALT 28 12/31/2014 1023   ALT 18 12/20/2013 0901   ALKPHOS 122 (H) 11/04/2017 0841   ALKPHOS 135 (H) 12/31/2014 1023   ALKPHOS 144 12/20/2013 0901   BILITOT 0.5 11/04/2017 0841   BILITOT 0.4 12/31/2014 1023   BILITOT 0.60 12/20/2013 0901   GFRNONAA >60 08/03/2017 1425   GFRNONAA 56 (L) 12/31/2014 1023   GFRNONAA 57 (L) 06/24/2014 1300   GFRAA >60 08/03/2017 1425   GFRAA >60 12/31/2014 1023   GFRAA >60 06/24/2014 1300    No results found for: SPEP, UPEP  Lab Results  Component Value Date   WBC 8.9 02/01/2018   NEUTROABS 6.6 (H) 02/01/2018   HGB 15.1 02/01/2018   HCT 43.5 02/01/2018   MCV 96.2 02/01/2018   PLT 286 02/01/2018      Chemistry      Component Value Date/Time   NA 139 11/04/2017 0841   NA 141 12/20/2013 0901   K 4.4 11/04/2017 0841   K 4.1 12/20/2013 0901   CL 106 11/04/2017 0841   CL 106 12/18/2012 0842   CO2 28 11/04/2017 0841   CO2 27 12/20/2013 0901   BUN 21 11/04/2017 0841   BUN 23.2 12/20/2013 0901   CREATININE 0.78 11/04/2017 0841   CREATININE 1.04 12/31/2014 1023   CREATININE 1.0 12/20/2013 0901      Component Value Date/Time   CALCIUM 9.4 11/04/2017 0841   CALCIUM 10.1 12/20/2013 0901   ALKPHOS 122 (H) 11/04/2017 0841   ALKPHOS 135 (H) 12/31/2014 1023   ALKPHOS 144 12/20/2013 0901   AST 17 11/04/2017 0841   AST 24 12/31/2014 1023   AST 21 12/20/2013 0901   ALT 14 11/04/2017 0841   ALT 28 12/31/2014 1023   ALT 18 12/20/2013 0901   BILITOT 0.5 11/04/2017 0841   BILITOT 0.4 12/31/2014 1023   BILITOT 0.60 12/20/2013 0901        RADIOGRAPHIC STUDIES: I have personally reviewed the radiological images as listed and agreed with the findings in the report. No results found.  IMPRESSION: Chest Impression:  1. Stable muscular thickening along the LEFT chest wall with calcifications and sclerotic rib change. No evidence of progression. 2. No evidence of pulmonary metastasis . 3. No lymphadenopathy  Abdomen / Pelvis Impression:  1. No evidence of metastatic disease in the abdomen pelvis. 2. Leiomyoma within the uterus.   Electronically Signed   By: Suzy Bouchard M.D.   On: 08/02/2017 14:19 ASSESSMENT & PLAN:   Cancer of sigmoid colon ([place]) # Metastatic colon cancer chest wall lung recurrence/ status post lobectomy- 2010. AUG 2018- CT of the chest and pelvis-NED- except for incidental finding of left chest wall calcification [stable]; approximately 4 cm fibroid in the uterus.    # Clinically no evidence of recurrence. CEA pending today.  Recommend continued surveillance with CT scans in 6 month; colonoscopy pending with Dr.Mann [[place]]  # DCIS status post lumpectomy- status post Aromasin.  Sep 2018- Mammogram negative.   # elevated Blood pressure- 160s/100s; but home controlled.continue checking at home.   # patient follow-up with me in 6 months/CBC CMP CEA; CT scan prior.    # 25 minutes face-to-face with the patient discussing the above plan of care; more than 50% of time spent on prognosis/ natural history; counseling and coordination.        Cammie Sickle, MD 02/01/2018 1:45 PM

## 2018-02-02 ENCOUNTER — Inpatient Hospital Stay: Payer: Medicare Other

## 2018-02-02 ENCOUNTER — Inpatient Hospital Stay: Payer: Medicare Other | Admitting: Internal Medicine

## 2018-02-02 LAB — CEA: CEA: 1.7 ng/mL (ref 0.0–4.7)

## 2018-02-14 DIAGNOSIS — Z1211 Encounter for screening for malignant neoplasm of colon: Secondary | ICD-10-CM | POA: Diagnosis not present

## 2018-02-14 DIAGNOSIS — Z86010 Personal history of colonic polyps: Secondary | ICD-10-CM | POA: Diagnosis not present

## 2018-02-14 DIAGNOSIS — R635 Abnormal weight gain: Secondary | ICD-10-CM | POA: Diagnosis not present

## 2018-02-14 DIAGNOSIS — Z85038 Personal history of other malignant neoplasm of large intestine: Secondary | ICD-10-CM | POA: Diagnosis not present

## 2018-03-08 DIAGNOSIS — Z1211 Encounter for screening for malignant neoplasm of colon: Secondary | ICD-10-CM | POA: Diagnosis not present

## 2018-03-08 DIAGNOSIS — K6389 Other specified diseases of intestine: Secondary | ICD-10-CM | POA: Diagnosis not present

## 2018-03-08 DIAGNOSIS — K635 Polyp of colon: Secondary | ICD-10-CM | POA: Diagnosis not present

## 2018-03-08 DIAGNOSIS — Z85038 Personal history of other malignant neoplasm of large intestine: Secondary | ICD-10-CM | POA: Diagnosis not present

## 2018-03-08 DIAGNOSIS — D120 Benign neoplasm of cecum: Secondary | ICD-10-CM | POA: Diagnosis not present

## 2018-03-08 LAB — HM COLONOSCOPY

## 2018-03-13 ENCOUNTER — Encounter: Payer: Self-pay | Admitting: Family Medicine

## 2018-03-29 ENCOUNTER — Inpatient Hospital Stay: Payer: Medicare Other | Attending: Internal Medicine

## 2018-03-29 DIAGNOSIS — Z86000 Personal history of in-situ neoplasm of breast: Secondary | ICD-10-CM | POA: Insufficient documentation

## 2018-03-29 DIAGNOSIS — Z85118 Personal history of other malignant neoplasm of bronchus and lung: Secondary | ICD-10-CM | POA: Diagnosis not present

## 2018-03-29 DIAGNOSIS — Z85038 Personal history of other malignant neoplasm of large intestine: Secondary | ICD-10-CM | POA: Insufficient documentation

## 2018-03-29 DIAGNOSIS — Z452 Encounter for adjustment and management of vascular access device: Secondary | ICD-10-CM | POA: Diagnosis not present

## 2018-03-29 DIAGNOSIS — Z95828 Presence of other vascular implants and grafts: Secondary | ICD-10-CM

## 2018-03-29 MED ORDER — SODIUM CHLORIDE 0.9% FLUSH
10.0000 mL | INTRAVENOUS | Status: AC | PRN
  Administered 2018-03-29: 10 mL
  Filled 2018-03-29: qty 10

## 2018-03-29 MED ORDER — HEPARIN SOD (PORK) LOCK FLUSH 100 UNIT/ML IV SOLN
500.0000 [IU] | INTRAVENOUS | Status: AC | PRN
  Administered 2018-03-29: 500 [IU]

## 2018-05-09 ENCOUNTER — Other Ambulatory Visit (INDEPENDENT_AMBULATORY_CARE_PROVIDER_SITE_OTHER): Payer: Medicare Other

## 2018-05-09 ENCOUNTER — Telehealth (INDEPENDENT_AMBULATORY_CARE_PROVIDER_SITE_OTHER): Payer: Medicare Other | Admitting: Family Medicine

## 2018-05-09 DIAGNOSIS — E559 Vitamin D deficiency, unspecified: Secondary | ICD-10-CM | POA: Diagnosis not present

## 2018-05-09 DIAGNOSIS — E7800 Pure hypercholesterolemia, unspecified: Secondary | ICD-10-CM | POA: Diagnosis not present

## 2018-05-09 DIAGNOSIS — M8589 Other specified disorders of bone density and structure, multiple sites: Secondary | ICD-10-CM

## 2018-05-09 DIAGNOSIS — E119 Type 2 diabetes mellitus without complications: Secondary | ICD-10-CM | POA: Diagnosis not present

## 2018-05-09 DIAGNOSIS — D509 Iron deficiency anemia, unspecified: Secondary | ICD-10-CM

## 2018-05-09 LAB — LIPID PANEL
CHOLESTEROL: 135 mg/dL (ref 0–200)
HDL: 36.8 mg/dL — ABNORMAL LOW (ref >39.00)
LDL CALC: 70 mg/dL (ref 0–99)
NonHDL: 97.7
Total CHOL/HDL Ratio: 4
Triglycerides: 138 mg/dL (ref 0.0–149.0)
VLDL: 27.6 mg/dL (ref 0.0–40.0)

## 2018-05-09 LAB — CBC WITH DIFFERENTIAL/PLATELET
BASOS PCT: 0.7 % (ref 0.0–3.0)
Basophils Absolute: 0.1 10*3/uL (ref 0.0–0.1)
EOS PCT: 3.3 % (ref 0.0–5.0)
Eosinophils Absolute: 0.3 10*3/uL (ref 0.0–0.7)
HCT: 44.4 % (ref 36.0–46.0)
Hemoglobin: 15.1 g/dL — ABNORMAL HIGH (ref 12.0–15.0)
LYMPHS ABS: 1.5 10*3/uL (ref 0.7–4.0)
Lymphocytes Relative: 18.6 % (ref 12.0–46.0)
MCHC: 34 g/dL (ref 30.0–36.0)
MCV: 96.8 fl (ref 78.0–100.0)
MONOS PCT: 7.6 % (ref 3.0–12.0)
Monocytes Absolute: 0.6 10*3/uL (ref 0.1–1.0)
NEUTROS ABS: 5.6 10*3/uL (ref 1.4–7.7)
NEUTROS PCT: 69.8 % (ref 43.0–77.0)
PLATELETS: 271 10*3/uL (ref 150.0–400.0)
RBC: 4.58 Mil/uL (ref 3.87–5.11)
RDW: 12.8 % (ref 11.5–15.5)
WBC: 8 10*3/uL (ref 4.0–10.5)

## 2018-05-09 LAB — COMPREHENSIVE METABOLIC PANEL
ALK PHOS: 120 U/L — AB (ref 39–117)
ALT: 15 U/L (ref 0–35)
AST: 15 U/L (ref 0–37)
Albumin: 4 g/dL (ref 3.5–5.2)
BUN: 22 mg/dL (ref 6–23)
CO2: 26 meq/L (ref 19–32)
Calcium: 9.4 mg/dL (ref 8.4–10.5)
Chloride: 106 mEq/L (ref 96–112)
Creatinine, Ser: 0.87 mg/dL (ref 0.40–1.20)
GFR: 67.64 mL/min (ref >60.00)
GLUCOSE: 123 mg/dL — AB (ref 70–99)
POTASSIUM: 4.1 meq/L (ref 3.5–5.1)
SODIUM: 140 meq/L (ref 135–145)
TOTAL PROTEIN: 7.5 g/dL (ref 6.0–8.3)
Total Bilirubin: 0.5 mg/dL (ref 0.2–1.2)

## 2018-05-09 LAB — HEMOGLOBIN A1C: HEMOGLOBIN A1C: 5.8 % (ref 4.6–6.5)

## 2018-05-09 LAB — VITAMIN D 25 HYDROXY (VIT D DEFICIENCY, FRACTURES): VITD: 30.18 ng/mL (ref 30.00–100.00)

## 2018-05-09 NOTE — Telephone Encounter (Signed)
-----   Message from Lendon Collar, RT sent at 05/02/2018 10:02 AM EDT ----- Regarding: Lab orders for Tuesday, June 4th Please enter lab orders for Tuesday June 4th. Thanks-Lauren

## 2018-05-12 ENCOUNTER — Encounter: Payer: Self-pay | Admitting: Family Medicine

## 2018-05-12 ENCOUNTER — Ambulatory Visit (INDEPENDENT_AMBULATORY_CARE_PROVIDER_SITE_OTHER): Payer: Medicare Other | Admitting: Family Medicine

## 2018-05-12 VITALS — BP 110/56 | HR 81 | Temp 98.6°F | Ht 63.0 in | Wt 204.1 lb

## 2018-05-12 DIAGNOSIS — E559 Vitamin D deficiency, unspecified: Secondary | ICD-10-CM | POA: Diagnosis not present

## 2018-05-12 DIAGNOSIS — E7800 Pure hypercholesterolemia, unspecified: Secondary | ICD-10-CM

## 2018-05-12 DIAGNOSIS — I10 Essential (primary) hypertension: Secondary | ICD-10-CM | POA: Diagnosis not present

## 2018-05-12 DIAGNOSIS — E119 Type 2 diabetes mellitus without complications: Secondary | ICD-10-CM | POA: Diagnosis not present

## 2018-05-12 LAB — HM DIABETES FOOT EXAM

## 2018-05-12 NOTE — Patient Instructions (Addendum)
Increase water some.  Follow BP.. If remaining low, let me know.  Try to increase exercise in a safe way.  Call if fatigue not improving as expected.

## 2018-05-12 NOTE — Assessment & Plan Note (Signed)
Good control at home on avapro. Slightly low  Today... Increase water some.

## 2018-05-12 NOTE — Assessment & Plan Note (Signed)
Well controlled. Continue current medication. Encouraged exercise, weight loss, healthy eating habits.  

## 2018-05-12 NOTE — Progress Notes (Signed)
Subjective:    Patient ID: Susan Duffy, female    DOB: 1946-05-29, 72 y.o.   MRN: [id number]  HPI  72 year old female pt presents for 6 month follow up   Clear mammogram and colonoscopy earlier this year.  Diabetes:   Good control on no medication Lab Results  Component Value Date   HGBA1C 5.8 05/09/2018  Using medications without difficulties: Hypoglycemic episodes: Hyperglycemic episodes: Feet problems: no uclers Blood Sugars averaging:not checking eye exam within last year: 07/2017   Hypertension:    Good control on avapro. BP Readings from Last 3 Encounters:  05/12/18 (!) 110/56  02/01/18 (!) 164/100  11/08/17 (!) 148/78  Using medication without problems or lightheadedness:  none Chest pain with exertion: none Edema: none Short of breath: none Average home BPs: 120-125/70s Other issues:   She has been feeling more tired gradually in last  3-4 months. Not sleeping great.  no snoring, no apnea.  No new meds.  No treatments. No bleeding. No pain.  Elevated Cholesterol:  At goal on simvastatin 40 mg daily Lab Results  Component Value Date   CHOL 135 05/09/2018   HDL 36.80 (L) 05/09/2018   LDLCALC 70 05/09/2018   LDLDIRECT 83.0 10/16/2015   TRIG 138.0 05/09/2018   CHOLHDL 4 05/09/2018  Using medications without problems: Muscle aches:  Diet compliance: moderate Exercise: minimal given she is concerned about balance. Other complaints:  Blood pressure (!) 110/56, pulse 81, temperature 98.6 F (37 C), temperature source Oral, height 5\' 3"  (1.6 m), weight 204 lb 1.9 oz (92.6 kg), SpO2 95 %. Social History /Family History/Past Medical History reviewed in detail and updated in EMR if needed.  Review of Systems  Constitutional: Negative for fatigue and fever.  HENT: Negative for congestion.   Eyes: Negative for pain.  Respiratory: Negative for cough and shortness of breath.   Cardiovascular: Negative for chest pain, palpitations and leg swelling.    Gastrointestinal: Negative for abdominal pain.  Genitourinary: Negative for dysuria and vaginal bleeding.  Musculoskeletal: Negative for back pain.  Neurological: Negative for syncope, light-headedness and headaches.  Psychiatric/Behavioral: Negative for dysphoric mood.       Objective:   Physical Exam  Constitutional: Vital signs are normal. She appears well-developed and well-nourished. She is cooperative.  Non-toxic appearance. She does not appear ill. No distress.  overweight  HENT:  Head: Normocephalic.  Right Ear: Hearing, tympanic membrane, external ear and ear canal normal. Tympanic membrane is not erythematous, not retracted and not bulging.  Left Ear: Hearing, tympanic membrane, external ear and ear canal normal. Tympanic membrane is not erythematous, not retracted and not bulging.  Nose: No mucosal edema or rhinorrhea. Right sinus exhibits no maxillary sinus tenderness and no frontal sinus tenderness. Left sinus exhibits no maxillary sinus tenderness and no frontal sinus tenderness.  Mouth/Throat: Uvula is midline, oropharynx is clear and moist and mucous membranes are normal.  Eyes: Pupils are equal, round, and reactive to light. Conjunctivae, EOM and lids are normal. Lids are everted and swept, no foreign bodies found.  Neck: Trachea normal and normal range of motion. Neck supple. Carotid bruit is not present. No thyroid mass and no thyromegaly present.  Cardiovascular: Normal rate, regular rhythm, S1 normal, S2 normal, normal heart sounds, intact distal pulses and normal pulses. Exam reveals no gallop and no friction rub.  No murmur heard. Pulmonary/Chest: Effort normal and breath sounds normal. No tachypnea. No respiratory distress. She has no decreased breath sounds. She has no wheezes.  She has no rhonchi. She has no rales.  Abdominal: Soft. Normal appearance and bowel sounds are normal. There is no tenderness.  Neurological: She is alert.  Skin: Skin is warm, dry and  intact. No rash noted.  Psychiatric: Her speech is normal and behavior is normal. Judgment and thought content normal. Her mood appears not anxious. Cognition and memory are normal. She does not exhibit a depressed mood.         Diabetic foot exam: Normal inspection No skin breakdown preulcerative calluses on base of bilateral great toes. Normal DP pulses Normal sensation to light touch and monofilament Nails normal  Assessment & Plan:

## 2018-05-12 NOTE — Assessment & Plan Note (Signed)
Good control on no med.

## 2018-05-12 NOTE — Assessment & Plan Note (Signed)
In nml range.

## 2018-05-23 ENCOUNTER — Other Ambulatory Visit: Payer: Self-pay | Admitting: Family Medicine

## 2018-05-31 ENCOUNTER — Inpatient Hospital Stay: Payer: Medicare Other | Attending: Internal Medicine

## 2018-05-31 DIAGNOSIS — Z452 Encounter for adjustment and management of vascular access device: Secondary | ICD-10-CM | POA: Diagnosis not present

## 2018-05-31 DIAGNOSIS — Z86000 Personal history of in-situ neoplasm of breast: Secondary | ICD-10-CM | POA: Insufficient documentation

## 2018-05-31 DIAGNOSIS — Z85118 Personal history of other malignant neoplasm of bronchus and lung: Secondary | ICD-10-CM | POA: Diagnosis not present

## 2018-05-31 DIAGNOSIS — Z95828 Presence of other vascular implants and grafts: Secondary | ICD-10-CM

## 2018-05-31 DIAGNOSIS — Z85038 Personal history of other malignant neoplasm of large intestine: Secondary | ICD-10-CM | POA: Insufficient documentation

## 2018-05-31 MED ORDER — SODIUM CHLORIDE 0.9% FLUSH
10.0000 mL | Freq: Once | INTRAVENOUS | Status: AC
  Administered 2018-05-31: 10 mL via INTRAVENOUS
  Filled 2018-05-31: qty 10

## 2018-05-31 MED ORDER — HEPARIN SOD (PORK) LOCK FLUSH 100 UNIT/ML IV SOLN
500.0000 [IU] | Freq: Once | INTRAVENOUS | Status: AC
  Administered 2018-05-31: 500 [IU] via INTRAVENOUS

## 2018-06-29 ENCOUNTER — Telehealth: Payer: Self-pay | Admitting: Family Medicine

## 2018-06-29 NOTE — Telephone Encounter (Signed)
Copied from [place] [id number]. Topic: Quick Communication - See Telephone Encounter >> Jun 29, 2018 10:03 AM Marja Kays F wrote: Pt is calling to see if something else can be called in instead of irbesartin it is on back order and [facility] [address] does not know when they will get it  And pt is almost out  Best number [phone]  [facility] [address]

## 2018-06-30 MED ORDER — LOSARTAN POTASSIUM 50 MG PO TABS: 50.0000 mg | ORAL_TABLET | Freq: Every day | ORAL | 3 refills | Status: DC

## 2018-06-30 NOTE — Telephone Encounter (Signed)
Spoke to pt. She has never tried losartan. Dr Diona Browner said to send in losartan 50mg . Rx sent.

## 2018-06-30 NOTE — Telephone Encounter (Signed)
Patient is requesting an alternative medication. Irbesartan is backordered.

## 2018-06-30 NOTE — Telephone Encounter (Signed)
Has she had any issues with losartan in past.. If Not I will send this in as a replacement. Let me know.

## 2018-07-28 ENCOUNTER — Ambulatory Visit
Admission: RE | Admit: 2018-07-28 | Discharge: 2018-07-28 | Disposition: A | Discharge status: Home / Self Care | Payer: Medicare Other | Source: Ambulatory Visit | Attending: Internal Medicine | Admitting: Internal Medicine

## 2018-07-28 DIAGNOSIS — C187 Malignant neoplasm of sigmoid colon: Secondary | ICD-10-CM | POA: Insufficient documentation

## 2018-07-28 DIAGNOSIS — Z85038 Personal history of other malignant neoplasm of large intestine: Secondary | ICD-10-CM | POA: Diagnosis not present

## 2018-07-28 DIAGNOSIS — R9389 Abnormal findings on diagnostic imaging of other specified body structures: Secondary | ICD-10-CM | POA: Diagnosis not present

## 2018-07-28 LAB — POCT I-STAT CREATININE: CREATININE: 0.8 mg/dL (ref 0.44–1.00)

## 2018-07-28 MED ORDER — IOPAMIDOL (ISOVUE-300) INJECTION 61%
100.0000 mL | Freq: Once | INTRAVENOUS | Status: AC | PRN
  Administered 2018-07-28: 100 mL via INTRAVENOUS

## 2018-08-01 ENCOUNTER — Other Ambulatory Visit: Payer: Self-pay | Admitting: Family Medicine

## 2018-08-01 DIAGNOSIS — Z1231 Encounter for screening mammogram for malignant neoplasm of breast: Secondary | ICD-10-CM

## 2018-08-02 ENCOUNTER — Inpatient Hospital Stay: Payer: Medicare Other | Attending: Internal Medicine

## 2018-08-02 ENCOUNTER — Inpatient Hospital Stay (HOSPITAL_BASED_OUTPATIENT_CLINIC_OR_DEPARTMENT_OTHER): Payer: Medicare Other | Admitting: Internal Medicine

## 2018-08-02 ENCOUNTER — Encounter: Payer: Self-pay | Admitting: Internal Medicine

## 2018-08-02 ENCOUNTER — Other Ambulatory Visit: Payer: Self-pay

## 2018-08-02 DIAGNOSIS — I10 Essential (primary) hypertension: Secondary | ICD-10-CM | POA: Diagnosis not present

## 2018-08-02 DIAGNOSIS — Z803 Family history of malignant neoplasm of breast: Secondary | ICD-10-CM | POA: Insufficient documentation

## 2018-08-02 DIAGNOSIS — D259 Leiomyoma of uterus, unspecified: Secondary | ICD-10-CM | POA: Insufficient documentation

## 2018-08-02 DIAGNOSIS — Z9221 Personal history of antineoplastic chemotherapy: Secondary | ICD-10-CM | POA: Diagnosis not present

## 2018-08-02 DIAGNOSIS — Z801 Family history of malignant neoplasm of trachea, bronchus and lung: Secondary | ICD-10-CM

## 2018-08-02 DIAGNOSIS — Z86718 Personal history of other venous thrombosis and embolism: Secondary | ICD-10-CM | POA: Insufficient documentation

## 2018-08-02 DIAGNOSIS — Z85038 Personal history of other malignant neoplasm of large intestine: Secondary | ICD-10-CM | POA: Insufficient documentation

## 2018-08-02 DIAGNOSIS — Z800 Family history of malignant neoplasm of digestive organs: Secondary | ICD-10-CM | POA: Insufficient documentation

## 2018-08-02 DIAGNOSIS — R9389 Abnormal findings on diagnostic imaging of other specified body structures: Secondary | ICD-10-CM | POA: Diagnosis not present

## 2018-08-02 DIAGNOSIS — Z86000 Personal history of in-situ neoplasm of breast: Secondary | ICD-10-CM

## 2018-08-02 DIAGNOSIS — C187 Malignant neoplasm of sigmoid colon: Secondary | ICD-10-CM

## 2018-08-02 LAB — CBC WITH DIFFERENTIAL/PLATELET
BASOS ABS: 0.1 10*3/uL (ref 0–0.1)
Basophils Relative: 1 %
Eosinophils Absolute: 0.2 10*3/uL (ref 0–0.7)
Eosinophils Relative: 2 %
HCT: 42.6 % (ref 35.0–47.0)
HEMOGLOBIN: 14.6 g/dL (ref 12.0–16.0)
Lymphocytes Relative: 19 %
Lymphs Abs: 1.6 10*3/uL (ref 1.0–3.6)
MCH: 33.4 pg (ref 26.0–34.0)
MCHC: 34.2 g/dL (ref 32.0–36.0)
MCV: 97.6 fL (ref 80.0–100.0)
Monocytes Absolute: 0.8 10*3/uL (ref 0.2–0.9)
Monocytes Relative: 9 %
NEUTROS PCT: 69 %
Neutro Abs: 6.1 10*3/uL (ref 1.4–6.5)
Platelets: 287 10*3/uL (ref 150–440)
RBC: 4.36 MIL/uL (ref 3.80–5.20)
RDW: 12.5 % (ref 11.5–14.5)
WBC: 8.7 10*3/uL (ref 3.6–11.0)

## 2018-08-02 LAB — COMPREHENSIVE METABOLIC PANEL
ALK PHOS: 108 U/L (ref 38–126)
ALT: 19 U/L (ref 0–44)
AST: 24 U/L (ref 15–41)
Albumin: 4 g/dL (ref 3.5–5.0)
Anion gap: 8 (ref 5–15)
BUN: 20 mg/dL (ref 8–23)
CO2: 25 mmol/L (ref 22–32)
Calcium: 9.5 mg/dL (ref 8.9–10.3)
Chloride: 106 mmol/L (ref 98–111)
Creatinine, Ser: 0.78 mg/dL (ref 0.44–1.00)
GFR calc Af Amer: >60 mL/min (ref >60)
GFR calc non Af Amer: >60 mL/min (ref >60)
Glucose, Bld: 102 mg/dL — ABNORMAL HIGH (ref 70–99)
Potassium: 4.3 mmol/L (ref 3.5–5.1)
Sodium: 139 mmol/L (ref 135–145)
TOTAL PROTEIN: 8.3 g/dL — AB (ref 6.5–8.1)
Total Bilirubin: 0.5 mg/dL (ref 0.3–1.2)

## 2018-08-02 MED ORDER — SODIUM CHLORIDE 0.9% FLUSH
10.0000 mL | Freq: Once | INTRAVENOUS | Status: AC
  Administered 2018-08-02: 10 mL via INTRAVENOUS
  Filled 2018-08-02: qty 10

## 2018-08-02 MED ORDER — HEPARIN SOD (PORK) LOCK FLUSH 100 UNIT/ML IV SOLN
500.0000 [IU] | Freq: Once | INTRAVENOUS | Status: AC
  Administered 2018-08-02: 500 [IU] via INTRAVENOUS

## 2018-08-02 NOTE — Assessment & Plan Note (Addendum)
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 AUG 2019- NED; fibroid uterus; endometrial thickening [see discussion below].   # Clinically no evidence of recurrence. CEA pending today.   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  Sep 2018- Mammogram negative.   # elevated Blood pressure- 140s- improved.  #Endometrial thickening-no discharge no bleeding.  Recommend follow-up with gynecology.  # continue port flush every 2 months; patient follow-up with me in 6 months/CBC CMP CEA.     # 25 minutes face-to-face with the patient discussing the above plan of care; more than 50% of time spent on prognosis/ natural history; counseling and coordination.

## 2018-08-02 NOTE — Progress Notes (Signed)
[place] OFFICE PROGRESS NOTE  Patient Care Team: Jinny Sanders, MD as PCP - General  Cancer Staging No matching staging information was found for the patient.   Oncology History   # 2005- COLON CA [Stage II- left sided; s/p Surgery in [facility] [place]]; no Adj chemo.   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   # Nov 2010-LLE DVT [on Tam] on coumadin; AUG 2017- STOP coumadin  # ; PN-G-1; port flush q 6 w  # Left kidney cyst [Feb 2017]  # colonoscopy- 2019 [Dr.Mann; [place]]  DIAGNOSIS: METASTATIC COLON CA  STAGE: IV; NED        ;GOALS: control  CURRENT/MOST RECENT THERAPY: surveillance      Cancer of sigmoid colon ([place])      INTERVAL HISTORY:  Susan Duffy 72 y.o.  female pleasant patient above history of metastatic colon cancer currently on surveillance is here for follow-up.  She is here to review the results of the CT scan.  Patient denies any unusual nausea vomiting weight loss.  Appetite is good.  No blood in stools black or stools.  No vaginal bleeding.  Review of Systems  Constitutional: Negative for chills, diaphoresis, fever, malaise/fatigue and weight loss.  HENT: Negative for nosebleeds and sore throat.   Eyes: Negative for double vision.  Respiratory: Negative for cough, hemoptysis, sputum production, shortness of breath and wheezing.   Cardiovascular: Negative for chest pain, palpitations, orthopnea and leg swelling.  Gastrointestinal: Negative for abdominal pain, blood in stool, constipation, diarrhea, heartburn, melena, nausea and vomiting.  Genitourinary: Negative for dysuria, frequency and urgency.  Musculoskeletal: Positive for back pain and joint pain.  Skin: Negative.  Negative  for itching and rash.  Neurological: Negative for dizziness, tingling, focal weakness, weakness and headaches.  Endo/Heme/Allergies: Does not bruise/bleed easily.  Psychiatric/Behavioral: Negative for depression. The patient is not nervous/anxious and does not have insomnia.       PAST MEDICAL HISTORY :  Past Medical History:  Diagnosis Date  . Allergy   . Breast CA ([place])   . Breast cancer ([place]) 2010   in situ and took radiation, no chemo needed  . Colon cancer ([place])    stage 4 colon cancer  . History of chemotherapy 02/2011-08/2011   FOLFOX/AVASTIN- 8 CYCLES  . History of DVT (deep vein thrombosis) 2010   while taking tamoxifen-left lower extremity  . History of peripheral neuropathy   . Hyperlipidemia   . Hypertension   . IDA (iron deficiency anemia)   . Osteoarthritis   . TIA (transient ischemic attack) 03/08/2011  . Vitamin D deficiency     PAST SURGICAL HISTORY :   Past Surgical History:  Procedure Laterality Date  . BREAST EXCISIONAL BIOPSY Left 1-10   s/p lumpectomy and radiation left dcis  . BREAST EXCISIONAL BIOPSY Left    fibroadeoma reoved age 47  . BREAST LUMPECTOMY  2010   left breast  . broken ankle  Left 08/2012  . COLON SURGERY     colectomy, partial  . LOBECTOMY  06-2009   metastatic colon cancer    FAMILY HISTORY :   Family History  Problem Relation Age of Onset  . Hypertension Mother   . Stroke Mother   . Coronary  artery disease Mother   . Thyroid disease Mother   . Diabetes Father   . Hypertension Father   . Lung cancer Father   . Heart attack Maternal Grandfather   . Breast cancer Maternal Grandmother   . Thyroid disease Brother   . Diabetes Paternal Grandmother   . Colon cancer Paternal Grandfather   . Deep vein thrombosis Unknown     SOCIAL HISTORY:   Social History   Tobacco Use  . Smoking status: Never Smoker  . Smokeless tobacco: Never Used  Substance Use Topics  . Alcohol use: No  . Drug use: No    ALLERGIES:  is allergic  to amoxicillin; erythromycin; and nitrofuran derivatives.  MEDICATIONS:  Current Outpatient Medications  Medication Sig Dispense Refill  . acetaminophen (TYLENOL) 500 MG tablet as needed for moderate pain. Take 1-2 by mouth every 6 hours as needed     . aspirin 81 MG chewable tablet Chew 81 mg by mouth daily.    . cholecalciferol (VITAMIN D) 400 UNITS TABS tablet Take 400 Units by mouth 2 (two) times daily.    . Cyanocobalamin (VITAMIN B-12) 2000 MCG TBCR Take 1 tablet by mouth daily.      Marland Kitchen lidocaine-prilocaine (EMLA) cream Apply 1 application topically as needed. To port a cath site approximately 1 to 2 hours prior to port needle insertion. 30 g 12  . losartan (COZAAR) 50 MG tablet Take 1 tablet (50 mg total) by mouth daily. 90 tablet 3  . simvastatin (ZOCOR) 40 MG tablet TAKE 1 TABLET (40 MG TOTAL) BY MOUTH AT BEDTIME. 90 tablet 3   No current facility-administered medications for this visit.    Facility-Administered Medications Ordered in Other Visits  Medication Dose Route Frequency Provider Last Rate Last Dose  . sodium chloride flush (NS) 0.9 % injection 10 mL  10 mL Intravenous PRN Cammie Sickle, MD   10 mL at 01/05/16 1135    PHYSICAL EXAMINATION: ECOG PERFORMANCE STATUS: 0 - Asymptomatic  BP (!) 141/86 (Patient Position: Sitting)   Pulse 84   Temp (!) 97 F (36.1 C) (Tympanic)   Resp 20   Ht 5\' 3"  (1.6 m)   Wt 205 lb 14.4 oz (93.4 kg)   BMI 36.47 kg/m   Filed Weights   08/02/18 1403  Weight: 205 lb 14.4 oz (93.4 kg)   Physical Exam  Constitutional: She is oriented to person, place, and time and well-developed, well-nourished, and in no distress.  Obese.  Walks by herself.  She is alone.  HENT:  Head: Normocephalic and atraumatic.  Mouth/Throat: Oropharynx is clear and moist. No oropharyngeal exudate.  Eyes: Pupils are equal, round, and reactive to light.  Neck: Normal range of motion. Neck supple.  Cardiovascular: Normal rate and regular rhythm.   Pulmonary/Chest: No respiratory distress. She has no wheezes.  Abdominal: Soft. Bowel sounds are normal. She exhibits no distension and no mass. There is no tenderness. There is no rebound and no guarding.  Musculoskeletal: Normal range of motion. She exhibits no edema or tenderness.  Neurological: She is alert and oriented to person, place, and time.  Skin: Skin is warm.  Psychiatric: Affect normal.    SKIN:  no rashes or significant lesions    LABORATORY DATA:  I have reviewed the data as listed    Component Value Date/Time   NA 139 08/02/2018 1328   NA 141 12/20/2013 0901   K 4.3 08/02/2018 1328   K 4.1 12/20/2013 0901   CL 106  08/02/2018 1328   CL 106 12/18/2012 0842   CO2 25 08/02/2018 1328   CO2 27 12/20/2013 0901   GLUCOSE 102 (H) 08/02/2018 1328   GLUCOSE 103 12/20/2013 0901   GLUCOSE 109 (H) 12/18/2012 0842   BUN 20 08/02/2018 1328   BUN 23.2 12/20/2013 0901   CREATININE 0.78 08/02/2018 1328   CREATININE 1.04 12/31/2014 1023   CREATININE 1.0 12/20/2013 0901   CALCIUM 9.5 08/02/2018 1328   CALCIUM 10.1 12/20/2013 0901   PROT 8.3 (H) 08/02/2018 1328   PROT 7.9 12/31/2014 1023   PROT 8.5 (H) 12/20/2013 0901   ALBUMIN 4.0 08/02/2018 1328   ALBUMIN 3.3 (L) 12/31/2014 1023   ALBUMIN 3.7 12/20/2013 0901   AST 24 08/02/2018 1328   AST 24 12/31/2014 1023   AST 21 12/20/2013 0901   ALT 19 08/02/2018 1328   ALT 28 12/31/2014 1023   ALT 18 12/20/2013 0901   ALKPHOS 108 08/02/2018 1328   ALKPHOS 135 (H) 12/31/2014 1023   ALKPHOS 144 12/20/2013 0901   BILITOT 0.5 08/02/2018 1328   BILITOT 0.4 12/31/2014 1023   BILITOT 0.60 12/20/2013 0901   GFRNONAA >60 08/02/2018 1328   GFRNONAA 56 (L) 12/31/2014 1023   GFRNONAA 57 (L) 06/24/2014 1300   GFRAA >60 08/02/2018 1328   GFRAA >60 12/31/2014 1023   GFRAA >60 06/24/2014 1300    No results found for: SPEP, UPEP  Lab Results  Component Value Date   WBC 8.7 08/02/2018   NEUTROABS 6.1 08/02/2018   HGB 14.6  08/02/2018   HCT 42.6 08/02/2018   MCV 97.6 08/02/2018   PLT 287 08/02/2018      Chemistry      Component Value Date/Time   NA 139 08/02/2018 1328   NA 141 12/20/2013 0901   K 4.3 08/02/2018 1328   K 4.1 12/20/2013 0901   CL 106 08/02/2018 1328   CL 106 12/18/2012 0842   CO2 25 08/02/2018 1328   CO2 27 12/20/2013 0901   BUN 20 08/02/2018 1328   BUN 23.2 12/20/2013 0901   CREATININE 0.78 08/02/2018 1328   CREATININE 1.04 12/31/2014 1023   CREATININE 1.0 12/20/2013 0901      Component Value Date/Time   CALCIUM 9.5 08/02/2018 1328   CALCIUM 10.1 12/20/2013 0901   ALKPHOS 108 08/02/2018 1328   ALKPHOS 135 (H) 12/31/2014 1023   ALKPHOS 144 12/20/2013 0901   AST 24 08/02/2018 1328   AST 24 12/31/2014 1023   AST 21 12/20/2013 0901   ALT 19 08/02/2018 1328   ALT 28 12/31/2014 1023   ALT 18 12/20/2013 0901   BILITOT 0.5 08/02/2018 1328   BILITOT 0.4 12/31/2014 1023   BILITOT 0.60 12/20/2013 0901       RADIOGRAPHIC STUDIES: I have personally reviewed the radiological images as listed and agreed with the findings in the report. No results found.   ASSESSMENT & PLAN:  Cancer of sigmoid colon [facility]) # Metastatic colon cancer chest wall lung recurrence/ status post lobectomy- 2010. AUG 2019- NED; fibroid uterus; endometrial thickening [see discussion below].   # Clinically no evidence of recurrence. CEA pending today.   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  Sep 2018- Mammogram negative.   # elevated Blood pressure- 140s- improved.  #Endometrial thickening-no discharge no bleeding.  Recommend follow-up with gynecology.  # continue port flush every 2 months; patient follow-up with me in 6 months/CBC CMP CEA.     # 25 minutes face-to-face with the patient discussing the  above plan of care; more than 50% of time spent on prognosis/ natural history; counseling and coordination.   Orders Placed This Encounter  Procedures  . CBC with Differential/Platelet     Standing Status:   Future    Standing Expiration Date:   08/03/2019  . Comprehensive metabolic panel    Standing Status:   Future    Standing Expiration Date:   08/03/2019  . CEA    Standing Status:   Future    Standing Expiration Date:   08/03/2019   All questions were answered. The patient knows to call the clinic with any problems, questions or concerns.      Cammie Sickle, MD 08/18/2018 5:45 PM

## 2018-08-03 LAB — CEA: CEA: 1.6 ng/mL (ref 0.0–4.7)

## 2018-08-09 DIAGNOSIS — H2513 Age-related nuclear cataract, bilateral: Secondary | ICD-10-CM | POA: Diagnosis not present

## 2018-09-04 ENCOUNTER — Ambulatory Visit
Admission: RE | Admit: 2018-09-04 | Discharge: 2018-09-04 | Disposition: A | Discharge status: Home / Self Care | Payer: Medicare Other | Source: Ambulatory Visit | Attending: Family Medicine | Admitting: Family Medicine

## 2018-09-04 DIAGNOSIS — Z23 Encounter for immunization: Secondary | ICD-10-CM | POA: Diagnosis not present

## 2018-09-04 DIAGNOSIS — Z1231 Encounter for screening mammogram for malignant neoplasm of breast: Secondary | ICD-10-CM | POA: Diagnosis not present

## 2018-09-04 HISTORY — DX: Personal history of antineoplastic chemotherapy: Z92.21

## 2018-09-04 HISTORY — DX: Personal history of irradiation: Z92.3

## 2018-10-03 ENCOUNTER — Other Ambulatory Visit: Payer: Self-pay | Admitting: Internal Medicine

## 2018-10-03 DIAGNOSIS — D0511 Intraductal carcinoma in situ of right breast: Secondary | ICD-10-CM

## 2018-10-04 ENCOUNTER — Inpatient Hospital Stay: Payer: Medicare Other | Attending: Internal Medicine

## 2018-10-04 DIAGNOSIS — Z85038 Personal history of other malignant neoplasm of large intestine: Secondary | ICD-10-CM | POA: Insufficient documentation

## 2018-10-04 DIAGNOSIS — Z86000 Personal history of in-situ neoplasm of breast: Secondary | ICD-10-CM | POA: Diagnosis not present

## 2018-10-04 DIAGNOSIS — Z95828 Presence of other vascular implants and grafts: Secondary | ICD-10-CM

## 2018-10-04 DIAGNOSIS — D0511 Intraductal carcinoma in situ of right breast: Secondary | ICD-10-CM

## 2018-10-04 LAB — CBC WITH DIFFERENTIAL/PLATELET
Abs Immature Granulocytes: 0.03 10*3/uL (ref 0.00–0.07)
Basophils Absolute: 0.1 10*3/uL (ref 0.0–0.1)
Basophils Relative: 1 %
EOS ABS: 0.2 10*3/uL (ref 0.0–0.5)
EOS PCT: 2 %
HEMATOCRIT: 42.8 % (ref 36.0–46.0)
HEMOGLOBIN: 14.2 g/dL (ref 12.0–15.0)
Immature Granulocytes: 0 %
LYMPHS PCT: 18 %
Lymphs Abs: 1.6 10*3/uL (ref 0.7–4.0)
MCH: 32.1 pg (ref 26.0–34.0)
MCHC: 33.2 g/dL (ref 30.0–36.0)
MCV: 96.8 fL (ref 80.0–100.0)
MONO ABS: 0.8 10*3/uL (ref 0.1–1.0)
Monocytes Relative: 8 %
NRBC: 0 % (ref 0.0–0.2)
Neutro Abs: 6.5 10*3/uL (ref 1.7–7.7)
Neutrophils Relative %: 71 %
Platelets: 247 10*3/uL (ref 150–400)
RBC: 4.42 MIL/uL (ref 3.87–5.11)
RDW: 11.9 % (ref 11.5–15.5)
WBC: 9.1 10*3/uL (ref 4.0–10.5)

## 2018-10-04 LAB — COMPREHENSIVE METABOLIC PANEL
ALK PHOS: 98 U/L (ref 38–126)
ALT: 19 U/L (ref 0–44)
AST: 21 U/L (ref 15–41)
Albumin: 3.9 g/dL (ref 3.5–5.0)
Anion gap: 5 (ref 5–15)
BILIRUBIN TOTAL: 0.6 mg/dL (ref 0.3–1.2)
BUN: 16 mg/dL (ref 8–23)
CALCIUM: 9.1 mg/dL (ref 8.9–10.3)
CHLORIDE: 107 mmol/L (ref 98–111)
CO2: 27 mmol/L (ref 22–32)
CREATININE: 0.78 mg/dL (ref 0.44–1.00)
Glucose, Bld: 119 mg/dL — ABNORMAL HIGH (ref 70–99)
Potassium: 3.8 mmol/L (ref 3.5–5.1)
Sodium: 139 mmol/L (ref 135–145)
Total Protein: 7.9 g/dL (ref 6.5–8.1)

## 2018-10-04 MED ORDER — SODIUM CHLORIDE 0.9% FLUSH
10.0000 mL | Freq: Once | INTRAVENOUS | Status: AC
  Administered 2018-10-04: 10 mL via INTRAVENOUS
  Filled 2018-10-04: qty 10

## 2018-10-04 MED ORDER — HEPARIN SOD (PORK) LOCK FLUSH 100 UNIT/ML IV SOLN
500.0000 [IU] | Freq: Once | INTRAVENOUS | Status: AC
  Administered 2018-10-04: 500 [IU] via INTRAVENOUS

## 2018-10-05 LAB — CEA: CEA: 1.6 ng/mL (ref 0.0–4.7)

## 2018-11-14 ENCOUNTER — Telehealth: Payer: Self-pay | Admitting: Family Medicine

## 2018-11-14 ENCOUNTER — Ambulatory Visit (INDEPENDENT_AMBULATORY_CARE_PROVIDER_SITE_OTHER): Payer: Medicare Other

## 2018-11-14 VITALS — BP 130/86 | HR 76 | Temp 97.5°F | Ht 63.0 in | Wt 203.0 lb

## 2018-11-14 DIAGNOSIS — Z0000 Encounter for general adult medical examination without abnormal findings: Secondary | ICD-10-CM | POA: Diagnosis not present

## 2018-11-14 DIAGNOSIS — D509 Iron deficiency anemia, unspecified: Secondary | ICD-10-CM

## 2018-11-14 DIAGNOSIS — E559 Vitamin D deficiency, unspecified: Secondary | ICD-10-CM

## 2018-11-14 DIAGNOSIS — E119 Type 2 diabetes mellitus without complications: Secondary | ICD-10-CM

## 2018-11-14 DIAGNOSIS — E7800 Pure hypercholesterolemia, unspecified: Secondary | ICD-10-CM

## 2018-11-14 LAB — LIPID PANEL
CHOLESTEROL: 135 mg/dL (ref 0–200)
HDL: 39.4 mg/dL (ref >39.00)
NonHDL: 95.66
Total CHOL/HDL Ratio: 3
Triglycerides: 220 mg/dL — ABNORMAL HIGH (ref 0.0–149.0)
VLDL: 44 mg/dL — ABNORMAL HIGH (ref 0.0–40.0)

## 2018-11-14 LAB — HEMOGLOBIN A1C: Hgb A1c MFr Bld: 5.8 % (ref 4.6–6.5)

## 2018-11-14 LAB — LDL CHOLESTEROL, DIRECT: Direct LDL: 74 mg/dL

## 2018-11-14 NOTE — Telephone Encounter (Signed)
-----   Message from Eustace Pen, LPN sent at 32/03/4009  3:44 PM EST ----- Regarding: labs 12/10 Lab orders needed. Thank you.  Insurance:  [employer]

## 2018-11-14 NOTE — Progress Notes (Signed)
I reviewed health advisor's note, was available for consultation, and agree with documentation and plan.  

## 2018-11-14 NOTE — Progress Notes (Signed)
PCP notes:   Health maintenance:  Tetanus vaccine - postponed/insurance A1C - completed  Abnormal screenings:   None  Patient concerns:   None  Nurse concerns:  None  Next PCP appt:   11/28/18 @ 1000

## 2018-11-14 NOTE — Progress Notes (Signed)
Subjective:   Susan Duffy is a 72 y.o. female who presents for Medicare Annual/Subsequent preventive examination.  Review of Systems:  N/A Cardiac Risk Factors include: advanced age (>71men, >45 women);obesity (BMI >30kg/m2)     Objective:    Vitals: BP 130/86 (BP Location: Right Arm, Patient Position: Sitting, Cuff Size: Large)   Pulse 76   Temp (!) 97.5 F (36.4 C) (Oral)   Ht 5\' 3"  (1.6 m) Comment: shoes  Wt 203 lb (92.1 kg)   SpO2 95%   BMI 35.96 kg/m   Body mass index is 35.96 kg/m.  Advanced Directives 11/14/2018 08/02/2018 08/02/2018 02/01/2018 08/03/2017 02/02/2017 08/02/2016  Does Patient Have a Medical Advance Directive? Yes No No Yes Yes Yes Yes  Type of Paramedic of [place];Living will - - Living will;Healthcare Power of Attorney Living will;Healthcare Power of [place];Living will -  Does patient want to make changes to medical advance directive? - - - No - Patient declined No - Patient declined - -  Copy of [place] in Chart? No - copy requested - - No - copy requested No - copy requested No - copy requested -  Would patient like information on creating a medical advance directive? - No - Patient declined No - Patient declined - - - -    Tobacco Social History   Tobacco Use  Smoking Status Never Smoker  Smokeless Tobacco Never Used     Counseling given: No   Clinical Intake:  Pre-visit preparation completed: Yes  Pain : No/denies pain Pain Score: 0-No pain     Nutritional Status: BMI > 30  Obese Nutritional Risks: None Diabetes: No  How often do you need to have someone help you when you read instructions, pamphlets, or other written materials from your doctor or pharmacy?: 1 - Never What is the last grade level you completed in school?: Master degree     Comments: pt is a widow; son and patient live together Information entered by :: LPinson, LPN  Past Medical  History:  Diagnosis Date  . Allergy   . Breast CA ([place])   . Breast cancer ([place]) 2010   in situ and took radiation, no chemo needed  . Colon cancer ([place])    stage 4 colon cancer  . History of chemotherapy 02/2011-08/2011   FOLFOX/AVASTIN- 8 CYCLES  . History of DVT (deep vein thrombosis) 2010   while taking tamoxifen-left lower extremity  . History of peripheral neuropathy   . Hyperlipidemia   . Hypertension   . IDA (iron deficiency anemia)   . Osteoarthritis   . Personal history of chemotherapy    colon ca  . Personal history of radiation therapy    breast and colon  . TIA (transient ischemic attack) 03/08/2011  . Vitamin D deficiency    Past Surgical History:  Procedure Laterality Date  . BREAST EXCISIONAL BIOPSY Left 1-10   s/p lumpectomy and radiation left dcis  . BREAST EXCISIONAL BIOPSY Left    fibroadeoma reoved age 54  . BREAST LUMPECTOMY  2010   left breast  . broken ankle  Left 08/2012  . COLON SURGERY     colectomy, partial  . LOBECTOMY  06-2009   metastatic colon cancer   Family History  Problem Relation Age of Onset  . Hypertension Mother   . Stroke Mother   . Coronary artery disease Mother   . Thyroid disease Mother   . Diabetes Father   .  Hypertension Father   . Lung cancer Father   . Heart attack Maternal Grandfather   . Breast cancer Maternal Grandmother   . Thyroid disease Brother   . Diabetes Paternal Grandmother   . Colon cancer Paternal Grandfather   . Deep vein thrombosis Unknown    Social History   Socioeconomic History  . Marital status: Single    Spouse name: Not on file  . Number of children: Not on file  . Years of education: Not on file  . Highest education level: Not on file  Occupational History  . 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 Financial resource strain: Not on file  . Food insecurity:    Worry: Not on file    Inability: Not on file  . Transportation needs:     Medical: Not on file    Non-medical: Not on file  Tobacco Use  . Smoking status: Never Smoker  . Smokeless tobacco: Never Used  Substance and Sexual Activity  . Alcohol use: No  . Drug use: No  . Sexual activity: Not Currently  Lifestyle  . Physical activity:    Days per week: Not on file    Minutes per session: Not on file  . Stress: Not on file  Relationships  . Social connections:    Talks on phone: Not on file    Gets together: Not on file    Attends religious service: Not on file    Active member of club or organization: Not on file    Attends meetings of clubs or organizations: Not on file    Relationship status: Not on file  Other Topics Concern  . Not on file  Social History Narrative   Patient walks .   Patient eats fruits and veggies, avoids salt and sugar, no fried food    Has living will, full code, HCPOA Lianna Sitzmann, son (reviewed 2014)    Outpatient Encounter Medications as of 11/14/2018  Medication Sig  . acetaminophen (TYLENOL) 500 MG tablet as needed for moderate pain. Take 1-2 by mouth every 6 hours as needed   . aspirin 81 MG chewable tablet Chew 81 mg by mouth daily.  . cholecalciferol (VITAMIN D) 400 UNITS TABS tablet Take 400 Units by mouth 2 (two) times daily.  . Cyanocobalamin (VITAMIN B-12) 2000 MCG TBCR Take 1 tablet by mouth daily.    Marland Kitchen lidocaine-prilocaine (EMLA) cream Apply 1 application topically as needed. To port a cath site approximately 1 to 2 hours prior to port needle insertion.  Marland Kitchen losartan (COZAAR) 50 MG tablet Take 1 tablet (50 mg total) by mouth daily.  . simvastatin (ZOCOR) 40 MG tablet TAKE 1 TABLET (40 MG TOTAL) BY MOUTH AT BEDTIME.   Facility-Administered Encounter Medications as of 11/14/2018  Medication  . sodium chloride flush (NS) 0.9 % injection 10 mL    Activities of Daily Living In your present state of health, do you have any difficulty performing the following activities: 11/14/2018 05/12/2018  Hearing? N N    Vision? N N  Difficulty concentrating or making decisions? N N  Walking or climbing stairs? N Y  Comment - Uses a cane  Dressing or bathing? N N  Doing errands, shopping? N N  Preparing Food and eating ? N -  Using the Toilet? N -  In the past six months, have you accidently leaked urine? N -  Do you have problems with loss of  bowel control? N -  Managing your Medications? N -  Managing your Finances? N -  Housekeeping or managing your Housekeeping? N -  Some recent data might be hidden    Patient Care Team: Jinny Sanders, MD as PCP - General   Assessment:   This is a routine wellness examination for [place].   Hearing Screening   125Hz  250Hz  500Hz  1000Hz  2000Hz  3000Hz  4000Hz  6000Hz  8000Hz   Right ear:   40 40 40  40    Left ear:   40 40 40  40    Vision Screening Comments: Vision exam in Sept 2019 @ [facility] and Dietary recommendations Current Exercise Habits: Home exercise routine, Type of exercise: walking, Time (Minutes): 30, Frequency (Times/Week): 3, Weekly Exercise (Minutes/Week): 90, Intensity: Mild, Exercise limited by: None identified  Goals    . Increase physical activity     Starting 11/14/2018, I will continue to walk for 30 minutes 3 days per week.        Fall Risk Fall Risk  11/14/2018 11/08/2017 11/01/2016 10/24/2015 10/22/2014  Falls in the past year? 0 No Yes No No  Number falls in past yr: - - 1 - -  Injury with Fall? - - No - -  Risk for fall due to : - - - - -  Risk for fall due to: Comment - - - - -   Depression Screen PHQ 2/9 Scores 11/14/2018 11/08/2017 11/01/2016 10/24/2015  PHQ - 2 Score 0 0 0 0  PHQ- 9 Score 0 - - -    Cognitive Function MMSE - Mini Mental State Exam 11/14/2018  Orientation to time 5  Orientation to Place 5  Registration 3  Attention/ Calculation 0  Recall 3  Language- name 2 objects 0  Language- repeat 1  Language- follow 3 step command 3  Language- read & follow direction 0   Write a sentence 0  Copy design 0  Total score 20       PLEASE NOTE: A Mini-Cog screen was completed. Maximum score is 20. A value of 0 denotes this part of [place] MMSE was not completed or the patient failed this part of the Mini-Cog screening.   Mini-Cog Screening Orientation to Time - Max 5 pts Orientation to Place - Max 5 pts Registration - Max 3 pts Recall - Max 3 pts Language Repeat - Max 1 pts Language Follow 3 Step Command - Max 3 pts   Immunization History  Administered Date(s) Administered  . Influenza Split 09/08/2011  . Influenza Whole 09/12/2008, 09/03/2010, 08/23/2012  . Influenza, High Dose Seasonal PF 08/31/2013, 09/04/2014, 09/10/2015, 09/10/2016, 09/06/2017, 09/04/2018  . Pneumococcal Conjugate-13 10/22/2014  . Pneumococcal Polysaccharide-23 11/06/2010  . Td 10/17/2008    Screening Tests Health Maintenance  Topic Date Due  . TETANUS/TDAP  12/06/2019 (Originally 10/17/2018)  . FOOT EXAM  05/13/2019  . HEMOGLOBIN A1C  05/16/2019  . OPHTHALMOLOGY EXAM  08/10/2019  . MAMMOGRAM  09/05/2019  . COLONOSCOPY  03/09/2023  . INFLUENZA VACCINE  Completed  . DEXA SCAN  Completed  . Hepatitis C Screening  Completed  . PNA vac Low Risk Adult  Completed       Plan:     I have personally reviewed, addressed, and noted the following in the patient's chart:  A. Medical and social history B. Use of alcohol, tobacco or illicit drugs  C. Current medications and supplements D. Functional ability and status E.  Nutritional status F.  Physical activity G. Advance directives H. List of other physicians I.  Hospitalizations, surgeries, and ER visits in previous 12 months J.  [place] to include hearing, vision, cognitive, depression L. Referrals and appointments - none  In addition, I have reviewed and discussed with patient certain preventive protocols, quality metrics, and best practice recommendations. A written personalized care plan for preventive  services as well as general preventive health recommendations were provided to patient.  See attached scanned questionnaire for additional information.   Signed,   Lindell Noe, MHA, BS, LPN Health Coach

## 2018-11-14 NOTE — Patient Instructions (Signed)
Susan Duffy , Thank you for taking time to come for your Medicare Wellness Visit. I appreciate your ongoing commitment to your health goals. Please review the following plan we discussed and let me know if I can assist you in the future.   These are the goals we discussed: Goals    . Increase physical activity     Starting 11/14/2018, I will continue to walk for 30 minutes 3 days per week.        This is a list of the screening recommended for you and due dates:  Health Maintenance  Topic Date Due  . Tetanus Vaccine  12/06/2019*  . Complete foot exam   05/13/2019  . Hemoglobin A1C  05/16/2019  . Eye exam for diabetics  08/10/2019  . Mammogram  09/05/2019  . Colon Cancer Screening  03/09/2023  . Flu Shot  Completed  . DEXA scan (bone density measurement)  Completed  .  Hepatitis C: One time screening is recommended by Center for Disease Control  (CDC) for  adults born from [birth year] through 1967.   Completed  . Pneumonia vaccines  Completed  *Topic was postponed. The date shown is not the original due date.   Preventive Care for Adults  A healthy lifestyle and preventive care can promote health and wellness. Preventive health guidelines for adults include the following key practices.  . A routine yearly physical is a good way to check with your health care provider about your health and preventive screening. It is a chance to share any concerns and updates on your health and to receive a thorough exam.  . Visit your dentist for a routine exam and preventive care every 6 months. Brush your teeth twice a day and floss once a day. Good oral hygiene prevents tooth decay and gum disease.  . The frequency of eye exams is based on your age, health, family medical history, use  of contact lenses, and other factors. Follow your health care provider's recommendations for frequency of eye exams.  . Eat a healthy diet. Foods like vegetables, fruits, whole grains, low-fat dairy products, and  lean protein foods contain the nutrients you need without too many calories. Decrease your intake of foods high in solid fats, added sugars, and salt. Eat the right amount of calories for you. Get information about a proper diet from your health care provider, if necessary.  . Regular physical exercise is one of the most important things you can do for your health. Most adults should get at least 150 minutes of moderate-intensity exercise (any activity that increases your heart rate and causes you to sweat) each week. In addition, most adults need muscle-strengthening exercises on 2 or more days a week.  Silver Sneakers may be a benefit available to you. To determine eligibility, you may visit the website: www.silversneakers.com or contact program at [phone] Mon-Fri between 8AM-8PM.   . Maintain a healthy weight. The body mass index (BMI) is a screening tool to identify possible weight problems. It provides an estimate of body fat based on height and weight. Your health care provider can find your BMI and can help you achieve or maintain a healthy weight.   For adults 20 years and older: ? A BMI below 18.5 is considered underweight. ? A BMI of 18.5 to 24.9 is normal. ? A BMI of 25 to 29.9 is considered overweight. ? A BMI of 30 and above is considered obese.   . Maintain normal blood lipids and cholesterol levels by  exercising and minimizing your intake of saturated fat. Eat a balanced diet with plenty of fruit and vegetables. Blood tests for lipids and cholesterol should begin at age 25 and be repeated every 5 years. If your lipid or cholesterol levels are high, you are over 50, or you are at high risk for heart disease, you may need your cholesterol levels checked more frequently. Ongoing high lipid and cholesterol levels should be treated with medicines if diet and exercise are not working.  . If you smoke, find out from your health care provider how to quit. If you do not use tobacco,  please do not start.  . If you choose to drink alcohol, please do not consume more than 2 drinks per day. One drink is considered to be 12 ounces (355 mL) of beer, 5 ounces (148 mL) of wine, or 1.5 ounces (44 mL) of liquor.  . If you are 72-47 years old, ask your health care provider if you should take aspirin to prevent strokes.  . Use sunscreen. Apply sunscreen liberally and repeatedly throughout the day. You should seek shade when your shadow is shorter than you. Protect yourself by wearing long sleeves, pants, a wide-brimmed hat, and sunglasses year round, whenever you are outdoors.  . Once a month, do a whole body skin exam, using a mirror to look at the skin on your back. Tell your health care provider of new moles, moles that have irregular borders, moles that are larger than a pencil eraser, or moles that have changed in shape or color.

## 2018-11-28 ENCOUNTER — Encounter: Payer: Self-pay | Admitting: Family Medicine

## 2018-11-28 ENCOUNTER — Ambulatory Visit (INDEPENDENT_AMBULATORY_CARE_PROVIDER_SITE_OTHER): Payer: Medicare Other | Admitting: Family Medicine

## 2018-11-28 VITALS — BP 126/80 | HR 87 | Temp 97.6°F | Ht 63.0 in | Wt 208.0 lb

## 2018-11-28 DIAGNOSIS — E7800 Pure hypercholesterolemia, unspecified: Secondary | ICD-10-CM

## 2018-11-28 DIAGNOSIS — C187 Malignant neoplasm of sigmoid colon: Secondary | ICD-10-CM

## 2018-11-28 DIAGNOSIS — E119 Type 2 diabetes mellitus without complications: Secondary | ICD-10-CM | POA: Diagnosis not present

## 2018-11-28 DIAGNOSIS — D509 Iron deficiency anemia, unspecified: Secondary | ICD-10-CM | POA: Diagnosis not present

## 2018-11-28 DIAGNOSIS — I10 Essential (primary) hypertension: Secondary | ICD-10-CM

## 2018-11-28 DIAGNOSIS — Z86000 Personal history of in-situ neoplasm of breast: Secondary | ICD-10-CM | POA: Diagnosis not present

## 2018-11-28 LAB — HM DIABETES FOOT EXAM

## 2018-11-28 NOTE — Assessment & Plan Note (Signed)
At goal on statin

## 2018-11-28 NOTE — Progress Notes (Signed)
Subjective:    Patient ID: Susan Duffy, female    DOB: [birth year], 72 y.o.   MRN: [id number]  HPI  The patient presents for complete physical and review of chronic health problems. He/She also has the following acute concerns today:  1 week ago.. when turing in bed.. she had sharp stabbing pain in left chest lasting, followed by soreness at site of port. No swelling, no bruising.  Pain has resolved completely now.  No associated symptoms.  The patient saw Candis Musa, LPN for medicare wellness. Note reviewed in detail and important notes copied below.  Health maintenance:  Tetanus vaccine - postponed/insurance A1C - completed  Abnormal screenings:   None  Patient concerns:   None  Nurse concerns:  None  11/28/18 today:  Multiple primary cancer history:  Ductal carcinoma in situ left breast and  Metastatic colon cancer; Followed by ONc Dr. Rogue Bussing  Last OV 07/2018 reviewed. Port a cath in place and follow up q 6 months.  Elevated Cholesterol:  LDL at goal < 70 given hx of TIA and DM. On statin. Lab Results  Component Value Date   CHOL 135 11/14/2018   HDL 39.40 11/14/2018   LDLCALC 70 05/09/2018   LDLDIRECT 74.0 11/14/2018   TRIG 220.0 (H) 11/14/2018   CHOLHDL 3 11/14/2018  Using medications without problems: Muscle aches:  Diet compliance: Exercise: using cane.Marland Kitchen ankle causing a lot of issues, chronically, OA following fracture. Other complaints:   Wt Readings from Last 3 Encounters:  11/28/18 208 lb (94.3 kg)  11/14/18 203 lb (92.1 kg)  08/02/18 205 lb 14.4 oz (93.4 kg)  Body mass index is 36.85 kg/m.  Hypertension:    Good control on losartan BP Readings from Last 3 Encounters:  11/28/18 126/80  11/14/18 130/86  08/02/18 (!) 141/86  Using medication without problems or lightheadedness: none Chest pain with exertion:none Edema: stable swelling in left ankle Short of breath: stable Average home BPs: Other  issues:  Diabetes:  Well controlled with diet.  Lab Results  Component Value Date   HGBA1C 5.8 11/14/2018  Using medications without difficulties: Hypoglycemic episodes: Hyperglycemic episodes: Feet problems: Blood Sugars averaging: eye exam within last year:   Social History /Family History/Past Medical History reviewed in detail and updated in EMR if needed. Blood pressure 126/80, pulse 87, temperature 97.6 F (36.4 C), temperature source Oral, height 5\' 3"  (1.6 m), weight 208 lb (94.3 kg), SpO2 94 %.   Review of Systems  Constitutional: Negative for fatigue and fever.  HENT: Negative for congestion.   Eyes: Negative for pain.  Respiratory: Negative for cough and shortness of breath.   Cardiovascular: Positive for chest pain. Negative for palpitations and leg swelling.  Gastrointestinal: Negative for abdominal pain.  Genitourinary: Negative for dysuria and vaginal bleeding.  Musculoskeletal: Negative for back pain.  Neurological: Negative for syncope, light-headedness and headaches.  Psychiatric/Behavioral: Negative for dysphoric mood.       Objective:   Physical Exam Constitutional:      General: She is not in acute distress.    Appearance: Normal appearance. She is well-developed. She is not ill-appearing or toxic-appearing.  HENT:     Head: Normocephalic.     Right Ear: Hearing, tympanic membrane, ear canal and external ear normal. Tympanic membrane is not erythematous, retracted or bulging.     Left Ear: Hearing, tympanic membrane, ear canal and external ear normal. Tympanic membrane is not erythematous, retracted or bulging.     Nose: No mucosal edema or  rhinorrhea.     Right Sinus: No maxillary sinus tenderness or frontal sinus tenderness.     Left Sinus: No maxillary sinus tenderness or frontal sinus tenderness.     Mouth/Throat:     Pharynx: Uvula midline.  Eyes:     General: Lids are normal. Lids are everted, no foreign bodies appreciated.      Conjunctiva/sclera: Conjunctivae normal.     Pupils: Pupils are equal, round, and reactive to light.  Neck:     Musculoskeletal: Normal range of motion and neck supple.     Thyroid: No thyroid mass or thyromegaly.     Vascular: No carotid bruit.     Trachea: Trachea normal.  Cardiovascular:     Rate and Rhythm: Normal rate and regular rhythm.     Pulses: Normal pulses.     Heart sounds: Normal heart sounds, S1 normal and S2 normal. No murmur. No friction rub. No gallop.      Comments: No chest wall tenderness and port in place. Pulmonary:     Effort: Pulmonary effort is normal. No tachypnea or respiratory distress.     Breath sounds: Normal breath sounds. No decreased breath sounds, wheezing, rhonchi or rales.  Abdominal:     General: Bowel sounds are normal.     Palpations: Abdomen is soft.     Tenderness: There is no abdominal tenderness.  Skin:    General: Skin is warm and dry.     Findings: No rash.     Comments: Left ankle swelling and chronic changes  varicosities  Neurological:     Mental Status: She is alert.  Psychiatric:        Mood and Affect: Mood is not anxious or depressed.        Speech: Speech normal.        Behavior: Behavior normal. Behavior is cooperative.        Thought Content: Thought content normal.        Judgment: Judgment normal.      Diabetic foot exam: Normal inspection No skin breakdown No calluses  Normal DP pulses Decreased sensation to light touch and monofilament Nails normal      Assessment & Plan:  The patient's preventative maintenance and recommended screening tests for an annual wellness exam were reviewed in full today. Brought up to date unless services declined.  Counselled on the importance of diet, exercise, and its role in overall health and mortality. The patient's FH and SH was reviewed, including their home life, tobacco status, and drug and alcohol status.   Vaccines: Uptodate except, considering  shingles/Td. Mammo: Hx of breast cancer.Stable 08/2018 Bone density:02/2013 osteopenia, repeat in 5 years Will plan 2020 Colon: history of stage 4 colon cancer... Per GI Dr. Collene Mares, colonoscopy 03/2018  Repeat in 3-5 years.Marland Kitchen PAP/DVE: DVE, pap not indicated, no symptoms.  Hep C completed Nonsmoker. Reviewed advanced directives.

## 2018-11-28 NOTE — Assessment & Plan Note (Signed)
Diet controlled. Encouraged exercise, weight loss, healthy eating habits.

## 2018-11-28 NOTE — Patient Instructions (Addendum)
Work on healthy eating and regular walking as tolerated.  Have a great holiday!

## 2018-11-28 NOTE — Assessment & Plan Note (Signed)
Followed by ONC.

## 2018-11-28 NOTE — Assessment & Plan Note (Signed)
Followed by oncology Dr. Rogue Bussing

## 2018-11-30 ENCOUNTER — Other Ambulatory Visit: Payer: Self-pay

## 2018-11-30 DIAGNOSIS — Z86000 Personal history of in-situ neoplasm of breast: Secondary | ICD-10-CM

## 2018-12-04 ENCOUNTER — Inpatient Hospital Stay: Payer: Medicare Other | Attending: Internal Medicine

## 2018-12-04 DIAGNOSIS — Z452 Encounter for adjustment and management of vascular access device: Secondary | ICD-10-CM | POA: Diagnosis not present

## 2018-12-04 DIAGNOSIS — Z95828 Presence of other vascular implants and grafts: Secondary | ICD-10-CM

## 2018-12-04 DIAGNOSIS — Z85038 Personal history of other malignant neoplasm of large intestine: Secondary | ICD-10-CM | POA: Diagnosis not present

## 2018-12-04 MED ORDER — SODIUM CHLORIDE 0.9% FLUSH
10.0000 mL | INTRAVENOUS | Status: DC | PRN
  Administered 2018-12-04: 10 mL via INTRAVENOUS
  Filled 2018-12-04: qty 10

## 2018-12-04 MED ORDER — HEPARIN SOD (PORK) LOCK FLUSH 100 UNIT/ML IV SOLN
500.0000 [IU] | Freq: Once | INTRAVENOUS | Status: AC
  Administered 2018-12-04: 500 [IU] via INTRAVENOUS
  Filled 2018-12-04: qty 5

## 2019-01-31 ENCOUNTER — Inpatient Hospital Stay: Payer: Medicare Other | Attending: Internal Medicine

## 2019-01-31 ENCOUNTER — Inpatient Hospital Stay (HOSPITAL_BASED_OUTPATIENT_CLINIC_OR_DEPARTMENT_OTHER): Payer: Medicare Other | Admitting: Internal Medicine

## 2019-01-31 VITALS — BP 130/76 | HR 69 | Temp 97.2°F | Resp 16 | Wt 205.0 lb

## 2019-01-31 DIAGNOSIS — Z86000 Personal history of in-situ neoplasm of breast: Secondary | ICD-10-CM

## 2019-01-31 DIAGNOSIS — R9389 Abnormal findings on diagnostic imaging of other specified body structures: Secondary | ICD-10-CM | POA: Insufficient documentation

## 2019-01-31 DIAGNOSIS — Z86718 Personal history of other venous thrombosis and embolism: Secondary | ICD-10-CM

## 2019-01-31 DIAGNOSIS — I10 Essential (primary) hypertension: Secondary | ICD-10-CM

## 2019-01-31 DIAGNOSIS — Z803 Family history of malignant neoplasm of breast: Secondary | ICD-10-CM | POA: Insufficient documentation

## 2019-01-31 DIAGNOSIS — Z801 Family history of malignant neoplasm of trachea, bronchus and lung: Secondary | ICD-10-CM | POA: Insufficient documentation

## 2019-01-31 DIAGNOSIS — C187 Malignant neoplasm of sigmoid colon: Secondary | ICD-10-CM

## 2019-01-31 DIAGNOSIS — Z85038 Personal history of other malignant neoplasm of large intestine: Secondary | ICD-10-CM | POA: Diagnosis not present

## 2019-01-31 DIAGNOSIS — Z800 Family history of malignant neoplasm of digestive organs: Secondary | ICD-10-CM | POA: Insufficient documentation

## 2019-01-31 LAB — CBC WITH DIFFERENTIAL/PLATELET
ABS IMMATURE GRANULOCYTES: 0.01 10*3/uL (ref 0.00–0.07)
BASOS PCT: 1 %
Basophils Absolute: 0.1 10*3/uL (ref 0.0–0.1)
EOS ABS: 0.1 10*3/uL (ref 0.0–0.5)
Eosinophils Relative: 2 %
HCT: 43 % (ref 36.0–46.0)
Hemoglobin: 14.4 g/dL (ref 12.0–15.0)
Immature Granulocytes: 0 %
Lymphocytes Relative: 16 %
Lymphs Abs: 1.2 10*3/uL (ref 0.7–4.0)
MCH: 32.4 pg (ref 26.0–34.0)
MCHC: 33.5 g/dL (ref 30.0–36.0)
MCV: 96.6 fL (ref 80.0–100.0)
MONO ABS: 0.7 10*3/uL (ref 0.1–1.0)
MONOS PCT: 9 %
Neutro Abs: 5.5 10*3/uL (ref 1.7–7.7)
Neutrophils Relative %: 72 %
PLATELETS: 233 10*3/uL (ref 150–400)
RBC: 4.45 MIL/uL (ref 3.87–5.11)
RDW: 12.2 % (ref 11.5–15.5)
WBC: 7.6 10*3/uL (ref 4.0–10.5)
nRBC: 0 % (ref 0.0–0.2)

## 2019-01-31 LAB — COMPREHENSIVE METABOLIC PANEL
ALBUMIN: 4 g/dL (ref 3.5–5.0)
ALK PHOS: 95 U/L (ref 38–126)
ALT: 20 U/L (ref 0–44)
AST: 22 U/L (ref 15–41)
Anion gap: 7 (ref 5–15)
BILIRUBIN TOTAL: 0.6 mg/dL (ref 0.3–1.2)
BUN: 22 mg/dL (ref 8–23)
CALCIUM: 9.1 mg/dL (ref 8.9–10.3)
CO2: 25 mmol/L (ref 22–32)
CREATININE: 0.81 mg/dL (ref 0.44–1.00)
Chloride: 107 mmol/L (ref 98–111)
GFR calc Af Amer: >60 mL/min (ref >60)
GLUCOSE: 104 mg/dL — AB (ref 70–99)
POTASSIUM: 3.8 mmol/L (ref 3.5–5.1)
Sodium: 139 mmol/L (ref 135–145)
TOTAL PROTEIN: 8.2 g/dL — AB (ref 6.5–8.1)

## 2019-01-31 MED ORDER — HEPARIN SOD (PORK) LOCK FLUSH 100 UNIT/ML IV SOLN
500.0000 [IU] | Freq: Once | INTRAVENOUS | Status: AC
  Administered 2019-01-31: 500 [IU] via INTRAVENOUS
  Filled 2019-01-31: qty 5

## 2019-01-31 MED ORDER — SODIUM CHLORIDE 0.9% FLUSH
10.0000 mL | Freq: Once | INTRAVENOUS | Status: AC
  Administered 2019-01-31: 10 mL via INTRAVENOUS
  Filled 2019-01-31: qty 10

## 2019-01-31 NOTE — Progress Notes (Signed)
[facility] OFFICE PROGRESS NOTE  Patient Care Team: Jinny Sanders, MD as PCP - General  Cancer Staging No matching staging information was found for the patient.   Oncology History   # 2005- COLON CA [Stage II- left sided; s/p Surgery in [place] [facility] [place]]; no Adj chemo.   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   # Nov 2010-LLE DVT [on Tam] on coumadin; AUG 2017- STOP coumadin  # ; PN-G-1; port flush q 6 w  # Left kidney cyst [Feb 2017]  # colonoscopy- 2019 [Dr.Mann; [place]]  DIAGNOSIS: METASTATIC COLON CA  STAGE: IV; NED        ;GOALS: control  CURRENT/MOST RECENT THERAPY: surveillance      Cancer of sigmoid colon ([place])      INTERVAL HISTORY:  Susan Duffy 73 y.o.  female pleasant patient above history of metastatic colon cancer currently on surveillance is here for follow-up.   Patient denies any aches and pains. denies any bone pain.  Denies any vaginal bleeding/or abdominal discomfort.   Review of Systems  Constitutional: Negative for chills, diaphoresis, fever, malaise/fatigue and weight loss.  HENT: Negative for nosebleeds and sore throat.   Eyes: Negative for double vision.  Respiratory: Negative for cough, hemoptysis, sputum production, shortness of breath and wheezing.   Cardiovascular: Negative for chest pain, palpitations, orthopnea and leg swelling.  Gastrointestinal: Negative for abdominal pain, blood in stool, constipation, diarrhea, heartburn, melena, nausea and vomiting.  Genitourinary: Negative for dysuria, frequency and urgency.  Musculoskeletal: Positive for back pain and joint pain.  Skin: Negative.  Negative for itching and rash.  Neurological: Negative for dizziness,  tingling, focal weakness, weakness and headaches.  Endo/Heme/Allergies: Does not bruise/bleed easily.  Psychiatric/Behavioral: Negative for depression. The patient is not nervous/anxious and does not have insomnia.       PAST MEDICAL HISTORY :  Past Medical History:  Diagnosis Date  . Allergy   . Breast CA ([place])   . Breast cancer ([place]) 2010   in situ and took radiation, no chemo needed  . Colon cancer ([place])    stage 4 colon cancer  . History of chemotherapy 02/2011-08/2011   FOLFOX/AVASTIN- 8 CYCLES  . History of DVT (deep vein thrombosis) 2010   while taking tamoxifen-left lower extremity  . History of peripheral neuropathy   . Hyperlipidemia   . Hypertension   . IDA (iron deficiency anemia)   . Osteoarthritis   . Personal history of chemotherapy    colon ca  . Personal history of radiation therapy    breast and colon  . TIA (transient ischemic attack) 03/08/2011  . Vitamin D deficiency     PAST SURGICAL HISTORY :   Past Surgical History:  Procedure Laterality Date  . BREAST EXCISIONAL BIOPSY Left 1-10   s/p lumpectomy and radiation left dcis  . BREAST EXCISIONAL BIOPSY Left    fibroadeoma reoved age 20  . BREAST LUMPECTOMY  2010   left breast  . broken ankle  Left 08/2012  . COLON SURGERY     colectomy, partial  . LOBECTOMY  06-2009   metastatic colon cancer    FAMILY HISTORY :   Family History  Problem Relation Age of Onset  . Hypertension Mother   .  Stroke Mother   . Coronary artery disease Mother   . Thyroid disease Mother   . Diabetes Father   . Hypertension Father   . Lung cancer Father   . Heart attack Maternal Grandfather   . Breast cancer Maternal Grandmother   . Thyroid disease Brother   . Diabetes Paternal Grandmother   . Colon cancer Paternal Grandfather   . Deep vein thrombosis Unknown     SOCIAL HISTORY:   Social History   Tobacco Use  . Smoking status: Never Smoker  . Smokeless tobacco: Never Used  Substance Use Topics  . Alcohol use:  No  . Drug use: No    ALLERGIES:  is allergic to amoxicillin; erythromycin; and nitrofuran derivatives.  MEDICATIONS:  Current Outpatient Medications  Medication Sig Dispense Refill  . acetaminophen (TYLENOL) 500 MG tablet as needed for moderate pain. Take 1-2 by mouth every 6 hours as needed     . aspirin 81 MG chewable tablet Chew 81 mg by mouth daily.    . cholecalciferol (VITAMIN D) 400 UNITS TABS tablet Take 400 Units by mouth 2 (two) times daily.    . Cyanocobalamin (VITAMIN B-12) 2000 MCG TBCR Take 1 tablet by mouth daily.      Marland Kitchen lidocaine-prilocaine (EMLA) cream Apply 1 application topically as needed. To port a cath site approximately 1 to 2 hours prior to port needle insertion. 30 g 12  . losartan (COZAAR) 50 MG tablet Take 1 tablet (50 mg total) by mouth daily. 90 tablet 3  . simvastatin (ZOCOR) 40 MG tablet TAKE 1 TABLET (40 MG TOTAL) BY MOUTH AT BEDTIME. 90 tablet 3   No current facility-administered medications for this visit.    Facility-Administered Medications Ordered in Other Visits  Medication Dose Route Frequency Provider Last Rate Last Dose  . sodium chloride flush (NS) 0.9 % injection 10 mL  10 mL Intravenous PRN Cammie Sickle, MD   10 mL at 01/05/16 1135    PHYSICAL EXAMINATION: ECOG PERFORMANCE STATUS: 0 - Asymptomatic  BP 130/76 (BP Location: Left Arm, Patient Position: Sitting, Cuff Size: Normal)   Pulse 69   Temp (!) 97.2 F (36.2 C) (Tympanic)   Resp 16   Wt 205 lb (93 kg)   BMI 36.31 kg/m   Filed Weights   01/31/19 1339  Weight: 205 lb (93 kg)   Physical Exam  Constitutional: She is oriented to person, place, and time and well-developed, well-nourished, and in no distress.  Obese.  Walks by herself.  She is alone.  HENT:  Head: Normocephalic and atraumatic.  Mouth/Throat: Oropharynx is clear and moist. No oropharyngeal exudate.  Eyes: Pupils are equal, round, and reactive to light.  Neck: Normal range of motion. Neck supple.   Cardiovascular: Normal rate and regular rhythm.  Pulmonary/Chest: No respiratory distress. She has no wheezes.  Abdominal: Soft. Bowel sounds are normal. She exhibits no distension and no mass. There is no abdominal tenderness. There is no rebound and no guarding.  Musculoskeletal: Normal range of motion.        General: No tenderness or edema.  Neurological: She is alert and oriented to person, place, and time.  Skin: Skin is warm.  Psychiatric: Affect normal.    SKIN:  no rashes or significant lesions    LABORATORY DATA:  I have reviewed the data as listed    Component Value Date/Time   NA 139 01/31/2019 1259   NA 141 12/20/2013 0901   K 3.8 01/31/2019 1259  K 4.1 12/20/2013 0901   CL 107 01/31/2019 1259   CL 106 12/18/2012 0842   CO2 25 01/31/2019 1259   CO2 27 12/20/2013 0901   GLUCOSE 104 (H) 01/31/2019 1259   GLUCOSE 103 12/20/2013 0901   GLUCOSE 109 (H) 12/18/2012 0842   BUN 22 01/31/2019 1259   BUN 23.2 12/20/2013 0901   CREATININE 0.81 01/31/2019 1259   CREATININE 1.04 12/31/2014 1023   CREATININE 1.0 12/20/2013 0901   CALCIUM 9.1 01/31/2019 1259   CALCIUM 10.1 12/20/2013 0901   PROT 8.2 (H) 01/31/2019 1259   PROT 7.9 12/31/2014 1023   PROT 8.5 (H) 12/20/2013 0901   ALBUMIN 4.0 01/31/2019 1259   ALBUMIN 3.3 (L) 12/31/2014 1023   ALBUMIN 3.7 12/20/2013 0901   AST 22 01/31/2019 1259   AST 24 12/31/2014 1023   AST 21 12/20/2013 0901   ALT 20 01/31/2019 1259   ALT 28 12/31/2014 1023   ALT 18 12/20/2013 0901   ALKPHOS 95 01/31/2019 1259   ALKPHOS 135 (H) 12/31/2014 1023   ALKPHOS 144 12/20/2013 0901   BILITOT 0.6 01/31/2019 1259   BILITOT 0.4 12/31/2014 1023   BILITOT 0.60 12/20/2013 0901   GFRNONAA >60 01/31/2019 1259   GFRNONAA 56 (L) 12/31/2014 1023   GFRNONAA 57 (L) 06/24/2014 1300   GFRAA >60 01/31/2019 1259   GFRAA >60 12/31/2014 1023   GFRAA >60 06/24/2014 1300    No results found for: SPEP, UPEP  Lab Results  Component Value Date   WBC  7.6 01/31/2019   NEUTROABS 5.5 01/31/2019   HGB 14.4 01/31/2019   HCT 43.0 01/31/2019   MCV 96.6 01/31/2019   PLT 233 01/31/2019      Chemistry      Component Value Date/Time   NA 139 01/31/2019 1259   NA 141 12/20/2013 0901   K 3.8 01/31/2019 1259   K 4.1 12/20/2013 0901   CL 107 01/31/2019 1259   CL 106 12/18/2012 0842   CO2 25 01/31/2019 1259   CO2 27 12/20/2013 0901   BUN 22 01/31/2019 1259   BUN 23.2 12/20/2013 0901   CREATININE 0.81 01/31/2019 1259   CREATININE 1.04 12/31/2014 1023   CREATININE 1.0 12/20/2013 0901      Component Value Date/Time   CALCIUM 9.1 01/31/2019 1259   CALCIUM 10.1 12/20/2013 0901   ALKPHOS 95 01/31/2019 1259   ALKPHOS 135 (H) 12/31/2014 1023   ALKPHOS 144 12/20/2013 0901   AST 22 01/31/2019 1259   AST 24 12/31/2014 1023   AST 21 12/20/2013 0901   ALT 20 01/31/2019 1259   ALT 28 12/31/2014 1023   ALT 18 12/20/2013 0901   BILITOT 0.6 01/31/2019 1259   BILITOT 0.4 12/31/2014 1023   BILITOT 0.60 12/20/2013 0901       RADIOGRAPHIC STUDIES: I have personally reviewed the radiological images as listed and agreed with the findings in the report. No results found.   ASSESSMENT & PLAN:  Cancer of sigmoid colon [facility]) # Metastatic colon cancer chest wall lung recurrence/ status post lobectomy- 2010. AUG 2019- NED; fibroid uterus; endometrial thickening [see discussion below]. Colo- 2019- wnl. STABLE.   # Clinically no evidence of recurrence. CEA pending today.   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  NOV 2019- mammogram- WNL. STABLE>   # elevated Blood pressure- 140s-STABLE>   #Endometrial thickening-no discharge no bleeding; incidental on CT scan; Sep 2019. Recommend pelvic US.   # DISPOSITION: # pelvic US in 1 week- # continue port  flush every 2 months # Follow-up with in  6 months-MD/port flush; CBC CMP CEA- Dr.B.      Orders Placed This Encounter  Procedures  . US PELVIC COMPLETE WITH TRANSVAGINAL    Standing  Status:   Future    Standing Expiration Date:   03/31/2020    Order Specific Question:   Reason for Exam (SYMPTOM  OR DIAGNOSIS REQUIRED)    Answer:   endometrial thickening    Order Specific Question:   Preferred imaging location?    Answer:   [facility]   All questions were answered. The patient knows to call the clinic with any problems, questions or concerns.      Cammie Sickle, MD 01/31/2019 2:10 PM

## 2019-01-31 NOTE — Assessment & Plan Note (Addendum)
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 AUG 2019- NED; fibroid uterus; endometrial thickening [see discussion below]. Colo- 2019- wnl. STABLE.   # Clinically no evidence of recurrence. CEA pending today.  Will hold of imaging for now.   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  NOV 2019- mammogram- WNL. STABLE>   # elevated Blood pressure- 140s-STABLE>   #Endometrial thickening-no discharge no bleeding; incidental on CT scan; Sep 2019. Recommend pelvic US.   # DISPOSITION: # pelvic US in 1 week- # continue port flush every 2 months # Follow-up with in  6 months-MD/port flush; CBC CMP CEA- Dr.B.

## 2019-02-01 LAB — CEA: CEA1: 1.4 ng/mL (ref 0.0–4.7)

## 2019-02-07 ENCOUNTER — Ambulatory Visit
Admission: RE | Admit: 2019-02-07 | Discharge: 2019-02-07 | Disposition: A | Discharge status: Home / Self Care | Payer: Medicare Other | Source: Ambulatory Visit | Attending: Internal Medicine | Admitting: Internal Medicine

## 2019-02-07 ENCOUNTER — Other Ambulatory Visit: Payer: Self-pay

## 2019-02-07 DIAGNOSIS — D259 Leiomyoma of uterus, unspecified: Secondary | ICD-10-CM | POA: Diagnosis not present

## 2019-02-07 DIAGNOSIS — R9389 Abnormal findings on diagnostic imaging of other specified body structures: Secondary | ICD-10-CM | POA: Insufficient documentation

## 2019-02-13 ENCOUNTER — Encounter: Payer: Self-pay | Admitting: Internal Medicine

## 2019-02-13 NOTE — Telephone Encounter (Signed)
Left VM to discuss the results of the pelvic US.  I would recommend evaluation with gynecologist for biopsy of endometrium.   I sent mychart message to ot.

## 2019-02-22 ENCOUNTER — Telehealth: Payer: Self-pay | Admitting: Internal Medicine

## 2019-02-22 NOTE — Telephone Encounter (Signed)
Susan Duffy- I left message for this pt re: endometrial thickening/ needing further evaluation;   Please follow up- she can go to her gyn or see Lauren/ gyn-onc clinic.   Thx GB

## 2019-02-22 NOTE — Telephone Encounter (Signed)
I have on my list to discuss with Dr. Fransisca Connors to see if he'd like to do biopsy in gyn-onc clinic or have me refer her to gyn as well as timing giving current covid-19 guidelines. I will let you both know what I hear.

## 2019-02-23 ENCOUNTER — Telehealth: Payer: Self-pay | Admitting: Nurse Practitioner

## 2019-02-23 ENCOUNTER — Other Ambulatory Visit: Payer: Self-pay | Admitting: Nurse Practitioner

## 2019-02-23 DIAGNOSIS — R9389 Abnormal findings on diagnostic imaging of other specified body structures: Secondary | ICD-10-CM

## 2019-02-23 NOTE — Telephone Encounter (Signed)
Called patient regarding endometrial thickening that was previously seen on CT and this was followed up with pelvic ultrasound which measured endometrium at 46mm. I discussed with Dr. Fransisca Connors who advises to follow up with patient regarding symptoms. He advises that in post menopausal women, endometrial thickening is defined as > 10 mm.  This was discussed with Dr. Rogue Bussing who recommends that if asymptomatic, repeat pelvic ultrasound prior to her next visit with him. I spoke to patient and she says that she is having no bleeding, discomfort, or discharge.  Denies spotting.  I discussed recommendation from Dr. Fransisca Connors and Dr. Rogue Bussing to continue surveillance and repeat pelvic ultrasound in August then follow-up with Dr. Rogue Bussing. If she becomes symptomatic I advised her to call the clinic and we would see her for endometrial biopsy. She is agreeable with this plan and thanks me for calling.  I will place order for pelvic u/s on behalf of Dr. Rogue Bussing and have scheduling call patient with date/time.

## 2019-02-23 NOTE — Progress Notes (Signed)
US Pelvic Complete with Transvaginal for postmenopausal endometrial thickening in setting of metastatic colon cancer.

## 2019-05-01 ENCOUNTER — Telehealth: Payer: Self-pay | Admitting: Family Medicine

## 2019-05-01 NOTE — Telephone Encounter (Signed)
Left message asking pt to call office please r/s 6/23 appointment dr Diona Browner out of office

## 2019-05-15 ENCOUNTER — Other Ambulatory Visit: Payer: Self-pay | Admitting: Family Medicine

## 2019-05-22 ENCOUNTER — Other Ambulatory Visit: Payer: Self-pay

## 2019-05-22 ENCOUNTER — Other Ambulatory Visit (INDEPENDENT_AMBULATORY_CARE_PROVIDER_SITE_OTHER): Payer: Medicare Other

## 2019-05-22 ENCOUNTER — Telehealth: Payer: Self-pay | Admitting: Family Medicine

## 2019-05-22 DIAGNOSIS — E119 Type 2 diabetes mellitus without complications: Secondary | ICD-10-CM

## 2019-05-22 LAB — COMPREHENSIVE METABOLIC PANEL
ALT: 19 U/L (ref 0–35)
AST: 19 U/L (ref 0–37)
Albumin: 4 g/dL (ref 3.5–5.2)
Alkaline Phosphatase: 87 U/L (ref 39–117)
BUN: 16 mg/dL (ref 6–23)
CO2: 23 mEq/L (ref 19–32)
Calcium: 9.4 mg/dL (ref 8.4–10.5)
Chloride: 107 mEq/L (ref 96–112)
Creatinine, Ser: 0.79 mg/dL (ref 0.40–1.20)
GFR: 70.93 mL/min (ref >60.00)
Glucose, Bld: 114 mg/dL — ABNORMAL HIGH (ref 70–99)
Potassium: 3.8 mEq/L (ref 3.5–5.1)
Sodium: 139 mEq/L (ref 135–145)
Total Bilirubin: 0.6 mg/dL (ref 0.2–1.2)
Total Protein: 7.4 g/dL (ref 6.0–8.3)

## 2019-05-22 LAB — LIPID PANEL
Cholesterol: 131 mg/dL (ref 0–200)
HDL: 39.7 mg/dL (ref >39.00)
LDL Cholesterol: 72 mg/dL (ref 0–99)
NonHDL: 90.9
Total CHOL/HDL Ratio: 3
Triglycerides: 94 mg/dL (ref 0.0–149.0)
VLDL: 18.8 mg/dL (ref 0.0–40.0)

## 2019-05-22 LAB — HEMOGLOBIN A1C: Hgb A1c MFr Bld: 5.9 % (ref 4.6–6.5)

## 2019-05-22 NOTE — Telephone Encounter (Signed)
-----   Message from Cloyd Stagers, RT sent at 05/16/2019  3:24 PM EDT ----- Regarding: Lab Orders for Tuesday 6.16.2020 Please place lab orders for Tuesday 6.16.2020 for 11mo DM, Office visit Friday 6.19.2020 Thank you, Dyke Maes RT(R)

## 2019-05-25 ENCOUNTER — Encounter: Payer: Self-pay | Admitting: Family Medicine

## 2019-05-25 ENCOUNTER — Ambulatory Visit (INDEPENDENT_AMBULATORY_CARE_PROVIDER_SITE_OTHER): Payer: Medicare Other | Admitting: Family Medicine

## 2019-05-25 VITALS — BP 124/78 | Temp 97.0°F | Ht 63.0 in | Wt 201.5 lb

## 2019-05-25 DIAGNOSIS — I10 Essential (primary) hypertension: Secondary | ICD-10-CM | POA: Diagnosis not present

## 2019-05-25 DIAGNOSIS — E119 Type 2 diabetes mellitus without complications: Secondary | ICD-10-CM | POA: Diagnosis not present

## 2019-05-25 DIAGNOSIS — E7800 Pure hypercholesterolemia, unspecified: Secondary | ICD-10-CM | POA: Diagnosis not present

## 2019-05-25 NOTE — Progress Notes (Signed)
VIRTUAL VISIT Due to national recommendations of social distancing due to [place] 19, a virtual visit is felt to be most appropriate for this patient at this time.   I connected with the patient on 05/25/19 at  9:20 AM EDT by virtual telehealth platform and verified that I am speaking with the correct person using two identifiers.   I discussed the limitations, risks, security and privacy concerns of performing an evaluation and management service by  virtual telehealth platform and the availability of in person appointments. I also discussed with the patient that there may be a patient responsible charge related to this service. The patient expressed understanding and agreed to proceed.  Patient location: Home Provider Location: [facility] Participants: Susan Duffy and [name]   Chief Complaint  Patient presents with  . Diabetes    History of Present Illness: 73 year old female presents for 6 month follow up DM.  Diabetes:  Well controlled on no medication. Lab Results  Component Value Date   HGBA1C 5.9 05/22/2019  Using medications without difficulties: Hypoglycemic episodes: Hyperglycemic episodes: Feet problems: no ulcer Blood Sugars averaging: not checking eye exam within last year: yes  Elevated Cholesterol: LDL almost at goal <70 on statin .Hx of TIA Lab Results  Component Value Date   CHOL 131 05/22/2019   HDL 39.70 05/22/2019   LDLCALC 72 05/22/2019   LDLDIRECT 74.0 11/14/2018   TRIG 94.0 05/22/2019   CHOLHDL 3 05/22/2019  Using medications without problems: Muscle aches:  Diet compliance: moderate diet Exercise: occ walking Other complaints:  Hypertension: Well controlled on losaratn BP Readings from Last 3 Encounters:  05/25/19 124/78  01/31/19 130/76  11/28/18 126/80  Using medication without problems or lightheadedness: none Chest pain with exertion:none Edema:none Short of breath:none Average home BPs: Other  issues:   COVID 19 screen No recent travel or known exposure to [place] The patient denies respiratory symptoms of COVID 19 at this time.  The importance of social distancing was discussed today.   ROS    Past Medical History:  Diagnosis Date  . Allergy   . Breast CA ([place])   . Breast cancer ([place]) 2010   in situ and took radiation, no chemo needed  . Colon cancer ([place])    stage 4 colon cancer  . History of chemotherapy 02/2011-08/2011   FOLFOX/AVASTIN- 8 CYCLES  . History of DVT (deep vein thrombosis) 2010   while taking tamoxifen-left lower extremity  . History of peripheral neuropathy   . Hyperlipidemia   . Hypertension   . IDA (iron deficiency anemia)   . Osteoarthritis   . Personal history of chemotherapy    colon ca  . Personal history of radiation therapy    breast and colon  . TIA (transient ischemic attack) 03/08/2011  . Vitamin D deficiency     reports that she has never smoked. She has never used smokeless tobacco. She reports that she does not drink alcohol or use drugs.   Current Outpatient Medications:  .  acetaminophen (TYLENOL) 500 MG tablet, as needed for moderate pain. Take 1-2 by mouth every 6 hours as needed , Disp: , Rfl:  .  aspirin 81 MG chewable tablet, Chew 81 mg by mouth daily., Disp: , Rfl:  .  cholecalciferol (VITAMIN D) 400 UNITS TABS tablet, Take 400 Units by mouth 2 (two) times daily., Disp: , Rfl:  .  Cyanocobalamin (VITAMIN B-12) 2000 MCG TBCR, Take 1 tablet by mouth daily.  , Disp: ,  Rfl:  .  lidocaine-prilocaine (EMLA) cream, Apply 1 application topically as needed. To port a cath site approximately 1 to 2 hours prior to port needle insertion., Disp: 30 g, Rfl: 12 .  losartan (COZAAR) 50 MG tablet, Take 1 tablet (50 mg total) by mouth daily., Disp: 90 tablet, Rfl: 3 .  simvastatin (ZOCOR) 40 MG tablet, TAKE 1 TABLET (40 MG TOTAL) BY MOUTH AT BEDTIME., Disp: 90 tablet, Rfl: 1 No current facility-administered medications for this visit.    Facility-Administered Medications Ordered in Other Visits:  .  sodium chloride flush (NS) 0.9 % injection 10 mL, 10 mL, Intravenous, PRN, Cammie Sickle, MD, 10 mL at 01/05/16 1135   Observations/Objective: Blood pressure 124/78, temperature (!) 97 F (36.1 C), temperature source Oral, height 5\' 3"  (1.6 m), weight 201 lb 8 oz (91.4 kg).  Physical Exam  Physical Exam Constitutional:      General: The patient is not in acute distress. Pulmonary:     Effort: Pulmonary effort is normal. No respiratory distress.  Neurological:     Mental Status: The patient is alert and oriented to person, place, and time.  Psychiatric:        Mood and Affect: Mood normal.        Behavior: Behavior normal.   Assessment and Plan Type II diabetes mellitus, well controlled Well controlled on no medication. Encouraged exercise, weight loss, healthy eating habits.   Severe obesity (BMI 35.0-39.9) with comorbidity ([place]) Counseled in detail of healthy lifestyle and weight management options.  HYPERCHOLESTEROLEMIA LDL almost at goal <70 on statin.Hx of TIA   Essential hypertension, benign Well controlled. Continue current medication.      I discussed the assessment and treatment plan with the patient. The patient was provided an opportunity to ask questions and all were answered. The patient agreed with the plan and demonstrated an understanding of the instructions.   The patient was advised to call back or seek an in-person evaluation if the symptoms worsen or if the condition fails to improve as anticipated.     Susan Lofts, MD

## 2019-05-25 NOTE — Assessment & Plan Note (Signed)
Counseled in detail of healthy lifestyle and weight management options.

## 2019-05-25 NOTE — Assessment & Plan Note (Signed)
Well controlled. Continue current medication.  

## 2019-05-25 NOTE — Assessment & Plan Note (Signed)
LDL almost at goal <70 on statin.Hx of TIA

## 2019-05-25 NOTE — Assessment & Plan Note (Signed)
Well controlled on no medication. Encouraged exercise, weight loss, healthy eating habits.

## 2019-05-29 ENCOUNTER — Ambulatory Visit: Payer: Medicare Other | Admitting: Family Medicine

## 2019-06-01 ENCOUNTER — Inpatient Hospital Stay: Payer: Medicare Other | Attending: Internal Medicine

## 2019-06-01 ENCOUNTER — Other Ambulatory Visit: Payer: Self-pay

## 2019-06-01 DIAGNOSIS — Z95828 Presence of other vascular implants and grafts: Secondary | ICD-10-CM

## 2019-06-01 DIAGNOSIS — Z85038 Personal history of other malignant neoplasm of large intestine: Secondary | ICD-10-CM | POA: Insufficient documentation

## 2019-06-01 DIAGNOSIS — Z86000 Personal history of in-situ neoplasm of breast: Secondary | ICD-10-CM | POA: Diagnosis not present

## 2019-06-01 DIAGNOSIS — Z452 Encounter for adjustment and management of vascular access device: Secondary | ICD-10-CM | POA: Insufficient documentation

## 2019-06-01 MED ORDER — HEPARIN SOD (PORK) LOCK FLUSH 100 UNIT/ML IV SOLN
500.0000 [IU] | Freq: Once | INTRAVENOUS | Status: AC
  Administered 2019-06-01: 500 [IU] via INTRAVENOUS

## 2019-06-01 MED ORDER — SODIUM CHLORIDE 0.9% FLUSH
10.0000 mL | Freq: Once | INTRAVENOUS | Status: AC
  Administered 2019-06-01: 10 mL via INTRAVENOUS
  Filled 2019-06-01: qty 10

## 2019-07-26 ENCOUNTER — Other Ambulatory Visit: Payer: Self-pay | Admitting: Family Medicine

## 2019-07-26 DIAGNOSIS — Z1231 Encounter for screening mammogram for malignant neoplasm of breast: Secondary | ICD-10-CM

## 2019-07-27 ENCOUNTER — Ambulatory Visit
Admission: RE | Admit: 2019-07-27 | Discharge: 2019-07-27 | Disposition: A | Discharge status: Home / Self Care | Payer: Medicare Other | Source: Ambulatory Visit | Attending: Nurse Practitioner | Admitting: Nurse Practitioner

## 2019-07-27 ENCOUNTER — Other Ambulatory Visit: Payer: Self-pay

## 2019-07-27 DIAGNOSIS — D250 Submucous leiomyoma of uterus: Secondary | ICD-10-CM | POA: Diagnosis not present

## 2019-07-27 DIAGNOSIS — C785 Secondary malignant neoplasm of large intestine and rectum: Secondary | ICD-10-CM | POA: Diagnosis not present

## 2019-07-27 DIAGNOSIS — N83202 Unspecified ovarian cyst, left side: Secondary | ICD-10-CM | POA: Diagnosis not present

## 2019-07-27 DIAGNOSIS — C801 Malignant (primary) neoplasm, unspecified: Secondary | ICD-10-CM | POA: Diagnosis not present

## 2019-07-27 DIAGNOSIS — R9389 Abnormal findings on diagnostic imaging of other specified body structures: Secondary | ICD-10-CM

## 2019-07-27 DIAGNOSIS — N83201 Unspecified ovarian cyst, right side: Secondary | ICD-10-CM | POA: Diagnosis not present

## 2019-07-31 ENCOUNTER — Other Ambulatory Visit: Payer: Self-pay

## 2019-08-01 ENCOUNTER — Other Ambulatory Visit: Payer: Self-pay | Admitting: *Deleted

## 2019-08-01 ENCOUNTER — Encounter: Payer: Self-pay | Admitting: Internal Medicine

## 2019-08-01 ENCOUNTER — Inpatient Hospital Stay: Payer: Medicare Other | Attending: Internal Medicine

## 2019-08-01 ENCOUNTER — Inpatient Hospital Stay (HOSPITAL_BASED_OUTPATIENT_CLINIC_OR_DEPARTMENT_OTHER): Payer: Medicare Other | Admitting: Internal Medicine

## 2019-08-01 ENCOUNTER — Encounter: Payer: Self-pay | Admitting: Obstetrics and Gynecology

## 2019-08-01 ENCOUNTER — Other Ambulatory Visit: Payer: Self-pay

## 2019-08-01 ENCOUNTER — Inpatient Hospital Stay (HOSPITAL_BASED_OUTPATIENT_CLINIC_OR_DEPARTMENT_OTHER): Payer: Medicare Other | Admitting: Obstetrics and Gynecology

## 2019-08-01 DIAGNOSIS — M1990 Unspecified osteoarthritis, unspecified site: Secondary | ICD-10-CM | POA: Diagnosis not present

## 2019-08-01 DIAGNOSIS — C187 Malignant neoplasm of sigmoid colon: Secondary | ICD-10-CM

## 2019-08-01 DIAGNOSIS — Z95828 Presence of other vascular implants and grafts: Secondary | ICD-10-CM

## 2019-08-01 DIAGNOSIS — Z86718 Personal history of other venous thrombosis and embolism: Secondary | ICD-10-CM | POA: Insufficient documentation

## 2019-08-01 DIAGNOSIS — Z79899 Other long term (current) drug therapy: Secondary | ICD-10-CM | POA: Diagnosis not present

## 2019-08-01 DIAGNOSIS — M8580 Other specified disorders of bone density and structure, unspecified site: Secondary | ICD-10-CM | POA: Insufficient documentation

## 2019-08-01 DIAGNOSIS — Z853 Personal history of malignant neoplasm of breast: Secondary | ICD-10-CM | POA: Insufficient documentation

## 2019-08-01 DIAGNOSIS — I10 Essential (primary) hypertension: Secondary | ICD-10-CM | POA: Insufficient documentation

## 2019-08-01 DIAGNOSIS — Z7982 Long term (current) use of aspirin: Secondary | ICD-10-CM | POA: Insufficient documentation

## 2019-08-01 DIAGNOSIS — E7800 Pure hypercholesterolemia, unspecified: Secondary | ICD-10-CM | POA: Diagnosis not present

## 2019-08-01 DIAGNOSIS — Z85038 Personal history of other malignant neoplasm of large intestine: Secondary | ICD-10-CM | POA: Diagnosis not present

## 2019-08-01 DIAGNOSIS — R9389 Abnormal findings on diagnostic imaging of other specified body structures: Secondary | ICD-10-CM

## 2019-08-01 DIAGNOSIS — Z8673 Personal history of transient ischemic attack (TIA), and cerebral infarction without residual deficits: Secondary | ICD-10-CM | POA: Insufficient documentation

## 2019-08-01 DIAGNOSIS — E559 Vitamin D deficiency, unspecified: Secondary | ICD-10-CM | POA: Insufficient documentation

## 2019-08-01 DIAGNOSIS — Z6835 Body mass index (BMI) 35.0-35.9, adult: Secondary | ICD-10-CM | POA: Diagnosis not present

## 2019-08-01 DIAGNOSIS — E785 Hyperlipidemia, unspecified: Secondary | ICD-10-CM | POA: Diagnosis not present

## 2019-08-01 DIAGNOSIS — E1142 Type 2 diabetes mellitus with diabetic polyneuropathy: Secondary | ICD-10-CM | POA: Insufficient documentation

## 2019-08-01 LAB — CBC WITH DIFFERENTIAL/PLATELET
Abs Immature Granulocytes: 0.02 10*3/uL (ref 0.00–0.07)
Basophils Absolute: 0 10*3/uL (ref 0.0–0.1)
Basophils Relative: 1 %
Eosinophils Absolute: 0.1 10*3/uL (ref 0.0–0.5)
Eosinophils Relative: 2 %
HCT: 42.7 % (ref 36.0–46.0)
Hemoglobin: 14.3 g/dL (ref 12.0–15.0)
Immature Granulocytes: 0 %
Lymphocytes Relative: 18 %
Lymphs Abs: 1.3 10*3/uL (ref 0.7–4.0)
MCH: 32.6 pg (ref 26.0–34.0)
MCHC: 33.5 g/dL (ref 30.0–36.0)
MCV: 97.5 fL (ref 80.0–100.0)
Monocytes Absolute: 0.7 10*3/uL (ref 0.1–1.0)
Monocytes Relative: 9 %
Neutro Abs: 5.2 10*3/uL (ref 1.7–7.7)
Neutrophils Relative %: 70 %
Platelets: 238 10*3/uL (ref 150–400)
RBC: 4.38 MIL/uL (ref 3.87–5.11)
RDW: 12.2 % (ref 11.5–15.5)
WBC: 7.4 10*3/uL (ref 4.0–10.5)
nRBC: 0 % (ref 0.0–0.2)

## 2019-08-01 LAB — COMPREHENSIVE METABOLIC PANEL
ALT: 19 U/L (ref 0–44)
AST: 22 U/L (ref 15–41)
Albumin: 3.9 g/dL (ref 3.5–5.0)
Alkaline Phosphatase: 75 U/L (ref 38–126)
Anion gap: 7 (ref 5–15)
BUN: 19 mg/dL (ref 8–23)
CO2: 27 mmol/L (ref 22–32)
Calcium: 9.2 mg/dL (ref 8.9–10.3)
Chloride: 105 mmol/L (ref 98–111)
Creatinine, Ser: 0.79 mg/dL (ref 0.44–1.00)
GFR calc Af Amer: >60 mL/min (ref >60)
GFR calc non Af Amer: >60 mL/min (ref >60)
Glucose, Bld: 140 mg/dL — ABNORMAL HIGH (ref 70–99)
Potassium: 3.8 mmol/L (ref 3.5–5.1)
Sodium: 139 mmol/L (ref 135–145)
Total Bilirubin: 0.7 mg/dL (ref 0.3–1.2)
Total Protein: 7.9 g/dL (ref 6.5–8.1)

## 2019-08-01 MED ORDER — SODIUM CHLORIDE 0.9% FLUSH
10.0000 mL | Freq: Once | INTRAVENOUS | Status: AC
  Administered 2019-08-01: 11:00:00 10 mL via INTRAVENOUS
  Filled 2019-08-01: qty 10

## 2019-08-01 MED ORDER — HEPARIN SOD (PORK) LOCK FLUSH 100 UNIT/ML IV SOLN
500.0000 [IU] | Freq: Once | INTRAVENOUS | Status: AC
  Administered 2019-08-01: 11:00:00 500 [IU] via INTRAVENOUS

## 2019-08-01 NOTE — Progress Notes (Signed)
Susan Duffy OFFICE PROGRESS NOTE  Patient Care Team: Susan Sanders, MD as PCP - General  Cancer Staging No matching staging information was found for the patient.   Oncology History Overview Note  # 2005- COLON CA [Stage II- left sided; s/p Surgery in [facility] [place]]; no Adj chemo.   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   # Nov 2010-LLE DVT [on Tam] on coumadin; AUG 2017- STOP coumadin  # ; PN-G-1; port flush q 6 w  # Left kidney cyst [Feb 2017]  # colonoscopy- 2019 [Dr.Mann; [place]]  DIAGNOSIS: METASTATIC COLON CA  STAGE: IV; NED        ;GOALS: control  CURRENT/MOST RECENT THERAPY: surveillance    Cancer of sigmoid colon ([place])      INTERVAL HISTORY:  Susan Duffy 73 y.o.  female pleasant patient above history of metastatic colon cancer currently on surveillance is here for follow-up/review results the pelvic ultrasound that was ordered given endometrial thickening noted on previous CT scan..   Patient denies any new onset of aches and pains or bone pain.  She is chronic back pain SI joint pain.  Not any worse.  She denies any vaginal bleeding.  Denies abdominal discomfort.   Review of Systems  Constitutional: Negative for chills, diaphoresis, fever, malaise/fatigue and weight loss.  HENT: Negative for nosebleeds and sore throat.   Eyes: Negative for double vision.  Respiratory: Negative for cough, hemoptysis, sputum production, shortness of breath and wheezing.   Cardiovascular: Negative for chest pain, palpitations, orthopnea and leg swelling.  Gastrointestinal: Negative for abdominal pain, blood in stool, constipation, diarrhea, heartburn, melena, nausea and vomiting.  Genitourinary: Negative  for dysuria, frequency and urgency.  Musculoskeletal: Positive for back pain and joint pain.  Skin: Negative.  Negative for itching and rash.  Neurological: Negative for dizziness, tingling, focal weakness, weakness and headaches.  Endo/Heme/Allergies: Does not bruise/bleed easily.  Psychiatric/Behavioral: Negative for depression. The patient is not nervous/anxious and does not have insomnia.       PAST MEDICAL HISTORY :  Past Medical History:  Diagnosis Date  . Allergy   . Breast CA ([place])   . Breast cancer ([place]) 2010   in situ and took radiation, no chemo needed  . Colon cancer ([place])    stage 4 colon cancer  . History of chemotherapy 02/2011-08/2011   FOLFOX/AVASTIN- 8 CYCLES  . History of DVT (deep vein thrombosis) 2010   while taking tamoxifen-left lower extremity  . History of peripheral neuropathy   . Hyperlipidemia   . Hypertension   . IDA (iron deficiency anemia)   . Osteoarthritis   . Personal history of chemotherapy    colon ca  . Personal history of radiation therapy    breast and colon  . TIA (transient ischemic attack) 03/08/2011  . Vitamin D deficiency     PAST SURGICAL HISTORY :   Past Surgical History:  Procedure Laterality Date  . BREAST EXCISIONAL BIOPSY Left 1-10   s/p lumpectomy and radiation left dcis  . BREAST EXCISIONAL BIOPSY Left    fibroadeoma reoved age 18  . BREAST LUMPECTOMY  2010   left breast  . broken ankle  Left 08/2012  . COLON SURGERY     colectomy, partial  .  LOBECTOMY  06-2009   metastatic colon cancer    FAMILY HISTORY :   Family History  Problem Relation Age of Onset  . Hypertension Mother   . Stroke Mother   . Coronary artery disease Mother   . Thyroid disease Mother   . Diabetes Father   . Hypertension Father   . Lung cancer Father   . Heart attack Maternal Grandfather   . Breast cancer Maternal Grandmother   . Thyroid disease Brother   . Diabetes Paternal Grandmother   . Colon cancer Paternal Grandfather   . Deep  vein thrombosis Unknown     SOCIAL HISTORY:   Social History   Tobacco Use  . Smoking status: Never Smoker  . Smokeless tobacco: Never Used  Substance Use Topics  . Alcohol use: No  . Drug use: No    ALLERGIES:  is allergic to amoxicillin; erythromycin; and nitrofuran derivatives.  MEDICATIONS:  Current Outpatient Medications  Medication Sig Dispense Refill  . acetaminophen (TYLENOL) 500 MG tablet as needed for moderate pain. Take 1-2 by mouth every 6 hours as needed     . aspirin 81 MG chewable tablet Chew 81 mg by mouth daily.    . cholecalciferol (VITAMIN D) 400 UNITS TABS tablet Take 400 Units by mouth 2 (two) times daily.    . Cyanocobalamin (VITAMIN B-12) 2000 MCG TBCR Take 1 tablet by mouth daily.      Marland Kitchen lidocaine-prilocaine (EMLA) cream Apply 1 application topically as needed. To port a cath site approximately 1 to 2 hours prior to port needle insertion. 30 g 12  . losartan (COZAAR) 50 MG tablet Take 1 tablet (50 mg total) by mouth daily. 90 tablet 3  . simvastatin (ZOCOR) 40 MG tablet TAKE 1 TABLET (40 MG TOTAL) BY MOUTH AT BEDTIME. 90 tablet 1   No current facility-administered medications for this visit.    Facility-Administered Medications Ordered in Other Visits  Medication Dose Route Frequency Provider Last Rate Last Dose  . sodium chloride flush (NS) 0.9 % injection 10 mL  10 mL Intravenous PRN Cammie Sickle, MD   10 mL at 01/05/16 1135    PHYSICAL EXAMINATION: ECOG PERFORMANCE STATUS: 0 - Asymptomatic  BP (!) 143/89   Pulse 69   Temp 98.5 F (36.9 C) (Tympanic)   Resp 20   Ht 5\' 3"  (1.6 m)   BMI 35.69 kg/m   There were no vitals filed for this visit. Physical Exam  Constitutional: She is oriented to person, place, and time and well-developed, well-nourished, and in no distress.  Obese.  Walks by herself.  She is alone.  HENT:  Head: Normocephalic and atraumatic.  Mouth/Throat: Oropharynx is clear and moist. No oropharyngeal exudate.  Eyes:  Pupils are equal, round, and reactive to light.  Neck: Normal range of motion. Neck supple.  Cardiovascular: Normal rate and regular rhythm.  Pulmonary/Chest: No respiratory distress. She has no wheezes.  Abdominal: Soft. Bowel sounds are normal. She exhibits no distension and no mass. There is no abdominal tenderness. There is no rebound and no guarding.  Musculoskeletal: Normal range of motion.        General: No tenderness or edema.  Neurological: She is alert and oriented to person, place, and time.  Skin: Skin is warm.  Psychiatric: Affect normal.    SKIN:  no rashes or significant lesions    LABORATORY DATA:  I have reviewed the data as listed    Component Value Date/Time   NA 139 08/01/2019  1044   NA 141 12/20/2013 0901   K 3.8 08/01/2019 1044   K 4.1 12/20/2013 0901   CL 105 08/01/2019 1044   CL 106 12/18/2012 0842   CO2 27 08/01/2019 1044   CO2 27 12/20/2013 0901   GLUCOSE 140 (H) 08/01/2019 1044   GLUCOSE 103 12/20/2013 0901   GLUCOSE 109 (H) 12/18/2012 0842   BUN 19 08/01/2019 1044   BUN 23.2 12/20/2013 0901   CREATININE 0.79 08/01/2019 1044   CREATININE 1.04 12/31/2014 1023   CREATININE 1.0 12/20/2013 0901   CALCIUM 9.2 08/01/2019 1044   CALCIUM 10.1 12/20/2013 0901   PROT 7.9 08/01/2019 1044   PROT 7.9 12/31/2014 1023   PROT 8.5 (H) 12/20/2013 0901   ALBUMIN 3.9 08/01/2019 1044   ALBUMIN 3.3 (L) 12/31/2014 1023   ALBUMIN 3.7 12/20/2013 0901   AST 22 08/01/2019 1044   AST 24 12/31/2014 1023   AST 21 12/20/2013 0901   ALT 19 08/01/2019 1044   ALT 28 12/31/2014 1023   ALT 18 12/20/2013 0901   ALKPHOS 75 08/01/2019 1044   ALKPHOS 135 (H) 12/31/2014 1023   ALKPHOS 144 12/20/2013 0901   BILITOT 0.7 08/01/2019 1044   BILITOT 0.4 12/31/2014 1023   BILITOT 0.60 12/20/2013 0901   GFRNONAA >60 08/01/2019 1044   GFRNONAA 56 (L) 12/31/2014 1023   GFRNONAA 57 (L) 06/24/2014 1300   GFRAA >60 08/01/2019 1044   GFRAA >60 12/31/2014 1023   GFRAA >60 06/24/2014  1300    No results found for: SPEP, UPEP  Lab Results  Component Value Date   WBC 7.4 08/01/2019   NEUTROABS 5.2 08/01/2019   HGB 14.3 08/01/2019   HCT 42.7 08/01/2019   MCV 97.5 08/01/2019   PLT 238 08/01/2019      Chemistry      Component Value Date/Time   NA 139 08/01/2019 1044   NA 141 12/20/2013 0901   K 3.8 08/01/2019 1044   K 4.1 12/20/2013 0901   CL 105 08/01/2019 1044   CL 106 12/18/2012 0842   CO2 27 08/01/2019 1044   CO2 27 12/20/2013 0901   BUN 19 08/01/2019 1044   BUN 23.2 12/20/2013 0901   CREATININE 0.79 08/01/2019 1044   CREATININE 1.04 12/31/2014 1023   CREATININE 1.0 12/20/2013 0901      Component Value Date/Time   CALCIUM 9.2 08/01/2019 1044   CALCIUM 10.1 12/20/2013 0901   ALKPHOS 75 08/01/2019 1044   ALKPHOS 135 (H) 12/31/2014 1023   ALKPHOS 144 12/20/2013 0901   AST 22 08/01/2019 1044   AST 24 12/31/2014 1023   AST 21 12/20/2013 0901   ALT 19 08/01/2019 1044   ALT 28 12/31/2014 1023   ALT 18 12/20/2013 0901   BILITOT 0.7 08/01/2019 1044   BILITOT 0.4 12/31/2014 1023   BILITOT 0.60 12/20/2013 0901       RADIOGRAPHIC STUDIES: I have personally reviewed the radiological images as listed and agreed with the findings in the report. No results found.   ASSESSMENT & PLAN:  Cancer of sigmoid colon [facility]) # Metastatic colon cancer chest wall lung recurrence/ status post lobectomy- 2010. AUG 2019- NED; fibroid uterus; endometrial thickening [see discussion below]. Colo- 2019- wnl.  Stable.  # Clinically no evidence of recurrence. Continue surveillance without imaging.   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 Again awaiting- mammogram in oct 2020.   #Endometrial thickening-no discharge no bleeding; incidental on CT scan; Sep 2019.  Pelvic ultrasound shows endometrial thickening; discussed with  Dr. Fransisca Connors gynecology oncology.  Plan biopsy today.  # DISPOSITION: # Follow-up with in  6 months-MD/port flush; CBC CMP [facility]- Dr.B.       No orders of the defined types were placed in this encounter.  All questions were answered. The patient knows to call the clinic with any problems, questions or concerns.      Cammie Sickle, MD 08/01/2019 1:02 PM

## 2019-08-01 NOTE — Assessment & Plan Note (Addendum)
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 AUG 2019- NED; fibroid uterus; endometrial thickening [see discussion below]. Colo- 2019- wnl.  Stable.  # Clinically no evidence of recurrence. Continue surveillance without imaging.   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 Again awaiting- mammogram in oct 2020.   #Endometrial thickening-no discharge no bleeding; incidental on CT scan; Sep 2019.  Pelvic ultrasound shows endometrial thickening; discussed with Dr. Fransisca Connors gynecology oncology.  Plan biopsy today.  # DISPOSITION: # Follow-up with in  6 months-MD/port flush; CBC CMP [facility]- Dr.B.

## 2019-08-01 NOTE — Progress Notes (Signed)
Gynecologic Oncology Consult Visit   Referring Provider: Dr. Rogue Bussing  Chief Complaint: Endometrial Thickening  Subjective:  Susan Duffy is a 73 y.o. female who is seen in consultation from Dr. Rogue Bussing for endometrial biopsy for endometrial thickening seen on imaging for surveillance of her colon cancer.   07/08/2018- CT A/P showed fibroid uterus, thickening and heterogeneity of endometrium  02/07/2019- U/S Uterus: 6.8 x 4.3 x 5.3 cm, retroverted, anterior wall transmural leiomyoma  4.1 x 3.7 x 4.3 cm Endometrium:  Thickness- 6mm, abnormal heterogenous appearance w/ small foci of fluid and hypoechogenicity. No discrete mass Right Ovary:  Measurement: 2.7 x 1.9 x 1.9. Internal blood flow present Left Ovary:  Not visualized  She was asymptomatic at that time and Dr. Fransisca Connors recommended repeating in 6 months to re-evaluate.   07/27/2019 U/S  Uterus: 8.4 x 5.5 x 5.3, anterior wall transmural leiomyoma 3.3 x 3.1 x 2.9 Endometrium: thickness 26mm, heterogenous, w/ several scattered small cystic structures and minimal fluid Right ovary: 2.2 x 1.5 x 1.9, small cyst within 12 x 15 x 14 mm.  Left ovary: 1.8 x 1.4 x 1.9, small cyst 10 x 11 x 11 mm.  No free fluid or other adnexal masses.   She has a history of tamoxifen use and developed a DVT in left lower extremity while on chemotherapy & tamoxfen in 2010. She was on coumadin for close approximately 6 years and Dr. Rogue Bussing cited that risk of falls on coumadin and that DVT was likely provoked, he recommended it. She is no longer on anticoagulation. She takes aspirin daily.   Today, she has seen Dr. Rogue Bussing. She complains of some urinary incontinence. No vaginal bleeding or spotting.    Problem List: Patient Active Problem List   Diagnosis Date Noted  . Cancer of sigmoid colon ([place]) 08/02/2016  . Chronic pain of right ankle 04/22/2015  . Routine general medical examination at a health care facility 10/22/2014  .  Counseling regarding end of life decision making 10/22/2014  . Vitamin D deficiency 04/17/2013  . Osteopenia 02/15/2013  . History of TIA (transient ischemic attack) 03/08/2011  . PERIPHERAL NEUROPATHY 11/06/2010  . PHLEBITIS&THROMBOPHLEB SUP VESSELS LOWER EXTREM 11/05/2009  . Type II diabetes mellitus, well controlled ([place]) 04/22/2009  . History of ductal carcinoma in situ (DCIS) of breast 12/17/2008  . Essential hypertension, benign 10/17/2008  . HYPERCHOLESTEROLEMIA 10/14/2008  . Severe obesity (BMI 35.0-39.9) with comorbidity ([place]) 09/05/2008  . Anemia, iron deficiency 09/05/2008  . ALLERGIC RHINITIS 09/05/2008  . OSTEOARTHRITIS 09/05/2008    Past Medical History: Past Medical History:  Diagnosis Date  . Allergy   . Breast CA ([place])   . Breast cancer ([place]) 2010   in situ and took radiation, no chemo needed  . Colon cancer ([place])    stage 4 colon cancer  . History of chemotherapy 02/2011-08/2011   FOLFOX/AVASTIN- 8 CYCLES  . History of DVT (deep vein thrombosis) 2010   while taking tamoxifen-left lower extremity  . History of peripheral neuropathy   . Hyperlipidemia   . Hypertension   . IDA (iron deficiency anemia)   . Osteoarthritis   . Personal history of chemotherapy    colon ca  . Personal history of radiation therapy    breast and colon  . TIA (transient ischemic attack) 03/08/2011  . Vitamin D deficiency     Past Surgical History: Past Surgical History:  Procedure Laterality Date  . BREAST EXCISIONAL BIOPSY Left 1-10   s/p lumpectomy and radiation left dcis  .  BREAST EXCISIONAL BIOPSY Left    fibroadeoma reoved age 5  . BREAST LUMPECTOMY  2010   left breast  . broken ankle  Left 08/2012  . COLON SURGERY     colectomy, partial  . LOBECTOMY  06-2009   metastatic colon cancer    Past Gynecologic History:  Menarche: age 61 Menstrual details: 5-7 days Menses regular: regular Last Menstrual Period: post menopausal    OB History:  OB History  No obstetric  history on file.    Family History: Family History  Problem Relation Age of Onset  . Hypertension Mother   . Stroke Mother   . Coronary artery disease Mother   . Thyroid disease Mother   . Diabetes Father   . Hypertension Father   . Lung cancer Father   . Heart attack Maternal Grandfather   . Breast cancer Maternal Grandmother   . Thyroid disease Brother   . Diabetes Paternal Grandmother   . Colon cancer Paternal Grandfather   . Deep vein thrombosis Unknown     Social History: Social History   Socioeconomic History  . Marital status: Single    Spouse name: Not on file  . Number of children: Not on file  . Years of education: Not on file  . Highest education level: Not on file  Occupational History  . 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 Financial resource strain: Not on file  . Food insecurity    Worry: Not on file    Inability: Not on file  . Transportation needs    Medical: Not on file    Non-medical: Not on file  Tobacco Use  . Smoking status: Never Smoker  . Smokeless tobacco: Never Used  Substance and Sexual Activity  . Alcohol use: No  . Drug use: No  . Sexual activity: Not Currently  Lifestyle  . Physical activity    Days per week: Not on file    Minutes per session: Not on file  . Stress: Not on file  Relationships  . Social Herbalist on phone: Not on file    Gets together: Not on file    Attends religious service: Not on file    Active member of club or organization: Not on file    Attends meetings of clubs or organizations: Not on file    Relationship status: Not on file  . Intimate partner violence    Fear of current or ex partner: Not on file    Emotionally abused: Not on file    Physically abused: Not on file    Forced sexual activity: Not on file  Other Topics Concern  . Not on file  Social History Narrative   Patient walks .   Patient eats fruits and veggies, avoids  salt and sugar, no fried food    Has living will, full code, HCPOA Zenovia Justman, son (reviewed 2014)    Allergies: Allergies  Allergen Reactions  . Amoxicillin Other (See Comments)    REACTION: Diarrhea  . Erythromycin Nausea And Vomiting  . Nitrofuran Derivatives Nausea And Vomiting    Current Medications: Current Outpatient Medications  Medication Sig Dispense Refill  . acetaminophen (TYLENOL) 500 MG tablet as needed for moderate pain. Take 1-2 by mouth every 6 hours as needed     . aspirin 81 MG chewable tablet Chew 81 mg by mouth daily.    . cholecalciferol (VITAMIN D) 400  UNITS TABS tablet Take 400 Units by mouth 2 (two) times daily.    . Cyanocobalamin (VITAMIN B-12) 2000 MCG TBCR Take 1 tablet by mouth daily.      Marland Kitchen lidocaine-prilocaine (EMLA) cream Apply 1 application topically as needed. To port a cath site approximately 1 to 2 hours prior to port needle insertion. 30 g 12  . losartan (COZAAR) 50 MG tablet Take 1 tablet (50 mg total) by mouth daily. 90 tablet 3  . simvastatin (ZOCOR) 40 MG tablet TAKE 1 TABLET (40 MG TOTAL) BY MOUTH AT BEDTIME. 90 tablet 1   No current facility-administered medications for this visit.    Facility-Administered Medications Ordered in Other Visits  Medication Dose Route Frequency Provider Last Rate Last Dose  . sodium chloride flush (NS) 0.9 % injection 10 mL  10 mL Intravenous PRN Cammie Sickle, MD   10 mL at 01/05/16 1135    Review of Systems General: negative for fevers, chills, fatigue, changes in sleep, changes in weight or appetite Skin: negative for changes in color, texture, moles or lesions Eyes: negative for changes in vision, pain, diplopia HEENT: negative for change in hearing, pain, discharge, tinnitus, vertigo, voice changes, sore throat, neck masses Breasts: negative for breast lumps Pulmonary: negative for dyspnea, orthopnea, productive cough Cardiac: negative for palpitations, syncope, pain, discomfort,  pressure Gastrointestinal: negative for dysphagia, nausea, vomiting, jaundice, pain, constipation, diarrhea, hematemesis, hematochezia Genitourinary/Sexual: negative for dysuria, discharge, hesitancy, nocturia, retention, stones, infections, STD's, incontinence Ob/Gyn: negative for irregular bleeding, pain Musculoskeletal: negative for pain, stiffness, swelling, range of motion limitation Hematology: negative for easy bruising, bleeding Neurologic/Psych: negative for headaches, seizures, paralysis, weakness, tremor, change in gait, change in sensation, mood swings, depression, anxiety, change in memory   Objective:  Physical Examination:  There were no vitals taken for this visit.    ECOG Performance Status: 0 - Asymptomatic  GENERAL: Patient is a well appearing female in no acute distress HEENT:  PERRL, neck supple with midline trachea. Thyroid without masses.  NODES:  No cervical, supraclavicular, axillary, or inguinal lymphadenopathy palpated.  LUNGS:  Clear to auscultation bilaterally.  No wheezes or rhonchi. HEART:  Regular rate and rhythm. No murmur appreciated. ABDOMEN:  Soft, nontender.  Positive, normoactive bowel sounds.  MSK:  No focal spinal tenderness to palpation. Full range of motion bilaterally in the upper extremities. EXTREMITIES:  No peripheral edema.   SKIN:  Clear with no obvious rashes or skin changes. No nail dyscrasia. NEURO:  Nonfocal. Well oriented.  Appropriate affect.  Pelvic: EGBUS: no lesions Cervix: no lesions, nontender, mobile Vagina: no lesions, no discharge or bleeding Uterus: normal size, nontender, mobile Adnexa: no palpable masses Rectovaginal: confirmatory  Endometrial biopsy done with consent:  Cervical os stenotic but opened easily with os finder and sounded to 8 cm.  Minimal tissue on three passes with pipelle. Tolerated the procedure well.     Assessment:  Susan Duffy is a 73 y.o. female with metastatic colon cancer found on CT  scan to have thickened endometrium that was 9 mm on [place].  No bleeding.  History of breast cancer treated with tamoxifen in the past.   Medical co-morbidities complicating care: metastatic colon cancer.  History of breast cancer.  Plan:   Problem List Items Addressed This Visit      Other   Endometrial thickening on ultrasound - Primary      We discussed options for management including endometrial biopsy and this was performed today.  Will await results, which  I suspect will be normal, in which case no further interventions needed.  If EIN or cancer than will discuss hysterectomy.  The patient's diagnosis, an outline of the further diagnostic and laboratory studies which will be required, the recommendation for surgery, and alternatives were discussed with her and her accompanying family members.  All questions were answered to their satisfaction.  A total of 30 minutes were spent with the patient/family today;  40 % was spent in education, counseling and coordination of care for thickened endometrial stripe.    Verlon Au, NP    CC:  Jinny Sanders, MD [address] [place],  [place] [address] [phone]

## 2019-08-01 NOTE — Patient Instructions (Signed)

## 2019-08-02 LAB — CEA: CEA: 1.4 ng/mL (ref 0.0–4.7)

## 2019-08-03 ENCOUNTER — Telehealth: Payer: Self-pay

## 2019-08-03 LAB — SURGICAL PATHOLOGY

## 2019-08-03 NOTE — Telephone Encounter (Signed)
Negative biopsy results called to Susan Duffy. Per Dr. Fransisca Connors, no further intervention is needed.  DIAGNOSIS:  A. ENDOMETRIUM; BIOPSY:  - FEW FRAGMENTS OF INACTIVE ENDOMETRIUM AND STRIPS OF ATROPHIC  ENDOMETRIUM.  - PREDOMINANTLY BLOOD AND MUCIN WITH INFLAMMATORY DEBRIS.  - NEGATIVE FOR ATYPIA AND MALIGNANCY

## 2019-08-28 DIAGNOSIS — Z23 Encounter for immunization: Secondary | ICD-10-CM | POA: Diagnosis not present

## 2019-09-05 ENCOUNTER — Ambulatory Visit: Payer: Medicare Other

## 2019-09-07 ENCOUNTER — Ambulatory Visit
Admission: RE | Admit: 2019-09-07 | Discharge: 2019-09-07 | Disposition: A | Discharge status: Home / Self Care | Payer: Medicare Other | Source: Ambulatory Visit | Attending: Family Medicine | Admitting: Family Medicine

## 2019-09-07 DIAGNOSIS — Z1231 Encounter for screening mammogram for malignant neoplasm of breast: Secondary | ICD-10-CM | POA: Diagnosis not present

## 2019-09-16 ENCOUNTER — Other Ambulatory Visit: Payer: Self-pay | Admitting: Family Medicine

## 2019-09-17 MED ORDER — LOSARTAN POTASSIUM 50 MG PO TABS: 50.0000 mg | ORAL_TABLET | Freq: Every day | ORAL | 1 refills | Status: DC

## 2019-10-03 ENCOUNTER — Other Ambulatory Visit: Payer: Self-pay

## 2019-10-03 ENCOUNTER — Inpatient Hospital Stay: Payer: Medicare Other | Attending: Internal Medicine

## 2019-10-03 DIAGNOSIS — C187 Malignant neoplasm of sigmoid colon: Secondary | ICD-10-CM | POA: Insufficient documentation

## 2019-10-03 DIAGNOSIS — Z95828 Presence of other vascular implants and grafts: Secondary | ICD-10-CM

## 2019-10-03 DIAGNOSIS — Z452 Encounter for adjustment and management of vascular access device: Secondary | ICD-10-CM | POA: Insufficient documentation

## 2019-10-03 MED ORDER — SODIUM CHLORIDE 0.9% FLUSH
10.0000 mL | Freq: Once | INTRAVENOUS | Status: AC
  Administered 2019-10-03: 10 mL via INTRAVENOUS
  Filled 2019-10-03: qty 10

## 2019-10-03 MED ORDER — HEPARIN SOD (PORK) LOCK FLUSH 100 UNIT/ML IV SOLN
500.0000 [IU] | Freq: Once | INTRAVENOUS | Status: AC
  Administered 2019-10-03: 500 [IU] via INTRAVENOUS

## 2019-10-31 ENCOUNTER — Other Ambulatory Visit: Payer: Self-pay

## 2019-11-05 LAB — HM DIABETES FOOT EXAM

## 2019-11-19 ENCOUNTER — Other Ambulatory Visit: Payer: Self-pay

## 2019-11-19 ENCOUNTER — Ambulatory Visit (INDEPENDENT_AMBULATORY_CARE_PROVIDER_SITE_OTHER): Payer: Medicare Other

## 2019-11-19 ENCOUNTER — Ambulatory Visit: Payer: Medicare Other

## 2019-11-19 DIAGNOSIS — Z0000 Encounter for general adult medical examination without abnormal findings: Secondary | ICD-10-CM

## 2019-11-19 NOTE — Progress Notes (Signed)
Subjective:   Susan Duffy is a 73 y.o. female who presents for Medicare Annual (Subsequent) preventive examination.  Review of Systems: N/A   This visit is being conducted through telemedicine via telephone at the nurse health advisor's home address due to the COVID-19 pandemic. This patient has given me verbal consent via doximity to conduct this visit, patient states they are participating from their home address. Patient and myself are on the telephone call. There is no referral for this visit. Some vital signs may be absent or patient reported.    Patient identification: identified by name, DOB, and current address   Cardiac Risk Factors include: advanced age (>35men, >76 women);diabetes mellitus;hypertension;Other (see comment), Risk factor comments: hypercholesterolemia     Objective:     Vitals: There were no vitals taken for this visit.  There is no height or weight on file to calculate BMI.  Advanced Directives 11/19/2019 08/01/2019 01/31/2019 11/14/2018 08/02/2018 08/02/2018 02/01/2018  Does Patient Have a Medical Advance Directive? Yes Yes Yes Yes No No Yes  Type of Paramedic of [place];Living will Living will;Healthcare Power of Attorney Living will;Healthcare Power of [place];Living will - - Living will;Healthcare Power of Attorney  Does patient want to make changes to medical advance directive? - No - Patient declined No - Patient declined - - - No - Patient declined  Copy of [place] in Chart? No - copy requested No - copy requested No - copy requested No - copy requested - - No - copy requested  Would patient like information on creating a medical advance directive? - - - - No - Patient declined No - Patient declined -    Tobacco Social History   Tobacco Use  Smoking Status Never Smoker  Smokeless Tobacco Never Used     Counseling given: Not Answered   Clinical Intake:  Pre-visit  preparation completed: Yes  Pain : No/denies pain     Nutritional Risks: None Diabetes: Yes CBG done?: No Did pt. bring in CBG monitor from home?: No  How often do you need to have someone help you when you read instructions, pamphlets, or other written materials from your doctor or pharmacy?: 1 - Never What is the last grade level you completed in school?: Post graduate  Interpreter Needed?: No  Information entered by :: CJohnson, LPN  Past Medical History:  Diagnosis Date  . Allergy   . Breast CA ([place])   . Breast cancer ([place]) 2010   in situ and took radiation, no chemo needed  . Colon cancer ([place])    stage 4 colon cancer  . History of chemotherapy 02/2011-08/2011   FOLFOX/AVASTIN- 8 CYCLES  . History of DVT (deep vein thrombosis) 2010   while taking tamoxifen-left lower extremity  . History of peripheral neuropathy   . Hyperlipidemia   . Hypertension   . IDA (iron deficiency anemia)   . Osteoarthritis   . Personal history of chemotherapy    colon ca  . Personal history of radiation therapy    breast and colon  . TIA (transient ischemic attack) 03/08/2011  . Vitamin D deficiency    Past Surgical History:  Procedure Laterality Date  . BREAST EXCISIONAL BIOPSY Left 1-10   s/p lumpectomy and radiation left dcis  . BREAST EXCISIONAL BIOPSY Left    fibroadeoma reoved age 49  . BREAST LUMPECTOMY  2010   left breast  . broken ankle  Left 08/2012  . COLON  SURGERY     colectomy, partial  . LOBECTOMY  06-2009   metastatic colon cancer   Family History  Problem Relation Age of Onset  . Hypertension Mother   . Stroke Mother   . Coronary artery disease Mother   . Thyroid disease Mother   . Diabetes Father   . Hypertension Father   . Lung cancer Father   . Heart attack Maternal Grandfather   . Breast cancer Maternal Grandmother   . Thyroid disease Brother   . Diabetes Paternal Grandmother   . Colon cancer Paternal Grandfather   . Deep vein thrombosis Other     Social History   Socioeconomic History  . Marital status: Single    Spouse name: Not on file  . Number of children: Not on file  . Years of education: Not on file  . Highest education level: Not on file  Occupational History  . 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 Smoking status: Never Smoker  . Smokeless tobacco: Never Used  Substance and Sexual Activity  . Alcohol use: No  . Drug use: No  . Sexual activity: Not Currently  Other Topics Concern  . Not on file  Social History Narrative   Patient walks .   Patient eats fruits and veggies, avoids salt and sugar, no fried food    Has living will, full code, HCPOA Susan Duffy, son (reviewed 2014)   Social Determinants of Health   Financial Resource Strain: Low Risk   . Difficulty of Paying Living Expenses: Not hard at all  Food Insecurity: No Food Insecurity  . Worried About Charity fundraiser in the Last Year: Never true  . Ran Out of Food in the Last Year: Never true  Transportation Needs: No Transportation Needs  . Lack of Transportation (Medical): No  . Lack of Transportation (Non-Medical): No  Physical Activity: Inactive  . Days of Exercise per Week: 0 days  . Minutes of Exercise per Session: 0 min  Stress: No Stress Concern Present  . Feeling of Stress : Not at all  Social Connections:   . Frequency of Communication with Friends and Family: Not on file  . Frequency of Social Gatherings with Friends and Family: Not on file  . Attends Religious Services: Not on file  . Active Member of Clubs or Organizations: Not on file  . Attends Archivist Meetings: Not on file  . Marital Status: Not on file    Outpatient Encounter Medications as of 11/19/2019  Medication Sig  . acetaminophen (TYLENOL) 500 MG tablet as needed for moderate pain. Take 1-2 by mouth every 6 hours as needed   . aspirin 81 MG chewable tablet Chew 81 mg by mouth daily.  .  cholecalciferol (VITAMIN D) 400 UNITS TABS tablet Take 400 Units by mouth 2 (two) times daily.  . Cyanocobalamin (VITAMIN B-12) 2000 MCG TBCR Take 1 tablet by mouth daily.    Marland Kitchen lidocaine-prilocaine (EMLA) cream Apply 1 application topically as needed. To port a cath site approximately 1 to 2 hours prior to port needle insertion.  Marland Kitchen losartan (COZAAR) 50 MG tablet Take 1 tablet (50 mg total) by mouth daily.  . simvastatin (ZOCOR) 40 MG tablet TAKE 1 TABLET (40 MG TOTAL) BY MOUTH AT BEDTIME.   Facility-Administered Encounter Medications as of 11/19/2019  Medication  . sodium chloride flush (NS) 0.9 % injection 10 mL    Activities of Daily  Living In your present state of health, do you have any difficulty performing the following activities: 11/19/2019  Hearing? N  Vision? N  Difficulty concentrating or making decisions? N  Walking or climbing stairs? N  Dressing or bathing? N  Doing errands, shopping? N  Preparing Food and eating ? N  Using the Toilet? N  In the past six months, have you accidently leaked urine? N  Do you have problems with loss of bowel control? N  Managing your Medications? N  Managing your Finances? N  Housekeeping or managing your Housekeeping? N  Some recent data might be hidden    Patient Care Team: Jinny Sanders, MD as PCP - General Clent Jacks, RN as Oncology Nurse Navigator    Assessment:   This is a routine wellness examination for [place].  Exercise Activities and Dietary recommendations Current Exercise Habits: Home exercise routine, Type of exercise: walking, Time (Minutes): 30, Frequency (Times/Week): 7, Weekly Exercise (Minutes/Week): 210, Intensity: Mild, Exercise limited by: None identified  Goals    . Increase physical activity     Starting 11/14/2018, I will continue to walk for 30 minutes 3 days per week.     . Weight (lb) < 200 lb (90.7 kg)     11/19/2019, I am currently working on losing weight and has lost around 9 lbs already.         Fall Risk Fall Risk  11/19/2019 11/28/2018 11/14/2018 11/08/2017 11/01/2016  Falls in the past year? 0 - 0 No Yes  Number falls in past yr: 0 - - - 1  Injury with Fall? 0 - - - No  Risk for fall due to : Medication side effect - - - -  Risk for fall due to: Comment - - - - -  Follow up Falls evaluation completed;Falls prevention discussed Falls evaluation completed;Falls prevention discussed - - -  Comment - Using cane - - -   Is the patient's home free of loose throw rugs in walkways, pet beds, electrical cords, etc?   yes      Grab bars in the bathroom? yes      Handrails on the stairs?   yes      Adequate lighting?   yes  Timed Get Up and Go performed: N/A  Depression Screen PHQ 2/9 Scores 11/19/2019 11/14/2018 11/08/2017 11/01/2016  PHQ - 2 Score 0 0 0 0  PHQ- 9 Score 0 0 - -     Cognitive Function MMSE - Mini Mental State Exam 11/19/2019 11/14/2018  Orientation to time 5 5  Orientation to Place 5 5  Registration 3 3  Attention/ Calculation 5 0  Recall 3 3  Language- name 2 objects - 0  Language- repeat 1 1  Language- follow 3 step command - 3  Language- read & follow direction - 0  Write a sentence - 0  Copy design - 0  Total score - 20  Mini Cog  Mini-Cog screen was completed. Maximum score is 22. A value of 0 denotes this part of the MMSE was not completed or the patient failed this part of the Mini-Cog screening.       Immunization History  Administered Date(s) Administered  . Influenza Split 09/08/2011  . Influenza Whole 09/12/2008, 09/03/2010, 08/23/2012  . Influenza, High Dose Seasonal PF 08/31/2013, 09/04/2014, 09/10/2015, 09/10/2016, 09/06/2017, 09/04/2018, 08/28/2019  . Pneumococcal Conjugate-13 10/22/2014  . Pneumococcal Polysaccharide-23 11/06/2010  . Td 10/17/2008    Qualifies for Shingles Vaccine? Yes  Screening  Tests Health Maintenance  Topic Date Due  . TETANUS/TDAP  12/06/2019 (Originally 10/17/2018)  . HEMOGLOBIN A1C   11/21/2019  . FOOT EXAM  11/29/2019  . MAMMOGRAM  09/06/2020  . OPHTHALMOLOGY EXAM  11/11/2020  . COLONOSCOPY  03/09/2023  . INFLUENZA VACCINE  Completed  . DEXA SCAN  Completed  . Hepatitis C Screening  Completed  . PNA vac Low Risk Adult  Completed    Cancer Screenings: Lung: Low Dose CT Chest recommended if Age 87-80 years, 30 pack-year currently smoking OR have quit w/in 15years. Patient does not qualify. Breast:  Up to date on Mammogram? Yes, completed 09/07/2019   Up to date of Bone Density/Dexa? Yes, completed 02/14/2013 Colorectal: completed 03/08/2018  Additional Screenings:  Hepatitis C Screening: 10/16/2015     Plan:    Patient is working on losing some weight.    I have personally reviewed and noted the following in the patient's chart:   . Medical and social history . Use of alcohol, tobacco or illicit drugs  . Current medications and supplements . Functional ability and status . Nutritional status . Physical activity . Advanced directives . List of other physicians . Hospitalizations, surgeries, and ER visits in previous 12 months . Vitals . Screenings to include cognitive, depression, and falls . Referrals and appointments  In addition, I have reviewed and discussed with patient certain preventive protocols, quality metrics, and best practice recommendations. A written personalized care plan for preventive services as well as general preventive health recommendations were provided to patient.     Andrez Grime, LPN  [id number]

## 2019-11-19 NOTE — Progress Notes (Signed)
PCP notes:  Health Maintenance: No gaps    Abnormal Screenings: none   Patient concerns: none   Nurse concerns: none   Next PCP appt.: 12/04/2019 @ 3 pm

## 2019-11-19 NOTE — Patient Instructions (Signed)
Susan Duffy , Thank you for taking time to come for your Medicare Wellness Visit. I appreciate your ongoing commitment to your health goals. Please review the following plan we discussed and let me know if I can assist you in the future.   Screening recommendations/referrals: Colonoscopy: Up to date, completed 03/08/2018 Mammogram: Up to date, completed 09/07/2019 Bone Density: Up to date, completed 02/14/2013 Recommended yearly ophthalmology/optometry visit for glaucoma screening and checkup Recommended yearly dental visit for hygiene and checkup  Vaccinations: Influenza vaccine: Up to date, completed 08/28/2019 Pneumococcal vaccine: Completed series Tdap vaccine: decline Shingles vaccine: will check with insurance    Advanced directives: Please bring a copy of your POA (Power of Attorney) and/or Living Will to your next appointment.   Conditions/risks identified: diabetes, hypertension, hypercholesterolemia  Next appointment: 12/04/2019 @ 3 pm    Preventive Care 26 Years and Older, Female Preventive care refers to lifestyle choices and visits with your health care provider that can promote health and wellness. What does preventive care include?  A yearly physical exam. This is also called an annual well check.  Dental exams once or twice a year.  Routine eye exams. Ask your health care provider how often you should have your eyes checked.  Personal lifestyle choices, including:  Daily care of your teeth and gums.  Regular physical activity.  Eating a healthy diet.  Avoiding tobacco and drug use.  Limiting alcohol use.  Practicing safe sex.  Taking low-dose aspirin every day.  Taking vitamin and mineral supplements as recommended by your health care provider. What happens during an annual well check? The services and screenings done by your health care provider during your annual well check will depend on your age, overall health, lifestyle risk factors, and family  history of disease. Counseling  Your health care provider may ask you questions about your:  Alcohol use.  Tobacco use.  Drug use.  Emotional well-being.  Home and relationship well-being.  Sexual activity.  Eating habits.  History of falls.  Memory and ability to understand (cognition).  Work and work Statistician.  Reproductive health. Screening  You may have the following tests or measurements:  Height, weight, and BMI.  Blood pressure.  Lipid and cholesterol levels. These may be checked every 5 years, or more frequently if you are over 66 years old.  Skin check.  Lung cancer screening. You may have this screening every year starting at age 60 if you have a 30-pack-year history of smoking and currently smoke or have quit within the past 15 years.  Fecal occult blood test (FOBT) of the stool. You may have this test every year starting at age 29.  Flexible sigmoidoscopy or colonoscopy. You may have a sigmoidoscopy every 5 years or a colonoscopy every 10 years starting at age 29.  Hepatitis C blood test.  Hepatitis B blood test.  Sexually transmitted disease (STD) testing.  Diabetes screening. This is done by checking your blood sugar (glucose) after you have not eaten for a while (fasting). You may have this done every 1-3 years.  Bone density scan. This is done to screen for osteoporosis. You may have this done starting at age 28.  Mammogram. This may be done every 1-2 years. Talk to your health care provider about how often you should have regular mammograms. Talk with your health care provider about your test results, treatment options, and if necessary, the need for more tests. Vaccines  Your health care provider may recommend certain vaccines, such as:  Influenza vaccine. This is recommended every year.  Tetanus, diphtheria, and acellular pertussis (Tdap, Td) vaccine. You may need a Td booster every 10 years.  Zoster vaccine. You may need this after  age 76.  Pneumococcal 13-valent conjugate (PCV13) vaccine. One dose is recommended after age 1.  Pneumococcal polysaccharide (PPSV23) vaccine. One dose is recommended after age 64. Talk to your health care provider about which screenings and vaccines you need and how often you need them. This information is not intended to replace advice given to you by your health care provider. Make sure you discuss any questions you have with your health care provider. Document Released: 12/19/2015 Document Revised: 08/11/2016 Document Reviewed: 09/23/2015 Elsevier Interactive Patient Education  2017 [employer] Prevention in the Home Falls can cause injuries. They can happen to people of all ages. There are many things you can do to make your home safe and to help prevent falls. What can I do on the outside of my home?  Regularly fix the edges of walkways and driveways and fix any cracks.  Remove anything that might make you trip as you walk through a door, such as a raised step or threshold.  Trim any bushes or trees on the path to your home.  Use bright outdoor lighting.  Clear any walking paths of anything that might make someone trip, such as rocks or tools.  Regularly check to see if handrails are loose or broken. Make sure that both sides of any steps have handrails.  Any raised decks and porches should have guardrails on the edges.  Have any leaves, snow, or ice cleared regularly.  Use sand or salt on walking paths during winter.  Clean up any spills in your garage right away. This includes oil or grease spills. What can I do in the bathroom?  Use night lights.  Install grab bars by the toilet and in the tub and shower. Do not use towel bars as grab bars.  Use non-skid mats or decals in the tub or shower.  If you need to sit down in the shower, use a plastic, non-slip stool.  Keep the floor dry. Clean up any water that spills on the floor as soon as it  happens.  Remove soap buildup in the tub or shower regularly.  Attach bath mats securely with double-sided non-slip rug tape.  Do not have throw rugs and other things on the floor that can make you trip. What can I do in the bedroom?  Use night lights.  Make sure that you have a light by your bed that is easy to reach.  Do not use any sheets or blankets that are too big for your bed. They should not hang down onto the floor.  Have a firm chair that has side arms. You can use this for support while you get dressed.  Do not have throw rugs and other things on the floor that can make you trip. What can I do in the kitchen?  Clean up any spills right away.  Avoid walking on wet floors.  Keep items that you use a lot in easy-to-reach places.  If you need to reach something above you, use a strong step stool that has a grab bar.  Keep electrical cords out of the way.  Do not use floor polish or wax that makes floors slippery. If you must use wax, use non-skid floor wax.  Do not have throw rugs and other things on the floor that  can make you trip. What can I do with my stairs?  Do not leave any items on the stairs.  Make sure that there are handrails on both sides of the stairs and use them. Fix handrails that are broken or loose. Make sure that handrails are as long as the stairways.  Check any carpeting to make sure that it is firmly attached to the stairs. Fix any carpet that is loose or worn.  Avoid having throw rugs at the top or bottom of the stairs. If you do have throw rugs, attach them to the floor with carpet tape.  Make sure that you have a light switch at the top of the stairs and the bottom of the stairs. If you do not have them, ask someone to add them for you. What else can I do to help prevent falls?  Wear shoes that:  Do not have high heels.  Have rubber bottoms.  Are comfortable and fit you well.  Are closed at the toe. Do not wear sandals.  If you  use a stepladder:  Make sure that it is fully opened. Do not climb a closed stepladder.  Make sure that both sides of the stepladder are locked into place.  Ask someone to hold it for you, if possible.  Clearly mark and make sure that you can see:  Any grab bars or handrails.  First and last steps.  Where the edge of each step is.  Use tools that help you move around (mobility aids) if they are needed. These include:  Canes.  Walkers.  Scooters.  Crutches.  Turn on the lights when you go into a dark area. Replace any light bulbs as soon as they burn out.  Set up your furniture so you have a clear path. Avoid moving your furniture around.  If any of your floors are uneven, fix them.  If there are any pets around you, be aware of where they are.  Review your medicines with your doctor. Some medicines can make you feel dizzy. This can increase your chance of falling. Ask your doctor what other things that you can do to help prevent falls. This information is not intended to replace advice given to you by your health care provider. Make sure you discuss any questions you have with your health care provider. Document Released: 09/18/2009 Document Revised: 04/29/2016 Document Reviewed: 12/27/2014 Elsevier Interactive Patient Education  2017 [employer].

## 2019-11-23 ENCOUNTER — Other Ambulatory Visit: Payer: Self-pay | Admitting: Family Medicine

## 2019-11-26 ENCOUNTER — Telehealth: Payer: Self-pay | Admitting: Family Medicine

## 2019-11-26 DIAGNOSIS — E7800 Pure hypercholesterolemia, unspecified: Secondary | ICD-10-CM

## 2019-11-26 DIAGNOSIS — E559 Vitamin D deficiency, unspecified: Secondary | ICD-10-CM

## 2019-11-26 DIAGNOSIS — E119 Type 2 diabetes mellitus without complications: Secondary | ICD-10-CM

## 2019-11-26 DIAGNOSIS — D509 Iron deficiency anemia, unspecified: Secondary | ICD-10-CM

## 2019-11-26 NOTE — Telephone Encounter (Signed)
-----   Message from Ellamae Sia sent at 11/21/2019 10:11 AM EST ----- Regarding: lab orders for Tuesday, 12.22.20 Patient is scheduled for CPX labs, please order future labs, Thanks , Karna Christmas

## 2019-11-27 ENCOUNTER — Encounter: Payer: Medicare Other | Admitting: Family Medicine

## 2019-11-27 ENCOUNTER — Other Ambulatory Visit (INDEPENDENT_AMBULATORY_CARE_PROVIDER_SITE_OTHER): Payer: Medicare Other

## 2019-11-27 DIAGNOSIS — E119 Type 2 diabetes mellitus without complications: Secondary | ICD-10-CM

## 2019-11-27 DIAGNOSIS — E559 Vitamin D deficiency, unspecified: Secondary | ICD-10-CM

## 2019-11-27 LAB — LIPID PANEL
Cholesterol: 140 mg/dL (ref 0–200)
HDL: 42.8 mg/dL (ref >39.00)
LDL Cholesterol: 73 mg/dL (ref 0–99)
NonHDL: 96.96
Total CHOL/HDL Ratio: 3
Triglycerides: 122 mg/dL (ref 0.0–149.0)
VLDL: 24.4 mg/dL (ref 0.0–40.0)

## 2019-11-27 LAB — COMPREHENSIVE METABOLIC PANEL
ALT: 20 U/L (ref 0–35)
AST: 22 U/L (ref 0–37)
Albumin: 4.2 g/dL (ref 3.5–5.2)
Alkaline Phosphatase: 81 U/L (ref 39–117)
BUN: 18 mg/dL (ref 6–23)
CO2: 25 mEq/L (ref 19–32)
Calcium: 9.8 mg/dL (ref 8.4–10.5)
Chloride: 104 mEq/L (ref 96–112)
Creatinine, Ser: 0.84 mg/dL (ref 0.40–1.20)
GFR: 65.99 mL/min (ref >60.00)
Glucose, Bld: 124 mg/dL — ABNORMAL HIGH (ref 70–99)
Potassium: 3.8 mEq/L (ref 3.5–5.1)
Sodium: 138 mEq/L (ref 135–145)
Total Bilirubin: 0.6 mg/dL (ref 0.2–1.2)
Total Protein: 8 g/dL (ref 6.0–8.3)

## 2019-11-27 LAB — HEMOGLOBIN A1C: Hgb A1c MFr Bld: 5.7 % (ref 4.6–6.5)

## 2019-11-27 LAB — VITAMIN D 25 HYDROXY (VIT D DEFICIENCY, FRACTURES): VITD: 50.06 ng/mL (ref 30.00–100.00)

## 2019-11-28 NOTE — Progress Notes (Signed)
No critical labs need to be addressed urgently. We will discuss labs in detail at upcoming office visit.   

## 2019-12-04 ENCOUNTER — Encounter: Payer: Self-pay | Admitting: Family Medicine

## 2019-12-04 ENCOUNTER — Other Ambulatory Visit: Payer: Self-pay

## 2019-12-04 ENCOUNTER — Ambulatory Visit (INDEPENDENT_AMBULATORY_CARE_PROVIDER_SITE_OTHER): Payer: Medicare Other | Admitting: Family Medicine

## 2019-12-04 VITALS — BP 140/90 | HR 71 | Temp 98.0°F | Ht 62.5 in | Wt 195.8 lb

## 2019-12-04 DIAGNOSIS — E7800 Pure hypercholesterolemia, unspecified: Secondary | ICD-10-CM

## 2019-12-04 DIAGNOSIS — I10 Essential (primary) hypertension: Secondary | ICD-10-CM | POA: Diagnosis not present

## 2019-12-04 DIAGNOSIS — E119 Type 2 diabetes mellitus without complications: Secondary | ICD-10-CM | POA: Diagnosis not present

## 2019-12-04 NOTE — Assessment & Plan Note (Signed)
Well controlled on statin.

## 2019-12-04 NOTE — Progress Notes (Signed)
Chief Complaint  Patient presents with  . Annual Exam    Part 2    History of Present Illness: HPI  The patient presents for review of chronic health problems. He/She also has the following acute concerns today: none  The patient saw a LPN or RN for medicare wellness visit.  Prevention and wellness was reviewed in detail. Note reviewed and important notes copied below. Health Maintenance: No gaps  Abnormal Screenings: None  12/04/19  Multiple primary cancer history:  Ductal carcinoma in situ left breast and  Metastatic colon cancer; Followed by ONc Dr. Rogue Bussing Reviewed last OV 08/01/2019  Port a cath in place, currently on suveillance and follow up q 6 months.  Elevated Cholesterol:  LDL almost at goal < 70 given hx of TIA and DM. On statin. Lab Results  Component Value Date   CHOL 140 11/27/2019   HDL 42.80 11/27/2019   LDLCALC 73 11/27/2019   LDLDIRECT 74.0 11/14/2018   TRIG 122.0 11/27/2019   CHOLHDL 3 11/27/2019  Using medications without problems: Muscle aches:  Diet compliance: good Exercise:  using cane Other complaints:    She has lost weight in last year. Wt Readings from Last 3 Encounters:  12/04/19 195 lb 12 oz (88.8 kg)  05/25/19 201 lb 8 oz (91.4 kg)  01/31/19 205 lb (93 kg)    Hypertension:    Good control on losartan BP Readings from Last 3 Encounters:  12/04/19 140/90  08/01/19 (!) 143/89  05/25/19 124/78  Using medication without problems or lightheadedness: none Chest pain with exertion:none Edema: stable swelling in left ankle Short of breath: stable Average home BPs: 120-125/80 Other issues:  Diabetes:  Well controlled with diet.  Lab Results  Component Value Date   HGBA1C 5.7 11/27/2019  Using medications without difficulties: Hypoglycemic episodes: Hyperglycemic episodes: Feet problems: has neuropathy from chemo, no ulcers Blood Sugars averaging:not checking eye exam within last year: yes 11/2019     This visit  occurred during the SARS-CoV-2 public health emergency.  Safety protocols were in place, including screening questions prior to the visit, additional usage of staff PPE, and extensive cleaning of exam room while observing appropriate contact time as indicated for disinfecting solutions.   COVID 19 screen:  No recent travel or known exposure to COVID19 The patient denies respiratory symptoms of COVID 19 at this time. The importance of social distancing was discussed today.     Review of Systems  Constitutional: Negative for chills and fever.  HENT: Negative for congestion and ear pain.   Eyes: Negative for pain and redness.  Respiratory: Negative for cough and shortness of breath.   Cardiovascular: Negative for chest pain, palpitations and leg swelling.  Gastrointestinal: Negative for abdominal pain, blood in stool, constipation, diarrhea, nausea and vomiting.  Genitourinary: Negative for dysuria.  Musculoskeletal: Negative for falls and myalgias.  Skin: Negative for rash.  Neurological: Negative for dizziness.  Psychiatric/Behavioral: Negative for depression. The patient is not nervous/anxious.       Past Medical History:  Diagnosis Date  . Allergy   . Breast CA ([place])   . Breast cancer ([place]) 2010   in situ and took radiation, no chemo needed  . Colon cancer ([place])    stage 4 colon cancer  . History of chemotherapy 02/2011-08/2011   FOLFOX/AVASTIN- 8 CYCLES  . History of DVT (deep vein thrombosis) 2010   while taking tamoxifen-left lower extremity  . History of peripheral neuropathy   . Hyperlipidemia   . Hypertension   .  IDA (iron deficiency anemia)   . Osteoarthritis   . Personal history of chemotherapy    colon ca  . Personal history of radiation therapy    breast and colon  . TIA (transient ischemic attack) 03/08/2011  . Vitamin D deficiency     reports that she has never smoked. She has never used smokeless tobacco. She reports that she does not drink alcohol or use  drugs.   Current Outpatient Medications:  .  acetaminophen (TYLENOL) 500 MG tablet, as needed for moderate pain. Take 1-2 by mouth every 6 hours as needed , Disp: , Rfl:  .  aspirin 81 MG chewable tablet, Chew 81 mg by mouth daily., Disp: , Rfl:  .  cholecalciferol (VITAMIN D) 400 UNITS TABS tablet, Take 400 Units by mouth 2 (two) times daily., Disp: , Rfl:  .  Cyanocobalamin (VITAMIN B-12) 2000 MCG TBCR, Take 1 tablet by mouth daily.  , Disp: , Rfl:  .  lidocaine-prilocaine (EMLA) cream, Apply 1 application topically as needed. To port a cath site approximately 1 to 2 hours prior to port needle insertion., Disp: 30 g, Rfl: 12 .  losartan (COZAAR) 50 MG tablet, Take 1 tablet (50 mg total) by mouth daily., Disp: 90 tablet, Rfl: 1 .  simvastatin (ZOCOR) 40 MG tablet, TAKE 1 TABLET BY MOUTH EVERYDAY AT BEDTIME, Disp: 90 tablet, Rfl: 1 No current facility-administered medications for this visit.  Facility-Administered Medications Ordered in Other Visits:  .  sodium chloride flush (NS) 0.9 % injection 10 mL, 10 mL, Intravenous, PRN, Cammie Sickle, MD, 10 mL at 01/05/16 1135   Observations/Objective: Blood pressure 140/90, pulse 71, temperature 98 F (36.7 C), temperature source Temporal, height 5' 2.5" (1.588 m), weight 195 lb 12 oz (88.8 kg), SpO2 92 %.  Physical Exam Constitutional:      General: She is not in acute distress.    Appearance: Normal appearance. She is well-developed. She is obese. She is not ill-appearing or toxic-appearing.  HENT:     Head: Normocephalic.     Right Ear: Hearing, tympanic membrane, ear canal and external ear normal.     Left Ear: Hearing, tympanic membrane, ear canal and external ear normal.     Nose: Nose normal.  Eyes:     General: Lids are normal. Lids are everted, no foreign bodies appreciated.     Conjunctiva/sclera: Conjunctivae normal.     Pupils: Pupils are equal, round, and reactive to light.  Neck:     Thyroid: No thyroid mass or  thyromegaly.     Vascular: No carotid bruit.     Trachea: Trachea normal.  Cardiovascular:     Rate and Rhythm: Normal rate and regular rhythm.     Heart sounds: Normal heart sounds, S1 normal and S2 normal. No murmur. No gallop.   Pulmonary:     Effort: Pulmonary effort is normal. No respiratory distress.     Breath sounds: Normal breath sounds. No wheezing, rhonchi or rales.  Abdominal:     General: Bowel sounds are normal. There is no distension or abdominal bruit.     Palpations: Abdomen is soft. There is no fluid wave or mass.     Tenderness: There is no abdominal tenderness. There is no guarding or rebound.     Hernia: No hernia is present.  Musculoskeletal:     Cervical back: Normal range of motion and neck supple.  Lymphadenopathy:     Cervical: No cervical adenopathy.  Skin:    General:  Skin is warm and dry.     Findings: No rash.  Neurological:     Mental Status: She is alert.     Cranial Nerves: No cranial nerve deficit.     Sensory: No sensory deficit.  Psychiatric:        Mood and Affect: Mood is not anxious or depressed.        Speech: Speech normal.        Behavior: Behavior normal. Behavior is cooperative.        Judgment: Judgment normal.      Diabetic foot exam: Normal inspection No skin breakdown Left foot calluses  Normal DP pulses Normal sensation to light touch and monofilament Nails normal  Assessment and Plan  The patient's preventative maintenance and recommended screening tests for an annual wellness exam were reviewed in full today. Brought up to date unless services declined.  Counselled on the importance of diet, exercise, and its role in overall health and mortality. The patient's FH and SH was reviewed, including their home life, tobacco status, and drug and alcohol status.   Vaccines: Uptodate except, considering shingles/Td. Mammo: Hx of breast cancer.Stable10/2020 Bone density:02/2013 osteopenia, repeat in 5 years. Colon:  history of stage 4 colon cancer... Per GI Dr. Collene Mares, colonoscopy 03/2018  Repeat in 3-5 years.Marland Kitchen PAP/DVE: DVE, pap not indicated, no symptoms. No vaginal bleeding Onc following thickening in uterus 07/2019... endometrial biopy.. negative. Hep C completed Nonsmoker. Reviewed advanced directives.   Essential hypertension, benign Well controlled. Continue current medication.   HYPERCHOLESTEROLEMIA  Well controlled on statin.   Type II diabetes mellitus, well controlled Diet controlled.  Severe obesity (BMI 35.0-39.9) with comorbidity ([place]) Improiving BMI with healthy eating.    [name], MD

## 2019-12-04 NOTE — Assessment & Plan Note (Signed)
Improiving BMI with healthy eating.

## 2019-12-04 NOTE — Assessment & Plan Note (Signed)
Diet controlled.  

## 2019-12-04 NOTE — Assessment & Plan Note (Signed)
Well controlled. Continue current medication.  

## 2019-12-05 ENCOUNTER — Other Ambulatory Visit: Payer: Self-pay

## 2019-12-05 ENCOUNTER — Inpatient Hospital Stay: Payer: Medicare Other | Attending: Internal Medicine

## 2019-12-05 DIAGNOSIS — Z452 Encounter for adjustment and management of vascular access device: Secondary | ICD-10-CM | POA: Insufficient documentation

## 2019-12-05 DIAGNOSIS — Z95828 Presence of other vascular implants and grafts: Secondary | ICD-10-CM

## 2019-12-05 DIAGNOSIS — C187 Malignant neoplasm of sigmoid colon: Secondary | ICD-10-CM | POA: Diagnosis present

## 2019-12-05 MED ORDER — HEPARIN SOD (PORK) LOCK FLUSH 100 UNIT/ML IV SOLN
500.0000 [IU] | Freq: Once | INTRAVENOUS | Status: AC
  Administered 2019-12-05: 500 [IU] via INTRAVENOUS
  Filled 2019-12-05: qty 5

## 2019-12-05 MED ORDER — SODIUM CHLORIDE 0.9% FLUSH
10.0000 mL | Freq: Once | INTRAVENOUS | Status: AC
  Administered 2019-12-05: 10 mL via INTRAVENOUS
  Filled 2019-12-05: qty 10

## 2020-01-30 ENCOUNTER — Other Ambulatory Visit: Payer: Self-pay | Admitting: *Deleted

## 2020-01-30 ENCOUNTER — Inpatient Hospital Stay (HOSPITAL_BASED_OUTPATIENT_CLINIC_OR_DEPARTMENT_OTHER): Payer: Medicare Other | Admitting: Internal Medicine

## 2020-01-30 ENCOUNTER — Inpatient Hospital Stay: Payer: Medicare Other | Attending: Internal Medicine

## 2020-01-30 ENCOUNTER — Other Ambulatory Visit: Payer: Self-pay

## 2020-01-30 ENCOUNTER — Encounter: Payer: Self-pay | Admitting: Internal Medicine

## 2020-01-30 DIAGNOSIS — Z95828 Presence of other vascular implants and grafts: Secondary | ICD-10-CM

## 2020-01-30 DIAGNOSIS — Z923 Personal history of irradiation: Secondary | ICD-10-CM | POA: Diagnosis not present

## 2020-01-30 DIAGNOSIS — Z803 Family history of malignant neoplasm of breast: Secondary | ICD-10-CM | POA: Diagnosis not present

## 2020-01-30 DIAGNOSIS — C187 Malignant neoplasm of sigmoid colon: Secondary | ICD-10-CM

## 2020-01-30 DIAGNOSIS — Z9221 Personal history of antineoplastic chemotherapy: Secondary | ICD-10-CM | POA: Insufficient documentation

## 2020-01-30 DIAGNOSIS — Z86000 Personal history of in-situ neoplasm of breast: Secondary | ICD-10-CM | POA: Insufficient documentation

## 2020-01-30 DIAGNOSIS — Z86718 Personal history of other venous thrombosis and embolism: Secondary | ICD-10-CM | POA: Diagnosis not present

## 2020-01-30 DIAGNOSIS — Z85038 Personal history of other malignant neoplasm of large intestine: Secondary | ICD-10-CM | POA: Diagnosis not present

## 2020-01-30 DIAGNOSIS — Z9049 Acquired absence of other specified parts of digestive tract: Secondary | ICD-10-CM | POA: Insufficient documentation

## 2020-01-30 DIAGNOSIS — Z902 Acquired absence of lung [part of]: Secondary | ICD-10-CM | POA: Insufficient documentation

## 2020-01-30 DIAGNOSIS — Z801 Family history of malignant neoplasm of trachea, bronchus and lung: Secondary | ICD-10-CM | POA: Diagnosis not present

## 2020-01-30 LAB — CBC WITH DIFFERENTIAL/PLATELET
Abs Immature Granulocytes: 0.02 10*3/uL (ref 0.00–0.07)
Basophils Absolute: 0.1 10*3/uL (ref 0.0–0.1)
Basophils Relative: 1 %
Eosinophils Absolute: 0.1 10*3/uL (ref 0.0–0.5)
Eosinophils Relative: 1 %
HCT: 44.5 % (ref 36.0–46.0)
Hemoglobin: 14.3 g/dL (ref 12.0–15.0)
Immature Granulocytes: 0 %
Lymphocytes Relative: 18 %
Lymphs Abs: 1.3 10*3/uL (ref 0.7–4.0)
MCH: 32.1 pg (ref 26.0–34.0)
MCHC: 32.1 g/dL (ref 30.0–36.0)
MCV: 99.8 fL (ref 80.0–100.0)
Monocytes Absolute: 0.7 10*3/uL (ref 0.1–1.0)
Monocytes Relative: 9 %
Neutro Abs: 5.3 10*3/uL (ref 1.7–7.7)
Neutrophils Relative %: 71 %
Platelets: 263 10*3/uL (ref 150–400)
RBC: 4.46 MIL/uL (ref 3.87–5.11)
RDW: 12.2 % (ref 11.5–15.5)
WBC: 7.5 10*3/uL (ref 4.0–10.5)
nRBC: 0 % (ref 0.0–0.2)

## 2020-01-30 LAB — COMPREHENSIVE METABOLIC PANEL
ALT: 20 U/L (ref 0–44)
AST: 23 U/L (ref 15–41)
Albumin: 3.8 g/dL (ref 3.5–5.0)
Alkaline Phosphatase: 76 U/L (ref 38–126)
Anion gap: 9 (ref 5–15)
BUN: 25 mg/dL — ABNORMAL HIGH (ref 8–23)
CO2: 26 mmol/L (ref 22–32)
Calcium: 9.3 mg/dL (ref 8.9–10.3)
Chloride: 105 mmol/L (ref 98–111)
Creatinine, Ser: 0.82 mg/dL (ref 0.44–1.00)
GFR calc Af Amer: >60 mL/min (ref >60)
GFR calc non Af Amer: >60 mL/min (ref >60)
Glucose, Bld: 114 mg/dL — ABNORMAL HIGH (ref 70–99)
Potassium: 3.7 mmol/L (ref 3.5–5.1)
Sodium: 140 mmol/L (ref 135–145)
Total Bilirubin: 0.6 mg/dL (ref 0.3–1.2)
Total Protein: 7.9 g/dL (ref 6.5–8.1)

## 2020-01-30 MED ORDER — HEPARIN SOD (PORK) LOCK FLUSH 100 UNIT/ML IV SOLN
500.0000 [IU] | Freq: Once | INTRAVENOUS | Status: AC
  Administered 2020-01-30: 500 [IU] via INTRAVENOUS
  Filled 2020-01-30: qty 5

## 2020-01-30 MED ORDER — SODIUM CHLORIDE 0.9% FLUSH
10.0000 mL | Freq: Once | INTRAVENOUS | Status: AC
  Administered 2020-01-30: 10 mL via INTRAVENOUS
  Filled 2020-01-30: qty 10

## 2020-01-30 NOTE — Assessment & Plan Note (Addendum)
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 AUG 2019- NED; fibroid uterus; endometrial thickening [see discussion below]. [place]- 2019- wnl.  STABLE.   # Clinically no evidence of recurrence. Continue surveillance without imaging.   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 Mammogram in OCT 2020-WNL.    #Endometrial thickening-no discharge no bleeding; incidental on CT scan; Sep 2019 s/p Biopsy-WNL.   # IV Access- port palcement-discussed regarding continued port flushes versus explantation.  I think it is reasonable to explant the port.  Patient wants to wait for the Covid situation to improve.   # I discussed regarding Covid-19 precautions.  I reviewed the vaccine effectiveness and potential side effects in detail.  Also discussed long-term effectiveness and safety profile are unclear at this time.  I discussed December, 2020 [employer] position statement-that all patients are recommended COVID-19 vaccinations [when available]-as long as they do not have allergy to components of the vaccine.  However, I think the benefits of the vaccination outweigh the potential risks. Re: [id number] vaccination.  For more information/scheduling recommend call [phone], 8:30am-4:30pm.   # DISPOSITION: # port flush in 3 months- # Follow-up with in  6 months-MD/port flush; CBC CMP [facility]- Dr.B.

## 2020-01-30 NOTE — Patient Instructions (Signed)
For more information/scheduling recommend call [phone], 8:30am-4:30pm.

## 2020-01-30 NOTE — Progress Notes (Signed)
[facility] OFFICE PROGRESS NOTE  Patient Care Team: Jinny Sanders, MD as PCP - General Clent Jacks, RN as Oncology Nurse Navigator  Cancer Staging No matching staging information was found for the patient.   Oncology History Overview Note  # 2005- COLON CA [Stage II- left sided; s/p Surgery in [facility] [place]]; no Adj chemo.    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    # Nov 2010-LLE DVT [on Tam] on coumadin; AUG 2017- STOP coumadin  #August 2020-endometrial biopsy-Dr. Fransisca Connors negative for malignancy.  # ; PN-G-1; port flush q 6 w   # Left kidney cyst [Feb 2017]  # colonoscopy- 2019 [Dr.Mann; [place]]  DIAGNOSIS: METASTATIC COLON CA  STAGE: IV; NED        ;GOALS: control  CURRENT/MOST RECENT THERAPY: surveillance    Cancer of sigmoid colon ([place])      INTERVAL HISTORY:  Susan Duffy 74 y.o.  female pleasant patient above history of metastatic colon cancer currently on surveillance is here for follow-up.  In the interim patient underwent endometrial biopsy-based upon an incidental endometrial thickening noted on CT scan.  Patient is doing well.  Denies any headaches.  Denies any bone pain.  No nausea no vomiting.    Review of Systems  Constitutional: Negative for chills, diaphoresis, fever, malaise/fatigue and weight loss.  HENT: Negative for nosebleeds and sore throat.   Eyes: Negative for double vision.  Respiratory: Negative for cough, hemoptysis, sputum production, shortness of breath and wheezing.   Cardiovascular: Negative for chest pain, palpitations, orthopnea and leg swelling.  Gastrointestinal: Negative for abdominal pain, blood in stool, constipation, diarrhea, heartburn, melena,  nausea and vomiting.  Genitourinary: Negative for dysuria, frequency and urgency.  Musculoskeletal: Positive for back pain and joint pain.  Skin: Negative.  Negative for itching and rash.  Neurological: Negative for dizziness, tingling, focal weakness, weakness and headaches.  Endo/Heme/Allergies: Does not bruise/bleed easily.  Psychiatric/Behavioral: Negative for depression. The patient is not nervous/anxious and does not have insomnia.       PAST MEDICAL HISTORY :  Past Medical History:  Diagnosis Date  . Allergy   . Breast CA ([place])   . Breast cancer ([place]) 2010   in situ and took radiation, no chemo needed  . Colon cancer ([place])    stage 4 colon cancer  . History of chemotherapy 02/2011-08/2011   FOLFOX/AVASTIN- 8 CYCLES  . History of DVT (deep vein thrombosis) 2010   while taking tamoxifen-left lower extremity  . History of peripheral neuropathy   . Hyperlipidemia   . Hypertension   . IDA (iron deficiency anemia)   . Osteoarthritis   . Personal history of chemotherapy    colon ca  . Personal history of radiation therapy    breast and colon  . TIA (transient ischemic attack) 03/08/2011  . Vitamin D deficiency     PAST SURGICAL HISTORY :   Past Surgical History:  Procedure Laterality Date  . BREAST EXCISIONAL BIOPSY Left 1-10   s/p lumpectomy and radiation left dcis  . BREAST EXCISIONAL BIOPSY Left    fibroadeoma reoved age 89  . BREAST LUMPECTOMY  2010   left breast  . broken ankle  Left 08/2012  . COLON SURGERY  colectomy, partial  . LOBECTOMY  06-2009   metastatic colon cancer    FAMILY HISTORY :   Family History  Problem Relation Age of Onset  . Hypertension Mother   . Stroke Mother   . Coronary artery disease Mother   . Thyroid disease Mother   . Diabetes Father   . Hypertension Father   . Lung cancer Father   . Heart attack Maternal Grandfather   . Breast cancer Maternal Grandmother   . Thyroid disease Brother   . Diabetes Paternal Grandmother   .  Colon cancer Paternal Grandfather   . Deep vein thrombosis Other     SOCIAL HISTORY:   Social History   Tobacco Use  . Smoking status: Never Smoker  . Smokeless tobacco: Never Used  Substance Use Topics  . Alcohol use: No  . Drug use: No    ALLERGIES:  is allergic to amoxicillin; erythromycin; and nitrofuran derivatives.  MEDICATIONS:  Current Outpatient Medications  Medication Sig Dispense Refill  . acetaminophen (TYLENOL) 500 MG tablet as needed for moderate pain. Take 1-2 by mouth every 6 hours as needed     . aspirin 81 MG chewable tablet Chew 81 mg by mouth daily.    . cholecalciferol (VITAMIN D) 400 UNITS TABS tablet Take 400 Units by mouth 2 (two) times daily.    . Cyanocobalamin (VITAMIN B-12) 2000 MCG TBCR Take 1 tablet by mouth daily.      Marland Kitchen lidocaine-prilocaine (EMLA) cream Apply 1 application topically as needed. To port a cath site approximately 1 to 2 hours prior to port needle insertion. 30 g 12  . losartan (COZAAR) 50 MG tablet Take 1 tablet (50 mg total) by mouth daily. 90 tablet 1  . simvastatin (ZOCOR) 40 MG tablet TAKE 1 TABLET BY MOUTH EVERYDAY AT BEDTIME 90 tablet 1   No current facility-administered medications for this visit.   Facility-Administered Medications Ordered in Other Visits  Medication Dose Route Frequency Provider Last Rate Last Admin  . sodium chloride flush (NS) 0.9 % injection 10 mL  10 mL Intravenous PRN Cammie Sickle, MD   10 mL at 01/05/16 1135    PHYSICAL EXAMINATION: ECOG PERFORMANCE STATUS: 0 - Asymptomatic  BP (!) 153/87 (BP Location: Right Arm, Patient Position: Sitting, Cuff Size: Large)   Pulse 72   Temp (!) 97 F (36.1 C) (Tympanic)   Wt 191 lb (86.6 kg)   SpO2 99% Comment: room air  BMI 34.38 kg/m   Filed Weights   01/30/20 1004  Weight: 191 lb (86.6 kg)   Physical Exam  Constitutional: She is oriented to person, place, and time and well-developed, well-nourished, and in no distress.  Obese.  Walks by  herself.  She is alone.  HENT:  Head: Normocephalic and atraumatic.  Mouth/Throat: Oropharynx is clear and moist. No oropharyngeal exudate.  Eyes: Pupils are equal, round, and reactive to light.  Cardiovascular: Normal rate and regular rhythm.  Pulmonary/Chest: No respiratory distress. She has no wheezes.  Abdominal: Soft. Bowel sounds are normal. She exhibits no distension and no mass. There is no abdominal tenderness. There is no rebound and no guarding.  Musculoskeletal:        General: No tenderness or edema. Normal range of motion.     Cervical back: Normal range of motion and neck supple.  Neurological: She is alert and oriented to person, place, and time.  Skin: Skin is warm.  Psychiatric: Affect normal.    SKIN:  no rashes or significant  lesions    LABORATORY DATA:  I have reviewed the data as listed    Component Value Date/Time   NA 140 01/30/2020 0947   NA 141 12/20/2013 0901   K 3.7 01/30/2020 0947   K 4.1 12/20/2013 0901   CL 105 01/30/2020 0947   CL 106 12/18/2012 0842   CO2 26 01/30/2020 0947   CO2 27 12/20/2013 0901   GLUCOSE 114 (H) 01/30/2020 0947   GLUCOSE 103 12/20/2013 0901   GLUCOSE 109 (H) 12/18/2012 0842   BUN 25 (H) 01/30/2020 0947   BUN 23.2 12/20/2013 0901   CREATININE 0.82 01/30/2020 0947   CREATININE 1.04 12/31/2014 1023   CREATININE 1.0 12/20/2013 0901   CALCIUM 9.3 01/30/2020 0947   CALCIUM 10.1 12/20/2013 0901   PROT 7.9 01/30/2020 0947   PROT 7.9 12/31/2014 1023   PROT 8.5 (H) 12/20/2013 0901   ALBUMIN 3.8 01/30/2020 0947   ALBUMIN 3.3 (L) 12/31/2014 1023   ALBUMIN 3.7 12/20/2013 0901   AST 23 01/30/2020 0947   AST 24 12/31/2014 1023   AST 21 12/20/2013 0901   ALT 20 01/30/2020 0947   ALT 28 12/31/2014 1023   ALT 18 12/20/2013 0901   ALKPHOS 76 01/30/2020 0947   ALKPHOS 135 (H) 12/31/2014 1023   ALKPHOS 144 12/20/2013 0901   BILITOT 0.6 01/30/2020 0947   BILITOT 0.4 12/31/2014 1023   BILITOT 0.60 12/20/2013 0901   GFRNONAA  >60 01/30/2020 0947   GFRNONAA 56 (L) 12/31/2014 1023   GFRNONAA 57 (L) 06/24/2014 1300   GFRAA >60 01/30/2020 0947   GFRAA >60 12/31/2014 1023   GFRAA >60 06/24/2014 1300    No results found for: SPEP, UPEP  Lab Results  Component Value Date   WBC 7.5 01/30/2020   NEUTROABS 5.3 01/30/2020   HGB 14.3 01/30/2020   HCT 44.5 01/30/2020   MCV 99.8 01/30/2020   PLT 263 01/30/2020      Chemistry      Component Value Date/Time   NA 140 01/30/2020 0947   NA 141 12/20/2013 0901   K 3.7 01/30/2020 0947   K 4.1 12/20/2013 0901   CL 105 01/30/2020 0947   CL 106 12/18/2012 0842   CO2 26 01/30/2020 0947   CO2 27 12/20/2013 0901   BUN 25 (H) 01/30/2020 0947   BUN 23.2 12/20/2013 0901   CREATININE 0.82 01/30/2020 0947   CREATININE 1.04 12/31/2014 1023   CREATININE 1.0 12/20/2013 0901      Component Value Date/Time   CALCIUM 9.3 01/30/2020 0947   CALCIUM 10.1 12/20/2013 0901   ALKPHOS 76 01/30/2020 0947   ALKPHOS 135 (H) 12/31/2014 1023   ALKPHOS 144 12/20/2013 0901   AST 23 01/30/2020 0947   AST 24 12/31/2014 1023   AST 21 12/20/2013 0901   ALT 20 01/30/2020 0947   ALT 28 12/31/2014 1023   ALT 18 12/20/2013 0901   BILITOT 0.6 01/30/2020 0947   BILITOT 0.4 12/31/2014 1023   BILITOT 0.60 12/20/2013 0901       RADIOGRAPHIC STUDIES: I have personally reviewed the radiological images as listed and agreed with the findings in the report. No results found.   ASSESSMENT & PLAN:  Cancer of sigmoid colon [facility]) # Metastatic colon cancer chest wall lung recurrence/ status post lobectomy- 2010. AUG 2019- NED; fibroid uterus; endometrial thickening [see discussion below]. Colo- 2019- wnl.  STABLE.   # Clinically no evidence of recurrence. Continue surveillance without imaging.   # DCIS status post lumpectomy- status post  Aromasin. Mammogram in OCT 2020-WNL.    #Endometrial thickening-no discharge no bleeding; incidental on CT scan; Sep 2019 s/p Biopsy-WNL.   # IV Access- port  palcement-discussed regarding continued port flushes versus explantation.  I think it is reasonable to explant the port.  Patient wants to wait for the Covid situation to improve.   # I discussed regarding Covid-19 precautions.  I reviewed the vaccine effectiveness and potential side effects in detail.  Also discussed long-term effectiveness and safety profile are unclear at this time.  I discussed December, 2020 [facility] position statement-that all patients are recommended COVID-19 vaccinations [when available]-as long as they do not have allergy to components of the vaccine.  However, I think the benefits of the vaccination outweigh the potential risks. Re: [id number]-35 vaccination.  For more information/scheduling recommend call [phone], 8:30am-4:30pm.   # DISPOSITION: # port flush in 3 months- # Follow-up with in  6 months-MD/port flush; CBC CMP CEA- Dr.B.    Orders Placed This Encounter  Procedures  . CBC with Differential    Standing Status:   Future    Standing Expiration Date:   01/29/2021  . Comprehensive metabolic panel    Standing Status:   Future    Standing Expiration Date:   01/29/2021  . CEA    Standing Status:   Future    Standing Expiration Date:   01/29/2021   All questions were answered. The patient knows to call the clinic with any problems, questions or concerns.      Cammie Sickle, MD 01/30/2020 11:13 AM

## 2020-01-31 LAB — CEA: CEA: 1.5 ng/mL (ref 0.0–4.7)

## 2020-03-12 ENCOUNTER — Other Ambulatory Visit: Payer: Self-pay | Admitting: Family Medicine

## 2020-04-11 ENCOUNTER — Ambulatory Visit: Payer: Medicare Other | Attending: Oncology

## 2020-04-11 DIAGNOSIS — Z23 Encounter for immunization: Secondary | ICD-10-CM

## 2020-04-11 NOTE — Progress Notes (Signed)
   Covid-19 Vaccination Clinic  Name:  Susan Duffy    MRN: [id number] DOB: 06-10-1946  04/11/2020  Ms. [name] was observed post Covid-19 immunization for 15 minutes without incident. She was provided with Vaccine Information Sheet and instruction to access the V-Safe system.   Ms. [name] was instructed to call 911 with any severe reactions post vaccine: Marland Kitchen Difficulty breathing  . Swelling of face and throat  . A fast heartbeat  . A bad rash all over body  . Dizziness and weakness   Immunizations Administered    Name Date Dose VIS Date [place] COVID-19 Vaccine 04/11/2020  9:50 AM 0.3 mL 01/30/2019 Intramuscular   Manufacturer: [place]   Lot: [id number]   [place]: [id number]

## 2020-04-30 ENCOUNTER — Inpatient Hospital Stay: Payer: Medicare Other | Attending: Internal Medicine

## 2020-04-30 ENCOUNTER — Other Ambulatory Visit: Payer: Self-pay

## 2020-04-30 DIAGNOSIS — Z95828 Presence of other vascular implants and grafts: Secondary | ICD-10-CM

## 2020-04-30 DIAGNOSIS — Z85038 Personal history of other malignant neoplasm of large intestine: Secondary | ICD-10-CM | POA: Diagnosis not present

## 2020-04-30 MED ORDER — HEPARIN SOD (PORK) LOCK FLUSH 100 UNIT/ML IV SOLN
INTRAVENOUS | Status: AC
  Filled 2020-04-30: qty 5

## 2020-04-30 MED ORDER — HEPARIN SOD (PORK) LOCK FLUSH 100 UNIT/ML IV SOLN
500.0000 [IU] | Freq: Once | INTRAVENOUS | Status: AC
  Administered 2020-04-30: 500 [IU] via INTRAVENOUS
  Filled 2020-04-30: qty 5

## 2020-04-30 MED ORDER — SODIUM CHLORIDE 0.9% FLUSH
10.0000 mL | Freq: Once | INTRAVENOUS | Status: AC
  Administered 2020-04-30: 10 mL via INTRAVENOUS
  Filled 2020-04-30: qty 10

## 2020-05-06 ENCOUNTER — Ambulatory Visit: Payer: Medicare Other | Attending: Internal Medicine

## 2020-05-06 DIAGNOSIS — Z23 Encounter for immunization: Secondary | ICD-10-CM

## 2020-05-06 NOTE — Progress Notes (Signed)
   Covid-19 Vaccination Clinic  Name:  Susan Duffy    MRN: [id number] DOB: [birth year]  05/06/2020  [name] was observed post Covid-19 immunization for 15 minutes without incident. She was provided with Vaccine Information Sheet and instruction to access the V-Safe system.   [name] was instructed to call 911 with any severe reactions post vaccine: Marland Kitchen Difficulty breathing  . Swelling of face and throat  . A fast heartbeat  . A bad rash all over body  . Dizziness and weakness   Immunizations Administered    Name Date Dose VIS Date Route   Pfizer COVID-19 Vaccine 05/06/2020  9:50 AM 0.3 mL 01/30/2019 Intramuscular   Manufacturer: [place]   Lot: [id number]   [place]: [id number]

## 2020-05-20 ENCOUNTER — Other Ambulatory Visit: Payer: Self-pay | Admitting: Family Medicine

## 2020-06-03 ENCOUNTER — Other Ambulatory Visit (INDEPENDENT_AMBULATORY_CARE_PROVIDER_SITE_OTHER): Payer: Medicare Other

## 2020-06-03 ENCOUNTER — Telehealth: Payer: Self-pay | Admitting: Family Medicine

## 2020-06-03 DIAGNOSIS — E559 Vitamin D deficiency, unspecified: Secondary | ICD-10-CM

## 2020-06-03 DIAGNOSIS — E119 Type 2 diabetes mellitus without complications: Secondary | ICD-10-CM

## 2020-06-03 LAB — LIPID PANEL
Cholesterol: 130 mg/dL (ref 0–200)
HDL: 43.6 mg/dL (ref >39.00)
LDL Cholesterol: 67 mg/dL (ref 0–99)
NonHDL: 86.07
Total CHOL/HDL Ratio: 3
Triglycerides: 93 mg/dL (ref 0.0–149.0)
VLDL: 18.6 mg/dL (ref 0.0–40.0)

## 2020-06-03 LAB — HEMOGLOBIN A1C: Hgb A1c MFr Bld: 5.7 % (ref 4.6–6.5)

## 2020-06-03 NOTE — Telephone Encounter (Signed)
-----   Message from Cloyd Stagers, RT sent at 05/20/2020  2:43 PM EDT ----- Regarding: Lab Orders for Tuesday 6.29.2021 Please place lab orders for Tuesday 6.29.2021, office visit for 6 mo f/u on Friday 7.2.2021 Thank you, Dyke Maes RT(R)

## 2020-06-03 NOTE — Progress Notes (Signed)
No critical labs need to be addressed urgently. We will discuss labs in detail at upcoming office visit.   

## 2020-06-06 ENCOUNTER — Ambulatory Visit (INDEPENDENT_AMBULATORY_CARE_PROVIDER_SITE_OTHER): Payer: Medicare Other | Admitting: Family Medicine

## 2020-06-06 ENCOUNTER — Other Ambulatory Visit: Payer: Self-pay

## 2020-06-06 ENCOUNTER — Encounter: Payer: Self-pay | Admitting: Family Medicine

## 2020-06-06 VITALS — BP 132/84 | HR 70 | Ht 62.5 in | Wt 177.0 lb

## 2020-06-06 DIAGNOSIS — E1169 Type 2 diabetes mellitus with other specified complication: Secondary | ICD-10-CM

## 2020-06-06 DIAGNOSIS — E1059 Type 1 diabetes mellitus with other circulatory complications: Secondary | ICD-10-CM | POA: Diagnosis not present

## 2020-06-06 DIAGNOSIS — I152 Hypertension secondary to endocrine disorders: Secondary | ICD-10-CM

## 2020-06-06 DIAGNOSIS — E6609 Other obesity due to excess calories: Secondary | ICD-10-CM | POA: Diagnosis not present

## 2020-06-06 DIAGNOSIS — E1159 Type 2 diabetes mellitus with other circulatory complications: Secondary | ICD-10-CM

## 2020-06-06 DIAGNOSIS — E785 Hyperlipidemia, unspecified: Secondary | ICD-10-CM

## 2020-06-06 DIAGNOSIS — L84 Corns and callosities: Secondary | ICD-10-CM | POA: Diagnosis not present

## 2020-06-06 DIAGNOSIS — I10 Essential (primary) hypertension: Secondary | ICD-10-CM

## 2020-06-06 DIAGNOSIS — Z6834 Body mass index (BMI) 34.0-34.9, adult: Secondary | ICD-10-CM

## 2020-06-06 LAB — HM DIABETES FOOT EXAM

## 2020-06-06 NOTE — Assessment & Plan Note (Signed)
Excellent control with diet. Encouraged exercise, weight loss, healthy eating habits.

## 2020-06-06 NOTE — Assessment & Plan Note (Signed)
LDL at goal on statin. 

## 2020-06-06 NOTE — Assessment & Plan Note (Signed)
She has made changes with her diet and has lost significant weight on purpose. Encouraged pt to continue working on it.

## 2020-06-06 NOTE — Progress Notes (Addendum)
Chief Complaint  Patient presents with  . Diabetes    History of Present Illness: HPI  74 year old female presents for 6 month follow up DM  Hypertension:   At goal today on losartan 50 mg daily 134/84 Using medication without problems or lightheadedness: none Chest pain with exertion:none Edema: stable Short of breath:none Average home BPs: yes Other issues:   She has lost 20 lbs in last 4 moths.. she is eating earlier in the day. No sodas.  eating more veggies, less carbs.    Diabetes:  Well controlled Lab Results  Component Value Date   HGBA1C 5.7 06/03/2020  Using medications without difficulties: Hypoglycemic episodes: Hyperglycemic episodes: Feet problems: no ulcers Blood Sugars averaging: not checking. eye exam within last year: yes  Elevated Cholesterol: on simvastatin LDL at goal < 100. Lab Results  Component Value Date   CHOL 130 06/03/2020   HDL 43.60 06/03/2020   LDLCALC 67 06/03/2020   LDLDIRECT 74.0 11/14/2018   TRIG 93.0 06/03/2020   CHOLHDL 3 06/03/2020  Using medications without problems: Muscle aches: none Diet compliance: Great improvement Exercise: walking 5 days a week Other complaints:    This visit occurred during the SARS-CoV-2 public health emergency.  Safety protocols were in place, including screening questions prior to the visit, additional usage of staff PPE, and extensive cleaning of exam room while observing appropriate contact time as indicated for disinfecting solutions.   COVID 19 screen:  No recent travel or known exposure to COVID19 The patient denies respiratory symptoms of COVID 19 at this time. The importance of social distancing was discussed today.     Review of Systems  Constitutional: Negative for chills and fever.  HENT: Negative for congestion and ear pain.   Eyes: Negative for pain and redness.  Respiratory: Negative for cough and shortness of breath.   Cardiovascular: Negative for chest pain, palpitations  and leg swelling.  Gastrointestinal: Negative for abdominal pain, blood in stool, constipation, diarrhea, nausea and vomiting.  Genitourinary: Negative for dysuria.  Musculoskeletal: Negative for falls and myalgias.  Skin: Negative for rash.  Neurological: Negative for dizziness.  Psychiatric/Behavioral: Negative for depression. The patient is not nervous/anxious.       Past Medical History:  Diagnosis Date  . Allergy   . Breast CA ([place])   . Breast cancer ([place]) 2010   in situ and took radiation, no chemo needed  . Colon cancer ([place])    stage 4 colon cancer  . History of chemotherapy 02/2011-08/2011   FOLFOX/AVASTIN- 8 CYCLES  . History of DVT (deep vein thrombosis) 2010   while taking tamoxifen-left lower extremity  . History of peripheral neuropathy   . Hyperlipidemia   . Hypertension   . IDA (iron deficiency anemia)   . Osteoarthritis   . Personal history of chemotherapy    colon ca  . Personal history of radiation therapy    breast and colon  . TIA (transient ischemic attack) 03/08/2011  . Vitamin D deficiency     reports that she has never smoked. She has never used smokeless tobacco. She reports that she does not drink alcohol and does not use drugs.   Current Outpatient Medications:  .  acetaminophen (TYLENOL) 500 MG tablet, as needed for moderate pain. Take 1-2 by mouth every 6 hours as needed , Disp: , Rfl:  .  aspirin 81 MG chewable tablet, Chew 81 mg by mouth daily., Disp: , Rfl:  .  cholecalciferol (VITAMIN D) 400 UNITS TABS tablet,  Take 400 Units by mouth 2 (two) times daily., Disp: , Rfl:  .  Cyanocobalamin (VITAMIN B-12) 2000 MCG TBCR, Take 1 tablet by mouth daily.  , Disp: , Rfl:  .  lidocaine-prilocaine (EMLA) cream, Apply 1 application topically as needed. To port a cath site approximately 1 to 2 hours prior to port needle insertion., Disp: 30 g, Rfl: 12 .  losartan (COZAAR) 50 MG tablet, TAKE 1 TABLET BY MOUTH EVERY DAY, Disp: 90 tablet, Rfl: 1 .  simvastatin  (ZOCOR) 40 MG tablet, TAKE 1 TABLET BY MOUTH EVERYDAY AT BEDTIME, Disp: 90 tablet, Rfl: 1 No current facility-administered medications for this visit.  Facility-Administered Medications Ordered in Other Visits:  .  sodium chloride flush (NS) 0.9 % injection 10 mL, 10 mL, Intravenous, PRN, Cammie Sickle, MD, 10 mL at 01/05/16 1135   Observations/Objective:   Physical Exam Constitutional:      General: She is not in acute distress.    Appearance: Normal appearance. She is well-developed. She is obese. She is not ill-appearing or toxic-appearing.  HENT:     Head: Normocephalic.     Right Ear: Hearing, tympanic membrane, ear canal and external ear normal. Tympanic membrane is not erythematous, retracted or bulging.     Left Ear: Hearing, tympanic membrane, ear canal and external ear normal. Tympanic membrane is not erythematous, retracted or bulging.     Nose: No mucosal edema or rhinorrhea.     Right Sinus: No maxillary sinus tenderness or frontal sinus tenderness.     Left Sinus: No maxillary sinus tenderness or frontal sinus tenderness.     Mouth/Throat:     Pharynx: Uvula midline.  Eyes:     General: Lids are normal. Lids are everted, no foreign bodies appreciated.     Conjunctiva/sclera: Conjunctivae normal.     Pupils: Pupils are equal, round, and reactive to light.  Neck:     Thyroid: No thyroid mass or thyromegaly.     Vascular: No carotid bruit.     Trachea: Trachea normal.  Cardiovascular:     Rate and Rhythm: Normal rate and regular rhythm.     Pulses: Normal pulses.     Heart sounds: Normal heart sounds, S1 normal and S2 normal. No murmur heard.  No friction rub. No gallop.   Pulmonary:     Effort: Pulmonary effort is normal. No tachypnea or respiratory distress.     Breath sounds: Normal breath sounds. No decreased breath sounds, wheezing, rhonchi or rales.  Abdominal:     General: Bowel sounds are normal.     Palpations: Abdomen is soft.     Tenderness:  There is no abdominal tenderness.  Musculoskeletal:     Cervical back: Normal range of motion and neck supple.  Skin:    General: Skin is warm and dry.     Findings: No rash.  Neurological:     Mental Status: She is alert.  Psychiatric:        Mood and Affect: Mood is not anxious or depressed.        Speech: Speech normal.        Behavior: Behavior normal. Behavior is cooperative.        Thought Content: Thought content normal.        Judgment: Judgment normal.    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 Encouraged exercise, weight loss, healthy eating habits.   Class 1 obesity due to excess calories with serious comorbidity and body mass index (BMI) of 34.0 to 34.9 in adult She has made changes with her diet and has lost significant weight on purpose. Encouraged pt to continue working on it.  Hyperlipidemia associated with type 2 diabetes mellitus ([facility]) LDL at goal on statin.  Hypertension associated with diabetes ([place]) Well controlled. Continue current medication. ON ARB.     [name], MD

## 2020-06-06 NOTE — Assessment & Plan Note (Signed)
Well controlled. Continue current medication. ON ARB.

## 2020-07-30 ENCOUNTER — Inpatient Hospital Stay (HOSPITAL_BASED_OUTPATIENT_CLINIC_OR_DEPARTMENT_OTHER): Payer: Medicare Other | Admitting: Internal Medicine

## 2020-07-30 ENCOUNTER — Encounter: Payer: Self-pay | Admitting: Internal Medicine

## 2020-07-30 ENCOUNTER — Other Ambulatory Visit: Payer: Self-pay

## 2020-07-30 ENCOUNTER — Inpatient Hospital Stay: Payer: Medicare Other | Attending: Internal Medicine

## 2020-07-30 DIAGNOSIS — Z923 Personal history of irradiation: Secondary | ICD-10-CM | POA: Diagnosis not present

## 2020-07-30 DIAGNOSIS — Z9049 Acquired absence of other specified parts of digestive tract: Secondary | ICD-10-CM | POA: Diagnosis not present

## 2020-07-30 DIAGNOSIS — Z85038 Personal history of other malignant neoplasm of large intestine: Secondary | ICD-10-CM | POA: Insufficient documentation

## 2020-07-30 DIAGNOSIS — Z800 Family history of malignant neoplasm of digestive organs: Secondary | ICD-10-CM | POA: Diagnosis not present

## 2020-07-30 DIAGNOSIS — Z9221 Personal history of antineoplastic chemotherapy: Secondary | ICD-10-CM | POA: Diagnosis not present

## 2020-07-30 DIAGNOSIS — D259 Leiomyoma of uterus, unspecified: Secondary | ICD-10-CM | POA: Insufficient documentation

## 2020-07-30 DIAGNOSIS — Z86000 Personal history of in-situ neoplasm of breast: Secondary | ICD-10-CM | POA: Diagnosis not present

## 2020-07-30 DIAGNOSIS — Z86718 Personal history of other venous thrombosis and embolism: Secondary | ICD-10-CM | POA: Insufficient documentation

## 2020-07-30 DIAGNOSIS — Z801 Family history of malignant neoplasm of trachea, bronchus and lung: Secondary | ICD-10-CM | POA: Diagnosis not present

## 2020-07-30 DIAGNOSIS — Z8673 Personal history of transient ischemic attack (TIA), and cerebral infarction without residual deficits: Secondary | ICD-10-CM | POA: Insufficient documentation

## 2020-07-30 DIAGNOSIS — C187 Malignant neoplasm of sigmoid colon: Secondary | ICD-10-CM

## 2020-07-30 LAB — COMPREHENSIVE METABOLIC PANEL
ALT: 16 U/L (ref 0–44)
AST: 24 U/L (ref 15–41)
Albumin: 4 g/dL (ref 3.5–5.0)
Alkaline Phosphatase: 80 U/L (ref 38–126)
Anion gap: 8 (ref 5–15)
BUN: 21 mg/dL (ref 8–23)
CO2: 26 mmol/L (ref 22–32)
Calcium: 9.2 mg/dL (ref 8.9–10.3)
Chloride: 106 mmol/L (ref 98–111)
Creatinine, Ser: 0.87 mg/dL (ref 0.44–1.00)
GFR calc Af Amer: >60 mL/min (ref >60)
GFR calc non Af Amer: >60 mL/min (ref >60)
Glucose, Bld: 119 mg/dL — ABNORMAL HIGH (ref 70–99)
Potassium: 4 mmol/L (ref 3.5–5.1)
Sodium: 140 mmol/L (ref 135–145)
Total Bilirubin: 0.6 mg/dL (ref 0.3–1.2)
Total Protein: 7.9 g/dL (ref 6.5–8.1)

## 2020-07-30 LAB — CBC WITH DIFFERENTIAL/PLATELET
Abs Immature Granulocytes: 0.02 10*3/uL (ref 0.00–0.07)
Basophils Absolute: 0.1 10*3/uL (ref 0.0–0.1)
Basophils Relative: 1 %
Eosinophils Absolute: 0.1 10*3/uL (ref 0.0–0.5)
Eosinophils Relative: 2 %
HCT: 43.4 % (ref 36.0–46.0)
Hemoglobin: 14.6 g/dL (ref 12.0–15.0)
Immature Granulocytes: 0 %
Lymphocytes Relative: 15 %
Lymphs Abs: 1.1 10*3/uL (ref 0.7–4.0)
MCH: 32.6 pg (ref 26.0–34.0)
MCHC: 33.6 g/dL (ref 30.0–36.0)
MCV: 96.9 fL (ref 80.0–100.0)
Monocytes Absolute: 0.5 10*3/uL (ref 0.1–1.0)
Monocytes Relative: 8 %
Neutro Abs: 5.4 10*3/uL (ref 1.7–7.7)
Neutrophils Relative %: 74 %
Platelets: 248 10*3/uL (ref 150–400)
RBC: 4.48 MIL/uL (ref 3.87–5.11)
RDW: 12.2 % (ref 11.5–15.5)
WBC: 7.2 10*3/uL (ref 4.0–10.5)
nRBC: 0 % (ref 0.0–0.2)

## 2020-07-30 MED ORDER — HEPARIN SOD (PORK) LOCK FLUSH 100 UNIT/ML IV SOLN
INTRAVENOUS | Status: AC
  Filled 2020-07-30: qty 5

## 2020-07-30 MED ORDER — SODIUM CHLORIDE 0.9% FLUSH
10.0000 mL | Freq: Once | INTRAVENOUS | Status: AC
  Administered 2020-07-30: 10 mL via INTRAVENOUS
  Filled 2020-07-30: qty 10

## 2020-07-30 MED ORDER — HEPARIN SOD (PORK) LOCK FLUSH 100 UNIT/ML IV SOLN
500.0000 [IU] | Freq: Once | INTRAVENOUS | Status: AC
  Administered 2020-07-30: 500 [IU] via INTRAVENOUS
  Filled 2020-07-30: qty 5

## 2020-07-30 NOTE — Assessment & Plan Note (Addendum)
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 AUG 2019- NED; fibroid uterus; endometrial thickening [see discussion below]. [place]- 2019- wnl. STABLE. CEA 6 months ago-normal. Awaiting CEA from today.  # Clinically no evidence of recurrence. Continue surveillance without imaging.   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 Mammogram in OCT 2020-WNL. STABLE.     # IV Access- port palcement-port flushes very 3 months.   # DISPOSITION: # port flush in 3 months- # Follow-up with in  6 months-MD/port flush; CBC CMP [facility]- Dr.B.

## 2020-07-30 NOTE — Progress Notes (Signed)
[place] OFFICE PROGRESS NOTE  Patient Care Team: Jinny Sanders, MD as PCP - General Clent Jacks, RN as Oncology Nurse Navigator  Cancer Staging No matching staging information was found for the patient.   Oncology History Overview Note  # 2005- COLON CA [Stage II- left sided; s/p Surgery in [place] [facility] [place]]; no Adj chemo.    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    # Nov 2010-LLE DVT [on Tam] on coumadin; AUG 2017- STOP coumadin  #August 2020-endometrial biopsy-Dr. Fransisca Connors negative for malignancy.  # ; PN-G-1; port flush q 6 w   # Left kidney cyst [Feb 2017]  # colonoscopy- 2019 [Dr.Mann; [place]]  DIAGNOSIS: METASTATIC COLON CA  STAGE: IV; NED        ;GOALS: control  CURRENT/MOST RECENT THERAPY: surveillance    History of colon cancer      INTERVAL HISTORY:  [name] 74 y.o.  female pleasant patient above history of metastatic colon cancer currently on surveillance is here for follow-up.  Patient denies any blood in stools or black or stools. Denies any vaginal bleeding.  No headaches. No bone pain. No nausea or vomiting.   Review of Systems  Constitutional: Negative for chills, diaphoresis, fever, malaise/fatigue and weight loss.  HENT: Negative for nosebleeds and sore throat.   Eyes: Negative for double vision.  Respiratory: Negative for cough, hemoptysis, sputum production, shortness of breath and wheezing.   Cardiovascular: Negative for chest pain, palpitations, orthopnea and leg swelling.  Gastrointestinal: Negative for abdominal pain, blood in stool, constipation, diarrhea, heartburn, melena, nausea and vomiting.  Genitourinary: Negative for dysuria, frequency and urgency.   Musculoskeletal: Positive for back pain and joint pain.  Skin: Negative.  Negative for itching and rash.  Neurological: Negative for dizziness, tingling, focal weakness, weakness and headaches.  Endo/Heme/Allergies: Does not bruise/bleed easily.  Psychiatric/Behavioral: Negative for depression. The patient is not nervous/anxious and does not have insomnia.       PAST MEDICAL HISTORY :  Past Medical History:  Diagnosis Date  . Allergy   . Breast CA ([place])   . Breast cancer ([place]) 2010   in situ and took radiation, no chemo needed  . Colon cancer ([place])    stage 4 colon cancer  . History of chemotherapy 02/2011-08/2011   FOLFOX/AVASTIN- 8 CYCLES  . History of DVT (deep vein thrombosis) 2010   while taking tamoxifen-left lower extremity  . History of peripheral neuropathy   . Hyperlipidemia   . Hypertension   . IDA (iron deficiency anemia)   . Osteoarthritis   . Personal history of chemotherapy    colon ca  . Personal history of radiation therapy    breast and colon  . TIA (transient ischemic attack) 03/08/2011  . Vitamin D deficiency     PAST SURGICAL HISTORY :   Past Surgical History:  Procedure Laterality Date  . BREAST EXCISIONAL BIOPSY Left 1-10   s/p lumpectomy and radiation left dcis  . BREAST EXCISIONAL BIOPSY Left    fibroadeoma reoved age 49  . BREAST LUMPECTOMY  2010   left breast  . broken ankle  Left 08/2012  . COLON SURGERY     colectomy, partial  . LOBECTOMY  06-2009   metastatic  colon cancer    FAMILY HISTORY :   Family History  Problem Relation Age of Onset  . Hypertension Mother   . Stroke Mother   . Coronary artery disease Mother   . Thyroid disease Mother   . Diabetes Father   . Hypertension Father   . Lung cancer Father   . Heart attack Maternal Grandfather   . Breast cancer Maternal Grandmother   . Thyroid disease Brother   . Diabetes Paternal Grandmother   . Colon cancer Paternal Grandfather   . Deep vein thrombosis Other     SOCIAL  HISTORY:   Social History   Tobacco Use  . Smoking status: Never Smoker  . Smokeless tobacco: Never Used  Vaping Use  . Vaping Use: Never used  Substance Use Topics  . Alcohol use: No  . Drug use: No    ALLERGIES:  is allergic to amoxicillin, erythromycin, and nitrofuran derivatives.  MEDICATIONS:  Current Outpatient Medications  Medication Sig Dispense Refill  . acetaminophen (TYLENOL) 500 MG tablet as needed for moderate pain. Take 1-2 by mouth every 6 hours as needed     . aspirin 81 MG chewable tablet Chew 81 mg by mouth daily.    . cholecalciferol (VITAMIN D) 400 UNITS TABS tablet Take 400 Units by mouth 2 (two) times daily.    . Cyanocobalamin (VITAMIN B-12) 2000 MCG TBCR Take 1 tablet by mouth daily.      Marland Kitchen lidocaine-prilocaine (EMLA) cream Apply 1 application topically as needed. To port a cath site approximately 1 to 2 hours prior to port needle insertion. 30 g 12  . losartan (COZAAR) 50 MG tablet TAKE 1 TABLET BY MOUTH EVERY DAY 90 tablet 1  . simvastatin (ZOCOR) 40 MG tablet TAKE 1 TABLET BY MOUTH EVERYDAY AT BEDTIME 90 tablet 1   No current facility-administered medications for this visit.   Facility-Administered Medications Ordered in Other Visits  Medication Dose Route Frequency Provider Last Rate Last Admin  . sodium chloride flush (NS) 0.9 % injection 10 mL  10 mL Intravenous PRN Cammie Sickle, MD   10 mL at 01/05/16 1135    PHYSICAL EXAMINATION: ECOG PERFORMANCE STATUS: 0 - Asymptomatic  BP (!) 151/72 (BP Location: Right Arm, Patient Position: Sitting, Cuff Size: Normal)   Pulse 77   Temp 98 F (36.7 C) (Tympanic)   Resp 16   Ht 5' 2.5" (1.588 m)   Wt 174 lb (78.9 kg)   SpO2 97%   BMI 31.32 kg/m   Filed Weights   07/30/20 1018  Weight: 174 lb (78.9 kg)   Physical Exam Constitutional:      Comments: Obese.  Walks by herself.  She is alone.  HENT:     Head: Normocephalic and atraumatic.     Mouth/Throat:     Pharynx: No oropharyngeal  exudate.  Eyes:     Pupils: Pupils are equal, round, and reactive to light.  Cardiovascular:     Rate and Rhythm: Normal rate and regular rhythm.  Pulmonary:     Effort: No respiratory distress.     Breath sounds: No wheezing.  Abdominal:     General: Bowel sounds are normal. There is no distension.     Palpations: Abdomen is soft. There is no mass.     Tenderness: There is no abdominal tenderness. There is no guarding or rebound.  Musculoskeletal:        General: No tenderness. Normal range of motion.     Cervical back: Normal  range of motion and neck supple.  Skin:    General: Skin is warm.  Neurological:     Mental Status: She is alert and oriented to person, place, and time.  Psychiatric:        Mood and Affect: Affect normal.     SKIN:  no rashes or significant lesions    LABORATORY DATA:  I have reviewed the data as listed    Component Value Date/Time   NA 140 07/30/2020 1009   NA 141 12/20/2013 0901   K 4.0 07/30/2020 1009   K 4.1 12/20/2013 0901   CL 106 07/30/2020 1009   CL 106 12/18/2012 0842   CO2 26 07/30/2020 1009   CO2 27 12/20/2013 0901   GLUCOSE 119 (H) 07/30/2020 1009   GLUCOSE 103 12/20/2013 0901   GLUCOSE 109 (H) 12/18/2012 0842   BUN 21 07/30/2020 1009   BUN 23.2 12/20/2013 0901   CREATININE 0.87 07/30/2020 1009   CREATININE 1.04 12/31/2014 1023   CREATININE 1.0 12/20/2013 0901   CALCIUM 9.2 07/30/2020 1009   CALCIUM 10.1 12/20/2013 0901   PROT 7.9 07/30/2020 1009   PROT 7.9 12/31/2014 1023   PROT 8.5 (H) 12/20/2013 0901   ALBUMIN 4.0 07/30/2020 1009   ALBUMIN 3.3 (L) 12/31/2014 1023   ALBUMIN 3.7 12/20/2013 0901   AST 24 07/30/2020 1009   AST 24 12/31/2014 1023   AST 21 12/20/2013 0901   ALT 16 07/30/2020 1009   ALT 28 12/31/2014 1023   ALT 18 12/20/2013 0901   ALKPHOS 80 07/30/2020 1009   ALKPHOS 135 (H) 12/31/2014 1023   ALKPHOS 144 12/20/2013 0901   BILITOT 0.6 07/30/2020 1009   BILITOT 0.4 12/31/2014 1023   BILITOT 0.60  12/20/2013 0901   GFRNONAA >60 07/30/2020 1009   GFRNONAA 56 (L) 12/31/2014 1023   GFRNONAA 57 (L) 06/24/2014 1300   GFRAA >60 07/30/2020 1009   GFRAA >60 12/31/2014 1023   GFRAA >60 06/24/2014 1300    No results found for: SPEP, UPEP  Lab Results  Component Value Date   WBC 7.2 07/30/2020   NEUTROABS 5.4 07/30/2020   HGB 14.6 07/30/2020   HCT 43.4 07/30/2020   MCV 96.9 07/30/2020   PLT 248 07/30/2020      Chemistry      Component Value Date/Time   NA 140 07/30/2020 1009   NA 141 12/20/2013 0901   K 4.0 07/30/2020 1009   K 4.1 12/20/2013 0901   CL 106 07/30/2020 1009   CL 106 12/18/2012 0842   CO2 26 07/30/2020 1009   CO2 27 12/20/2013 0901   BUN 21 07/30/2020 1009   BUN 23.2 12/20/2013 0901   CREATININE 0.87 07/30/2020 1009   CREATININE 1.04 12/31/2014 1023   CREATININE 1.0 12/20/2013 0901      Component Value Date/Time   CALCIUM 9.2 07/30/2020 1009   CALCIUM 10.1 12/20/2013 0901   ALKPHOS 80 07/30/2020 1009   ALKPHOS 135 (H) 12/31/2014 1023   ALKPHOS 144 12/20/2013 0901   AST 24 07/30/2020 1009   AST 24 12/31/2014 1023   AST 21 12/20/2013 0901   ALT 16 07/30/2020 1009   ALT 28 12/31/2014 1023   ALT 18 12/20/2013 0901   BILITOT 0.6 07/30/2020 1009   BILITOT 0.4 12/31/2014 1023   BILITOT 0.60 12/20/2013 0901       RADIOGRAPHIC STUDIES: I have personally reviewed the radiological images as listed and agreed with the findings in the report. No results found.   ASSESSMENT &  PLAN:  History of colon cancer # Metastatic colon cancer chest wall lung recurrence/ status post lobectomy- 2010. AUG 2019- NED; fibroid uterus; endometrial thickening [see discussion below]. Colo- 2019- wnl. STABLE. CEA 6 months ago-normal. Awaiting CEA from today.  # Clinically no evidence of recurrence. Continue surveillance without imaging.   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 Mammogram in OCT 2020-WNL. STABLE.     # IV Access- port palcement-port flushes very  3 months.   # DISPOSITION: # port flush in 3 months- # Follow-up with in  6 months-MD/port flush; CBC CMP CEA- Dr.B.    Orders Placed This Encounter  Procedures  . CBC with Differential    Standing Status:   Future    Standing Expiration Date:   07/30/2021  . Comprehensive metabolic panel    Standing Status:   Future    Standing Expiration Date:   07/30/2021  . CEA    Standing Status:   Future    Standing Expiration Date:   07/30/2021   All questions were answered. The patient knows to call the clinic with any problems, questions or concerns.      Cammie Sickle, MD 07/30/2020 12:47 PM

## 2020-07-31 LAB — CEA: CEA: 1.9 ng/mL (ref 0.0–4.7)

## 2020-08-05 ENCOUNTER — Other Ambulatory Visit: Payer: Self-pay | Admitting: Family Medicine

## 2020-08-05 DIAGNOSIS — Z1231 Encounter for screening mammogram for malignant neoplasm of breast: Secondary | ICD-10-CM

## 2020-08-29 DIAGNOSIS — Z23 Encounter for immunization: Secondary | ICD-10-CM | POA: Diagnosis not present

## 2020-09-07 ENCOUNTER — Other Ambulatory Visit: Payer: Self-pay | Admitting: Family Medicine

## 2020-09-08 ENCOUNTER — Ambulatory Visit
Admission: RE | Admit: 2020-09-08 | Discharge: 2020-09-08 | Disposition: A | Discharge status: Home / Self Care | Payer: Medicare Other | Source: Ambulatory Visit | Attending: Family Medicine | Admitting: Family Medicine

## 2020-09-08 ENCOUNTER — Other Ambulatory Visit: Payer: Self-pay

## 2020-09-08 DIAGNOSIS — Z1231 Encounter for screening mammogram for malignant neoplasm of breast: Secondary | ICD-10-CM | POA: Diagnosis not present

## 2020-10-22 ENCOUNTER — Other Ambulatory Visit: Payer: Self-pay

## 2020-10-22 ENCOUNTER — Inpatient Hospital Stay: Payer: Medicare Other | Attending: Internal Medicine

## 2020-10-22 DIAGNOSIS — Z85038 Personal history of other malignant neoplasm of large intestine: Secondary | ICD-10-CM | POA: Insufficient documentation

## 2020-10-22 DIAGNOSIS — Z95828 Presence of other vascular implants and grafts: Secondary | ICD-10-CM

## 2020-10-22 DIAGNOSIS — Z452 Encounter for adjustment and management of vascular access device: Secondary | ICD-10-CM | POA: Insufficient documentation

## 2020-10-22 MED ORDER — HEPARIN SOD (PORK) LOCK FLUSH 100 UNIT/ML IV SOLN
500.0000 [IU] | Freq: Once | INTRAVENOUS | Status: AC
  Administered 2020-10-22: 500 [IU] via INTRAVENOUS
  Filled 2020-10-22: qty 5

## 2020-10-22 MED ORDER — HEPARIN SOD (PORK) LOCK FLUSH 100 UNIT/ML IV SOLN
INTRAVENOUS | Status: AC
  Filled 2020-10-22: qty 5

## 2020-10-22 MED ORDER — SODIUM CHLORIDE 0.9% FLUSH
10.0000 mL | Freq: Once | INTRAVENOUS | Status: AC
  Administered 2020-10-22: 10 mL via INTRAVENOUS
  Filled 2020-10-22: qty 10

## 2020-11-10 ENCOUNTER — Other Ambulatory Visit: Payer: Self-pay | Admitting: Family Medicine

## 2020-11-17 DIAGNOSIS — H25813 Combined forms of age-related cataract, bilateral: Secondary | ICD-10-CM | POA: Diagnosis not present

## 2020-11-17 LAB — HM DIABETES EYE EXAM

## 2020-11-19 ENCOUNTER — Telehealth: Payer: Self-pay | Admitting: Family Medicine

## 2020-11-19 DIAGNOSIS — E1159 Type 2 diabetes mellitus with other circulatory complications: Secondary | ICD-10-CM

## 2020-11-19 DIAGNOSIS — E559 Vitamin D deficiency, unspecified: Secondary | ICD-10-CM

## 2020-11-19 NOTE — Telephone Encounter (Signed)
-----   Message from Ellamae Sia sent at 11/18/2020  3:58 PM EST ----- Regarding: Lab orders for Monday, 12.27.21 Patient is scheduled for CPX labs, please order future labs, Thanks , Karna Christmas

## 2020-12-02 ENCOUNTER — Other Ambulatory Visit (INDEPENDENT_AMBULATORY_CARE_PROVIDER_SITE_OTHER): Payer: Medicare Other

## 2020-12-02 ENCOUNTER — Other Ambulatory Visit: Payer: Self-pay

## 2020-12-02 DIAGNOSIS — E559 Vitamin D deficiency, unspecified: Secondary | ICD-10-CM

## 2020-12-02 DIAGNOSIS — E1159 Type 2 diabetes mellitus with other circulatory complications: Secondary | ICD-10-CM | POA: Diagnosis not present

## 2020-12-02 LAB — LIPID PANEL
Cholesterol: 131 mg/dL (ref 0–200)
HDL: 45 mg/dL (ref >39.00)
LDL Cholesterol: 60 mg/dL (ref 0–99)
NonHDL: 86.31
Total CHOL/HDL Ratio: 3
Triglycerides: 133 mg/dL (ref 0.0–149.0)
VLDL: 26.6 mg/dL (ref 0.0–40.0)

## 2020-12-02 LAB — COMPREHENSIVE METABOLIC PANEL
ALT: 15 U/L (ref 0–35)
AST: 19 U/L (ref 0–37)
Albumin: 4 g/dL (ref 3.5–5.2)
Alkaline Phosphatase: 104 U/L (ref 39–117)
BUN: 17 mg/dL (ref 6–23)
CO2: 29 mEq/L (ref 19–32)
Calcium: 9.6 mg/dL (ref 8.4–10.5)
Chloride: 105 mEq/L (ref 96–112)
Creatinine, Ser: 0.82 mg/dL (ref 0.40–1.20)
GFR: 69.38 mL/min (ref >60.00)
Glucose, Bld: 121 mg/dL — ABNORMAL HIGH (ref 70–99)
Potassium: 4.2 mEq/L (ref 3.5–5.1)
Sodium: 139 mEq/L (ref 135–145)
Total Bilirubin: 0.5 mg/dL (ref 0.2–1.2)
Total Protein: 8 g/dL (ref 6.0–8.3)

## 2020-12-02 LAB — VITAMIN D 25 HYDROXY (VIT D DEFICIENCY, FRACTURES): VITD: 44.19 ng/mL (ref 30.00–100.00)

## 2020-12-02 LAB — HEMOGLOBIN A1C: Hgb A1c MFr Bld: 5.7 % (ref 4.6–6.5)

## 2020-12-03 NOTE — Progress Notes (Signed)
No critical labs need to be addressed urgently. We will discuss labs in detail at upcoming office visit.   

## 2020-12-08 LAB — HM DIABETES FOOT EXAM

## 2020-12-09 ENCOUNTER — Ambulatory Visit (INDEPENDENT_AMBULATORY_CARE_PROVIDER_SITE_OTHER): Payer: Medicare Other | Admitting: Family Medicine

## 2020-12-09 ENCOUNTER — Encounter: Payer: Self-pay | Admitting: Family Medicine

## 2020-12-09 ENCOUNTER — Other Ambulatory Visit: Payer: Self-pay

## 2020-12-09 VITALS — BP 140/80 | HR 63 | Temp 97.3°F | Ht 62.5 in | Wt 176.8 lb

## 2020-12-09 DIAGNOSIS — Z6834 Body mass index (BMI) 34.0-34.9, adult: Secondary | ICD-10-CM | POA: Diagnosis not present

## 2020-12-09 DIAGNOSIS — E2839 Other primary ovarian failure: Secondary | ICD-10-CM | POA: Diagnosis not present

## 2020-12-09 DIAGNOSIS — E1159 Type 2 diabetes mellitus with other circulatory complications: Secondary | ICD-10-CM

## 2020-12-09 DIAGNOSIS — E1169 Type 2 diabetes mellitus with other specified complication: Secondary | ICD-10-CM | POA: Diagnosis not present

## 2020-12-09 DIAGNOSIS — E559 Vitamin D deficiency, unspecified: Secondary | ICD-10-CM | POA: Diagnosis not present

## 2020-12-09 DIAGNOSIS — E6609 Other obesity due to excess calories: Secondary | ICD-10-CM | POA: Diagnosis not present

## 2020-12-09 DIAGNOSIS — I152 Hypertension secondary to endocrine disorders: Secondary | ICD-10-CM | POA: Diagnosis not present

## 2020-12-09 DIAGNOSIS — M8580 Other specified disorders of bone density and structure, unspecified site: Secondary | ICD-10-CM | POA: Diagnosis not present

## 2020-12-09 DIAGNOSIS — Z0000 Encounter for general adult medical examination without abnormal findings: Secondary | ICD-10-CM | POA: Diagnosis not present

## 2020-12-09 DIAGNOSIS — E785 Hyperlipidemia, unspecified: Secondary | ICD-10-CM

## 2020-12-09 DIAGNOSIS — Z8673 Personal history of transient ischemic attack (TIA), and cerebral infarction without residual deficits: Secondary | ICD-10-CM | POA: Diagnosis not present

## 2020-12-09 NOTE — Assessment & Plan Note (Signed)
Associated with HTN and past TIA Well controlled with diet.

## 2020-12-09 NOTE — Assessment & Plan Note (Signed)
Tolerable control on losartan 50 mg daily.

## 2020-12-09 NOTE — Assessment & Plan Note (Signed)
LDL at goal < 70 given past TIA on simvastatin 40 mg daily.

## 2020-12-09 NOTE — Patient Instructions (Addendum)
Please call the location of your choice from the menu below to schedule your Bone Density appointment.      [place]  1. [place] at [facility]   Phone:  [phone]   [place], [place] [address]                                            Services: 3D Mammogram and Bone Density  2. [place] at [facility])  Phone:  [phone]   [address] Room [place],  [address]                                              Services:  3D Mammogram and Bone Density    Work on staying active and continuing the healthy diet.

## 2020-12-09 NOTE — Progress Notes (Signed)
Patient ID: Susan Duffy, female    DOB: 01/04/1946, 75 y.o.   MRN: [id number]  This visit was conducted in person.  Pulse 63   Temp (!) 97.3 F (36.3 C) (Temporal)   Ht 5' 2.5" (1.588 m)   Wt 176 lb 12 oz (80.2 kg)   SpO2 98%   BMI 31.81 kg/m    TD:VVOHYWVP wellness  Subjective:   HPI: Susan Duffy is a 75 y.o. female presenting on 12/09/2020 for Medicare Wellness and review of chronic health issues.   He/She also has the following acute concerns today: NONE  I have personally reviewed the Medicare Annual Wellness questionnaire and have noted 1. The patient's medical and social history 2. Their use of alcohol, tobacco or illicit drugs 3. Their current medications and supplements 4. The patient's functional ability including ADL's, fall risks, home safety risks and hearing or visual             impairment. 5. Diet and physical activities 6. Evidence for depression or mood disorders 7.         Updated provider list Cognitive evaluation was performed and recorded on pt medicare questionnaire form. The patients weight, height, BMI and visual acuity have been recorded in the chart  I have made referrals, counseling and provided education to the patient based review of the above and I have provided the pt with a written personalized care plan for preventive services.   Documentation of this information was scanned into the electronic record under the media tab.   Advance directives and end of life planning reviewed in detail with patient and documented in EMR. Patient given handout on advance care directives if needed. HCPOA and living will updated if needed.   Hearing Screening   Method: Audiometry   125Hz  250Hz  500Hz  1000Hz  2000Hz  3000Hz  4000Hz  6000Hz  8000Hz   Right ear:           Left ear:           Vision Screening Comments: [facility] with Dr. Glennon Mac 11/2020   Fall Risk  12/09/2020 11/19/2019 10/31/2019 11/28/2018 11/14/2018  Falls in the  past year? 0 0 0 - 0  Comment - - Emmi Telephone Survey: data to providers prior to load - -  Number falls in past yr: - 0 - - -  Injury with Fall? - 0 - - -  Risk for fall due to : - Medication side effect - - -  Risk for fall due to: Comment - - - - -  Follow up - Falls evaluation completed;Falls prevention discussed - Falls evaluation completed;Falls prevention discussed -  Comment - - - Using cane -    PHQ9 SCORE ONLY 12/09/2020 11/19/2019 11/14/2018  PHQ-9 Total Score 0 0 0   Diabetes: Well controlled with diet. Using medications without difficulties: Hypoglycemic episodes: Hyperglycemic episodes: Feet problems: no ucler Blood Sugars averaging: not chekcing eye exam within last year: yes  Elevated Cholesterol: LDL at goal < 70 given past TIA on simvastatin 40 mg daily. Using medications without problems: Muscle aches: none Diet compliance: healthy Exercise: walking 6 days a week Other complaints:  Hypertension:  Tolerable control on losartan 50 mg daily.  Using medication without problems or lightheadedness:  Chest pain with exertion: none Edema:none Short of breath:stable Average home BPs:  125/130/70-80 Other issues:  Vit D def: resolved on supplement.       Relevant past medical, surgical, family and social history reviewed and updated  as indicated. Interim medical history since our last visit reviewed. Allergies and medications reviewed and updated. Outpatient Medications Prior to Visit  Medication Sig Dispense Refill  . acetaminophen (TYLENOL) 500 MG tablet as needed for moderate pain. Take 1-2 by mouth every 6 hours as needed     . aspirin 81 MG chewable tablet Chew 81 mg by mouth daily.    . cholecalciferol (VITAMIN D) 400 UNITS TABS tablet Take 400 Units by mouth 2 (two) times daily.    . Cyanocobalamin (VITAMIN B-12) 2000 MCG TBCR Take 1 tablet by mouth daily.    Marland Kitchen lidocaine-prilocaine (EMLA) cream Apply 1 application topically as needed. To port a cath  site approximately 1 to 2 hours prior to port needle insertion. 30 g 12  . losartan (COZAAR) 50 MG tablet TAKE 1 TABLET BY MOUTH EVERY DAY 90 tablet 1  . simvastatin (ZOCOR) 40 MG tablet TAKE 1 TABLET BY MOUTH EVERYDAY AT BEDTIME 90 tablet 1   Facility-Administered Medications Prior to Visit  Medication Dose Route Frequency Provider Last Rate Last Admin  . sodium chloride flush (NS) 0.9 % injection 10 mL  10 mL Intravenous PRN Cammie Sickle, MD   10 mL at 01/05/16 1135     Per HPI unless specifically indicated in ROS section below Review of Systems Objective:  Pulse 63   Temp (!) 97.3 F (36.3 C) (Temporal)   Ht 5' 2.5" (1.588 m)   Wt 176 lb 12 oz (80.2 kg)   SpO2 98%   BMI 31.81 kg/m   Wt Readings from Last 3 Encounters:  12/09/20 176 lb 12 oz (80.2 kg)  07/30/20 174 lb (78.9 kg)  06/06/20 177 lb (80.3 kg)       BP Readings from Last 3 Encounters:  12/09/20 140/80  07/30/20 (!) 151/72  06/06/20 132/84    Physical Exam Constitutional:      General: She is not in acute distress.Vital signs are normal.     Appearance: Normal appearance. She is well-developed and well-nourished. She is obese. She is not ill-appearing or toxic-appearing.  HENT:     Head: Normocephalic.     Right Ear: Hearing, tympanic membrane, ear canal and external ear normal.     Left Ear: Hearing, tympanic membrane, ear canal and external ear normal.     Nose: Nose normal.  Eyes:     General: Lids are normal. Lids are everted, no foreign bodies appreciated.     Extraocular Movements: EOM normal.     Conjunctiva/sclera: Conjunctivae normal.     Pupils: Pupils are equal, round, and reactive to light.  Neck:     Thyroid: No thyroid mass or thyromegaly.     Vascular: No carotid bruit.     Trachea: Trachea normal.  Cardiovascular:     Rate and Rhythm: Normal rate and regular rhythm.     Pulses: Intact distal pulses.     Heart sounds: Normal heart sounds, S1 normal and S2 normal. No murmur  heard. No gallop.   Pulmonary:     Effort: Pulmonary effort is normal. No respiratory distress.     Breath sounds: Normal breath sounds. No wheezing, rhonchi or rales.  Abdominal:     General: Bowel sounds are normal. There is no distension or abdominal bruit.     Palpations: Abdomen is soft. There is no fluid wave, hepatosplenomegaly or mass.     Tenderness: There is no abdominal tenderness. There is no CVA tenderness, guarding or rebound.  Hernia: No hernia is present.  Musculoskeletal:     Cervical back: Normal range of motion and neck supple.  Lymphadenopathy:     Cervical: No cervical adenopathy.     Upper Body:  No axillary adenopathy present. Skin:    General: Skin is warm, dry and intact.     Findings: No rash.  Neurological:     Mental Status: She is alert.     Cranial Nerves: No cranial nerve deficit.     Sensory: No sensory deficit.     Deep Tendon Reflexes: Strength normal.  Psychiatric:        Mood and Affect: Mood is not anxious or depressed.        Speech: Speech normal.        Behavior: Behavior normal. Behavior is cooperative.        Cognition and Memory: Cognition and memory normal.        Judgment: Judgment normal.     Diabetic foot exam: Normal inspection except bilateral pre-ulcerative callus No skin breakdown Normal DP pulses Normal sensation to light touch and monofilament Nails normal     Results for orders placed or performed in visit on 12/02/20  VITAMIN D 25 Hydroxy (Vit-D Deficiency, Fractures)  Result Value Ref Range   VITD 44.19 30.00 - 100.00 ng/mL  Comprehensive metabolic panel  Result Value Ref Range   Sodium 139 135 - 145 mEq/L   Potassium 4.2 3.5 - 5.1 mEq/L   Chloride 105 96 - 112 mEq/L   CO2 29 19 - 32 mEq/L   Glucose, Bld 121 (H) 70 - 99 mg/dL   BUN 17 6 - 23 mg/dL   Creatinine, Ser 0.82 0.40 - 1.20 mg/dL   Total Bilirubin 0.5 0.2 - 1.2 mg/dL   Alkaline Phosphatase 104 39 - 117 U/L   AST 19 0 - 37 U/L   ALT 15 0 - 35 U/L    Total Protein 8.0 6.0 - 8.3 g/dL   Albumin 4.0 3.5 - 5.2 g/dL   GFR 69.38 >60.00 mL/min   Calcium 9.6 8.4 - 10.5 mg/dL  Lipid panel  Result Value Ref Range   Cholesterol 131 0 - 200 mg/dL   Triglycerides 133.0 0.0 - 149.0 mg/dL   HDL 45.00 >39.00 mg/dL   VLDL 26.6 0.0 - 40.0 mg/dL   LDL Cholesterol 60 0 - 99 mg/dL   Total CHOL/HDL Ratio 3    NonHDL 86.31   Hemoglobin A1c  Result Value Ref Range   Hgb A1c MFr Bld 5.7 4.6 - 6.5 %    This visit occurred during the SARS-CoV-2 public health emergency.  Safety protocols were in place, including screening questions prior to the visit, additional usage of staff PPE, and extensive cleaning of exam room while observing appropriate contact time as indicated for disinfecting solutions.   COVID 19 screen:  No recent travel or known exposure to COVID19 The patient denies respiratory symptoms of COVID 19 at this time. The importance of social distancing was discussed today.   Assessment and Plan   The patient's preventative maintenance and recommended screening tests for an annual wellness exam were reviewed in full today. Brought up to date unless services declined.  Counselled on the importance of diet, exercise, and its role in overall health and mortality. The patient's FH and SH was reviewed, including their home life, tobacco status, and drug and alcohol status.   Vaccines:  Uptodate except, considering shingles/Td.Consider COVID 3  Rx dose. Mammo: Hx of breast cancer.Stable10/2021  Bone density:02/2013 osteopenia, repeat in 5 years... DUE Colon: history of stage 4 colon cancer... Per GI Dr. Collene Mares, colonoscopy4/2019 Repeat in 3-5 years.Marland Kitchen PAP/DVE: DVE, pap not indicated, no symptoms. No vaginal bleeding Onc following thickening in uterus 07/2019... endometrial biopy.. negative. Hep C completed Nonsmoker. Reviewed advanced directives.  Problem List Items Addressed This Visit    Class 1 obesity due to excess calories with  serious comorbidity and body mass index (BMI) of 34.0 to 34.9 in adult (Chronic)    Encouraged exercise, weight loss, healthy eating habits.       History of TIA (transient ischemic attack)   Hyperlipidemia associated with type 2 diabetes mellitus (HCC) (Chronic)     LDL at goal < 70 given past TIA on simvastatin 40 mg daily.      Hypertension associated with diabetes (HCC) (Chronic)    Tolerable control on losartan 50 mg daily.       Osteopenia (Chronic)   Type 2 diabetes mellitus with circulatory disorder  TIA/HTN(HCC) (Chronic)    Associated with HTN and past TIA Well controlled with diet.      Vitamin D deficiency (Chronic)    resolved on supplement.        Other Visit Diagnoses    Medicare annual wellness visit, subsequent    -  Primary   Estrogen deficiency       Relevant Orders   DG Bone Density      [name], MD

## 2020-12-09 NOTE — Assessment & Plan Note (Signed)
resolved on supplement.

## 2020-12-09 NOTE — Assessment & Plan Note (Signed)
Encouraged exercise, weight loss, healthy eating habits. ? ?

## 2021-01-27 ENCOUNTER — Ambulatory Visit: Payer: Medicare Other | Attending: Internal Medicine

## 2021-01-27 DIAGNOSIS — Z23 Encounter for immunization: Secondary | ICD-10-CM

## 2021-01-27 NOTE — Progress Notes (Signed)
   Covid-19 Vaccination Clinic  Name:  Susan Duffy    MRN: [id number] DOB: 09-26-1946  01/27/2021  [name] was observed post Covid-19 immunization for 15 minutes without incident. She was provided with Vaccine Information Sheet and instruction to access the V-Safe system.   [name] was instructed to call 911 with any severe reactions post vaccine: Marland Kitchen Difficulty breathing  . Swelling of face and throat  . A fast heartbeat  . A bad rash all over body  . Dizziness and weakness   Immunizations Administered    Name Date Dose VIS Date Route   PFIZER Comrnaty(Gray TOP) Covid-19 Vaccine 01/27/2021  1:10 PM 0.3 mL 11/13/2020 Intramuscular   Manufacturer: [employer]   Lot: [id number]   NDC: [phone]

## 2021-01-28 ENCOUNTER — Inpatient Hospital Stay: Payer: Medicare Other | Attending: Internal Medicine

## 2021-01-28 ENCOUNTER — Inpatient Hospital Stay (HOSPITAL_BASED_OUTPATIENT_CLINIC_OR_DEPARTMENT_OTHER): Payer: Medicare Other | Admitting: Internal Medicine

## 2021-01-28 ENCOUNTER — Encounter: Payer: Self-pay | Admitting: Internal Medicine

## 2021-01-28 ENCOUNTER — Other Ambulatory Visit: Payer: Self-pay

## 2021-01-28 DIAGNOSIS — Z86000 Personal history of in-situ neoplasm of breast: Secondary | ICD-10-CM | POA: Insufficient documentation

## 2021-01-28 DIAGNOSIS — Z95828 Presence of other vascular implants and grafts: Secondary | ICD-10-CM

## 2021-01-28 DIAGNOSIS — Z803 Family history of malignant neoplasm of breast: Secondary | ICD-10-CM | POA: Insufficient documentation

## 2021-01-28 DIAGNOSIS — D259 Leiomyoma of uterus, unspecified: Secondary | ICD-10-CM | POA: Insufficient documentation

## 2021-01-28 DIAGNOSIS — Z832 Family history of diseases of the blood and blood-forming organs and certain disorders involving the immune mechanism: Secondary | ICD-10-CM | POA: Diagnosis not present

## 2021-01-28 DIAGNOSIS — Z85038 Personal history of other malignant neoplasm of large intestine: Secondary | ICD-10-CM

## 2021-01-28 DIAGNOSIS — Z801 Family history of malignant neoplasm of trachea, bronchus and lung: Secondary | ICD-10-CM | POA: Diagnosis not present

## 2021-01-28 DIAGNOSIS — Z902 Acquired absence of lung [part of]: Secondary | ICD-10-CM | POA: Diagnosis not present

## 2021-01-28 LAB — CBC WITH DIFFERENTIAL/PLATELET
Abs Immature Granulocytes: 0.02 10*3/uL (ref 0.00–0.07)
Basophils Absolute: 0.1 10*3/uL (ref 0.0–0.1)
Basophils Relative: 1 %
Eosinophils Absolute: 0.2 10*3/uL (ref 0.0–0.5)
Eosinophils Relative: 2 %
HCT: 43.2 % (ref 36.0–46.0)
Hemoglobin: 14.1 g/dL (ref 12.0–15.0)
Immature Granulocytes: 0 %
Lymphocytes Relative: 9 %
Lymphs Abs: 0.7 10*3/uL (ref 0.7–4.0)
MCH: 32.3 pg (ref 26.0–34.0)
MCHC: 32.6 g/dL (ref 30.0–36.0)
MCV: 98.9 fL (ref 80.0–100.0)
Monocytes Absolute: 0.7 10*3/uL (ref 0.1–1.0)
Monocytes Relative: 9 %
Neutro Abs: 6.1 10*3/uL (ref 1.7–7.7)
Neutrophils Relative %: 79 %
Platelets: 207 10*3/uL (ref 150–400)
RBC: 4.37 MIL/uL (ref 3.87–5.11)
RDW: 12.6 % (ref 11.5–15.5)
WBC: 7.8 10*3/uL (ref 4.0–10.5)
nRBC: 0 % (ref 0.0–0.2)

## 2021-01-28 LAB — COMPREHENSIVE METABOLIC PANEL
ALT: 16 U/L (ref 0–44)
AST: 22 U/L (ref 15–41)
Albumin: 3.7 g/dL (ref 3.5–5.0)
Alkaline Phosphatase: 92 U/L (ref 38–126)
Anion gap: 8 (ref 5–15)
BUN: 20 mg/dL (ref 8–23)
CO2: 25 mmol/L (ref 22–32)
Calcium: 8.6 mg/dL — ABNORMAL LOW (ref 8.9–10.3)
Chloride: 106 mmol/L (ref 98–111)
Creatinine, Ser: 0.77 mg/dL (ref 0.44–1.00)
GFR, Estimated: >60 mL/min (ref >60)
Glucose, Bld: 134 mg/dL — ABNORMAL HIGH (ref 70–99)
Potassium: 3.8 mmol/L (ref 3.5–5.1)
Sodium: 139 mmol/L (ref 135–145)
Total Bilirubin: 0.7 mg/dL (ref 0.3–1.2)
Total Protein: 7.7 g/dL (ref 6.5–8.1)

## 2021-01-28 MED ORDER — SODIUM CHLORIDE 0.9% FLUSH
10.0000 mL | Freq: Once | INTRAVENOUS | Status: AC
  Administered 2021-01-28: 10 mL via INTRAVENOUS
  Filled 2021-01-28: qty 10

## 2021-01-28 MED ORDER — HEPARIN SOD (PORK) LOCK FLUSH 100 UNIT/ML IV SOLN
500.0000 [IU] | Freq: Once | INTRAVENOUS | Status: AC
  Administered 2021-01-28: 500 [IU] via INTRAVENOUS
  Filled 2021-01-28: qty 5

## 2021-01-28 NOTE — Progress Notes (Signed)
[facility] OFFICE PROGRESS NOTE  Patient Care Team: Jinny Sanders, MD as PCP - General Clent Jacks, RN as Oncology Nurse Navigator  Cancer Staging No matching staging information was found for the patient.   Oncology History Overview Note  # 2005- COLON CA [Stage II- left sided; s/p Surgery in [place]]; no Adj chemo.    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    # Nov 2010-LLE DVT [on Tam] on coumadin; AUG 2017- STOP coumadin  #August 2020-endometrial biopsy-Dr. Fransisca Connors negative for malignancy.  # ; PN-G-1; port flush q 6 w   # Left kidney cyst [Feb 2017]  # colonoscopy- 2019 [Dr.Mann; [place]]  DIAGNOSIS: METASTATIC COLON CA  STAGE: IV; NED        ;GOALS: control  CURRENT/MOST RECENT THERAPY: surveillance    History of colon cancer      INTERVAL HISTORY:  Susan Duffy 75 y.o.  female pleasant patient above history of metastatic colon cancer currently on surveillance is here for follow-up.  Patient denies any blood in stools or black or stools.  Denies any vaginal bleeding.  No worsening pain.  No headaches.  No nausea or vomiting.     Review of Systems  Constitutional: Negative for chills, diaphoresis, fever, malaise/fatigue and weight loss.  HENT: Negative for nosebleeds and sore throat.   Eyes: Negative for double vision.  Respiratory: Negative for cough, hemoptysis, sputum production, shortness of breath and wheezing.   Cardiovascular: Negative for chest pain, palpitations, orthopnea and leg swelling.  Gastrointestinal: Negative for abdominal pain, blood in stool, constipation, diarrhea, heartburn, melena, nausea and vomiting.  Genitourinary: Negative for dysuria, frequency and  urgency.  Musculoskeletal: Positive for back pain and joint pain.  Skin: Negative.  Negative for itching and rash.  Neurological: Negative for dizziness, tingling, focal weakness, weakness and headaches.  Endo/Heme/Allergies: Does not bruise/bleed easily.  Psychiatric/Behavioral: Negative for depression. The patient is not nervous/anxious and does not have insomnia.       PAST MEDICAL HISTORY :  Past Medical History:  Diagnosis Date  . Allergy   . Breast CA ([place])   . Breast cancer ([place]) 2010   in situ and took radiation, no chemo needed  . Colon cancer ([place])    stage 4 colon cancer  . History of chemotherapy 02/2011-08/2011   FOLFOX/AVASTIN- 8 CYCLES  . History of DVT (deep vein thrombosis) 2010   while taking tamoxifen-left lower extremity  . History of peripheral neuropathy   . Hyperlipidemia   . Hypertension   . IDA (iron deficiency anemia)   . Osteoarthritis   . Personal history of chemotherapy    colon ca  . Personal history of radiation therapy    breast and colon  . TIA (transient ischemic attack) 03/08/2011  . Vitamin D deficiency     PAST SURGICAL HISTORY :   Past Surgical History:  Procedure Laterality Date  . BREAST EXCISIONAL BIOPSY Left 1-10   s/p lumpectomy and radiation left dcis  . BREAST EXCISIONAL BIOPSY Left    fibroadeoma reoved age 33  . BREAST LUMPECTOMY  2010   left breast  . broken ankle  Left 08/2012  . COLON SURGERY     colectomy, partial  . LOBECTOMY  06-2009   metastatic colon cancer    FAMILY HISTORY :   Family History  Problem Relation Age of Onset  . Hypertension Mother   . Stroke Mother   . Coronary artery disease Mother   . Thyroid disease Mother   . Diabetes Father   . Hypertension Father   . Lung cancer Father   . Heart attack Maternal Grandfather   . Breast cancer Maternal Grandmother   . Thyroid disease Brother   . Diabetes Paternal Grandmother   . Colon cancer Paternal Grandfather   . Deep vein thrombosis Other      SOCIAL HISTORY:   Social History   Tobacco Use  . Smoking status: Never Smoker  . Smokeless tobacco: Never Used  Vaping Use  . Vaping Use: Never used  Substance Use Topics  . Alcohol use: No  . Drug use: No    ALLERGIES:  is allergic to amoxicillin, erythromycin, and nitrofuran derivatives.  MEDICATIONS:  Current Outpatient Medications  Medication Sig Dispense Refill  . acetaminophen (TYLENOL) 500 MG tablet as needed for moderate pain. Take 1-2 by mouth every 6 hours as needed     . aspirin 81 MG chewable tablet Chew 81 mg by mouth daily.    . cholecalciferol (VITAMIN D) 400 UNITS TABS tablet Take 400 Units by mouth 2 (two) times daily.    . Cyanocobalamin (VITAMIN B-12) 2000 MCG TBCR Take 1 tablet by mouth daily.    Marland Kitchen lidocaine-prilocaine (EMLA) cream Apply 1 application topically as needed. To port a cath site approximately 1 to 2 hours prior to port needle insertion. 30 g 12  . losartan (COZAAR) 50 MG tablet TAKE 1 TABLET BY MOUTH EVERY DAY 90 tablet 1  . simvastatin (ZOCOR) 40 MG tablet TAKE 1 TABLET BY MOUTH EVERYDAY AT BEDTIME 90 tablet 1   No current facility-administered medications for this visit.   Facility-Administered Medications Ordered in Other Visits  Medication Dose Route Frequency Provider Last Rate Last Admin  . sodium chloride flush (NS) 0.9 % injection 10 mL  10 mL Intravenous PRN Cammie Sickle, MD   10 mL at 01/05/16 1135    PHYSICAL EXAMINATION: ECOG PERFORMANCE STATUS: 0 - Asymptomatic  BP 134/84   Pulse 76   Temp 98.3 F (36.8 C) (Tympanic)   Resp 20   There were no vitals filed for this visit. Physical Exam Constitutional:      Comments: Obese.  Walks by herself.  She is alone.  HENT:     Head: Normocephalic and atraumatic.     Mouth/Throat:     Pharynx: No oropharyngeal exudate.  Eyes:     Pupils: Pupils are equal, round, and reactive to light.  Cardiovascular:     Rate and Rhythm: Normal rate and regular rhythm.   Pulmonary:     Effort: No respiratory distress.     Breath sounds: No wheezing.  Abdominal:     General: Bowel sounds are normal. There is no distension.     Palpations: Abdomen is soft. There is no mass.     Tenderness: There is no abdominal tenderness. There is no guarding or rebound.  Musculoskeletal:        General: No tenderness. Normal range of motion.     Cervical back: Normal range of motion and neck supple.  Skin:    General: Skin is warm.  Neurological:     Mental Status: She is alert and oriented to person, place, and time.  Psychiatric:  Mood and Affect: Affect normal.     SKIN:  no rashes or significant lesions    LABORATORY DATA:  I have reviewed the data as listed    Component Value Date/Time   NA 139 01/28/2021 1008   NA 141 12/20/2013 0901   K 3.8 01/28/2021 1008   K 4.1 12/20/2013 0901   CL 106 01/28/2021 1008   CL 106 12/18/2012 0842   CO2 25 01/28/2021 1008   CO2 27 12/20/2013 0901   GLUCOSE 134 (H) 01/28/2021 1008   GLUCOSE 103 12/20/2013 0901   GLUCOSE 109 (H) 12/18/2012 0842   BUN 20 01/28/2021 1008   BUN 23.2 12/20/2013 0901   CREATININE 0.77 01/28/2021 1008   CREATININE 1.04 12/31/2014 1023   CREATININE 1.0 12/20/2013 0901   CALCIUM 8.6 (L) 01/28/2021 1008   CALCIUM 10.1 12/20/2013 0901   PROT 7.7 01/28/2021 1008   PROT 7.9 12/31/2014 1023   PROT 8.5 (H) 12/20/2013 0901   ALBUMIN 3.7 01/28/2021 1008   ALBUMIN 3.3 (L) 12/31/2014 1023   ALBUMIN 3.7 12/20/2013 0901   AST 22 01/28/2021 1008   AST 24 12/31/2014 1023   AST 21 12/20/2013 0901   ALT 16 01/28/2021 1008   ALT 28 12/31/2014 1023   ALT 18 12/20/2013 0901   ALKPHOS 92 01/28/2021 1008   ALKPHOS 135 (H) 12/31/2014 1023   ALKPHOS 144 12/20/2013 0901   BILITOT 0.7 01/28/2021 1008   BILITOT 0.4 12/31/2014 1023   BILITOT 0.60 12/20/2013 0901   GFRNONAA >60 01/28/2021 1008   GFRNONAA 56 (L) 12/31/2014 1023   GFRNONAA 57 (L) 06/24/2014 1300   GFRAA >60 07/30/2020 1009    GFRAA >60 12/31/2014 1023   GFRAA >60 06/24/2014 1300    No results found for: SPEP, UPEP  Lab Results  Component Value Date   WBC 7.8 01/28/2021   NEUTROABS 6.1 01/28/2021   HGB 14.1 01/28/2021   HCT 43.2 01/28/2021   MCV 98.9 01/28/2021   PLT 207 01/28/2021      Chemistry      Component Value Date/Time   NA 139 01/28/2021 1008   NA 141 12/20/2013 0901   K 3.8 01/28/2021 1008   K 4.1 12/20/2013 0901   CL 106 01/28/2021 1008   CL 106 12/18/2012 0842   CO2 25 01/28/2021 1008   CO2 27 12/20/2013 0901   BUN 20 01/28/2021 1008   BUN 23.2 12/20/2013 0901   CREATININE 0.77 01/28/2021 1008   CREATININE 1.04 12/31/2014 1023   CREATININE 1.0 12/20/2013 0901      Component Value Date/Time   CALCIUM 8.6 (L) 01/28/2021 1008   CALCIUM 10.1 12/20/2013 0901   ALKPHOS 92 01/28/2021 1008   ALKPHOS 135 (H) 12/31/2014 1023   ALKPHOS 144 12/20/2013 0901   AST 22 01/28/2021 1008   AST 24 12/31/2014 1023   AST 21 12/20/2013 0901   ALT 16 01/28/2021 1008   ALT 28 12/31/2014 1023   ALT 18 12/20/2013 0901   BILITOT 0.7 01/28/2021 1008   BILITOT 0.4 12/31/2014 1023   BILITOT 0.60 12/20/2013 0901       RADIOGRAPHIC STUDIES: I have personally reviewed the radiological images as listed and agreed with the findings in the report. No results found.   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 AUG 2019- NED; fibroid uterus; endometrial thickening [see discussion below]. Colo- 2019- wnl. STABLE. CEA 6 months ago-normal. Awaiting CEA from today.  #  Clinically no evidence of recurrence. Continue surveillance without imaging.   # DCIS status post lumpectomy- S/P Aromasin. Mammogram in OCT 2021--[email or web address].   # IV Access- port palcement-port flushes very 3 months.   # DISPOSITION: # port flush in 3 months- # Follow-up with in  6 months-MD/port flush; CBC CMP CEA- Dr.B.    Orders Placed This Encounter   Procedures  . CBC with Differential    Standing Status:   Future    Standing Expiration Date:   01/28/2022  . Comprehensive metabolic panel    Standing Status:   Future    Standing Expiration Date:   01/28/2022  . CEA    Standing Status:   Future    Standing Expiration Date:   01/28/2022   All questions were answered. The patient knows to call the clinic with any problems, questions or concerns.      Cammie Sickle, MD 01/28/2021 11:33 AM

## 2021-01-28 NOTE — Assessment & Plan Note (Addendum)
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 AUG 2019- NED; fibroid uterus; endometrial thickening [see discussion below]. Colo- 2019- wnl. STABLE. CEA 6 months ago-normal. Awaiting CEA from today.  # Clinically no evidence of recurrence. Continue surveillance without imaging.   # DCIS status post lumpectomy- S/P Aromasin. Mammogram in OCT 2021--[email or web address].   # IV Access- port palcement-port flushes very 3 months.   # DISPOSITION: # port flush in 3 months- # Follow-up with in  6 months-MD/port flush; CBC CMP CEA- Dr.B.

## 2021-01-29 LAB — CEA: CEA: 1.3 ng/mL (ref 0.0–4.7)

## 2021-03-03 ENCOUNTER — Other Ambulatory Visit: Payer: Self-pay | Admitting: Family Medicine

## 2021-04-29 ENCOUNTER — Inpatient Hospital Stay: Payer: Medicare Other | Attending: Internal Medicine

## 2021-04-29 DIAGNOSIS — Z452 Encounter for adjustment and management of vascular access device: Secondary | ICD-10-CM | POA: Diagnosis not present

## 2021-04-29 DIAGNOSIS — Z85038 Personal history of other malignant neoplasm of large intestine: Secondary | ICD-10-CM | POA: Diagnosis not present

## 2021-04-29 DIAGNOSIS — Z95828 Presence of other vascular implants and grafts: Secondary | ICD-10-CM

## 2021-04-29 MED ORDER — HEPARIN SOD (PORK) LOCK FLUSH 100 UNIT/ML IV SOLN
INTRAVENOUS | Status: AC
  Filled 2021-04-29: qty 5

## 2021-04-29 MED ORDER — SODIUM CHLORIDE 0.9% FLUSH
10.0000 mL | INTRAVENOUS | Status: DC | PRN
  Administered 2021-04-29: 10 mL via INTRAVENOUS
  Filled 2021-04-29: qty 10

## 2021-04-29 MED ORDER — HEPARIN SOD (PORK) LOCK FLUSH 100 UNIT/ML IV SOLN
500.0000 [IU] | Freq: Once | INTRAVENOUS | Status: AC
  Administered 2021-04-29: 500 [IU] via INTRAVENOUS
  Filled 2021-04-29: qty 5

## 2021-05-08 ENCOUNTER — Other Ambulatory Visit: Payer: Self-pay | Admitting: Family Medicine

## 2021-05-08 NOTE — Telephone Encounter (Signed)
Pharmacy requests refill on: Simvastatin 40 mg   LAST REFILL: 11/10/2020 (Q-90, R-1) LAST OV: 12/09/2020 NEXT OV: 06/12/2021 PHARMACY: [id number] [place], [place]

## 2021-06-08 ENCOUNTER — Telehealth: Payer: Self-pay | Admitting: Family Medicine

## 2021-06-08 DIAGNOSIS — E1159 Type 2 diabetes mellitus with other circulatory complications: Secondary | ICD-10-CM

## 2021-06-08 DIAGNOSIS — E559 Vitamin D deficiency, unspecified: Secondary | ICD-10-CM

## 2021-06-08 NOTE — Telephone Encounter (Signed)
-----   Message from Cloyd Stagers, RT sent at 05/25/2021 12:58 PM EDT ----- Regarding: Lab Orders for Tuesday 7.5.2022 Please place lab orders for Tuesday 7.5.2022, office visit for 6 month f/u on Friday 7.8.2022 Thank you, Dyke Maes RT(R)

## 2021-06-09 ENCOUNTER — Other Ambulatory Visit: Payer: Self-pay

## 2021-06-09 ENCOUNTER — Other Ambulatory Visit (INDEPENDENT_AMBULATORY_CARE_PROVIDER_SITE_OTHER): Payer: Medicare Other

## 2021-06-09 DIAGNOSIS — E1159 Type 2 diabetes mellitus with other circulatory complications: Secondary | ICD-10-CM | POA: Diagnosis not present

## 2021-06-09 LAB — COMPREHENSIVE METABOLIC PANEL
ALT: 12 U/L (ref 0–35)
AST: 16 U/L (ref 0–37)
Albumin: 4.2 g/dL (ref 3.5–5.2)
Alkaline Phosphatase: 105 U/L (ref 39–117)
BUN: 15 mg/dL (ref 6–23)
CO2: 27 mEq/L (ref 19–32)
Calcium: 9.5 mg/dL (ref 8.4–10.5)
Chloride: 104 mEq/L (ref 96–112)
Creatinine, Ser: 0.92 mg/dL (ref 0.40–1.20)
GFR: 60.21 mL/min (ref >60.00)
Glucose, Bld: 131 mg/dL — ABNORMAL HIGH (ref 70–99)
Potassium: 4.2 mEq/L (ref 3.5–5.1)
Sodium: 140 mEq/L (ref 135–145)
Total Bilirubin: 0.7 mg/dL (ref 0.2–1.2)
Total Protein: 7.8 g/dL (ref 6.0–8.3)

## 2021-06-09 LAB — LIPID PANEL
Cholesterol: 143 mg/dL (ref 0–200)
HDL: 46.4 mg/dL (ref >39.00)
LDL Cholesterol: 68 mg/dL (ref 0–99)
NonHDL: 96.55
Total CHOL/HDL Ratio: 3
Triglycerides: 141 mg/dL (ref 0.0–149.0)
VLDL: 28.2 mg/dL (ref 0.0–40.0)

## 2021-06-09 LAB — HEMOGLOBIN A1C: Hgb A1c MFr Bld: 5.9 % (ref 4.6–6.5)

## 2021-06-09 NOTE — Progress Notes (Signed)
No critical labs need to be addressed urgently. We will discuss labs in detail at upcoming office visit.   

## 2021-06-12 ENCOUNTER — Encounter: Payer: Self-pay | Admitting: Family Medicine

## 2021-06-12 ENCOUNTER — Other Ambulatory Visit: Payer: Self-pay

## 2021-06-12 ENCOUNTER — Ambulatory Visit (INDEPENDENT_AMBULATORY_CARE_PROVIDER_SITE_OTHER): Payer: Medicare Other | Admitting: Family Medicine

## 2021-06-12 VITALS — BP 122/78 | HR 75 | Temp 97.8°F | Ht 62.5 in | Wt 175.0 lb

## 2021-06-12 DIAGNOSIS — E6609 Other obesity due to excess calories: Secondary | ICD-10-CM

## 2021-06-12 DIAGNOSIS — Z8673 Personal history of transient ischemic attack (TIA), and cerebral infarction without residual deficits: Secondary | ICD-10-CM

## 2021-06-12 DIAGNOSIS — I152 Hypertension secondary to endocrine disorders: Secondary | ICD-10-CM

## 2021-06-12 DIAGNOSIS — E785 Hyperlipidemia, unspecified: Secondary | ICD-10-CM

## 2021-06-12 DIAGNOSIS — E1159 Type 2 diabetes mellitus with other circulatory complications: Secondary | ICD-10-CM | POA: Diagnosis not present

## 2021-06-12 DIAGNOSIS — E1169 Type 2 diabetes mellitus with other specified complication: Secondary | ICD-10-CM | POA: Diagnosis not present

## 2021-06-12 DIAGNOSIS — Z6834 Body mass index (BMI) 34.0-34.9, adult: Secondary | ICD-10-CM

## 2021-06-12 NOTE — Progress Notes (Signed)
Patient ID: [name], female    DOB: [birth year], 75 y.o.   MRN: [id number]  This visit was conducted in person.  BP 122/78   Pulse 75   Temp 97.8 F (36.6 C) (Temporal)   Ht 5' 2.5" (1.588 m)   Wt 175 lb (79.4 kg)   SpO2 97%   BMI 31.50 kg/m    CC: Chief Complaint  Patient presents with   Diabetes   Follow-up    Subjective:   HPI: Susan Duffy is a 75 y.o. female presenting on 06/12/2021 for Diabetes and Follow-up  Diabetes:   Diet controlled Lab Results  Component Value Date   HGBA1C 5.9 06/09/2021  Using medications without difficulties: Hypoglycemic episodes: Hyperglycemic episodes: Feet problems: no ulcers Blood Sugars averaging: eye exam within last year:   Hx of TIA on ASA 81 mg daily  Wt Readings from Last 3 Encounters:  06/12/21 175 lb (79.4 kg)  12/09/20 176 lb 12 oz (80.2 kg)  07/30/20 174 lb (78.9 kg)     Elevated Cholesterol:  At goal  LDL < 70 on simvastatin 40 mg daily Lab Results  Component Value Date   CHOL 143 06/09/2021   HDL 46.40 06/09/2021   LDLCALC 68 06/09/2021   LDLDIRECT 74.0 11/14/2018   TRIG 141.0 06/09/2021   CHOLHDL 3 06/09/2021  Using medications without problems: none Muscle aches:  Diet compliance: good Exercise:  minimal given heat Other complaints:   BP Readings from Last 3 Encounters:  06/12/21 122/78  01/28/21 134/84  12/09/20 140/80    Hypertension:   Good control on  losartan 50 mg daily Using medication without problems or lightheadedness:  none Chest pain with exertion: none Edema:none Short of breath: none Average home BPs: 120/70 Other issues:      Relevant past medical, surgical, family and social history reviewed and updated as indicated. Interim medical history since our last visit reviewed. Allergies and medications reviewed and updated. Outpatient Medications Prior to Visit  Medication Sig Dispense Refill   acetaminophen (TYLENOL) 500 MG tablet as needed for moderate  pain. Take 1-2 by mouth every 6 hours as needed      aspirin 81 MG chewable tablet Chew 81 mg by mouth daily.     cholecalciferol (VITAMIN D) 400 UNITS TABS tablet Take 400 Units by mouth 2 (two) times daily.     Cyanocobalamin (VITAMIN B-12) 2000 MCG TBCR Take 1 tablet by mouth daily.     lidocaine-prilocaine (EMLA) cream Apply 1 application topically as needed. To port a cath site approximately 1 to 2 hours prior to port needle insertion. 30 g 12   losartan (COZAAR) 50 MG tablet TAKE 1 TABLET BY MOUTH EVERY DAY 90 tablet 1   simvastatin (ZOCOR) 40 MG tablet TAKE 1 TABLET BY MOUTH EVERYDAY AT BEDTIME 90 tablet 1   Facility-Administered Medications Prior to Visit  Medication Dose Route Frequency Provider Last Rate Last Admin   sodium chloride flush (NS) 0.9 % injection 10 mL  10 mL Intravenous PRN Cammie Sickle, MD   10 mL at 01/05/16 1135     Per HPI unless specifically indicated in ROS section below Review of Systems  Constitutional:  Negative for fatigue and fever.  HENT:  Negative for ear pain.   Eyes:  Negative for pain.  Respiratory:  Negative for chest tightness and shortness of breath.   Cardiovascular:  Negative for chest pain, palpitations and leg swelling.  Gastrointestinal:  Negative for abdominal pain.  Genitourinary:  Negative for dysuria.  Objective:  BP 122/78   Pulse 75   Temp 97.8 F (36.6 C) (Temporal)   Ht 5' 2.5" (1.588 m)   Wt 175 lb (79.4 kg)   SpO2 97%   BMI 31.50 kg/m   Wt Readings from Last 3 Encounters:  06/12/21 175 lb (79.4 kg)  12/09/20 176 lb 12 oz (80.2 kg)  07/30/20 174 lb (78.9 kg)      Physical Exam Constitutional:      General: She is not in acute distress.    Appearance: Normal appearance. She is well-developed. She is obese. She is not ill-appearing or toxic-appearing.  HENT:     Head: Normocephalic.     Right Ear: Hearing, tympanic membrane, ear canal and external ear normal. Tympanic membrane is not erythematous, retracted  or bulging.     Left Ear: Hearing, tympanic membrane, ear canal and external ear normal. Tympanic membrane is not erythematous, retracted or bulging.     Nose: No mucosal edema or rhinorrhea.     Right Sinus: No maxillary sinus tenderness or frontal sinus tenderness.     Left Sinus: No maxillary sinus tenderness or frontal sinus tenderness.     Mouth/Throat:     Pharynx: Uvula midline.  Eyes:     General: Lids are normal. Lids are everted, no foreign bodies appreciated.     Conjunctiva/sclera: Conjunctivae normal.     Pupils: Pupils are equal, round, and reactive to light.  Neck:     Thyroid: No thyroid mass or thyromegaly.     Vascular: No carotid bruit.     Trachea: Trachea normal.  Cardiovascular:     Rate and Rhythm: Normal rate and regular rhythm.     Pulses: Normal pulses.     Heart sounds: Normal heart sounds, S1 normal and S2 normal. No murmur heard.   No friction rub. No gallop.  Pulmonary:     Effort: Pulmonary effort is normal. No tachypnea or respiratory distress.     Breath sounds: Normal breath sounds. No decreased breath sounds, wheezing, rhonchi or rales.  Abdominal:     General: Bowel sounds are normal.     Palpations: Abdomen is soft.     Tenderness: There is no abdominal tenderness.  Musculoskeletal:     Cervical back: Normal range of motion and neck supple.  Skin:    General: Skin is warm and dry.     Findings: No rash.  Neurological:     Mental Status: She is alert.  Psychiatric:        Mood and Affect: Mood is not anxious or depressed.        Speech: Speech normal.        Behavior: Behavior normal. Behavior is cooperative.        Thought Content: Thought content normal.        Judgment: Judgment normal.    Diabetic foot exam: Normal inspection No skin breakdown No calluses  Normal DP pulses Normal sensation to light touch and monofilament Nails normal  Wearing compression hose on left leg.    Results for orders placed or performed in visit on  06/09/21  Lipid panel  Result Value Ref Range   Cholesterol 143 0 - 200 mg/dL   Triglycerides 141.0 0.0 - 149.0 mg/dL   HDL 46.40 >39.00 mg/dL   VLDL 28.2 0.0 - 40.0 mg/dL   LDL Cholesterol 68 0 - 99 mg/dL   Total CHOL/HDL Ratio 3    NonHDL 96.55  Hemoglobin A1c  Result Value Ref Range   Hgb A1c MFr Bld 5.9 4.6 - 6.5 %  Comprehensive metabolic panel  Result Value Ref Range   Sodium 140 135 - 145 mEq/L   Potassium 4.2 3.5 - 5.1 mEq/L   Chloride 104 96 - 112 mEq/L   CO2 27 19 - 32 mEq/L   Glucose, Bld 131 (H) 70 - 99 mg/dL   BUN 15 6 - 23 mg/dL   Creatinine, Ser 0.92 0.40 - 1.20 mg/dL   Total Bilirubin 0.7 0.2 - 1.2 mg/dL   Alkaline Phosphatase 105 39 - 117 U/L   AST 16 0 - 37 U/L   ALT 12 0 - 35 U/L   Total Protein 7.8 6.0 - 8.3 g/dL   Albumin 4.2 3.5 - 5.2 g/dL   GFR 60.21 >60.00 mL/min   Calcium 9.5 8.4 - 10.5 mg/dL    This visit occurred during the SARS-CoV-2 public health emergency.  Safety protocols were in place, including screening questions prior to the visit, additional usage of staff PPE, and extensive cleaning of exam room while observing appropriate contact time as indicated for disinfecting solutions.   COVID 19 screen:  No recent travel or known exposure to COVID19 The patient denies respiratory symptoms of COVID 19 at this time. The importance of social distancing was discussed today.   Assessment and Plan    Problem List Items Addressed This Visit     Hyperlipidemia associated with type 2 diabetes mellitus ([place]) (Chronic)    Stable, chronic.  Continue current medication.   Simvastatin 40 mg daily       Hypertension associated with diabetes ([facility]) (Chronic)    Stable, chronic.  Continue current medication.  Losartan 50 mg daily       Type 2 diabetes mellitus with circulatory disorder  TIA/HTN([facility]) - Primary (Chronic)    Diet controlled.  reviewed insulin resistance and need for weight loss.       History of TIA (transient ischemic attack)     LDL goal < 70       Severe obesity (BMI 35.0-39.9) with comorbidity ([place])    Encouraged exercise, weight loss, healthy eating habits.          [name], MD

## 2021-06-12 NOTE — Assessment & Plan Note (Signed)
LDL goal < 70

## 2021-06-12 NOTE — Assessment & Plan Note (Signed)
Diet controlled.  reviewed insulin resistance and need for weight loss.

## 2021-06-12 NOTE — Assessment & Plan Note (Signed)
Stable, chronic.  Continue current medication.  Losartan 50 mg daily

## 2021-06-12 NOTE — Patient Instructions (Signed)
Work on getting back to exercise. Keep working on weight loss overall.

## 2021-06-12 NOTE — Assessment & Plan Note (Signed)
Encouraged exercise, weight loss, healthy eating habits. ? ?

## 2021-06-12 NOTE — Assessment & Plan Note (Signed)
Stable, chronic.  Continue current medication.   Simvastatin 40 mg daily

## 2021-07-29 ENCOUNTER — Inpatient Hospital Stay: Payer: Medicare Other | Attending: Internal Medicine

## 2021-07-29 ENCOUNTER — Inpatient Hospital Stay (HOSPITAL_BASED_OUTPATIENT_CLINIC_OR_DEPARTMENT_OTHER): Payer: Medicare Other | Admitting: Internal Medicine

## 2021-07-29 ENCOUNTER — Other Ambulatory Visit: Payer: Self-pay

## 2021-07-29 DIAGNOSIS — Z9221 Personal history of antineoplastic chemotherapy: Secondary | ICD-10-CM | POA: Diagnosis not present

## 2021-07-29 DIAGNOSIS — Z803 Family history of malignant neoplasm of breast: Secondary | ICD-10-CM | POA: Diagnosis not present

## 2021-07-29 DIAGNOSIS — Z86000 Personal history of in-situ neoplasm of breast: Secondary | ICD-10-CM | POA: Diagnosis not present

## 2021-07-29 DIAGNOSIS — Z85038 Personal history of other malignant neoplasm of large intestine: Secondary | ICD-10-CM | POA: Diagnosis not present

## 2021-07-29 DIAGNOSIS — Z95828 Presence of other vascular implants and grafts: Secondary | ICD-10-CM

## 2021-07-29 DIAGNOSIS — Z923 Personal history of irradiation: Secondary | ICD-10-CM | POA: Diagnosis not present

## 2021-07-29 DIAGNOSIS — Z800 Family history of malignant neoplasm of digestive organs: Secondary | ICD-10-CM | POA: Insufficient documentation

## 2021-07-29 DIAGNOSIS — Z902 Acquired absence of lung [part of]: Secondary | ICD-10-CM | POA: Diagnosis not present

## 2021-07-29 DIAGNOSIS — Z801 Family history of malignant neoplasm of trachea, bronchus and lung: Secondary | ICD-10-CM | POA: Diagnosis not present

## 2021-07-29 LAB — COMPREHENSIVE METABOLIC PANEL
ALT: 15 U/L (ref 0–44)
AST: 21 U/L (ref 15–41)
Albumin: 3.9 g/dL (ref 3.5–5.0)
Alkaline Phosphatase: 103 U/L (ref 38–126)
Anion gap: 6 (ref 5–15)
BUN: 16 mg/dL (ref 8–23)
CO2: 27 mmol/L (ref 22–32)
Calcium: 8.9 mg/dL (ref 8.9–10.3)
Chloride: 103 mmol/L (ref 98–111)
Creatinine, Ser: 0.85 mg/dL (ref 0.44–1.00)
GFR, Estimated: >60 mL/min (ref >60)
Glucose, Bld: 138 mg/dL — ABNORMAL HIGH (ref 70–99)
Potassium: 3.8 mmol/L (ref 3.5–5.1)
Sodium: 136 mmol/L (ref 135–145)
Total Bilirubin: 0.5 mg/dL (ref 0.3–1.2)
Total Protein: 8.1 g/dL (ref 6.5–8.1)

## 2021-07-29 LAB — CBC WITH DIFFERENTIAL/PLATELET
Abs Immature Granulocytes: 0.02 10*3/uL (ref 0.00–0.07)
Basophils Absolute: 0.1 10*3/uL (ref 0.0–0.1)
Basophils Relative: 1 %
Eosinophils Absolute: 0.3 10*3/uL (ref 0.0–0.5)
Eosinophils Relative: 4 %
HCT: 43.6 % (ref 36.0–46.0)
Hemoglobin: 14.6 g/dL (ref 12.0–15.0)
Immature Granulocytes: 0 %
Lymphocytes Relative: 20 %
Lymphs Abs: 1.4 10*3/uL (ref 0.7–4.0)
MCH: 32.7 pg (ref 26.0–34.0)
MCHC: 33.5 g/dL (ref 30.0–36.0)
MCV: 97.5 fL (ref 80.0–100.0)
Monocytes Absolute: 0.7 10*3/uL (ref 0.1–1.0)
Monocytes Relative: 10 %
Neutro Abs: 4.5 10*3/uL (ref 1.7–7.7)
Neutrophils Relative %: 65 %
Platelets: 230 10*3/uL (ref 150–400)
RBC: 4.47 MIL/uL (ref 3.87–5.11)
RDW: 12.2 % (ref 11.5–15.5)
WBC: 7 10*3/uL (ref 4.0–10.5)
nRBC: 0 % (ref 0.0–0.2)

## 2021-07-29 MED ORDER — SODIUM CHLORIDE 0.9% FLUSH
10.0000 mL | Freq: Once | INTRAVENOUS | Status: AC
  Administered 2021-07-29: 10 mL via INTRAVENOUS
  Filled 2021-07-29: qty 10

## 2021-07-29 MED ORDER — HEPARIN SOD (PORK) LOCK FLUSH 100 UNIT/ML IV SOLN
INTRAVENOUS | Status: AC
  Administered 2021-07-29: 500 [IU] via INTRAVENOUS
  Filled 2021-07-29: qty 5

## 2021-07-29 MED ORDER — HEPARIN SOD (PORK) LOCK FLUSH 100 UNIT/ML IV SOLN
500.0000 [IU] | Freq: Once | INTRAVENOUS | Status: AC
  Filled 2021-07-29: qty 5

## 2021-07-29 NOTE — Progress Notes (Signed)
[place] OFFICE PROGRESS NOTE  Patient Care Team: Jinny Sanders, MD as PCP - General Clent Jacks, RN as Oncology Nurse Navigator  Cancer Staging No matching staging information was found for the patient.   Oncology History Overview Note  # 2005- COLON CA [Stage II- left sided; s/p Surgery in [facility] [place]]; no Adj chemo.    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    # Nov 2010-LLE DVT [on Tam] on coumadin; AUG 2017- STOP coumadin  #August 2020-endometrial biopsy-Dr. Fransisca Connors negative for malignancy.  # ; PN-G-1; port flush q 6 w   # Left kidney cyst [Feb 2017]  # colonoscopy- 2019 [Dr.Mann; [place]]  DIAGNOSIS: METASTATIC COLON CA  STAGE: IV; NED        ;GOALS: control  CURRENT/MOST RECENT THERAPY: surveillance    History of colon cancer      INTERVAL HISTORY:  Susan Duffy 75 y.o.  female pleasant patient above history of metastatic colon cancer currently on surveillance is here for follow-up.  No worsening aches and pains.  No vaginal bleeding.  No nausea vomiting headache.  No blood in stools or black or stools.  Review of Systems  Constitutional:  Negative for chills, diaphoresis, fever, malaise/fatigue and weight loss.  HENT:  Negative for nosebleeds and sore throat.   Eyes:  Negative for double vision.  Respiratory:  Negative for cough, hemoptysis, sputum production, shortness of breath and wheezing.   Cardiovascular:  Negative for chest pain, palpitations, orthopnea and leg swelling.  Gastrointestinal:  Negative for abdominal pain, blood in stool, constipation, diarrhea, heartburn, melena, nausea and vomiting.  Genitourinary:  Negative for dysuria, frequency and urgency.   Musculoskeletal:  Positive for back pain and joint pain.  Skin: Negative.  Negative for itching and rash.  Neurological:  Negative for dizziness, tingling, focal weakness, weakness and headaches.  Endo/Heme/Allergies:  Does not bruise/bleed easily.  Psychiatric/Behavioral:  Negative for depression. The patient is not nervous/anxious and does not have insomnia.      PAST MEDICAL HISTORY :  Past Medical History:  Diagnosis Date  . Allergy   . Breast CA ([place])   . Breast cancer ([place]) 2010   in situ and took radiation, no chemo needed  . Colon cancer ([place])    stage 4 colon cancer  . History of chemotherapy 02/2011-08/2011   FOLFOX/AVASTIN- 8 CYCLES  . History of DVT (deep vein thrombosis) 2010   while taking tamoxifen-left lower extremity  . History of peripheral neuropathy   . Hyperlipidemia   . Hypertension   . IDA (iron deficiency anemia)   . Osteoarthritis   . Personal history of chemotherapy    colon ca  . Personal history of radiation therapy    breast and colon  . TIA (transient ischemic attack) 03/08/2011  . Vitamin D deficiency     PAST SURGICAL HISTORY :   Past Surgical History:  Procedure Laterality Date  . BREAST EXCISIONAL BIOPSY Left 1-10   s/p lumpectomy and radiation left dcis  . BREAST EXCISIONAL BIOPSY Left    fibroadeoma reoved age 58  . BREAST LUMPECTOMY  2010   left breast  . broken ankle  Left 08/2012  . COLON SURGERY     colectomy, partial  .  LOBECTOMY  06-2009   metastatic colon cancer    FAMILY HISTORY :   Family History  Problem Relation Age of Onset  . Hypertension Mother   . Stroke Mother   . Coronary artery disease Mother   . Thyroid disease Mother   . Diabetes Father   . Hypertension Father   . Lung cancer Father   . Heart attack Maternal Grandfather   . Breast cancer Maternal Grandmother   . Thyroid disease Brother   . Diabetes Paternal Grandmother   . Colon cancer Paternal Grandfather   . Deep vein thrombosis Other     SOCIAL  HISTORY:   Social History   Tobacco Use  . Smoking status: Never  . Smokeless tobacco: Never  Vaping Use  . Vaping Use: Never used  Substance Use Topics  . Alcohol use: No  . Drug use: No    ALLERGIES:  is allergic to amoxicillin, erythromycin, and nitrofuran derivatives.  MEDICATIONS:  Current Outpatient Medications  Medication Sig Dispense Refill  . acetaminophen (TYLENOL) 500 MG tablet as needed for moderate pain. Take 1-2 by mouth every 6 hours as needed     . aspirin 81 MG chewable tablet Chew 81 mg by mouth daily.    . cholecalciferol (VITAMIN D) 400 UNITS TABS tablet Take 400 Units by mouth 2 (two) times daily.    . Cyanocobalamin (VITAMIN B-12) 2000 MCG TBCR Take 1 tablet by mouth daily.    Marland Kitchen lidocaine-prilocaine (EMLA) cream Apply 1 application topically as needed. To port a cath site approximately 1 to 2 hours prior to port needle insertion. 30 g 12  . losartan (COZAAR) 50 MG tablet TAKE 1 TABLET BY MOUTH EVERY DAY 90 tablet 1  . simvastatin (ZOCOR) 40 MG tablet TAKE 1 TABLET BY MOUTH EVERYDAY AT BEDTIME 90 tablet 1   No current facility-administered medications for this visit.   Facility-Administered Medications Ordered in Other Visits  Medication Dose Route Frequency Provider Last Rate Last Admin  . sodium chloride flush (NS) 0.9 % injection 10 mL  10 mL Intravenous PRN Cammie Sickle, MD   10 mL at 01/05/16 1135    PHYSICAL EXAMINATION: ECOG PERFORMANCE STATUS: 0 - Asymptomatic  BP (!) 157/100   Pulse 73   Temp 98.3 F (36.8 C) (Tympanic)   Resp 20   Ht 5' 2.5" (1.588 m)   Wt 175 lb (79.4 kg)   BMI 31.50 kg/m   Filed Weights   07/29/21 1052  Weight: 175 lb (79.4 kg)   Physical Exam Constitutional:      Comments: Obese.  Walks by herself.  She is alone.  HENT:     Head: Normocephalic and atraumatic.     Mouth/Throat:     Pharynx: No oropharyngeal exudate.  Eyes:     Pupils: Pupils are equal, round, and reactive to light.  Cardiovascular:      Rate and Rhythm: Normal rate and regular rhythm.  Pulmonary:     Effort: No respiratory distress.     Breath sounds: No wheezing.  Abdominal:     General: Bowel sounds are normal. There is no distension.     Palpations: Abdomen is soft. There is no mass.     Tenderness: There is no abdominal tenderness. There is no guarding or rebound.  Musculoskeletal:        General: No tenderness. Normal range of motion.     Cervical back: Normal range of motion and neck supple.  Skin:    General:  Skin is warm.  Neurological:     Mental Status: She is alert and oriented to person, place, and time.  Psychiatric:        Mood and Affect: Affect normal.    SKIN:  no rashes or significant lesions    LABORATORY DATA:  I have reviewed the data as listed    Component Value Date/Time   NA 136 07/29/2021 1034   NA 141 12/20/2013 0901   K 3.8 07/29/2021 1034   K 4.1 12/20/2013 0901   CL 103 07/29/2021 1034   CL 106 12/18/2012 0842   CO2 27 07/29/2021 1034   CO2 27 12/20/2013 0901   GLUCOSE 138 (H) 07/29/2021 1034   GLUCOSE 103 12/20/2013 0901   GLUCOSE 109 (H) 12/18/2012 0842   BUN 16 07/29/2021 1034   BUN 23.2 12/20/2013 0901   CREATININE 0.85 07/29/2021 1034   CREATININE 1.04 12/31/2014 1023   CREATININE 1.0 12/20/2013 0901   CALCIUM 8.9 07/29/2021 1034   CALCIUM 10.1 12/20/2013 0901   PROT 8.1 07/29/2021 1034   PROT 7.9 12/31/2014 1023   PROT 8.5 (H) 12/20/2013 0901   ALBUMIN 3.9 07/29/2021 1034   ALBUMIN 3.3 (L) 12/31/2014 1023   ALBUMIN 3.7 12/20/2013 0901   AST 21 07/29/2021 1034   AST 24 12/31/2014 1023   AST 21 12/20/2013 0901   ALT 15 07/29/2021 1034   ALT 28 12/31/2014 1023   ALT 18 12/20/2013 0901   ALKPHOS 103 07/29/2021 1034   ALKPHOS 135 (H) 12/31/2014 1023   ALKPHOS 144 12/20/2013 0901   BILITOT 0.5 07/29/2021 1034   BILITOT 0.4 12/31/2014 1023   BILITOT 0.60 12/20/2013 0901   GFRNONAA >60 07/29/2021 1034   GFRNONAA 56 (L) 12/31/2014 1023   GFRNONAA 57 (L)  06/24/2014 1300   GFRAA >60 07/30/2020 1009   GFRAA >60 12/31/2014 1023   GFRAA >60 06/24/2014 1300    No results found for: SPEP, UPEP  Lab Results  Component Value Date   WBC 7.0 07/29/2021   NEUTROABS 4.5 07/29/2021   HGB 14.6 07/29/2021   HCT 43.6 07/29/2021   MCV 97.5 07/29/2021   PLT 230 07/29/2021      Chemistry      Component Value Date/Time   NA 136 07/29/2021 1034   NA 141 12/20/2013 0901   K 3.8 07/29/2021 1034   K 4.1 12/20/2013 0901   CL 103 07/29/2021 1034   CL 106 12/18/2012 0842   CO2 27 07/29/2021 1034   CO2 27 12/20/2013 0901   BUN 16 07/29/2021 1034   BUN 23.2 12/20/2013 0901   CREATININE 0.85 07/29/2021 1034   CREATININE 1.04 12/31/2014 1023   CREATININE 1.0 12/20/2013 0901      Component Value Date/Time   CALCIUM 8.9 07/29/2021 1034   CALCIUM 10.1 12/20/2013 0901   ALKPHOS 103 07/29/2021 1034   ALKPHOS 135 (H) 12/31/2014 1023   ALKPHOS 144 12/20/2013 0901   AST 21 07/29/2021 1034   AST 24 12/31/2014 1023   AST 21 12/20/2013 0901   ALT 15 07/29/2021 1034   ALT 28 12/31/2014 1023   ALT 18 12/20/2013 0901   BILITOT 0.5 07/29/2021 1034   BILITOT 0.4 12/31/2014 1023   BILITOT 0.60 12/20/2013 0901       RADIOGRAPHIC STUDIES: I have personally reviewed the radiological images as listed and agreed with the findings in the report. No results found.   ASSESSMENT & PLAN:  History of colon cancer # Metastatic colon cancer chest wall lung  recurrence/ status post lobectomy- 2010. AUG 2019- NED; fibroid uterus; endometrial thickening [see discussion below]. Colo- 2019- wnl. STABLE. CEA 6 months ago-normal. Awaiting CEA from today.  # Clinically no evidence of recurrence. Continue surveillance without imaging.   # DCIS status post lumpectomy- S/P Aromasin. Mammogram in OCT 2021--[email or web address].   # IV Access- port palcement-port flushes very 3 months.   # DISPOSITION: # port flush in 3 months- # Follow-up with in  6 months-MD/port flush; CBC CMP  CEA- Dr.B.    No orders of the defined types were placed in this encounter.  All questions were answered. The patient knows to call the clinic with any problems, questions or concerns.      Cammie Sickle, MD 07/29/2021 11:25 AM

## 2021-07-29 NOTE — Assessment & Plan Note (Addendum)
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 AUG 2019- NED; fibroid uterus; endometrial thickening [see discussion below]. Colo- 2019- wnl [again in 2024]; STABLE. CEA 6 months ago-normal. Awaiting CEA from today.  # Clinically no evidence of recurrence. Continue surveillance without imaging.   # DCIS status post lumpectomy- S/P Aromasin. Mammogram in OCT 2021--[email or web address].   # IV Access- port palcement-port flushes very 3 months.   # DISPOSITION: # port flush in 3 months- # Follow-up with in  6 months-MD/port flush; CBC CMP CEA- Dr.B.

## 2021-07-30 LAB — CEA: CEA: 1.8 ng/mL (ref 0.0–4.7)

## 2021-08-13 ENCOUNTER — Other Ambulatory Visit: Payer: Self-pay | Admitting: Family Medicine

## 2021-08-13 DIAGNOSIS — Z1231 Encounter for screening mammogram for malignant neoplasm of breast: Secondary | ICD-10-CM

## 2021-08-27 ENCOUNTER — Other Ambulatory Visit: Payer: Self-pay | Admitting: Family Medicine

## 2021-08-27 DIAGNOSIS — Z23 Encounter for immunization: Secondary | ICD-10-CM | POA: Diagnosis not present

## 2021-09-09 ENCOUNTER — Other Ambulatory Visit: Payer: Self-pay

## 2021-09-09 ENCOUNTER — Ambulatory Visit
Admission: RE | Admit: 2021-09-09 | Discharge: 2021-09-09 | Disposition: A | Discharge status: Home / Self Care | Payer: Medicare Other | Source: Ambulatory Visit | Attending: Family Medicine | Admitting: Family Medicine

## 2021-09-09 DIAGNOSIS — Z1231 Encounter for screening mammogram for malignant neoplasm of breast: Secondary | ICD-10-CM | POA: Diagnosis not present

## 2021-10-21 ENCOUNTER — Other Ambulatory Visit: Payer: Self-pay

## 2021-10-21 ENCOUNTER — Inpatient Hospital Stay: Payer: Medicare Other | Attending: Internal Medicine

## 2021-10-21 DIAGNOSIS — Z452 Encounter for adjustment and management of vascular access device: Secondary | ICD-10-CM | POA: Insufficient documentation

## 2021-10-21 DIAGNOSIS — Z85038 Personal history of other malignant neoplasm of large intestine: Secondary | ICD-10-CM | POA: Insufficient documentation

## 2021-10-21 DIAGNOSIS — Z95828 Presence of other vascular implants and grafts: Secondary | ICD-10-CM

## 2021-10-21 MED ORDER — HEPARIN SOD (PORK) LOCK FLUSH 100 UNIT/ML IV SOLN
500.0000 [IU] | Freq: Once | INTRAVENOUS | Status: AC
  Administered 2021-10-21: 500 [IU] via INTRAVENOUS
  Filled 2021-10-21: qty 5

## 2021-10-21 MED ORDER — SODIUM CHLORIDE 0.9% FLUSH
10.0000 mL | Freq: Once | INTRAVENOUS | Status: AC
  Administered 2021-10-21: 10 mL via INTRAVENOUS
  Filled 2021-10-21: qty 10

## 2021-10-28 ENCOUNTER — Other Ambulatory Visit: Payer: Self-pay | Admitting: Family Medicine

## 2021-11-17 DIAGNOSIS — H25813 Combined forms of age-related cataract, bilateral: Secondary | ICD-10-CM | POA: Diagnosis not present

## 2021-12-10 DIAGNOSIS — Z01818 Encounter for other preprocedural examination: Secondary | ICD-10-CM | POA: Diagnosis not present

## 2021-12-10 DIAGNOSIS — H25812 Combined forms of age-related cataract, left eye: Secondary | ICD-10-CM | POA: Diagnosis not present

## 2021-12-10 DIAGNOSIS — H25811 Combined forms of age-related cataract, right eye: Secondary | ICD-10-CM | POA: Diagnosis not present

## 2021-12-18 ENCOUNTER — Ambulatory Visit: Payer: Medicare Other | Admitting: Family Medicine

## 2021-12-21 DIAGNOSIS — H25812 Combined forms of age-related cataract, left eye: Secondary | ICD-10-CM | POA: Diagnosis not present

## 2021-12-21 DIAGNOSIS — H2512 Age-related nuclear cataract, left eye: Secondary | ICD-10-CM | POA: Diagnosis not present

## 2022-01-08 DIAGNOSIS — H2511 Age-related nuclear cataract, right eye: Secondary | ICD-10-CM | POA: Diagnosis not present

## 2022-01-08 DIAGNOSIS — H25811 Combined forms of age-related cataract, right eye: Secondary | ICD-10-CM | POA: Diagnosis not present

## 2022-01-20 ENCOUNTER — Other Ambulatory Visit: Payer: Self-pay | Admitting: *Deleted

## 2022-01-20 DIAGNOSIS — C187 Malignant neoplasm of sigmoid colon: Secondary | ICD-10-CM

## 2022-01-27 ENCOUNTER — Encounter: Payer: Self-pay | Admitting: Internal Medicine

## 2022-01-27 ENCOUNTER — Inpatient Hospital Stay (HOSPITAL_BASED_OUTPATIENT_CLINIC_OR_DEPARTMENT_OTHER): Payer: Medicare Other | Admitting: Internal Medicine

## 2022-01-27 ENCOUNTER — Other Ambulatory Visit: Payer: Self-pay

## 2022-01-27 ENCOUNTER — Inpatient Hospital Stay: Payer: Medicare Other | Attending: Internal Medicine

## 2022-01-27 DIAGNOSIS — Z801 Family history of malignant neoplasm of trachea, bronchus and lung: Secondary | ICD-10-CM | POA: Insufficient documentation

## 2022-01-27 DIAGNOSIS — Z800 Family history of malignant neoplasm of digestive organs: Secondary | ICD-10-CM | POA: Insufficient documentation

## 2022-01-27 DIAGNOSIS — C187 Malignant neoplasm of sigmoid colon: Secondary | ICD-10-CM

## 2022-01-27 DIAGNOSIS — Z86000 Personal history of in-situ neoplasm of breast: Secondary | ICD-10-CM | POA: Diagnosis not present

## 2022-01-27 DIAGNOSIS — Z905 Acquired absence of kidney: Secondary | ICD-10-CM | POA: Insufficient documentation

## 2022-01-27 DIAGNOSIS — Z86718 Personal history of other venous thrombosis and embolism: Secondary | ICD-10-CM | POA: Insufficient documentation

## 2022-01-27 DIAGNOSIS — Z85038 Personal history of other malignant neoplasm of large intestine: Secondary | ICD-10-CM | POA: Diagnosis not present

## 2022-01-27 DIAGNOSIS — Z95828 Presence of other vascular implants and grafts: Secondary | ICD-10-CM

## 2022-01-27 LAB — COMPREHENSIVE METABOLIC PANEL
ALT: 14 U/L (ref 0–44)
AST: 23 U/L (ref 15–41)
Albumin: 3.8 g/dL (ref 3.5–5.0)
Alkaline Phosphatase: 110 U/L (ref 38–126)
Anion gap: 6 (ref 5–15)
BUN: 23 mg/dL (ref 8–23)
CO2: 29 mmol/L (ref 22–32)
Calcium: 9.3 mg/dL (ref 8.9–10.3)
Chloride: 103 mmol/L (ref 98–111)
Creatinine, Ser: 0.86 mg/dL (ref 0.44–1.00)
GFR, Estimated: >60 mL/min (ref >60)
Glucose, Bld: 112 mg/dL — ABNORMAL HIGH (ref 70–99)
Potassium: 3.6 mmol/L (ref 3.5–5.1)
Sodium: 138 mmol/L (ref 135–145)
Total Bilirubin: 0.6 mg/dL (ref 0.3–1.2)
Total Protein: 7.8 g/dL (ref 6.5–8.1)

## 2022-01-27 LAB — CBC WITH DIFFERENTIAL/PLATELET
Abs Immature Granulocytes: 0.02 10*3/uL (ref 0.00–0.07)
Basophils Absolute: 0 10*3/uL (ref 0.0–0.1)
Basophils Relative: 1 %
Eosinophils Absolute: 0.2 10*3/uL (ref 0.0–0.5)
Eosinophils Relative: 2 %
HCT: 45.2 % (ref 36.0–46.0)
Hemoglobin: 15 g/dL (ref 12.0–15.0)
Immature Granulocytes: 0 %
Lymphocytes Relative: 18 %
Lymphs Abs: 1.2 10*3/uL (ref 0.7–4.0)
MCH: 32.6 pg (ref 26.0–34.0)
MCHC: 33.2 g/dL (ref 30.0–36.0)
MCV: 98.3 fL (ref 80.0–100.0)
Monocytes Absolute: 0.5 10*3/uL (ref 0.1–1.0)
Monocytes Relative: 8 %
Neutro Abs: 4.7 10*3/uL (ref 1.7–7.7)
Neutrophils Relative %: 71 %
Platelets: 251 10*3/uL (ref 150–400)
RBC: 4.6 MIL/uL (ref 3.87–5.11)
RDW: 12.2 % (ref 11.5–15.5)
WBC: 6.7 10*3/uL (ref 4.0–10.5)
nRBC: 0 % (ref 0.0–0.2)

## 2022-01-27 MED ORDER — HEPARIN SOD (PORK) LOCK FLUSH 100 UNIT/ML IV SOLN
500.0000 [IU] | Freq: Once | INTRAVENOUS | Status: AC
  Administered 2022-01-27: 500 [IU] via INTRAVENOUS
  Filled 2022-01-27: qty 5

## 2022-01-27 MED ORDER — SODIUM CHLORIDE 0.9% FLUSH
10.0000 mL | Freq: Once | INTRAVENOUS | Status: AC
  Administered 2022-01-27: 10 mL via INTRAVENOUS
  Filled 2022-01-27: qty 10

## 2022-01-27 NOTE — Assessment & Plan Note (Signed)
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 AUG 2019- NED; fibroid uterus; endometrial thickening [see discussion below]. Colo- 2019- wnl [again in 2024]; STABLE. CEA 6 months ago-normal. Awaiting CEA from today.  # Clinically no evidence of recurrence. Continue surveillance without imaging.   # DCIS status post lumpectomy- S/P Aromasin. Mammogram in OCT 2021--[email or web address].   # IV Access- port palcement-port flushes very 3 months.   # DISPOSITION: # port flush in 3 months- # Follow-up with in  6 months-MD/port flush; CBC CMP CEA- Dr.B.

## 2022-01-27 NOTE — Progress Notes (Signed)
[facility] OFFICE PROGRESS NOTE  Patient Care Team: Jinny Sanders, MD as PCP - General Clent Jacks, RN as Oncology Nurse Navigator   Cancer Staging  No matching staging information was found for the patient.   Oncology History Overview Note  # 2005- COLON CA [Stage II- left sided; s/p Surgery in [facility] [facility] [place]]; no Adj chemo.    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    # Nov 2010-LLE DVT [on Tam] on coumadin; AUG 2017- STOP coumadin  #August 2020-endometrial biopsy-Dr. Fransisca Connors negative for malignancy.  # ; PN-G-1; port flush q 6 w   # Left kidney cyst [Feb 2017]  # colonoscopy- 2019 [Dr.Mann; [place]]  DIAGNOSIS: METASTATIC COLON CA  STAGE: IV; NED        ;GOALS: control  CURRENT/MOST RECENT THERAPY: surveillance    History of colon cancer      INTERVAL HISTORY:  Susan Duffy 76 y.o.  female pleasant patient above history of metastatic colon cancer currently on surveillance is here for follow-up.  No worsening aches and pains.  No vaginal bleeding.  No nausea vomiting headache.  No blood in stools or black or stools.  Review of Systems  Constitutional:  Negative for chills, diaphoresis, fever, malaise/fatigue and weight loss.  HENT:  Negative for nosebleeds and sore throat.   Eyes:  Negative for double vision.  Respiratory:  Negative for cough, hemoptysis, sputum production, shortness of breath and wheezing.   Cardiovascular:  Negative for chest pain, palpitations, orthopnea and leg swelling.  Gastrointestinal:  Negative for abdominal pain, blood in stool, constipation, diarrhea, heartburn, melena, nausea and vomiting.  Genitourinary:  Negative for dysuria, frequency and urgency.   Musculoskeletal:  Positive for back pain and joint pain.  Skin: Negative.  Negative for itching and rash.  Neurological:  Negative for dizziness, tingling, focal weakness, weakness and headaches.  Endo/Heme/Allergies:  Does not bruise/bleed easily.  Psychiatric/Behavioral:  Negative for depression. The patient is not nervous/anxious and does not have insomnia.      PAST MEDICAL HISTORY :  Past Medical History:  Diagnosis Date   Allergy    Breast CA ([place])    Breast cancer ([place]) 2010   in situ and took radiation, no chemo needed   Colon cancer ([place])    stage 4 colon cancer   History of chemotherapy 02/2011-08/2011   FOLFOX/AVASTIN- 8 CYCLES   History of DVT (deep vein thrombosis) 2010   while taking tamoxifen-left lower extremity   History of peripheral neuropathy    Hyperlipidemia    Hypertension    IDA (iron deficiency anemia)    Osteoarthritis    Personal history of chemotherapy    colon ca   Personal history of radiation therapy    breast and colon   TIA (transient ischemic attack) 03/08/2011   Vitamin D deficiency     PAST SURGICAL HISTORY :   Past Surgical History:  Procedure Laterality Date   BREAST EXCISIONAL BIOPSY Left 1-10   s/p lumpectomy and radiation left dcis   BREAST EXCISIONAL BIOPSY Left    fibroadeoma reoved age 30   BREAST LUMPECTOMY  2010   left breast   broken ankle  Left 08/2012   COLON SURGERY  colectomy, partial   LOBECTOMY  06-2009   metastatic colon cancer    FAMILY HISTORY :   Family History  Problem Relation Age of Onset   Hypertension Mother    Stroke Mother    Coronary artery disease Mother    Thyroid disease Mother    Diabetes Father    Hypertension Father    Lung cancer Father    Heart attack Maternal Grandfather    Breast cancer Maternal Grandmother    Thyroid disease Brother    Diabetes Paternal Grandmother    Colon cancer Paternal Grandfather    Deep vein thrombosis Other     SOCIAL HISTORY:   Social History    Tobacco Use   Smoking status: Never   Smokeless tobacco: Never  Vaping Use   Vaping Use: Never used  Substance Use Topics   Alcohol use: No   Drug use: No    ALLERGIES:  is allergic to amoxicillin, erythromycin, and nitrofuran derivatives.  MEDICATIONS:  Current Outpatient Medications  Medication Sig Dispense Refill   acetaminophen (TYLENOL) 500 MG tablet as needed for moderate pain. Take 1-2 by mouth every 6 hours as needed      aspirin 81 MG chewable tablet Chew 81 mg by mouth daily.     cholecalciferol (VITAMIN D) 400 UNITS TABS tablet Take 400 Units by mouth 2 (two) times daily.     Cyanocobalamin (VITAMIN B-12) 2000 MCG TBCR Take 1 tablet by mouth daily.     lidocaine-prilocaine (EMLA) cream Apply 1 application topically as needed. To port a cath site approximately 1 to 2 hours prior to port needle insertion. 30 g 12   losartan (COZAAR) 50 MG tablet TAKE 1 TABLET BY MOUTH EVERY DAY 90 tablet 1   simvastatin (ZOCOR) 40 MG tablet TAKE 1 TABLET BY MOUTH EVERYDAY AT BEDTIME 90 tablet 1   No current facility-administered medications for this visit.   Facility-Administered Medications Ordered in Other Visits  Medication Dose Route Frequency Provider Last Rate Last Admin   sodium chloride flush (NS) 0.9 % injection 10 mL  10 mL Intravenous PRN Cammie Sickle, MD   10 mL at 01/05/16 1135    PHYSICAL EXAMINATION: ECOG PERFORMANCE STATUS: 0 - Asymptomatic  BP 140/87 (BP Location: Right Arm, Patient Position: Sitting, Cuff Size: Normal)    Pulse 70    Temp (!) 97.2 F (36.2 C) (Tympanic)    Ht 5' 2.5" (1.588 m)    Wt 173 lb 9.6 oz (78.7 kg)    SpO2 96%    BMI 31.25 kg/m   Filed Weights   01/27/22 1042  Weight: 173 lb 9.6 oz (78.7 kg)   Physical Exam Constitutional:      Comments: Obese.  Walks by herself.  She is alone.  HENT:     Head: Normocephalic and atraumatic.     Mouth/Throat:     Pharynx: No oropharyngeal exudate.  Eyes:     Pupils: Pupils are equal,  round, and reactive to light.  Cardiovascular:     Rate and Rhythm: Normal rate and regular rhythm.  Pulmonary:     Effort: No respiratory distress.     Breath sounds: No wheezing.  Abdominal:     General: Bowel sounds are normal. There is no distension.     Palpations: Abdomen is soft. There is no mass.     Tenderness: There is no abdominal tenderness. There is no guarding or rebound.  Musculoskeletal:        General: No  tenderness. Normal range of motion.     Cervical back: Normal range of motion and neck supple.  Skin:    General: Skin is warm.  Neurological:     Mental Status: She is alert and oriented to person, place, and time.  Psychiatric:        Mood and Affect: Affect normal.    SKIN:  no rashes or significant lesions    LABORATORY DATA:  I have reviewed the data as listed    Component Value Date/Time   NA 138 01/27/2022 1014   NA 141 12/20/2013 0901   K 3.6 01/27/2022 1014   K 4.1 12/20/2013 0901   CL 103 01/27/2022 1014   CL 106 12/18/2012 0842   CO2 29 01/27/2022 1014   CO2 27 12/20/2013 0901   GLUCOSE 112 (H) 01/27/2022 1014   GLUCOSE 103 12/20/2013 0901   GLUCOSE 109 (H) 12/18/2012 0842   BUN 23 01/27/2022 1014   BUN 23.2 12/20/2013 0901   CREATININE 0.86 01/27/2022 1014   CREATININE 1.04 12/31/2014 1023   CREATININE 1.0 12/20/2013 0901   CALCIUM 9.3 01/27/2022 1014   CALCIUM 10.1 12/20/2013 0901   PROT 7.8 01/27/2022 1014   PROT 7.9 12/31/2014 1023   PROT 8.5 (H) 12/20/2013 0901   ALBUMIN 3.8 01/27/2022 1014   ALBUMIN 3.3 (L) 12/31/2014 1023   ALBUMIN 3.7 12/20/2013 0901   AST 23 01/27/2022 1014   AST 24 12/31/2014 1023   AST 21 12/20/2013 0901   ALT 14 01/27/2022 1014   ALT 28 12/31/2014 1023   ALT 18 12/20/2013 0901   ALKPHOS 110 01/27/2022 1014   ALKPHOS 135 (H) 12/31/2014 1023   ALKPHOS 144 12/20/2013 0901   BILITOT 0.6 01/27/2022 1014   BILITOT 0.4 12/31/2014 1023   BILITOT 0.60 12/20/2013 0901   GFRNONAA >60 01/27/2022 1014    GFRNONAA 56 (L) 12/31/2014 1023   GFRNONAA 57 (L) 06/24/2014 1300   GFRAA >60 07/30/2020 1009   GFRAA >60 12/31/2014 1023   GFRAA >60 06/24/2014 1300    No results found for: SPEP, UPEP  Lab Results  Component Value Date   WBC 6.7 01/27/2022   NEUTROABS 4.7 01/27/2022   HGB 15.0 01/27/2022   HCT 45.2 01/27/2022   MCV 98.3 01/27/2022   PLT 251 01/27/2022      Chemistry      Component Value Date/Time   NA 138 01/27/2022 1014   NA 141 12/20/2013 0901   K 3.6 01/27/2022 1014   K 4.1 12/20/2013 0901   CL 103 01/27/2022 1014   CL 106 12/18/2012 0842   CO2 29 01/27/2022 1014   CO2 27 12/20/2013 0901   BUN 23 01/27/2022 1014   BUN 23.2 12/20/2013 0901   CREATININE 0.86 01/27/2022 1014   CREATININE 1.04 12/31/2014 1023   CREATININE 1.0 12/20/2013 0901      Component Value Date/Time   CALCIUM 9.3 01/27/2022 1014   CALCIUM 10.1 12/20/2013 0901   ALKPHOS 110 01/27/2022 1014   ALKPHOS 135 (H) 12/31/2014 1023   ALKPHOS 144 12/20/2013 0901   AST 23 01/27/2022 1014   AST 24 12/31/2014 1023   AST 21 12/20/2013 0901   ALT 14 01/27/2022 1014   ALT 28 12/31/2014 1023   ALT 18 12/20/2013 0901   BILITOT 0.6 01/27/2022 1014   BILITOT 0.4 12/31/2014 1023   BILITOT 0.60 12/20/2013 0901       RADIOGRAPHIC STUDIES: I have personally reviewed the radiological images as listed and agreed with the  findings in the report. No results found.   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 AUG 2019- NED; fibroid uterus; endometrial thickening [see discussion below]. Colo- 2019- wnl [again in 2024]; STABLE. CEA 6 months ago-normal. Awaiting CEA from today.  # Clinically no evidence of recurrence. Continue surveillance without imaging.   # DCIS status post lumpectomy- S/P Aromasin. Mammogram in OCT 2021--[email or web address].   # IV Access- port palcement-port flushes very 3 months.   # DISPOSITION: # port flush  in 3 months- # Follow-up with in  6 months-MD/port flush; CBC CMP CEA- Dr.B.    Orders Placed This Encounter  Procedures   CBC with Differential/Platelet    Standing Status:   Future    Standing Expiration Date:   01/27/2023   Comprehensive metabolic panel    Standing Status:   Future    Standing Expiration Date:   01/27/2023   CEA    Standing Status:   Future    Standing Expiration Date:   01/27/2023    All questions were answered. The patient knows to call the clinic with any problems, questions or concerns.      Cammie Sickle, MD 01/27/2022 11:26 AM

## 2022-01-28 LAB — CEA: CEA: 1.8 ng/mL (ref 0.0–4.7)

## 2022-02-26 ENCOUNTER — Other Ambulatory Visit: Payer: Self-pay | Admitting: Family Medicine

## 2022-03-30 ENCOUNTER — Ambulatory Visit (INDEPENDENT_AMBULATORY_CARE_PROVIDER_SITE_OTHER): Payer: Medicare Other | Admitting: *Deleted

## 2022-03-30 DIAGNOSIS — Z780 Asymptomatic menopausal state: Secondary | ICD-10-CM

## 2022-03-30 DIAGNOSIS — Z13820 Encounter for screening for osteoporosis: Secondary | ICD-10-CM

## 2022-03-30 DIAGNOSIS — Z0000 Encounter for general adult medical examination without abnormal findings: Secondary | ICD-10-CM

## 2022-03-30 NOTE — Patient Instructions (Signed)
Susan Duffy , ?Thank you for taking time to come for your Medicare Wellness Visit. I appreciate your ongoing commitment to your health goals. Please review the following plan we discussed and let me know if I can assist you in the future.  ? ?Screening recommendations/referrals: ?Colonoscopy: up to date ?Mammogram: up to date ?Bone Density: Education provided ?Recommended yearly ophthalmology/optometry visit for glaucoma screening and checkup ?Recommended yearly dental visit for hygiene and checkup ? ?Vaccinations: ?Influenza vaccine: up to date ?Pneumococcal vaccine: up to date ?Tdap vaccine: Education provided ?Shingles vaccine: Education provided   ? ?Advanced directives: Education provided ? ?Conditions/risks identified:  ? ?Next appointment: 04-13-2022 @ 9:40  [place] ? ? ?Preventive Care 78 Years and Older, Female ?Preventive care refers to lifestyle choices and visits with your health care provider that can promote health and wellness. ?What does preventive care include? ?A yearly physical exam. This is also called an annual well check. ?Dental exams once or twice a year. ?Routine eye exams. Ask your health care provider how often you should have your eyes checked. ?Personal lifestyle choices, including: ?Daily care of your teeth and gums. ?Regular physical activity. ?Eating a healthy diet. ?Avoiding tobacco and drug use. ?Limiting alcohol use. ?Practicing safe sex. ?Taking low-dose aspirin every day. ?Taking vitamin and mineral supplements as recommended by your health care provider. ?What happens during an annual well check? ?The services and screenings done by your health care provider during your annual well check will depend on your age, overall health, lifestyle risk factors, and family history of disease. ?Counseling  ?Your health care provider may ask you questions about your: ?Alcohol use. ?Tobacco use. ?Drug use. ?Emotional well-being. ?Home and relationship well-being. ?Sexual activity. ?Eating  habits. ?History of falls. ?Memory and ability to understand (cognition). ?Work and work Statistician. ?Reproductive health. ?Screening  ?You may have the following tests or measurements: ?Height, weight, and BMI. ?Blood pressure. ?Lipid and cholesterol levels. These may be checked every 5 years, or more frequently if you are over 74 years old. ?Skin check. ?Lung cancer screening. You may have this screening every year starting at age 6 if you have a 30-pack-year history of smoking and currently smoke or have quit within the past 15 years. ?Fecal occult blood test (FOBT) of the stool. You may have this test every year starting at age 75. ?Flexible sigmoidoscopy or colonoscopy. You may have a sigmoidoscopy every 5 years or a colonoscopy every 10 years starting at age 74. ?Hepatitis C blood test. ?Hepatitis B blood test. ?Sexually transmitted disease (STD) testing. ?Diabetes screening. This is done by checking your blood sugar (glucose) after you have not eaten for a while (fasting). You may have this done every 1-3 years. ?Bone density scan. This is done to screen for osteoporosis. You may have this done starting at age 55. ?Mammogram. This may be done every 1-2 years. Talk to your health care provider about how often you should have regular mammograms. ?Talk with your health care provider about your test results, treatment options, and if necessary, the need for more tests. ?Vaccines  ?Your health care provider may recommend certain vaccines, such as: ?Influenza vaccine. This is recommended every year. ?Tetanus, diphtheria, and acellular pertussis (Tdap, Td) vaccine. You may need a Td booster every 10 years. ?Zoster vaccine. You may need this after age 30. ?Pneumococcal 13-valent conjugate (PCV13) vaccine. One dose is recommended after age 56. ?Pneumococcal polysaccharide (PPSV23) vaccine. One dose is recommended after age 10. ?Talk to your health care provider about  which screenings and vaccines you need and how  often you need them. ?This information is not intended to replace advice given to you by your health care provider. Make sure you discuss any questions you have with your health care provider. ?Document Released: 12/19/2015 Document Revised: 08/11/2016 Document Reviewed: 09/23/2015 ?Elsevier Interactive Patient Education ? 2017 [employer]. ? ?Fall Prevention in the Home ?Falls can cause injuries. They can happen to people of all ages. There are many things you can do to make your home safe and to help prevent falls. ?What can I do on the outside of my home? ?Regularly fix the edges of walkways and driveways and fix any cracks. ?Remove anything that might make you trip as you walk through a door, such as a raised step or threshold. ?Trim any bushes or trees on the path to your home. ?Use bright outdoor lighting. ?Clear any walking paths of anything that might make someone trip, such as rocks or tools. ?Regularly check to see if handrails are loose or broken. Make sure that both sides of any steps have handrails. ?Any raised decks and porches should have guardrails on the edges. ?Have any leaves, snow, or ice cleared regularly. ?Use sand or salt on walking paths during winter. ?Clean up any spills in your garage right away. This includes oil or grease spills. ?What can I do in the bathroom? ?Use night lights. ?Install grab bars by the toilet and in the tub and shower. Do not use towel bars as grab bars. ?Use non-skid mats or decals in the tub or shower. ?If you need to sit down in the shower, use a plastic, non-slip stool. ?Keep the floor dry. Clean up any water that spills on the floor as soon as it happens. ?Remove soap buildup in the tub or shower regularly. ?Attach bath mats securely with double-sided non-slip rug tape. ?Do not have throw rugs and other things on the floor that can make you trip. ?What can I do in the bedroom? ?Use night lights. ?Make sure that you have a light by your bed that is easy to  reach. ?Do not use any sheets or blankets that are too big for your bed. They should not hang down onto the floor. ?Have a firm chair that has side arms. You can use this for support while you get dressed. ?Do not have throw rugs and other things on the floor that can make you trip. ?What can I do in the kitchen? ?Clean up any spills right away. ?Avoid walking on wet floors. ?Keep items that you use a lot in easy-to-reach places. ?If you need to reach something above you, use a strong step stool that has a grab bar. ?Keep electrical cords out of the way. ?Do not use floor polish or wax that makes floors slippery. If you must use wax, use non-skid floor wax. ?Do not have throw rugs and other things on the floor that can make you trip. ?What can I do with my stairs? ?Do not leave any items on the stairs. ?Make sure that there are handrails on both sides of the stairs and use them. Fix handrails that are broken or loose. Make sure that handrails are as long as the stairways. ?Check any carpeting to make sure that it is firmly attached to the stairs. Fix any carpet that is loose or worn. ?Avoid having throw rugs at the top or bottom of the stairs. If you do have throw rugs, attach them to the floor with  carpet tape. ?Make sure that you have a light switch at the top of the stairs and the bottom of the stairs. If you do not have them, ask someone to add them for you. ?What else can I do to help prevent falls? ?Wear shoes that: ?Do not have high heels. ?Have rubber bottoms. ?Are comfortable and fit you well. ?Are closed at the toe. Do not wear sandals. ?If you use a stepladder: ?Make sure that it is fully opened. Do not climb a closed stepladder. ?Make sure that both sides of the stepladder are locked into place. ?Ask someone to hold it for you, if possible. ?Clearly mark and make sure that you can see: ?Any grab bars or handrails. ?First and last steps. ?Where the edge of each step is. ?Use tools that help you move  around (mobility aids) if they are needed. These include: ?Canes. ?Walkers. ?Scooters. ?Crutches. ?Turn on the lights when you go into a dark area. Replace any light bulbs as soon as they burn out. ?Set up your furni

## 2022-03-30 NOTE — Progress Notes (Signed)
? ?Subjective:  ? Susan Duffy is a 76 y.o. female who presents for Medicare Annual (Subsequent) preventive examination. ? ?I connected with  [name] on 03/30/22 by a telephone enabled telemedicine application and verified that I am speaking with the correct person using two identifiers. ?  ?I discussed the limitations of evaluation and management by telemedicine. The patient expressed understanding and agreed to proceed. ? ?Patient location: home ? ?Provider location: tele-health not in office ? ? ?Review of Systems    ? ?Cardiac Risk Factors include: advanced age (>61men, >50 women);hypertension ? ?   ?Objective:  ?  ?Today's Vitals  ? ?There is no height or weight on file to calculate BMI. ? ? ?  03/30/2022  ? 10:03 AM 01/27/2022  ? 10:36 AM 10/21/2021  ?  1:00 PM 07/29/2021  ? 10:53 AM 01/28/2021  ? 10:26 AM 11/19/2019  ? 10:01 AM 08/01/2019  ? 10:56 AM  ?Advanced Directives  ?Does Patient Have a Medical Advance Directive? Yes Yes No No No Yes Yes  ?Type of Paramedic of [place];Living will    [place];Living will Living will;Healthcare Power of Attorney  ?Does patient want to make changes to medical advance directive?       No - Patient declined  ?Copy of [place] in Chart? No - copy requested     No - copy requested No - copy requested  ?Would patient like information on creating a medical advance directive?   No - Patient declined No - Patient declined No - Patient declined    ? ? ?Current Medications (verified) ?Outpatient Encounter Medications as of 03/30/2022  ?Medication Sig  ? acetaminophen (TYLENOL) 500 MG tablet as needed for moderate pain. Take 1-2 by mouth every 6 hours as needed ?  ? aspirin 81 MG chewable tablet Chew 81 mg by mouth daily.  ? cholecalciferol (VITAMIN D) 400 UNITS TABS tablet Take 400 Units by mouth 2 (two) times daily.  ? Cyanocobalamin (VITAMIN B-12) 2000 MCG TBCR Take 1  tablet by mouth daily.  ? losartan (COZAAR) 50 MG tablet TAKE 1 TABLET BY MOUTH EVERY DAY  ? simvastatin (ZOCOR) 40 MG tablet TAKE 1 TABLET BY MOUTH EVERYDAY AT BEDTIME  ? lidocaine-prilocaine (EMLA) cream Apply 1 application topically as needed. To port a cath site approximately 1 to 2 hours prior to port needle insertion. (Patient not taking: Reported on 03/30/2022)  ? ?Facility-Administered Encounter Medications as of 03/30/2022  ?Medication  ? sodium chloride flush (NS) 0.9 % injection 10 mL  ? ? ?Allergies (verified) ?Amoxicillin, Erythromycin, and Nitrofuran derivatives  ? ?History: ?Past Medical History:  ?Diagnosis Date  ? Allergy   ? Breast CA ([place])   ? Breast cancer ([place]) 2010  ? in situ and took radiation, no chemo needed  ? Colon cancer ([place])   ? stage 4 colon cancer  ? History of chemotherapy 02/2011-08/2011  ? FOLFOX/AVASTIN- 8 CYCLES  ? History of DVT (deep vein thrombosis) 2010  ? while taking tamoxifen-left lower extremity  ? History of peripheral neuropathy   ? Hyperlipidemia   ? Hypertension   ? IDA (iron deficiency anemia)   ? Osteoarthritis   ? Personal history of chemotherapy   ? colon ca  ? Personal history of radiation therapy   ? breast and colon  ? TIA (transient ischemic attack) 03/08/2011  ? Vitamin D deficiency   ? ?Past Surgical History:  ?Procedure Laterality Date  ?  BREAST EXCISIONAL BIOPSY Left 1-10  ? s/p lumpectomy and radiation left dcis  ? BREAST EXCISIONAL BIOPSY Left   ? fibroadeoma reoved age 54  ? BREAST LUMPECTOMY  2010  ? left breast  ? broken ankle  Left 08/2012  ? COLON SURGERY    ? colectomy, partial  ? LOBECTOMY  06-2009  ? metastatic colon cancer  ? ?Family History  ?Problem Relation Age of Onset  ? Hypertension Mother   ? Stroke Mother   ? Coronary artery disease Mother   ? Thyroid disease Mother   ? Diabetes Father   ? Hypertension Father   ? Lung cancer Father   ? Heart attack Maternal Grandfather   ? Breast cancer Maternal Grandmother   ? Thyroid disease Brother   ?  Diabetes Paternal Grandmother   ? Colon cancer Paternal Grandfather   ? Deep vein thrombosis Other   ? ?Social History  ? ?Socioeconomic History  ? Marital status: Single  ?  Spouse name: Not on file  ? Number of children: Not on file  ? Years of education: Not on file  ? Highest education level: Not on file  ?Occupational History  ? Occupation: Pharmacist, hospital (retired)  ?  Employer: RETIRED  ?  Comment: 4th grade  ?Tobacco Use  ? Smoking status: Never  ? Smokeless tobacco: Never  ?Vaping Use  ? Vaping Use: Never used  ?Substance and Sexual Activity  ? Alcohol use: No  ? Drug use: No  ? Sexual activity: Not Currently  ?Other Topics Concern  ? Not on file  ?Social History Narrative  ? Patient walks .  ? Patient eats fruits and veggies, avoids salt and sugar, no fried food  ?  Has living will, full code, HCPOA [name], son (reviewed 2014)  ? ?Social Determinants of Health  ? ?Financial Resource Strain: Low Risk   ? Difficulty of Paying Living Expenses: Not hard at all  ?Food Insecurity: No Food Insecurity  ? Worried About Charity fundraiser in the Last Year: Never true  ? Ran Out of Food in the Last Year: Never true  ?Transportation Needs: No Transportation Needs  ? Lack of Transportation (Medical): No  ? Lack of Transportation (Non-Medical): No  ?Physical Activity: Insufficiently Active  ? Days of Exercise per Week: 4 days  ? Minutes of Exercise per Session: 30 min  ?Stress: No Stress Concern Present  ? Feeling of Stress : Not at all  ?Social Connections: Socially Isolated  ? Frequency of Communication with Friends and Family: More than three times a week  ? Frequency of Social Gatherings with Friends and Family: Once a week  ? Attends Religious Services: Never  ? Active Member of Clubs or Organizations: No  ? Attends Archivist Meetings: Never  ? Marital Status: Widowed  ? ? ?Tobacco Counseling ?Counseling given: Not Answered ? ? ?Clinical Intake: ? ?Pre-visit preparation completed: Yes ? ?Pain :  No/denies pain ? ?  ? ?Nutritional Risks: None ?Diabetes: No ? ?How often do you need to have someone help you when you read instructions, pamphlets, or other written materials from your doctor or pharmacy?: 1 - Never ? ?Diabetic?  no ? ?Interpreter Needed?: No ? ?Information entered by :: Leroy Kennedy LPN ? ? ?Activities of Daily Living ? ?  03/30/2022  ? 10:07 AM 06/12/2021  ? 10:04 AM  ?In your present state of health, do you have any difficulty performing the following activities:  ?Hearing? 0 0  ?Vision?  0 0  ?Difficulty concentrating or making decisions? 0 0  ?Walking or climbing stairs? 0 0  ?Dressing or bathing? 0 0  ?Doing errands, shopping? 0 0  ?Preparing Food and eating ? N   ?Using the Toilet? N   ?In the past six months, have you accidently leaked urine? N   ?Do you have problems with loss of bowel control? N   ?Managing your Medications? N   ?Managing your Finances? N   ?Housekeeping or managing your Housekeeping? N   ? ? ?Patient Care Team: ?Jinny Sanders, MD as PCP - General ?Clent Jacks, RN as Oncology Nurse Navigator ? ?Indicate any recent Medical Services you may have received from other than [facility] providers in the past year (date may be approximate). ? ?   ?Assessment:  ? This is a routine wellness examination for [place]. ? ?Hearing/Vision screen ?Hearing Screening - Comments:: No trouble hearing ?Vision Screening - Comments:: [facility] ?Up to date ?Cataracts removed ? ?Dietary issues and exercise activities discussed: ?Current Exercise Habits: Home exercise routine, Type of exercise: walking, Time (Minutes): 35, Frequency (Times/Week): 5, Weekly Exercise (Minutes/Week): 175, Intensity: Mild ? ? Goals Addressed   ? ?  ?  ?  ?  ? This Visit's Progress  ?  Patient Stated     ?  Continue Current lifestyle  ?  ? ?  ? ?Depression Screen ? ?  03/30/2022  ? 10:09 AM 12/09/2020  ? 11:04 AM 11/19/2019  ? 10:02 AM 11/14/2018  ? 10:46 AM 11/08/2017  ? 11:39 AM 11/01/2016  ? 11:53 AM 10/24/2015  ? 10:08  AM  ?PHQ 2/9 Scores  ?PHQ - 2 Score 0 0 0 0 0 0 0  ?PHQ- 9 Score   0 0     ?  ?Fall Risk ? ?  03/30/2022  ? 10:06 AM 12/09/2020  ? 11:05 AM 11/19/2019  ? 10:02 AM 10/31/2019  ? 11:44 AM 11/28/2018  ? 10:26 AM  ?Susan Duffy

## 2022-03-30 NOTE — Progress Notes (Signed)
I reviewed health advisor's note, was available for consultation, and agree with documentation and plan.  

## 2022-04-13 ENCOUNTER — Ambulatory Visit (INDEPENDENT_AMBULATORY_CARE_PROVIDER_SITE_OTHER): Payer: Medicare Other | Admitting: Family Medicine

## 2022-04-13 ENCOUNTER — Encounter: Payer: Self-pay | Admitting: Family Medicine

## 2022-04-13 VITALS — BP 136/80 | HR 79 | Ht 62.0 in | Wt 172.9 lb

## 2022-04-13 DIAGNOSIS — I152 Hypertension secondary to endocrine disorders: Secondary | ICD-10-CM | POA: Diagnosis not present

## 2022-04-13 DIAGNOSIS — E559 Vitamin D deficiency, unspecified: Secondary | ICD-10-CM

## 2022-04-13 DIAGNOSIS — E785 Hyperlipidemia, unspecified: Secondary | ICD-10-CM | POA: Diagnosis not present

## 2022-04-13 DIAGNOSIS — Z6831 Body mass index (BMI) 31.0-31.9, adult: Secondary | ICD-10-CM | POA: Diagnosis not present

## 2022-04-13 DIAGNOSIS — E1169 Type 2 diabetes mellitus with other specified complication: Secondary | ICD-10-CM | POA: Diagnosis not present

## 2022-04-13 DIAGNOSIS — E1159 Type 2 diabetes mellitus with other circulatory complications: Secondary | ICD-10-CM

## 2022-04-13 DIAGNOSIS — E6609 Other obesity due to excess calories: Secondary | ICD-10-CM

## 2022-04-13 LAB — HM DIABETES FOOT EXAM

## 2022-04-13 NOTE — Assessment & Plan Note (Signed)
Stable, chronic.  Continue current medication. ? ? ?Losartan 50 mg p.o. daily ?

## 2022-04-13 NOTE — Assessment & Plan Note (Signed)
Due for reevaluation ? ?Simvastatin 40 mg p.o. daily ?

## 2022-04-13 NOTE — Assessment & Plan Note (Signed)
Chronic, on supplement ? ?Due for reevaluation ?

## 2022-04-13 NOTE — Progress Notes (Signed)
? ? Patient ID: Susan Duffy, female    DOB: 30-Apr-1946, 76 y.o.   MRN: [id number] ? ?This visit was conducted in person. ? ?Blood pressure 136/80, pulse 79, height 5\' 2"  (1.575 m), weight 172 lb 14.4 oz (78.4 kg), SpO2 96 %.' ? ?CC: .cc ?Subjective:  ? ?HPI: ?Susan Duffy is a 76 y.o. female presenting on 04/13/2022 for  annual review of chronic health problems. ? ?The patient presents for  review of chronic health problems. He/She also has the following acute concerns today: fatigued more so as aging. ? ?The patient saw a LPN or RN for medicare wellness visit. ? Prevention and wellness was reviewed in detail. ?Note reviewed and important notes copied below. ? ?She is due for repeat evaluation of diabetes, vit D and high cholesterol.  Labs ordered today. ? ?LDL goal < 70 given past TIA on simvastatin 40 mg daily ?  ? ? ?Relevant past medical, surgical, family and social history reviewed and updated as indicated. Interim medical history since our last visit reviewed. ?Allergies and medications reviewed and updated. ?Outpatient Medications Prior to Visit  ?Medication Sig Dispense Refill  ? acetaminophen (TYLENOL) 500 MG tablet as needed for moderate pain. Take 1-2 by mouth every 6 hours as needed ?    ? aspirin 81 MG chewable tablet Chew 81 mg by mouth daily.    ? cholecalciferol (VITAMIN D) 400 UNITS TABS tablet Take 400 Units by mouth 2 (two) times daily.    ? Cyanocobalamin (VITAMIN B-12) 2000 MCG TBCR Take 1 tablet by mouth daily.    ? lidocaine-prilocaine (EMLA) cream Apply 1 application topically as needed. To port a cath site approximately 1 to 2 hours prior to port needle insertion. 30 g 12  ? losartan (COZAAR) 50 MG tablet TAKE 1 TABLET BY MOUTH EVERY DAY 90 tablet 1  ? simvastatin (ZOCOR) 40 MG tablet TAKE 1 TABLET BY MOUTH EVERYDAY AT BEDTIME 90 tablet 1  ? ?Facility-Administered Medications Prior to Visit  ?Medication Dose Route Frequency Provider Last Rate Last Admin  ? sodium chloride  flush (NS) 0.9 % injection 10 mL  10 mL Intravenous PRN Cammie Sickle, MD   10 mL at 01/05/16 1135  ?  ? ?Per HPI unless specifically indicated in ROS section below ?Review of Systems ?Objective:  ?There were no vitals taken for this visit.  ?Wt Readings from Last 3 Encounters:  ?01/27/22 173 lb 9.6 oz (78.7 kg)  ?07/29/21 175 lb (79.4 kg)  ?06/12/21 175 lb (79.4 kg)  ?  ?  ?Physical Exam ? ?Diabetic foot exam: ?Normal inspection ?No skin breakdown ? Bilateral pre-ulcerative calluses  ?Normal DP pulses ?Normal sensation to light touch and monofilament ?Nails normal ? ?   ?Results for orders placed or performed in visit on 01/27/22  ?CEA  ?Result Value Ref Range  ? CEA 1.8 0.0 - 4.7 ng/mL  ?Comprehensive metabolic panel  ?Result Value Ref Range  ? Sodium 138 135 - 145 mmol/L  ? Potassium 3.6 3.5 - 5.1 mmol/L  ? Chloride 103 98 - 111 mmol/L  ? CO2 29 22 - 32 mmol/L  ? Glucose, Bld 112 (H) 70 - 99 mg/dL  ? BUN 23 8 - 23 mg/dL  ? Creatinine, Ser 0.86 0.44 - 1.00 mg/dL  ? Calcium 9.3 8.9 - 10.3 mg/dL  ? Total Protein 7.8 6.5 - 8.1 g/dL  ? Albumin 3.8 3.5 - 5.0 g/dL  ? AST 23 15 - 41 U/L  ? ALT 14  0 - 44 U/L  ? Alkaline Phosphatase 110 38 - 126 U/L  ? Total Bilirubin 0.6 0.3 - 1.2 mg/dL  ? GFR, Estimated >60 >60 mL/min  ? Anion gap 6 5 - 15  ?CBC with Differential  ?Result Value Ref Range  ? WBC 6.7 4.0 - 10.5 K/uL  ? RBC 4.60 3.87 - 5.11 MIL/uL  ? Hemoglobin 15.0 12.0 - 15.0 g/dL  ? HCT 45.2 36.0 - 46.0 %  ? MCV 98.3 80.0 - 100.0 fL  ? MCH 32.6 26.0 - 34.0 pg  ? MCHC 33.2 30.0 - 36.0 g/dL  ? RDW 12.2 11.5 - 15.5 %  ? Platelets 251 150 - 400 K/uL  ? nRBC 0.0 0.0 - 0.2 %  ? Neutrophils Relative % 71 %  ? Neutro Abs 4.7 1.7 - 7.7 K/uL  ? Lymphocytes Relative 18 %  ? Lymphs Abs 1.2 0.7 - 4.0 K/uL  ? Monocytes Relative 8 %  ? Monocytes Absolute 0.5 0.1 - 1.0 K/uL  ? Eosinophils Relative 2 %  ? Eosinophils Absolute 0.2 0.0 - 0.5 K/uL  ? Basophils Relative 1 %  ? Basophils Absolute 0.0 0.0 - 0.1 K/uL  ? Immature  Granulocytes 0 %  ? Abs Immature Granulocytes 0.02 0.00 - 0.07 K/uL  ? ? ? ?COVID 19 screen:  No recent travel or known exposure to [place] ?The patient denies respiratory symptoms of COVID 19 at this time. ?The importance of social distancing was discussed today.  ? ?Assessment and Plan ?Body mass index is 31.62 kg/m?. ? ? The patient's preventative maintenance and recommended screening tests for an annual wellness exam were reviewed in full today. ?Brought up to date unless services declined. ? ?Counselled on the importance of diet, exercise, and its role in overall health and mortality. ?The patient's FH and SH was reviewed, including their home life, tobacco status, and drug and alcohol status.  ?Vaccines:   Uptodate except, considering shingles/Td . Consider COVID booster.Marland Kitchen ?Mammo: Hx of breast cancer. Stable 09/2021 ?Bone density:02/2013 osteopenia, repeat in 5 years... DUE ?Colon: history of stage 4 colon cancer... Per GI Dr. Collene Mares, colonoscopy 03/2018   Repeat in 3-5 years.... NOW ORDERED, PENDING ?PAP/DVE: DVE, pap not indicated, no symptoms. No vaginal bleeding ?Onc following thickening in uterus 07/2019... endometrial biopy.. negative. ?Hep C completed ? Nonsmoker. ? Reviewed advanced directives. ? ?Problem List Items Addressed This Visit   ? ? Hyperlipidemia associated with type 2 diabetes mellitus (HCC) (Chronic)  ?  Due for reevaluation ? ?Simvastatin 40 mg p.o. daily ? ?  ?  ? Relevant Orders  ? Lipid panel  ? Hypertension associated with diabetes ([place]) (Chronic)  ?  Stable, chronic.  Continue current medication. ? ? ?Losartan 50 mg p.o. daily ?  ?  ? Vitamin D deficiency (Chronic)  ?  Chronic, on supplement ? ?Due for reevaluation ? ?  ?  ? Relevant Orders  ? VITAMIN D 25 Hydroxy (Vit-D Deficiency, Fractures)  ? Class 1 obesity due to excess calories with serious comorbidity and body mass index (BMI) of 31.0 to 31.9 in adult  ?  Encouraged exercise, weight loss, healthy eating habits. ? ? ?  ?  ?  Controlled diabetes mellitus type 2 with complications ([place]) - Primary  ?  Due for reevaluation. ? ?  ?  ? ?Orders Placed This Encounter  ?Procedures  ? Lipid panel  ?  Standing Status:   Future  ?  Standing Expiration Date:   04/14/2023  ? Hemoglobin  A1c  ?  Standing Status:   Future  ?  Standing Expiration Date:   04/14/2023  ? VITAMIN D 25 Hydroxy (Vit-D Deficiency, Fractures)  ?  Standing Status:   Future  ?  Standing Expiration Date:   04/14/2023  ? HM DIABETES FOOT EXAM  ?  This external order was created through the Results Console.  ? ? ?[name], MD  ? ? ?

## 2022-04-13 NOTE — Patient Instructions (Signed)
Move forward with bone density and mammogram in fall as planned. ?

## 2022-04-13 NOTE — Assessment & Plan Note (Signed)
Encouraged exercise, weight loss, healthy eating habits. ? ?

## 2022-04-13 NOTE — Assessment & Plan Note (Signed)
Due for reevaluation 

## 2022-04-19 ENCOUNTER — Other Ambulatory Visit (INDEPENDENT_AMBULATORY_CARE_PROVIDER_SITE_OTHER): Payer: Medicare Other

## 2022-04-19 DIAGNOSIS — E1159 Type 2 diabetes mellitus with other circulatory complications: Secondary | ICD-10-CM | POA: Diagnosis not present

## 2022-04-19 DIAGNOSIS — E559 Vitamin D deficiency, unspecified: Secondary | ICD-10-CM

## 2022-04-19 DIAGNOSIS — E785 Hyperlipidemia, unspecified: Secondary | ICD-10-CM

## 2022-04-19 DIAGNOSIS — E1169 Type 2 diabetes mellitus with other specified complication: Secondary | ICD-10-CM

## 2022-04-19 LAB — HEMOGLOBIN A1C: Hgb A1c MFr Bld: 5.6 % (ref 4.6–6.5)

## 2022-04-19 LAB — LIPID PANEL
Cholesterol: 124 mg/dL (ref 0–200)
HDL: 42.1 mg/dL (ref >39.00)
LDL Cholesterol: 61 mg/dL (ref 0–99)
NonHDL: 82.02
Total CHOL/HDL Ratio: 3
Triglycerides: 106 mg/dL (ref 0.0–149.0)
VLDL: 21.2 mg/dL (ref 0.0–40.0)

## 2022-04-19 LAB — VITAMIN D 25 HYDROXY (VIT D DEFICIENCY, FRACTURES): VITD: 46.83 ng/mL (ref 30.00–100.00)

## 2022-04-21 ENCOUNTER — Other Ambulatory Visit: Payer: Self-pay | Admitting: Family Medicine

## 2022-04-26 ENCOUNTER — Inpatient Hospital Stay: Payer: Medicare Other | Attending: Internal Medicine

## 2022-04-26 DIAGNOSIS — Z85038 Personal history of other malignant neoplasm of large intestine: Secondary | ICD-10-CM | POA: Diagnosis not present

## 2022-04-26 DIAGNOSIS — Z95828 Presence of other vascular implants and grafts: Secondary | ICD-10-CM

## 2022-04-26 DIAGNOSIS — Z452 Encounter for adjustment and management of vascular access device: Secondary | ICD-10-CM | POA: Insufficient documentation

## 2022-04-26 MED ORDER — SODIUM CHLORIDE 0.9% FLUSH
10.0000 mL | Freq: Once | INTRAVENOUS | Status: AC
  Administered 2022-04-26: 10 mL via INTRAVENOUS
  Filled 2022-04-26: qty 10

## 2022-04-26 MED ORDER — HEPARIN SOD (PORK) LOCK FLUSH 100 UNIT/ML IV SOLN
500.0000 [IU] | Freq: Once | INTRAVENOUS | Status: AC
  Administered 2022-04-26: 500 [IU] via INTRAVENOUS
  Filled 2022-04-26: qty 5

## 2022-07-28 ENCOUNTER — Inpatient Hospital Stay: Payer: Medicare Other | Attending: Internal Medicine

## 2022-07-28 ENCOUNTER — Encounter: Payer: Self-pay | Admitting: Internal Medicine

## 2022-07-28 ENCOUNTER — Inpatient Hospital Stay (HOSPITAL_BASED_OUTPATIENT_CLINIC_OR_DEPARTMENT_OTHER): Payer: Medicare Other | Admitting: Internal Medicine

## 2022-07-28 VITALS — BP 165/93 | HR 83 | Temp 97.1°F | Resp 16 | Wt 173.8 lb

## 2022-07-28 DIAGNOSIS — Z86000 Personal history of in-situ neoplasm of breast: Secondary | ICD-10-CM | POA: Diagnosis not present

## 2022-07-28 DIAGNOSIS — Z923 Personal history of irradiation: Secondary | ICD-10-CM | POA: Insufficient documentation

## 2022-07-28 DIAGNOSIS — Z800 Family history of malignant neoplasm of digestive organs: Secondary | ICD-10-CM | POA: Insufficient documentation

## 2022-07-28 DIAGNOSIS — Z9221 Personal history of antineoplastic chemotherapy: Secondary | ICD-10-CM | POA: Diagnosis not present

## 2022-07-28 DIAGNOSIS — Z85038 Personal history of other malignant neoplasm of large intestine: Secondary | ICD-10-CM

## 2022-07-28 DIAGNOSIS — Z95828 Presence of other vascular implants and grafts: Secondary | ICD-10-CM

## 2022-07-28 DIAGNOSIS — C187 Malignant neoplasm of sigmoid colon: Secondary | ICD-10-CM | POA: Diagnosis not present

## 2022-07-28 DIAGNOSIS — Z86718 Personal history of other venous thrombosis and embolism: Secondary | ICD-10-CM | POA: Insufficient documentation

## 2022-07-28 LAB — CBC WITH DIFFERENTIAL/PLATELET
Abs Immature Granulocytes: 0.02 10*3/uL (ref 0.00–0.07)
Basophils Absolute: 0.1 10*3/uL (ref 0.0–0.1)
Basophils Relative: 1 %
Eosinophils Absolute: 0.2 10*3/uL (ref 0.0–0.5)
Eosinophils Relative: 2 %
HCT: 43.3 % (ref 36.0–46.0)
Hemoglobin: 14.6 g/dL (ref 12.0–15.0)
Immature Granulocytes: 0 %
Lymphocytes Relative: 19 %
Lymphs Abs: 1.4 10*3/uL (ref 0.7–4.0)
MCH: 32.6 pg (ref 26.0–34.0)
MCHC: 33.7 g/dL (ref 30.0–36.0)
MCV: 96.7 fL (ref 80.0–100.0)
Monocytes Absolute: 0.6 10*3/uL (ref 0.1–1.0)
Monocytes Relative: 9 %
Neutro Abs: 5 10*3/uL (ref 1.7–7.7)
Neutrophils Relative %: 69 %
Platelets: 248 10*3/uL (ref 150–400)
RBC: 4.48 MIL/uL (ref 3.87–5.11)
RDW: 12.1 % (ref 11.5–15.5)
WBC: 7.3 10*3/uL (ref 4.0–10.5)
nRBC: 0 % (ref 0.0–0.2)

## 2022-07-28 LAB — COMPREHENSIVE METABOLIC PANEL
ALT: 14 U/L (ref 0–44)
AST: 23 U/L (ref 15–41)
Albumin: 4 g/dL (ref 3.5–5.0)
Alkaline Phosphatase: 100 U/L (ref 38–126)
Anion gap: 8 (ref 5–15)
BUN: 19 mg/dL (ref 8–23)
CO2: 25 mmol/L (ref 22–32)
Calcium: 8.9 mg/dL (ref 8.9–10.3)
Chloride: 105 mmol/L (ref 98–111)
Creatinine, Ser: 0.93 mg/dL (ref 0.44–1.00)
GFR, Estimated: >60 mL/min (ref >60)
Glucose, Bld: 131 mg/dL — ABNORMAL HIGH (ref 70–99)
Potassium: 3.7 mmol/L (ref 3.5–5.1)
Sodium: 138 mmol/L (ref 135–145)
Total Bilirubin: 0.7 mg/dL (ref 0.3–1.2)
Total Protein: 7.9 g/dL (ref 6.5–8.1)

## 2022-07-28 MED ORDER — HEPARIN SOD (PORK) LOCK FLUSH 100 UNIT/ML IV SOLN
500.0000 [IU] | Freq: Once | INTRAVENOUS | Status: AC
  Administered 2022-07-28: 500 [IU] via INTRAVENOUS
  Filled 2022-07-28: qty 5

## 2022-07-28 MED ORDER — SODIUM CHLORIDE 0.9% FLUSH
10.0000 mL | Freq: Once | INTRAVENOUS | Status: AC
  Administered 2022-07-28: 10 mL via INTRAVENOUS
  Filled 2022-07-28: qty 10

## 2022-07-28 NOTE — Progress Notes (Unsigned)
Patient denies new problems/concerns today.   °

## 2022-07-28 NOTE — Progress Notes (Unsigned)
[place] OFFICE PROGRESS NOTE  Patient Care Team: Jinny Sanders, MD as PCP - General Clent Jacks, RN as Oncology Nurse Navigator   Cancer Staging  No matching staging information was found for the patient.   Oncology History Overview Note  # 2005- COLON CA [Stage II- left sided; s/p Surgery in [facility] [place]]; no Adj chemo.    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    # Nov 2010-LLE DVT [on Tam] on coumadin; AUG 2017- STOP coumadin  #August 2020-endometrial biopsy-Dr. Fransisca Connors negative for malignancy.  # ; PN-G-1; port flush q 6 w   # Left kidney cyst [Feb 2017]  # colonoscopy- 2019 [Dr.Mann; [place]]  DIAGNOSIS: METASTATIC COLON CA  STAGE: IV; NED        ;GOALS: control  CURRENT/MOST RECENT THERAPY: surveillance    History of colon cancer      INTERVAL HISTORY: Ambulating with a cane.  Alone.  Susan Duffy 76 y.o.  female pleasant patient above history of metastatic colon cancer currently on surveillance is here for follow-up.  No worsening aches and pains.  No vaginal bleeding.  No nausea vomiting headache.  No blood in stools or black or stools.  Review of Systems  Constitutional:  Negative for chills, diaphoresis, fever, malaise/fatigue and weight loss.  HENT:  Negative for nosebleeds and sore throat.   Eyes:  Negative for double vision.  Respiratory:  Negative for cough, hemoptysis, sputum production, shortness of breath and wheezing.   Cardiovascular:  Negative for chest pain, palpitations, orthopnea and leg swelling.  Gastrointestinal:  Negative for abdominal pain, blood in stool, constipation, diarrhea, heartburn, melena, nausea and vomiting.  Genitourinary:  Negative for dysuria,  frequency and urgency.  Musculoskeletal:  Positive for back pain and joint pain.  Skin: Negative.  Negative for itching and rash.  Neurological:  Negative for dizziness, tingling, focal weakness, weakness and headaches.  Endo/Heme/Allergies:  Does not bruise/bleed easily.  Psychiatric/Behavioral:  Negative for depression. The patient is not nervous/anxious and does not have insomnia.       PAST MEDICAL HISTORY :  Past Medical History:  Diagnosis Date   Allergy    Breast CA ([place])    Breast cancer ([place]) 2010   in situ and took radiation, no chemo needed   Colon cancer ([place])    stage 4 colon cancer   History of chemotherapy 02/2011-08/2011   FOLFOX/AVASTIN- 8 CYCLES   History of DVT (deep vein thrombosis) 2010   while taking tamoxifen-left lower extremity   History of peripheral neuropathy    Hyperlipidemia    Hypertension    IDA (iron deficiency anemia)    Osteoarthritis    Personal history of chemotherapy    colon ca   Personal history of radiation therapy    breast and colon   TIA (transient ischemic attack) 03/08/2011   Vitamin D deficiency     PAST SURGICAL HISTORY :   Past Surgical History:  Procedure Laterality Date   BREAST EXCISIONAL BIOPSY Left 12/2008   s/p lumpectomy and radiation left dcis   BREAST EXCISIONAL BIOPSY Left    fibroadeoma reoved age 1   BREAST LUMPECTOMY  2010   left breast   broken ankle  Left 08/2012  COLON SURGERY     colectomy, partial   INTRACAPSULAR CATARACT EXTRACTION Bilateral    LOBECTOMY  06/2009   metastatic colon cancer    FAMILY HISTORY :   Family History  Problem Relation Age of Onset   Hypertension Mother    Stroke Mother    Coronary artery disease Mother    Thyroid disease Mother    Diabetes Father    Hypertension Father    Lung cancer Father    Heart attack Maternal Grandfather    Breast cancer Maternal Grandmother    Thyroid disease Brother    Diabetes Paternal Grandmother    Colon cancer Paternal Grandfather     Deep vein thrombosis Other     SOCIAL HISTORY:   Social History   Tobacco Use   Smoking status: Never   Smokeless tobacco: Never  Vaping Use   Vaping Use: Never used  Substance Use Topics   Alcohol use: No   Drug use: No    ALLERGIES:  is allergic to amoxicillin, erythromycin, and nitrofuran derivatives.  MEDICATIONS:  Current Outpatient Medications  Medication Sig Dispense Refill   acetaminophen (TYLENOL) 500 MG tablet as needed for moderate pain. Take 1-2 by mouth every 6 hours as needed      aspirin 81 MG chewable tablet Chew 81 mg by mouth daily.     cholecalciferol (VITAMIN D) 400 UNITS TABS tablet Take 400 Units by mouth 2 (two) times daily.     Cyanocobalamin (VITAMIN B-12) 2000 MCG TBCR Take 1 tablet by mouth daily.     lidocaine-prilocaine (EMLA) cream Apply 1 application topically as needed. To port a cath site approximately 1 to 2 hours prior to port needle insertion. 30 g 12   losartan (COZAAR) 50 MG tablet TAKE 1 TABLET BY MOUTH EVERY DAY 90 tablet 1   simvastatin (ZOCOR) 40 MG tablet TAKE 1 TABLET BY MOUTH EVERYDAY AT BEDTIME 90 tablet 3   No current facility-administered medications for this visit.   Facility-Administered Medications Ordered in Other Visits  Medication Dose Route Frequency Provider Last Rate Last Admin   sodium chloride flush (NS) 0.9 % injection 10 mL  10 mL Intravenous PRN Cammie Sickle, MD   10 mL at 01/05/16 1135    PHYSICAL EXAMINATION: ECOG PERFORMANCE STATUS: 0 - Asymptomatic  BP (!) 165/93 (BP Location: Right Arm, Patient Position: Sitting)   Pulse 83   Temp (!) 97.1 F (36.2 C) (Tympanic)   Resp 16   Wt 173 lb 12.8 oz (78.8 kg)   BMI 31.79 kg/m   Filed Weights   07/28/22 1000  Weight: 173 lb 12.8 oz (78.8 kg)   Physical Exam Constitutional:      Comments: Obese.  Walks by herself.  She is alone.  HENT:     Head: Normocephalic and atraumatic.     Mouth/Throat:     Pharynx: No oropharyngeal exudate.  Eyes:      Pupils: Pupils are equal, round, and reactive to light.  Cardiovascular:     Rate and Rhythm: Normal rate and regular rhythm.  Pulmonary:     Effort: No respiratory distress.     Breath sounds: No wheezing.  Abdominal:     General: Bowel sounds are normal. There is no distension.     Palpations: Abdomen is soft. There is no mass.     Tenderness: There is no abdominal tenderness. There is no guarding or rebound.  Musculoskeletal:        General: No tenderness. Normal  range of motion.     Cervical back: Normal range of motion and neck supple.  Skin:    General: Skin is warm.  Neurological:     Mental Status: She is alert and oriented to person, place, and time.  Psychiatric:        Mood and Affect: Affect normal.     SKIN:  no rashes or significant lesions    LABORATORY DATA:  I have reviewed the data as listed    Component Value Date/Time   NA 138 07/28/2022 1013   NA 141 12/20/2013 0901   K 3.7 07/28/2022 1013   K 4.1 12/20/2013 0901   CL 105 07/28/2022 1013   CL 106 12/18/2012 0842   CO2 25 07/28/2022 1013   CO2 27 12/20/2013 0901   GLUCOSE 131 (H) 07/28/2022 1013   GLUCOSE 103 12/20/2013 0901   GLUCOSE 109 (H) 12/18/2012 0842   BUN 19 07/28/2022 1013   BUN 23.2 12/20/2013 0901   CREATININE 0.93 07/28/2022 1013   CREATININE 1.04 12/31/2014 1023   CREATININE 1.0 12/20/2013 0901   CALCIUM 8.9 07/28/2022 1013   CALCIUM 10.1 12/20/2013 0901   PROT 7.9 07/28/2022 1013   PROT 7.9 12/31/2014 1023   PROT 8.5 (H) 12/20/2013 0901   ALBUMIN 4.0 07/28/2022 1013   ALBUMIN 3.3 (L) 12/31/2014 1023   ALBUMIN 3.7 12/20/2013 0901   AST 23 07/28/2022 1013   AST 24 12/31/2014 1023   AST 21 12/20/2013 0901   ALT 14 07/28/2022 1013   ALT 28 12/31/2014 1023   ALT 18 12/20/2013 0901   ALKPHOS 100 07/28/2022 1013   ALKPHOS 135 (H) 12/31/2014 1023   ALKPHOS 144 12/20/2013 0901   BILITOT 0.7 07/28/2022 1013   BILITOT 0.4 12/31/2014 1023   BILITOT 0.60 12/20/2013 0901    GFRNONAA >60 07/28/2022 1013   GFRNONAA 56 (L) 12/31/2014 1023   GFRNONAA 57 (L) 06/24/2014 1300   GFRAA >60 07/30/2020 1009   GFRAA >60 12/31/2014 1023   GFRAA >60 06/24/2014 1300    No results found for: "SPEP", "UPEP"  Lab Results  Component Value Date   WBC 7.3 07/28/2022   NEUTROABS 5.0 07/28/2022   HGB 14.6 07/28/2022   HCT 43.3 07/28/2022   MCV 96.7 07/28/2022   PLT 248 07/28/2022      Chemistry      Component Value Date/Time   NA 138 07/28/2022 1013   NA 141 12/20/2013 0901   K 3.7 07/28/2022 1013   K 4.1 12/20/2013 0901   CL 105 07/28/2022 1013   CL 106 12/18/2012 0842   CO2 25 07/28/2022 1013   CO2 27 12/20/2013 0901   BUN 19 07/28/2022 1013   BUN 23.2 12/20/2013 0901   CREATININE 0.93 07/28/2022 1013   CREATININE 1.04 12/31/2014 1023   CREATININE 1.0 12/20/2013 0901      Component Value Date/Time   CALCIUM 8.9 07/28/2022 1013   CALCIUM 10.1 12/20/2013 0901   ALKPHOS 100 07/28/2022 1013   ALKPHOS 135 (H) 12/31/2014 1023   ALKPHOS 144 12/20/2013 0901   AST 23 07/28/2022 1013   AST 24 12/31/2014 1023   AST 21 12/20/2013 0901   ALT 14 07/28/2022 1013   ALT 28 12/31/2014 1023   ALT 18 12/20/2013 0901   BILITOT 0.7 07/28/2022 1013   BILITOT 0.4 12/31/2014 1023   BILITOT 0.60 12/20/2013 0901       RADIOGRAPHIC STUDIES: I have personally reviewed the radiological images as listed and agreed with the findings  in the report. No results found.   ASSESSMENT & PLAN:  No problem-specific Assessment & Plan notes found for this encounter.   Orders Placed This Encounter  Procedures   CBC with Differential/Platelet    Standing Status:   Future    Standing Expiration Date:   07/29/2023   Comprehensive metabolic panel    Standing Status:   Future    Standing Expiration Date:   07/29/2023   CEA    Standing Status:   Future    Standing Expiration Date:   07/29/2023    All questions were answered. The patient knows to call the clinic with any problems,  questions or concerns.      Cammie Sickle, MD 07/28/2022 1:51 PM

## 2022-07-29 LAB — CEA: CEA: 2 ng/mL (ref 0.0–4.7)

## 2022-08-18 ENCOUNTER — Encounter: Payer: Self-pay | Admitting: Family Medicine

## 2022-08-18 ENCOUNTER — Ambulatory Visit (INDEPENDENT_AMBULATORY_CARE_PROVIDER_SITE_OTHER): Payer: Medicare Other | Admitting: Family Medicine

## 2022-08-18 VITALS — BP 120/84 | HR 82 | Temp 98.6°F | Ht 62.0 in | Wt 176.0 lb

## 2022-08-18 DIAGNOSIS — M79604 Pain in right leg: Secondary | ICD-10-CM | POA: Insufficient documentation

## 2022-08-18 NOTE — Patient Instructions (Signed)
Use tylenol for pain, elevate leg when rest, start home stretching.  Call if  new redness, , swelling, increasing pain or not improving as expected.  Use cane for stability.

## 2022-08-18 NOTE — Progress Notes (Signed)
Patient ID: Susan Duffy, female    DOB: Mar 13, 1946, 76 y.o.   MRN: [id number]  This visit was conducted in person.  BP 120/84   Pulse 82   Temp 98.6 F (37 C) (Oral)   Ht 5\' 2"  (1.575 m)   Wt 176 lb (79.8 kg)   SpO2 96%   BMI 32.19 kg/m   CC:  Chief Complaint  Patient presents with   Leg Pain    X 1 1/2 weeks no injury or swelling. Painful with walking or putting weight on it.     Subjective:   HPI: Susan Duffy is a 76 y.o. female  with history of peripheral neuropathy, hx of DVT 2012 after breast cancer /chemo presenting on 08/18/2022 for Leg Pain (X 1 1/2 weeks no injury or swelling. Painful with walking or putting weight on it. )   She reports sudden onset of pain in right calf.. worse with weight bearing and walking. PAin runs 2to 9  of of 10 pain on scale No swelling.  No fall, no change in activity.  No redness, no heat.  No  SOB. No fever, feels well overall.   No low back pain.   Last DEXA 2014 osteopenia.Marland Kitchen DEXA pending.  On ASA 81 mg daily.  Has been less active given heat.       Relevant past medical, surgical, family and social history reviewed and updated as indicated. Interim medical history since our last visit reviewed. Allergies and medications reviewed and updated. Outpatient Medications Prior to Visit  Medication Sig Dispense Refill   acetaminophen (TYLENOL) 500 MG tablet as needed for moderate pain. Take 1-2 by mouth every 6 hours as needed      aspirin 81 MG chewable tablet Chew 81 mg by mouth daily.     cholecalciferol (VITAMIN D) 400 UNITS TABS tablet Take 400 Units by mouth 2 (two) times daily.     Cyanocobalamin (VITAMIN B-12) 2000 MCG TBCR Take 1 tablet by mouth daily.     lidocaine-prilocaine (EMLA) cream Apply 1 application topically as needed. To port a cath site approximately 1 to 2 hours prior to port needle insertion. 30 g 12   losartan (COZAAR) 50 MG tablet TAKE 1 TABLET BY MOUTH EVERY DAY 90 tablet 1    simvastatin (ZOCOR) 40 MG tablet TAKE 1 TABLET BY MOUTH EVERYDAY AT BEDTIME 90 tablet 3   Facility-Administered Medications Prior to Visit  Medication Dose Route Frequency Provider Last Rate Last Admin   sodium chloride flush (NS) 0.9 % injection 10 mL  10 mL Intravenous PRN Cammie Sickle, MD   10 mL at 01/05/16 1135     Per HPI unless specifically indicated in ROS section below Review of Systems  Constitutional:  Negative for fatigue and fever.  HENT:  Negative for ear pain.   Eyes:  Negative for pain.  Respiratory:  Negative for chest tightness and shortness of breath.   Cardiovascular:  Negative for chest pain, palpitations and leg swelling.  Gastrointestinal:  Negative for abdominal pain.  Genitourinary:  Negative for dysuria.   Objective:  BP 120/84   Pulse 82   Temp 98.6 F (37 C) (Oral)   Ht 5\' 2"  (1.575 m)   Wt 176 lb (79.8 kg)   SpO2 96%   BMI 32.19 kg/m   Wt Readings from Last 3 Encounters:  08/18/22 176 lb (79.8 kg)  07/28/22 173 lb 12.8 oz (78.8 kg)  04/13/22 172 lb 14.4 oz (78.4 kg)  Physical Exam Constitutional:      General: She is not in acute distress.    Appearance: Normal appearance. She is well-developed. She is not ill-appearing or toxic-appearing.  HENT:     Head: Normocephalic.     Right Ear: Hearing, tympanic membrane, ear canal and external ear normal. Tympanic membrane is not erythematous, retracted or bulging.     Left Ear: Hearing, tympanic membrane, ear canal and external ear normal. Tympanic membrane is not erythematous, retracted or bulging.     Nose: No mucosal edema or rhinorrhea.     Right Sinus: No maxillary sinus tenderness or frontal sinus tenderness.     Left Sinus: No maxillary sinus tenderness or frontal sinus tenderness.     Mouth/Throat:     Pharynx: Uvula midline.  Eyes:     General: Lids are normal. Lids are everted, no foreign bodies appreciated.     Conjunctiva/sclera: Conjunctivae normal.     Pupils: Pupils  are equal, round, and reactive to light.  Neck:     Thyroid: No thyroid mass or thyromegaly.     Vascular: No carotid bruit.     Trachea: Trachea normal.  Cardiovascular:     Rate and Rhythm: Normal rate and regular rhythm.     Pulses: Normal pulses.     Heart sounds: Normal heart sounds, S1 normal and S2 normal. No murmur heard.    No friction rub. No gallop.     Comments: Has chronic varicosities on left leg, mild nonpitting swelling chronically... right leg where pain is is not swollen, no ttp, no pain with ROM Pulmonary:     Effort: Pulmonary effort is normal. No tachypnea or respiratory distress.     Breath sounds: Normal breath sounds. No decreased breath sounds, wheezing, rhonchi or rales.  Abdominal:     General: Bowel sounds are normal.     Palpations: Abdomen is soft.     Tenderness: There is no abdominal tenderness.  Musculoskeletal:     Cervical back: Normal range of motion and neck supple.     Right lower leg: No edema.     Left lower leg: Edema present.  Skin:    General: Skin is warm and dry.     Findings: No rash.  Neurological:     Mental Status: She is alert.  Psychiatric:        Mood and Affect: Mood is not anxious or depressed.        Speech: Speech normal.        Behavior: Behavior normal. Behavior is cooperative.        Thought Content: Thought content normal.        Judgment: Judgment normal.       Results for orders placed or performed in visit on 07/28/22  Comprehensive metabolic panel  Result Value Ref Range   Sodium 138 135 - 145 mmol/L   Potassium 3.7 3.5 - 5.1 mmol/L   Chloride 105 98 - 111 mmol/L   CO2 25 22 - 32 mmol/L   Glucose, Bld 131 (H) 70 - 99 mg/dL   BUN 19 8 - 23 mg/dL   Creatinine, Ser 0.93 0.44 - 1.00 mg/dL   Calcium 8.9 8.9 - 10.3 mg/dL   Total Protein 7.9 6.5 - 8.1 g/dL   Albumin 4.0 3.5 - 5.0 g/dL   AST 23 15 - 41 U/L   ALT 14 0 - 44 U/L   Alkaline Phosphatase 100 38 - 126 U/L   Total Bilirubin 0.7 0.3 -  1.2 mg/dL   GFR,  Estimated >60 >60 mL/min   Anion gap 8 5 - 15  CEA  Result Value Ref Range   CEA 2.0 0.0 - 4.7 ng/mL  CBC with Differential/Platelet  Result Value Ref Range   WBC 7.3 4.0 - 10.5 K/uL   RBC 4.48 3.87 - 5.11 MIL/uL   Hemoglobin 14.6 12.0 - 15.0 g/dL   HCT 43.3 36.0 - 46.0 %   MCV 96.7 80.0 - 100.0 fL   MCH 32.6 26.0 - 34.0 pg   MCHC 33.7 30.0 - 36.0 g/dL   RDW 12.1 11.5 - 15.5 %   Platelets 248 150 - 400 K/uL   nRBC 0.0 0.0 - 0.2 %   Neutrophils Relative % 69 %   Neutro Abs 5.0 1.7 - 7.7 K/uL   Lymphocytes Relative 19 %   Lymphs Abs 1.4 0.7 - 4.0 K/uL   Monocytes Relative 9 %   Monocytes Absolute 0.6 0.1 - 1.0 K/uL   Eosinophils Relative 2 %   Eosinophils Absolute 0.2 0.0 - 0.5 K/uL   Basophils Relative 1 %   Basophils Absolute 0.1 0.0 - 0.1 K/uL   Immature Granulocytes 0 %   Abs Immature Granulocytes 0.02 0.00 - 0.07 K/uL     COVID 19 screen:  No recent travel or known exposure to COVID19 The patient denies respiratory symptoms of COVID 19 at this time. The importance of social distancing was discussed today.   Assessment and Plan    Problem List Items Addressed This Visit     Right leg pain - Primary    Acute, pain is only with active movement.  No pain passively or tenderness to palpation.  No clear sign of DVT, superficial phlebitis, bone fracture or bruising. No skin changes suggesting  Cellulitis.  This likely musculoskeletal strain.  Treat with elevation, Tylenol, gentle stretching.  Reviewed ER and return precautions.  She will let me know if its not improving as expected.        [name], MD

## 2022-08-18 NOTE — Assessment & Plan Note (Signed)
Acute, pain is only with active movement.  No pain passively or tenderness to palpation.  No clear sign of DVT, superficial phlebitis, bone fracture or bruising. No skin changes suggesting  Cellulitis.  This likely musculoskeletal strain.  Treat with elevation, Tylenol, gentle stretching.  Reviewed ER and return precautions.  She will let me know if its not improving as expected.

## 2022-08-21 ENCOUNTER — Other Ambulatory Visit: Payer: Self-pay | Admitting: Family Medicine

## 2022-09-09 DIAGNOSIS — Z23 Encounter for immunization: Secondary | ICD-10-CM | POA: Diagnosis not present

## 2022-09-23 ENCOUNTER — Telehealth: Payer: Self-pay | Admitting: Family Medicine

## 2022-09-23 DIAGNOSIS — E1159 Type 2 diabetes mellitus with other circulatory complications: Secondary | ICD-10-CM

## 2022-09-23 NOTE — Telephone Encounter (Signed)
-----   Message from Ellamae Sia sent at 09/23/2022 10:10 AM EDT ----- Regarding: Lab orders for Thursday, 11.2.23 Lab orders , thanks

## 2022-10-07 ENCOUNTER — Other Ambulatory Visit (INDEPENDENT_AMBULATORY_CARE_PROVIDER_SITE_OTHER): Payer: Medicare Other

## 2022-10-07 DIAGNOSIS — E1159 Type 2 diabetes mellitus with other circulatory complications: Secondary | ICD-10-CM

## 2022-10-07 LAB — HEMOGLOBIN A1C: Hgb A1c MFr Bld: 5.8 % (ref 4.6–6.5)

## 2022-10-07 LAB — COMPREHENSIVE METABOLIC PANEL
ALT: 13 U/L (ref 0–35)
AST: 19 U/L (ref 0–37)
Albumin: 4 g/dL (ref 3.5–5.2)
Alkaline Phosphatase: 108 U/L (ref 39–117)
BUN: 22 mg/dL (ref 6–23)
CO2: 29 mEq/L (ref 19–32)
Calcium: 9.2 mg/dL (ref 8.4–10.5)
Chloride: 105 mEq/L (ref 96–112)
Creatinine, Ser: 0.81 mg/dL (ref 0.40–1.20)
GFR: 69.5 mL/min (ref >60.00)
Glucose, Bld: 109 mg/dL — ABNORMAL HIGH (ref 70–99)
Potassium: 4.7 mEq/L (ref 3.5–5.1)
Sodium: 140 mEq/L (ref 135–145)
Total Bilirubin: 0.5 mg/dL (ref 0.2–1.2)
Total Protein: 7.5 g/dL (ref 6.0–8.3)

## 2022-10-07 LAB — LIPID PANEL
Cholesterol: 131 mg/dL (ref 0–200)
HDL: 48.6 mg/dL (ref >39.00)
LDL Cholesterol: 63 mg/dL (ref 0–99)
NonHDL: 81.94
Total CHOL/HDL Ratio: 3
Triglycerides: 97 mg/dL (ref 0.0–149.0)
VLDL: 19.4 mg/dL (ref 0.0–40.0)

## 2022-10-07 NOTE — Progress Notes (Signed)
No critical labs need to be addressed urgently. We will discuss labs in detail at upcoming office visit.   

## 2022-10-14 ENCOUNTER — Ambulatory Visit (INDEPENDENT_AMBULATORY_CARE_PROVIDER_SITE_OTHER): Payer: Medicare Other | Admitting: Family Medicine

## 2022-10-14 ENCOUNTER — Encounter: Payer: Self-pay | Admitting: Family Medicine

## 2022-10-14 ENCOUNTER — Other Ambulatory Visit: Payer: Self-pay | Admitting: Family Medicine

## 2022-10-14 VITALS — BP 120/70 | HR 75 | Temp 97.8°F | Ht 62.0 in | Wt 176.2 lb

## 2022-10-14 DIAGNOSIS — E1169 Type 2 diabetes mellitus with other specified complication: Secondary | ICD-10-CM | POA: Diagnosis not present

## 2022-10-14 DIAGNOSIS — I152 Hypertension secondary to endocrine disorders: Secondary | ICD-10-CM | POA: Diagnosis not present

## 2022-10-14 DIAGNOSIS — E118 Type 2 diabetes mellitus with unspecified complications: Secondary | ICD-10-CM | POA: Diagnosis not present

## 2022-10-14 DIAGNOSIS — E1159 Type 2 diabetes mellitus with other circulatory complications: Secondary | ICD-10-CM | POA: Diagnosis not present

## 2022-10-14 DIAGNOSIS — E785 Hyperlipidemia, unspecified: Secondary | ICD-10-CM

## 2022-10-14 DIAGNOSIS — Z1231 Encounter for screening mammogram for malignant neoplasm of breast: Secondary | ICD-10-CM

## 2022-10-14 NOTE — Progress Notes (Signed)
Patient ID: [name], female    DOB: 1946-07-26, 76 y.o.   MRN: [id number]  This visit was conducted in person.  BP 120/70   Pulse 75   Temp 97.8 F (36.6 C) (Oral)   Ht 5\' 2"  (1.575 m)   Wt 176 lb 4 oz (79.9 kg)   SpO2 94%   BMI 32.24 kg/m    CC:  Chief Complaint  Patient presents with   Diabetes    Subjective:   HPI: Susan Duffy is a 76 y.o. female presenting on 10/14/2022 for Diabetes  Reviewed recent office visit notes from Dr. Rogue Bussing most recent July 28, 2022 regarding colon cancer history. CEA normal.   history of peripheral neuropathy, hx of DVT 2012 after breast cancer /chemo    Recent leg pain has resolved, only occ painful. Massage has helped.  Diabetes: Well-controlled with diet Lab Results  Component Value Date   HGBA1C 5.8 10/07/2022  Using medications without difficulties: Hypoglycemic episodes: Hyperglycemic episodes:?? Feet problems: no ulcers Blood Sugars averaging: not checking eye exam within last year: yes  Elevated Cholesterol: LDL at goal less than 70 on simvastatin 40 mg daily. Lab Results  Component Value Date   CHOL 131 10/07/2022   HDL 48.60 10/07/2022   LDLCALC 63 10/07/2022   LDLDIRECT 74.0 11/14/2018   TRIG 97.0 10/07/2022   CHOLHDL 3 10/07/2022  Using medications without problems: none Muscle aches:  none Diet compliance: healthy Exercise: walking daily Other complaints:  Hypertension:  At goal on losartan 50 mg p.o. daily BP Readings from Last 3 Encounters:  10/14/22 120/70  08/18/22 120/84  07/28/22 (!) 165/93  Using medication without problems or lightheadedness:  none Chest pain with exertion: none Edema: none Short of breath: stable SOB with hills Average home BPs: Other issues:      Relevant past medical, surgical, family and social history reviewed and updated as indicated. Interim medical history since our last visit reviewed. Allergies and medications reviewed and  updated. Outpatient Medications Prior to Visit  Medication Sig Dispense Refill   acetaminophen (TYLENOL) 500 MG tablet as needed for moderate pain. Take 1-2 by mouth every 6 hours as needed      aspirin 81 MG chewable tablet Chew 81 mg by mouth daily.     cholecalciferol (VITAMIN D) 400 UNITS TABS tablet Take 400 Units by mouth 2 (two) times daily.     Cyanocobalamin (VITAMIN B-12) 2000 MCG TBCR Take 1 tablet by mouth daily.     lidocaine-prilocaine (EMLA) cream Apply 1 application topically as needed. To port a cath site approximately 1 to 2 hours prior to port needle insertion. 30 g 12   losartan (COZAAR) 50 MG tablet TAKE 1 TABLET BY MOUTH EVERY DAY 90 tablet 1   simvastatin (ZOCOR) 40 MG tablet TAKE 1 TABLET BY MOUTH EVERYDAY AT BEDTIME 90 tablet 3   Facility-Administered Medications Prior to Visit  Medication Dose Route Frequency Provider Last Rate Last Admin   sodium chloride flush (NS) 0.9 % injection 10 mL  10 mL Intravenous PRN Cammie Sickle, MD   10 mL at 01/05/16 1135     Per HPI unless specifically indicated in ROS section below Review of Systems  Constitutional:  Negative for fatigue and fever.  HENT:  Negative for congestion.   Eyes:  Negative for pain.  Respiratory:  Negative for cough and shortness of breath.   Cardiovascular:  Negative for chest pain, palpitations and leg swelling.  Gastrointestinal:  Negative  for abdominal pain.  Genitourinary:  Negative for dysuria and vaginal bleeding.  Musculoskeletal:  Negative for back pain.  Neurological:  Negative for syncope, light-headedness and headaches.  Psychiatric/Behavioral:  Negative for dysphoric mood.    Objective:  BP 120/70   Pulse 75   Temp 97.8 F (36.6 C) (Oral)   Ht 5\' 2"  (1.575 m)   Wt 176 lb 4 oz (79.9 kg)   SpO2 94%   BMI 32.24 kg/m   Wt Readings from Last 3 Encounters:  10/14/22 176 lb 4 oz (79.9 kg)  08/18/22 176 lb (79.8 kg)  07/28/22 173 lb 12.8 oz (78.8 kg)      Physical  Exam Constitutional:      General: She is not in acute distress.    Appearance: Normal appearance. She is well-developed. She is obese. She is not ill-appearing or toxic-appearing.  HENT:     Head: Normocephalic.     Right Ear: Hearing, tympanic membrane, ear canal and external ear normal. Tympanic membrane is not erythematous, retracted or bulging.     Left Ear: Hearing, tympanic membrane, ear canal and external ear normal. Tympanic membrane is not erythematous, retracted or bulging.     Nose: No mucosal edema or rhinorrhea.     Right Sinus: No maxillary sinus tenderness or frontal sinus tenderness.     Left Sinus: No maxillary sinus tenderness or frontal sinus tenderness.     Mouth/Throat:     Pharynx: Uvula midline.  Eyes:     General: Lids are normal. Lids are everted, no foreign bodies appreciated.     Conjunctiva/sclera: Conjunctivae normal.     Pupils: Pupils are equal, round, and reactive to light.  Neck:     Thyroid: No thyroid mass or thyromegaly.     Vascular: No carotid bruit.     Trachea: Trachea normal.  Cardiovascular:     Rate and Rhythm: Normal rate and regular rhythm.     Pulses: Normal pulses.     Heart sounds: Normal heart sounds, S1 normal and S2 normal. No murmur heard.    No friction rub. No gallop.  Pulmonary:     Effort: Pulmonary effort is normal. No tachypnea or respiratory distress.     Breath sounds: Normal breath sounds. No decreased breath sounds, wheezing, rhonchi or rales.  Abdominal:     General: Bowel sounds are normal.     Palpations: Abdomen is soft.     Tenderness: There is no abdominal tenderness.  Musculoskeletal:     Cervical back: Normal range of motion and neck supple.  Skin:    General: Skin is warm and dry.     Findings: No rash.  Neurological:     Mental Status: She is alert.  Psychiatric:        Mood and Affect: Mood is not anxious or depressed.        Speech: Speech normal.        Behavior: Behavior normal. Behavior is  cooperative.        Thought Content: Thought content normal.        Judgment: Judgment normal.       Results for orders placed or performed in visit on 10/07/22  Comprehensive metabolic panel  Result Value Ref Range   Sodium 140 135 - 145 mEq/L   Potassium 4.7 3.5 - 5.1 mEq/L   Chloride 105 96 - 112 mEq/L   CO2 29 19 - 32 mEq/L   Glucose, Bld 109 (H) 70 - 99 mg/dL  BUN 22 6 - 23 mg/dL   Creatinine, Ser 0.81 0.40 - 1.20 mg/dL   Total Bilirubin 0.5 0.2 - 1.2 mg/dL   Alkaline Phosphatase 108 39 - 117 U/L   AST 19 0 - 37 U/L   ALT 13 0 - 35 U/L   Total Protein 7.5 6.0 - 8.3 g/dL   Albumin 4.0 3.5 - 5.2 g/dL   GFR 69.50 >60.00 mL/min   Calcium 9.2 8.4 - 10.5 mg/dL  Lipid panel  Result Value Ref Range   Cholesterol 131 0 - 200 mg/dL   Triglycerides 97.0 0.0 - 149.0 mg/dL   HDL 48.60 >39.00 mg/dL   VLDL 19.4 0.0 - 40.0 mg/dL   LDL Cholesterol 63 0 - 99 mg/dL   Total CHOL/HDL Ratio 3    NonHDL 81.94   Hemoglobin A1c  Result Value Ref Range   Hgb A1c MFr Bld 5.8 4.6 - 6.5 %     COVID 19 screen:  No recent travel or known exposure to COVID19 The patient denies respiratory symptoms of COVID 19 at this time. The importance of social distancing was discussed today.   Assessment and Plan  Problem List Items Addressed This Visit     Hyperlipidemia associated with type 2 diabetes mellitus ([place]) (Chronic)    Stable, chronic.  Continue current medication.  Simvastatin 40 mg p.o. daily       Hypertension associated with diabetes ([facility]) (Chronic)    Stable, chronic.  Continue current medication.  Losartan 50 mg p.o. daily      Controlled diabetes mellitus type 2 with complications ([place])    Associated with HTN and past TIA Well controlled with diet.      Other Visit Diagnoses     Type 2 diabetes mellitus with other circulatory complication, without long-term current use of insulin ([place])    -  Primary        [name], MD

## 2022-10-14 NOTE — Assessment & Plan Note (Signed)
Stable, chronic.  Continue current medication.  Losartan 50 mg p.o. daily

## 2022-10-14 NOTE — Assessment & Plan Note (Signed)
Stable, chronic.  Continue current medication.  Simvastatin 40 mg p.o. daily

## 2022-10-14 NOTE — Assessment & Plan Note (Signed)
Associated with HTN and past TIA Well controlled with diet.

## 2022-10-18 ENCOUNTER — Ambulatory Visit
Admission: RE | Admit: 2022-10-18 | Discharge: 2022-10-18 | Disposition: A | Discharge status: Home / Self Care | Payer: Medicare Other | Source: Ambulatory Visit | Attending: Family Medicine | Admitting: Family Medicine

## 2022-10-18 DIAGNOSIS — Z1231 Encounter for screening mammogram for malignant neoplasm of breast: Secondary | ICD-10-CM | POA: Insufficient documentation

## 2022-10-18 DIAGNOSIS — Z13820 Encounter for screening for osteoporosis: Secondary | ICD-10-CM | POA: Diagnosis not present

## 2022-10-18 DIAGNOSIS — Z780 Asymptomatic menopausal state: Secondary | ICD-10-CM | POA: Diagnosis not present

## 2022-10-18 DIAGNOSIS — M85851 Other specified disorders of bone density and structure, right thigh: Secondary | ICD-10-CM | POA: Diagnosis not present

## 2023-01-25 DIAGNOSIS — Z961 Presence of intraocular lens: Secondary | ICD-10-CM | POA: Diagnosis not present

## 2023-01-25 LAB — HM DIABETES EYE EXAM

## 2023-01-28 ENCOUNTER — Inpatient Hospital Stay (HOSPITAL_BASED_OUTPATIENT_CLINIC_OR_DEPARTMENT_OTHER): Payer: Medicare Other | Admitting: Internal Medicine

## 2023-01-28 ENCOUNTER — Encounter: Payer: Self-pay | Admitting: Internal Medicine

## 2023-01-28 ENCOUNTER — Inpatient Hospital Stay: Payer: Medicare Other | Attending: Internal Medicine

## 2023-01-28 VITALS — BP 129/86 | HR 83 | Temp 97.8°F | Resp 16 | Wt 172.1 lb

## 2023-01-28 DIAGNOSIS — Z85038 Personal history of other malignant neoplasm of large intestine: Secondary | ICD-10-CM | POA: Insufficient documentation

## 2023-01-28 DIAGNOSIS — R739 Hyperglycemia, unspecified: Secondary | ICD-10-CM | POA: Insufficient documentation

## 2023-01-28 DIAGNOSIS — Z86000 Personal history of in-situ neoplasm of breast: Secondary | ICD-10-CM | POA: Diagnosis not present

## 2023-01-28 DIAGNOSIS — Z95828 Presence of other vascular implants and grafts: Secondary | ICD-10-CM

## 2023-01-28 LAB — COMPREHENSIVE METABOLIC PANEL
ALT: 22 U/L (ref 0–44)
AST: 27 U/L (ref 15–41)
Albumin: 3.6 g/dL (ref 3.5–5.0)
Alkaline Phosphatase: 94 U/L (ref 38–126)
Anion gap: 12 (ref 5–15)
BUN: 27 mg/dL — ABNORMAL HIGH (ref 8–23)
CO2: 22 mmol/L (ref 22–32)
Calcium: 8.8 mg/dL — ABNORMAL LOW (ref 8.9–10.3)
Chloride: 103 mmol/L (ref 98–111)
Creatinine, Ser: 1.15 mg/dL — ABNORMAL HIGH (ref 0.44–1.00)
GFR, Estimated: 49 mL/min — ABNORMAL LOW (ref >60)
Glucose, Bld: 210 mg/dL — ABNORMAL HIGH (ref 70–99)
Potassium: 3.8 mmol/L (ref 3.5–5.1)
Sodium: 137 mmol/L (ref 135–145)
Total Bilirubin: 0.8 mg/dL (ref 0.3–1.2)
Total Protein: 8.1 g/dL (ref 6.5–8.1)

## 2023-01-28 LAB — CBC WITH DIFFERENTIAL/PLATELET
Abs Immature Granulocytes: 0.01 10*3/uL (ref 0.00–0.07)
Basophils Absolute: 0 10*3/uL (ref 0.0–0.1)
Basophils Relative: 1 %
Eosinophils Absolute: 0.1 10*3/uL (ref 0.0–0.5)
Eosinophils Relative: 2 %
HCT: 44 % (ref 36.0–46.0)
Hemoglobin: 14.7 g/dL (ref 12.0–15.0)
Immature Granulocytes: 0 %
Lymphocytes Relative: 21 %
Lymphs Abs: 1.1 10*3/uL (ref 0.7–4.0)
MCH: 32.2 pg (ref 26.0–34.0)
MCHC: 33.4 g/dL (ref 30.0–36.0)
MCV: 96.3 fL (ref 80.0–100.0)
Monocytes Absolute: 0.4 10*3/uL (ref 0.1–1.0)
Monocytes Relative: 8 %
Neutro Abs: 3.7 10*3/uL (ref 1.7–7.7)
Neutrophils Relative %: 68 %
Platelets: 263 10*3/uL (ref 150–400)
RBC: 4.57 MIL/uL (ref 3.87–5.11)
RDW: 11.9 % (ref 11.5–15.5)
WBC: 5.4 10*3/uL (ref 4.0–10.5)
nRBC: 0 % (ref 0.0–0.2)

## 2023-01-28 MED ORDER — SODIUM CHLORIDE 0.9% FLUSH
10.0000 mL | Freq: Once | INTRAVENOUS | Status: AC
  Administered 2023-01-28: 10 mL via INTRAVENOUS
  Filled 2023-01-28: qty 10

## 2023-01-28 MED ORDER — HEPARIN SOD (PORK) LOCK FLUSH 100 UNIT/ML IV SOLN
500.0000 [IU] | Freq: Once | INTRAVENOUS | Status: AC
  Administered 2023-01-28: 500 [IU] via INTRAVENOUS
  Filled 2023-01-28: qty 5

## 2023-01-28 NOTE — Assessment & Plan Note (Addendum)
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 AUG 2019- NED; fibroid uterus; endometrial thickening [see discussion below]. Colo- 2019- wnl [again in 2024];  stable CEA 6 months ago-normal. Awaiting CEA from today. stable  # Clinically no evidence of recurrence. Continue surveillance without imaging.   # Elevated BG 210 [post BF]- not diabetic. Discussed TU:[id number] changes.  Follow up with PCP/ in MAY 2024.   # DCIS status post lumpectomy- S/P Aromasin. Mammogram in NOV 2023--WNL.stable   # Slightly low GFR- recommend hydration; on Tylenol [Tooth pain/oral surgery]and follow up with PCP.   # IV Access- port palcement-port flushes very 3 months.   # DISPOSITION: # port flush in 3 months- # Follow-up with in  6 months-MD/port flush; CBC CMP CEA- Dr.B.

## 2023-01-28 NOTE — Progress Notes (Signed)
[facility] OFFICE PROGRESS NOTE  Patient Care Team: Jinny Sanders, MD as PCP - General Clent Jacks, RN as Oncology Nurse Navigator Cammie Sickle, MD as Consulting Physician (Oncology)   Cancer Staging  No matching staging information was found for the patient.   Oncology History Overview Note  # 2005- COLON CA [Stage II- left sided; s/p Surgery in [facility] [place]]; no Adj chemo.    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    # Nov 2010-LLE DVT [on Tam] on coumadin; AUG 2017- STOP coumadin  #August 2020-endometrial biopsy-Dr. Fransisca Connors negative for malignancy.  # ; PN-G-1; port flush q 6 w   # Left kidney cyst [Feb 2017]  # colonoscopy- 2019 [Dr.Mann; [place]]  DIAGNOSIS: METASTATIC COLON CA  STAGE: IV; NED        ;GOALS: control  CURRENT/MOST RECENT THERAPY: surveillance    History of colon cancer      INTERVAL HISTORY: Ambulating with a cane.  Alone.  Susan Duffy 77 y.o.  female pleasant patient above history of metastatic colon cancer currently on surveillance is here for follow-up.  Recent oral surgery- on Tylenol.  No worsening aches and pains.  No vaginal bleeding.  No nausea vomiting headache.  No blood in stools or black or stools.  Review of Systems  Constitutional:  Negative for chills, diaphoresis, fever, malaise/fatigue and weight loss.  HENT:  Negative for nosebleeds and sore throat.   Eyes:  Negative for double vision.  Respiratory:  Negative for cough, hemoptysis, sputum production, shortness of breath and wheezing.   Cardiovascular:  Negative for chest pain, palpitations, orthopnea and leg swelling.  Gastrointestinal:  Negative for abdominal pain, blood in stool,  constipation, diarrhea, heartburn, melena, nausea and vomiting.  Genitourinary:  Negative for dysuria, frequency and urgency.  Musculoskeletal:  Positive for back pain and joint pain.  Skin: Negative.  Negative for itching and rash.  Neurological:  Negative for dizziness, tingling, focal weakness, weakness and headaches.  Endo/Heme/Allergies:  Does not bruise/bleed easily.  Psychiatric/Behavioral:  Negative for depression. The patient is not nervous/anxious and does not have insomnia.       PAST MEDICAL HISTORY :  Past Medical History:  Diagnosis Date   Allergy    Breast CA ([place])    Breast cancer ([place]) 2010   in situ and took radiation, no chemo needed   Colon cancer ([place])    stage 4 colon cancer   History of chemotherapy 02/2011-08/2011   FOLFOX/AVASTIN- 8 CYCLES   History of DVT (deep vein thrombosis) 2010   while taking tamoxifen-left lower extremity   History of peripheral neuropathy    Hyperlipidemia    Hypertension    IDA (iron deficiency anemia)    Osteoarthritis    Personal history of chemotherapy    colon ca   Personal history of radiation therapy    breast and colon   TIA (transient ischemic attack) 03/08/2011   Vitamin D deficiency     PAST SURGICAL HISTORY :   Past Surgical History:  Procedure Laterality Date   BREAST EXCISIONAL BIOPSY Left 12/2008   s/p lumpectomy and radiation left dcis   BREAST EXCISIONAL BIOPSY Left    fibroadeoma reoved age 38   BREAST LUMPECTOMY  2010   left breast   broken ankle  Left 08/2012   COLON SURGERY     colectomy, partial   INTRACAPSULAR CATARACT EXTRACTION Bilateral    LOBECTOMY  06/2009   metastatic colon cancer    FAMILY HISTORY :   Family History  Problem Relation Age of Onset   Hypertension Mother    Stroke Mother    Coronary artery disease Mother    Thyroid disease Mother    Diabetes Father    Hypertension Father    Lung cancer Father    Heart attack Maternal Grandfather    Breast cancer Maternal  Grandmother    Thyroid disease Brother    Diabetes Paternal Grandmother    Colon cancer Paternal Grandfather    Deep vein thrombosis Other     SOCIAL HISTORY:   Social History   Tobacco Use   Smoking status: Never   Smokeless tobacco: Never  Vaping Use   Vaping Use: Never used  Substance Use Topics   Alcohol use: No   Drug use: No    ALLERGIES:  is allergic to amoxicillin, erythromycin, and nitrofuran derivatives.  MEDICATIONS:  Current Outpatient Medications  Medication Sig Dispense Refill   acetaminophen (TYLENOL) 500 MG tablet as needed for moderate pain. Take 1-2 by mouth every 6 hours as needed      aspirin 81 MG chewable tablet Chew 81 mg by mouth daily.     cholecalciferol (VITAMIN D) 400 UNITS TABS tablet Take 400 Units by mouth 2 (two) times daily.     Cyanocobalamin (VITAMIN B-12) 2000 MCG TBCR Take 1 tablet by mouth daily.     lidocaine-prilocaine (EMLA) cream Apply 1 application topically as needed. To port a cath site approximately 1 to 2 hours prior to port needle insertion. 30 g 12   losartan (COZAAR) 50 MG tablet TAKE 1 TABLET BY MOUTH EVERY DAY 90 tablet 1   simvastatin (ZOCOR) 40 MG tablet TAKE 1 TABLET BY MOUTH EVERYDAY AT BEDTIME 90 tablet 3   No current facility-administered medications for this visit.   Facility-Administered Medications Ordered in Other Visits  Medication Dose Route Frequency Provider Last Rate Last Admin   sodium chloride flush (NS) 0.9 % injection 10 mL  10 mL Intravenous PRN Cammie Sickle, MD   10 mL at 01/05/16 1135    PHYSICAL EXAMINATION: ECOG PERFORMANCE STATUS: 0 - Asymptomatic  BP 129/86 (BP Location: Left Arm, Patient Position: Sitting)   Pulse 83   Temp 97.8 F (36.6 C) (Tympanic)   Resp 16   Wt 172 lb 1.6 oz (78.1 kg)   SpO2 97%   BMI 31.48 kg/m   Filed Weights   01/28/23 1050  Weight: 172 lb 1.6 oz (78.1 kg)   Physical Exam Constitutional:      Comments: Obese.  Walks by herself.  She is alone.   HENT:     Head: Normocephalic and atraumatic.     Mouth/Throat:     Pharynx: No oropharyngeal exudate.  Eyes:     Pupils: Pupils are equal, round, and reactive to light.  Cardiovascular:     Rate and Rhythm: Normal rate and regular rhythm.  Pulmonary:     Effort: No respiratory distress.     Breath sounds: No wheezing.  Abdominal:     General: Bowel sounds are normal. There is no distension.     Palpations: Abdomen is soft. There is no mass.     Tenderness: There is no abdominal tenderness. There is no  guarding or rebound.  Musculoskeletal:        General: No tenderness. Normal range of motion.     Cervical back: Normal range of motion and neck supple.  Skin:    General: Skin is warm.  Neurological:     Mental Status: She is alert and oriented to person, place, and time.  Psychiatric:        Mood and Affect: Affect normal.     SKIN:  no rashes or significant lesions    LABORATORY DATA:  I have reviewed the data as listed    Component Value Date/Time   NA 137 01/28/2023 1036   NA 141 12/20/2013 0901   K 3.8 01/28/2023 1036   K 4.1 12/20/2013 0901   CL 103 01/28/2023 1036   CL 106 12/18/2012 0842   CO2 22 01/28/2023 1036   CO2 27 12/20/2013 0901   GLUCOSE 210 (H) 01/28/2023 1036   GLUCOSE 103 12/20/2013 0901   GLUCOSE 109 (H) 12/18/2012 0842   BUN 27 (H) 01/28/2023 1036   BUN 23.2 12/20/2013 0901   CREATININE 1.15 (H) 01/28/2023 1036   CREATININE 1.04 12/31/2014 1023   CREATININE 1.0 12/20/2013 0901   CALCIUM 8.8 (L) 01/28/2023 1036   CALCIUM 10.1 12/20/2013 0901   PROT 8.1 01/28/2023 1036   PROT 7.9 12/31/2014 1023   PROT 8.5 (H) 12/20/2013 0901   ALBUMIN 3.6 01/28/2023 1036   ALBUMIN 3.3 (L) 12/31/2014 1023   ALBUMIN 3.7 12/20/2013 0901   AST 27 01/28/2023 1036   AST 24 12/31/2014 1023   AST 21 12/20/2013 0901   ALT 22 01/28/2023 1036   ALT 28 12/31/2014 1023   ALT 18 12/20/2013 0901   ALKPHOS 94 01/28/2023 1036   ALKPHOS 135 (H) 12/31/2014 1023    ALKPHOS 144 12/20/2013 0901   BILITOT 0.8 01/28/2023 1036   BILITOT 0.4 12/31/2014 1023   BILITOT 0.60 12/20/2013 0901   GFRNONAA 49 (L) 01/28/2023 1036   GFRNONAA 56 (L) 12/31/2014 1023   GFRNONAA 57 (L) 06/24/2014 1300   GFRAA >60 07/30/2020 1009   GFRAA >60 12/31/2014 1023   GFRAA >60 06/24/2014 1300    No results found for: "SPEP", "UPEP"  Lab Results  Component Value Date   WBC 5.4 01/28/2023   NEUTROABS 3.7 01/28/2023   HGB 14.7 01/28/2023   HCT 44.0 01/28/2023   MCV 96.3 01/28/2023   PLT 263 01/28/2023      Chemistry      Component Value Date/Time   NA 137 01/28/2023 1036   NA 141 12/20/2013 0901   K 3.8 01/28/2023 1036   K 4.1 12/20/2013 0901   CL 103 01/28/2023 1036   CL 106 12/18/2012 0842   CO2 22 01/28/2023 1036   CO2 27 12/20/2013 0901   BUN 27 (H) 01/28/2023 1036   BUN 23.2 12/20/2013 0901   CREATININE 1.15 (H) 01/28/2023 1036   CREATININE 1.04 12/31/2014 1023   CREATININE 1.0 12/20/2013 0901      Component Value Date/Time   CALCIUM 8.8 (L) 01/28/2023 1036   CALCIUM 10.1 12/20/2013 0901   ALKPHOS 94 01/28/2023 1036   ALKPHOS 135 (H) 12/31/2014 1023   ALKPHOS 144 12/20/2013 0901   AST 27 01/28/2023 1036   AST 24 12/31/2014 1023   AST 21 12/20/2013 0901   ALT 22 01/28/2023 1036   ALT 28 12/31/2014 1023   ALT 18 12/20/2013 0901   BILITOT 0.8 01/28/2023 1036   BILITOT 0.4 12/31/2014 1023   BILITOT 0.60 12/20/2013  0901       RADIOGRAPHIC STUDIES: I have personally reviewed the radiological images as listed and agreed with the findings in the report. No results found.   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 AUG 2019- NED; fibroid uterus; endometrial thickening [see discussion below]. Colo- 2019- wnl [again in 2024];  stable CEA 6 months ago-normal. Awaiting CEA from today. stable  # Clinically no evidence of recurrence. Continue surveillance without  imaging.   # Elevated BG 210 [post BF]- not diabetic. Discussed [id number] changes.  Follow up with PCP/ in MAY 2024.   # DCIS status post lumpectomy- S/P Aromasin. Mammogram in NOV 2023--WNL.stable   # Slightly low GFR- recommend hydration; on Tylenol [Tooth pain/oral surgery]and follow up with PCP.   # IV Access- port palcement-port flushes very 3 months.   # DISPOSITION: # port flush in 3 months- # Follow-up with in  6 months-MD/port flush; CBC CMP CEA- Dr.B.    Orders Placed This Encounter  Procedures   CBC with Differential ([facility] Only)    Standing Status:   Future    Standing Expiration Date:   01/29/2024   CMP ([facility] only)    Standing Status:   Future    Standing Expiration Date:   01/29/2024   CEA    Standing Status:   Future    Standing Expiration Date:   01/29/2024    All questions were answered. The patient knows to call the clinic with any problems, questions or concerns.      Cammie Sickle, MD 01/28/2023 1:15 PM

## 2023-01-28 NOTE — Progress Notes (Signed)
Pt doing well. Not having any problems from her Hx of colon cancer.

## 2023-01-29 LAB — CEA: CEA: 1.9 ng/mL (ref 0.0–4.7)

## 2023-02-14 ENCOUNTER — Other Ambulatory Visit: Payer: Self-pay | Admitting: Family Medicine

## 2023-03-15 ENCOUNTER — Telehealth: Payer: Self-pay | Admitting: *Deleted

## 2023-03-15 DIAGNOSIS — E559 Vitamin D deficiency, unspecified: Secondary | ICD-10-CM

## 2023-03-15 DIAGNOSIS — E1169 Type 2 diabetes mellitus with other specified complication: Secondary | ICD-10-CM

## 2023-03-15 DIAGNOSIS — E1159 Type 2 diabetes mellitus with other circulatory complications: Secondary | ICD-10-CM

## 2023-03-15 NOTE — Telephone Encounter (Signed)
-----   Message from Alvina Chou sent at 03/15/2023  3:52 PM EDT ----- Regarding: Lab orders for Thursday, 5.2.24 Patient is scheduled for CPX labs, please order future labs, Thanks , Camelia Eng

## 2023-03-17 NOTE — Addendum Note (Signed)
Addended by: [name] E on: 03/17/2023 05:24 PM   Modules accepted: Orders

## 2023-04-07 ENCOUNTER — Other Ambulatory Visit (INDEPENDENT_AMBULATORY_CARE_PROVIDER_SITE_OTHER): Payer: Medicare Other

## 2023-04-07 DIAGNOSIS — E785 Hyperlipidemia, unspecified: Secondary | ICD-10-CM | POA: Diagnosis not present

## 2023-04-07 DIAGNOSIS — E1159 Type 2 diabetes mellitus with other circulatory complications: Secondary | ICD-10-CM

## 2023-04-07 DIAGNOSIS — E559 Vitamin D deficiency, unspecified: Secondary | ICD-10-CM | POA: Diagnosis not present

## 2023-04-07 DIAGNOSIS — E1169 Type 2 diabetes mellitus with other specified complication: Secondary | ICD-10-CM

## 2023-04-07 LAB — LIPID PANEL
Cholesterol: 122 mg/dL (ref 0–200)
HDL: 44.6 mg/dL (ref >39.00)
LDL Cholesterol: 56 mg/dL (ref 0–99)
NonHDL: 77.72
Total CHOL/HDL Ratio: 3
Triglycerides: 110 mg/dL (ref 0.0–149.0)
VLDL: 22 mg/dL (ref 0.0–40.0)

## 2023-04-07 LAB — COMPREHENSIVE METABOLIC PANEL
ALT: 11 U/L (ref 0–35)
AST: 20 U/L (ref 0–37)
Albumin: 3.8 g/dL (ref 3.5–5.2)
Alkaline Phosphatase: 85 U/L (ref 39–117)
BUN: 17 mg/dL (ref 6–23)
CO2: 26 mEq/L (ref 19–32)
Calcium: 9.1 mg/dL (ref 8.4–10.5)
Chloride: 105 mEq/L (ref 96–112)
Creatinine, Ser: 0.88 mg/dL (ref 0.40–1.20)
GFR: 62.7 mL/min (ref >60.00)
Glucose, Bld: 118 mg/dL — ABNORMAL HIGH (ref 70–99)
Potassium: 4.1 mEq/L (ref 3.5–5.1)
Sodium: 140 mEq/L (ref 135–145)
Total Bilirubin: 0.8 mg/dL (ref 0.2–1.2)
Total Protein: 7.6 g/dL (ref 6.0–8.3)

## 2023-04-07 LAB — MICROALBUMIN / CREATININE URINE RATIO
Creatinine,U: 242.6 mg/dL
Microalb Creat Ratio: 6 mg/g (ref 0.0–30.0)
Microalb, Ur: 14.6 mg/dL — ABNORMAL HIGH (ref 0.0–1.9)

## 2023-04-07 LAB — HEMOGLOBIN A1C: Hgb A1c MFr Bld: 5.8 % (ref 4.6–6.5)

## 2023-04-07 LAB — VITAMIN D 25 HYDROXY (VIT D DEFICIENCY, FRACTURES): VITD: 42.44 ng/mL (ref 30.00–100.00)

## 2023-04-07 NOTE — Progress Notes (Signed)
No critical labs need to be addressed urgently. We will discuss labs in detail at upcoming office visit.   

## 2023-04-08 ENCOUNTER — Other Ambulatory Visit: Payer: Self-pay | Admitting: Family Medicine

## 2023-04-14 ENCOUNTER — Ambulatory Visit (INDEPENDENT_AMBULATORY_CARE_PROVIDER_SITE_OTHER): Payer: Medicare Other | Admitting: Family Medicine

## 2023-04-14 ENCOUNTER — Encounter: Payer: Self-pay | Admitting: Family Medicine

## 2023-04-14 VITALS — BP 152/72 | HR 65 | Temp 97.0°F | Ht 62.0 in | Wt 171.0 lb

## 2023-04-14 DIAGNOSIS — E785 Hyperlipidemia, unspecified: Secondary | ICD-10-CM

## 2023-04-14 DIAGNOSIS — E118 Type 2 diabetes mellitus with unspecified complications: Secondary | ICD-10-CM

## 2023-04-14 DIAGNOSIS — Z0000 Encounter for general adult medical examination without abnormal findings: Secondary | ICD-10-CM

## 2023-04-14 DIAGNOSIS — I152 Hypertension secondary to endocrine disorders: Secondary | ICD-10-CM | POA: Diagnosis not present

## 2023-04-14 DIAGNOSIS — E1169 Type 2 diabetes mellitus with other specified complication: Secondary | ICD-10-CM | POA: Diagnosis not present

## 2023-04-14 DIAGNOSIS — E1159 Type 2 diabetes mellitus with other circulatory complications: Secondary | ICD-10-CM

## 2023-04-14 DIAGNOSIS — E559 Vitamin D deficiency, unspecified: Secondary | ICD-10-CM

## 2023-04-14 LAB — HM DIABETES FOOT EXAM

## 2023-04-14 NOTE — Assessment & Plan Note (Signed)
Stable, chronic.  Continue current medication.  Losartan 50 mg p.o. daily 

## 2023-04-14 NOTE — Assessment & Plan Note (Signed)
Stable, chronic.  Continue current medication.  Simvastatin 40 mg p.o. daily  

## 2023-04-14 NOTE — Progress Notes (Signed)
Patient ID: Susan Duffy, female    DOB: 01-30-1946, 77 y.o.   MRN: [id number]  This visit was conducted in person.  Blood pressure 136/80, pulse 79, height 5\' 2"  (1.575 m), weight 172 lb 14.4 oz (78.4 kg), SpO2 96 %.'  CC: .cc Subjective:   HPI: Susan Duffy is a 77 y.o. female presenting on 04/14/2023 for  annual review of chronic health problems.  The patient presents for annual medicare wellness, complete physical and review of chronic health problems. He/She also has the following acute concerns today:  She has noted very mild increase in SOB with vacuuming, very gradaul.  She still walks daily 30 min., No CP.  No issues at rest.  I have personally reviewed the Medicare Annual Wellness questionnaire and have noted 1. The patient's medical and social history 2. Their use of alcohol, tobacco or illicit drugs 3. Their current medications and supplements 4. The patient's functional ability including ADL's, fall risks, home safety risks and hearing or visual             impairment. 5. Diet and physical activities 6. Evidence for depression or mood disorders 7.         Updated provider list Cognitive evaluation was performed and recorded on pt medicare questionnaire form. The patients weight, height, BMI and visual acuity have been recorded in the chart   I have made referrals, counseling and provided education to the patient based review of the above and I have provided the pt with a written personalized care plan for preventive services.   Documentation of this information was scanned into the electronic record under the media tab.  No falls in last 12 months.  Flowsheet Row Office Visit from 04/14/2023 in [facility] [place]  PHQ-2 Total Score 0       Screening hearing and vision documented in Epic database   Diabetes: Diet controlled Lab Results  Component Value Date   HGBA1C 5.8 04/07/2023  Using medications without  difficulties: Hypoglycemic episodes: Hyperglycemic episodes: Feet problems: Blood Sugars averaging: eye exam within last year:  Elevated Cholesterol: LDL AT goal < 70 given past TIA on simvastatin 40 mg daily Lab Results  Component Value Date   CHOL 122 04/07/2023   HDL 44.60 04/07/2023   LDLCALC 56 04/07/2023   LDLDIRECT 74.0 11/14/2018   TRIG 110.0 04/07/2023   CHOLHDL 3 04/07/2023  Using medications without problems: Muscle aches:  Diet compliance: heart healthy diet. Exercise: walking daily Other complaints:  Hypertension:  Borderline control of blood pressure today on losartan 50 mg p.o. daily Using medication without problems or lightheadedness:  none Chest pain with exertion: none Edema: none Short of breath: see above Average home BPs: Other issues:   BP Readings from Last 3 Encounters:  04/14/23 (!) 152/72  01/28/23 129/86  10/14/22 120/70     Vitamin D deficiency: Resolved with supplementation   Relevant past medical, surgical, family and social history reviewed and updated as indicated. Interim medical history since our last visit reviewed. Allergies and medications reviewed and updated. Outpatient Medications Prior to Visit  Medication Sig Dispense Refill   acetaminophen (TYLENOL) 500 MG tablet as needed for moderate pain. Take 1-2 by mouth every 6 hours as needed      aspirin 81 MG chewable tablet Chew 81 mg by mouth daily.     cholecalciferol (VITAMIN D) 400 UNITS TABS tablet Take 400 Units by mouth 2 (two) times daily.  Cyanocobalamin (VITAMIN B-12) 2000 MCG TBCR Take 1 tablet by mouth daily.     lidocaine-prilocaine (EMLA) cream Apply 1 application topically as needed. To port a cath site approximately 1 to 2 hours prior to port needle insertion. 30 g 12   losartan (COZAAR) 50 MG tablet TAKE 1 TABLET BY MOUTH EVERY DAY 90 tablet 1   simvastatin (ZOCOR) 40 MG tablet TAKE 1 TABLET BY MOUTH EVERYDAY AT BEDTIME 90 tablet 0   Facility-Administered  Medications Prior to Visit  Medication Dose Route Frequency Provider Last Rate Last Admin   sodium chloride flush (NS) 0.9 % injection 10 mL  10 mL Intravenous PRN Earna Coder, MD   10 mL at 01/05/16 1135     Per HPI unless specifically indicated in ROS section below Review of Systems  Constitutional:  Negative for fatigue and fever.  HENT:  Negative for congestion.   Eyes:  Negative for pain.  Respiratory:  Negative for cough and shortness of breath.   Cardiovascular:  Negative for chest pain, palpitations and leg swelling.  Gastrointestinal:  Negative for abdominal pain.  Genitourinary:  Negative for dysuria and vaginal bleeding.  Musculoskeletal:  Negative for back pain.  Neurological:  Negative for syncope, light-headedness and headaches.  Psychiatric/Behavioral:  Negative for dysphoric mood.    Objective:  BP (!) 152/72   Pulse 65   Temp (!) 97 F (36.1 C) (Temporal)   Ht 5\' 2"  (1.575 m)   Wt 171 lb (77.6 kg)   SpO2 96%   BMI 31.28 kg/m   Wt Readings from Last 3 Encounters:  04/14/23 171 lb (77.6 kg)  01/28/23 172 lb 1.6 oz (78.1 kg)  10/14/22 176 lb 4 oz (79.9 kg)      Physical Exam Constitutional:      General: She is not in acute distress.    Appearance: Normal appearance. She is well-developed. She is not ill-appearing or toxic-appearing.  HENT:     Head: Normocephalic.     Right Ear: Hearing, tympanic membrane, ear canal and external ear normal. Tympanic membrane is not erythematous, retracted or bulging.     Left Ear: Hearing, tympanic membrane, ear canal and external ear normal. Tympanic membrane is not erythematous, retracted or bulging.     Nose: No mucosal edema or rhinorrhea.     Right Sinus: No maxillary sinus tenderness or frontal sinus tenderness.     Left Sinus: No maxillary sinus tenderness or frontal sinus tenderness.     Mouth/Throat:     Pharynx: Uvula midline.  Eyes:     General: Lids are normal. Lids are everted, no foreign bodies  appreciated.     Conjunctiva/sclera: Conjunctivae normal.     Pupils: Pupils are equal, round, and reactive to light.  Neck:     Thyroid: No thyroid mass or thyromegaly.     Vascular: No carotid bruit.     Trachea: Trachea normal.  Cardiovascular:     Rate and Rhythm: Normal rate and regular rhythm.     Pulses: Normal pulses.     Heart sounds: Normal heart sounds, S1 normal and S2 normal. No murmur heard.    No friction rub. No gallop.  Pulmonary:     Effort: Pulmonary effort is normal. No tachypnea or respiratory distress.     Breath sounds: Normal breath sounds. No decreased breath sounds, wheezing, rhonchi or rales.  Abdominal:     General: Bowel sounds are normal.     Palpations: Abdomen is soft.  Tenderness: There is no abdominal tenderness.  Musculoskeletal:     Cervical back: Normal range of motion and neck supple.  Skin:    General: Skin is warm and dry.     Findings: No rash.  Neurological:     Mental Status: She is alert.  Psychiatric:        Mood and Affect: Mood is not anxious or depressed.        Speech: Speech normal.        Behavior: Behavior normal. Behavior is cooperative.        Thought Content: Thought content normal.        Judgment: Judgment normal.     Diabetic foot exam: Normal inspection No skin breakdown  Bilateral pre-ulcerative calluses  Normal DP pulses Normal sensation to light touch and monofilament Nails normal     Results for orders placed or performed in visit on 04/14/23  HM DIABETES FOOT EXAM  Result Value Ref Range   HM Diabetic Foot Exam done      COVID 19 screen:  No recent travel or known exposure to COVID19 The patient denies respiratory symptoms of COVID 19 at this time. The importance of social distancing was discussed today.   Assessment and Plan Body mass index is 31.28 kg/m.   The patient's preventative maintenance and recommended screening tests for an annual wellness exam were reviewed in full today. Brought  up to date unless services declined.  Counselled on the importance of diet, exercise, and its role in overall health and mortality. The patient's FH and SH was reviewed, including their home life, tobacco status, and drug and alcohol status.   Vaccines:   Uptodate except, considering shingles/Td . Consider COVID booster.. Mammo: Hx of breast cancer. Stable 10/2022 Bone density: 10/2022 stable osteopenia, re-eval in 2-5 years.  So Colon: history of stage 4 colon cancer... Per GI Dr. Loreta Ave, colonoscopy 03/2018    CEA neg. PAP/DVE: DVE, pap not indicated, no symptoms. No vaginal bleeding Onc following thickening in uterus 07/2019... endometrial biopy.. negative. Hep C completed  Nonsmoker.  Reviewed advanced directives.  Problem List Items Addressed This Visit     Controlled diabetes mellitus type 2 with complications (HCC)    Associated with HTN and past TIA Well controlled with diet.      Hyperlipidemia associated with type 2 diabetes mellitus (HCC) (Chronic)    Stable, chronic.  Continue current medication.  Simvastatin 40 mg p.o. daily       Hypertension associated with diabetes (HCC) (Chronic)    Stable, chronic.  Continue current medication.  Losartan 50 mg p.o. daily      Vitamin D deficiency (Chronic)    Chronic, resolved on supplement        Other Visit Diagnoses     Medicare annual wellness visit, subsequent    -  Primary   Type 2 diabetes mellitus with other circulatory complication, without long-term current use of insulin (HCC)          Orders Placed This Encounter  Procedures   HM DIABETES FOOT EXAM    This external order was created through the Results Console.    [name], MD

## 2023-04-14 NOTE — Assessment & Plan Note (Signed)
Associated with HTN and past TIA Well controlled with diet. 

## 2023-04-14 NOTE — Assessment & Plan Note (Addendum)
Chronic, resolved on supplement ?

## 2023-04-28 ENCOUNTER — Other Ambulatory Visit: Payer: Self-pay | Admitting: *Deleted

## 2023-04-28 ENCOUNTER — Inpatient Hospital Stay: Payer: Medicare Other | Attending: Internal Medicine

## 2023-04-28 DIAGNOSIS — Z85038 Personal history of other malignant neoplasm of large intestine: Secondary | ICD-10-CM | POA: Diagnosis not present

## 2023-04-28 DIAGNOSIS — Z95828 Presence of other vascular implants and grafts: Secondary | ICD-10-CM

## 2023-04-28 DIAGNOSIS — Z86000 Personal history of in-situ neoplasm of breast: Secondary | ICD-10-CM | POA: Insufficient documentation

## 2023-04-28 LAB — CMP (CANCER CENTER ONLY)
ALT: 12 U/L (ref 0–44)
AST: 21 U/L (ref 15–41)
Albumin: 3.8 g/dL (ref 3.5–5.0)
Alkaline Phosphatase: 87 U/L (ref 38–126)
Anion gap: 10 (ref 5–15)
BUN: 18 mg/dL (ref 8–23)
CO2: 23 mmol/L (ref 22–32)
Calcium: 8.7 mg/dL — ABNORMAL LOW (ref 8.9–10.3)
Chloride: 105 mmol/L (ref 98–111)
Creatinine: 0.86 mg/dL (ref 0.44–1.00)
GFR, Estimated: >60 mL/min (ref >60)
Glucose, Bld: 114 mg/dL — ABNORMAL HIGH (ref 70–99)
Potassium: 3.9 mmol/L (ref 3.5–5.1)
Sodium: 138 mmol/L (ref 135–145)
Total Bilirubin: 0.9 mg/dL (ref 0.3–1.2)
Total Protein: 7.7 g/dL (ref 6.5–8.1)

## 2023-04-28 LAB — CBC WITH DIFFERENTIAL (CANCER CENTER ONLY)
Abs Immature Granulocytes: 0.02 10*3/uL (ref 0.00–0.07)
Basophils Absolute: 0.1 10*3/uL (ref 0.0–0.1)
Basophils Relative: 1 %
Eosinophils Absolute: 0.2 10*3/uL (ref 0.0–0.5)
Eosinophils Relative: 3 %
HCT: 41.7 % (ref 36.0–46.0)
Hemoglobin: 13.8 g/dL (ref 12.0–15.0)
Immature Granulocytes: 0 %
Lymphocytes Relative: 20 %
Lymphs Abs: 1.5 10*3/uL (ref 0.7–4.0)
MCH: 32.2 pg (ref 26.0–34.0)
MCHC: 33.1 g/dL (ref 30.0–36.0)
MCV: 97.4 fL (ref 80.0–100.0)
Monocytes Absolute: 0.6 10*3/uL (ref 0.1–1.0)
Monocytes Relative: 9 %
Neutro Abs: 4.7 10*3/uL (ref 1.7–7.7)
Neutrophils Relative %: 67 %
Platelet Count: 256 10*3/uL (ref 150–400)
RBC: 4.28 MIL/uL (ref 3.87–5.11)
RDW: 12.3 % (ref 11.5–15.5)
WBC Count: 7.1 10*3/uL (ref 4.0–10.5)
nRBC: 0 % (ref 0.0–0.2)

## 2023-04-28 MED ORDER — SODIUM CHLORIDE 0.9% FLUSH
10.0000 mL | Freq: Once | INTRAVENOUS | Status: AC
  Administered 2023-04-28: 10 mL via INTRAVENOUS
  Filled 2023-04-28: qty 10

## 2023-04-28 MED ORDER — HEPARIN SOD (PORK) LOCK FLUSH 100 UNIT/ML IV SOLN
500.0000 [IU] | Freq: Once | INTRAVENOUS | Status: AC
  Administered 2023-04-28: 500 [IU] via INTRAVENOUS
  Filled 2023-04-28: qty 5

## 2023-04-29 LAB — CEA: CEA: 1.8 ng/mL (ref 0.0–4.7)

## 2023-07-04 ENCOUNTER — Other Ambulatory Visit: Payer: Self-pay | Admitting: Family Medicine

## 2023-07-29 ENCOUNTER — Inpatient Hospital Stay: Payer: Medicare Other | Admitting: Internal Medicine

## 2023-07-29 ENCOUNTER — Encounter: Payer: Self-pay | Admitting: Internal Medicine

## 2023-07-29 ENCOUNTER — Inpatient Hospital Stay: Payer: Medicare Other | Attending: Internal Medicine

## 2023-07-29 VITALS — BP 144/90 | HR 84 | Temp 96.9°F | Ht 62.0 in | Wt 165.8 lb

## 2023-07-29 DIAGNOSIS — Z9221 Personal history of antineoplastic chemotherapy: Secondary | ICD-10-CM | POA: Diagnosis not present

## 2023-07-29 DIAGNOSIS — Z803 Family history of malignant neoplasm of breast: Secondary | ICD-10-CM | POA: Diagnosis not present

## 2023-07-29 DIAGNOSIS — Z923 Personal history of irradiation: Secondary | ICD-10-CM | POA: Diagnosis not present

## 2023-07-29 DIAGNOSIS — Z86718 Personal history of other venous thrombosis and embolism: Secondary | ICD-10-CM | POA: Diagnosis not present

## 2023-07-29 DIAGNOSIS — Z85038 Personal history of other malignant neoplasm of large intestine: Secondary | ICD-10-CM | POA: Diagnosis not present

## 2023-07-29 DIAGNOSIS — Z86000 Personal history of in-situ neoplasm of breast: Secondary | ICD-10-CM | POA: Diagnosis not present

## 2023-07-29 DIAGNOSIS — Z801 Family history of malignant neoplasm of trachea, bronchus and lung: Secondary | ICD-10-CM | POA: Diagnosis not present

## 2023-07-29 DIAGNOSIS — Z95828 Presence of other vascular implants and grafts: Secondary | ICD-10-CM

## 2023-07-29 DIAGNOSIS — Z7901 Long term (current) use of anticoagulants: Secondary | ICD-10-CM | POA: Insufficient documentation

## 2023-07-29 DIAGNOSIS — Z800 Family history of malignant neoplasm of digestive organs: Secondary | ICD-10-CM | POA: Insufficient documentation

## 2023-07-29 DIAGNOSIS — R739 Hyperglycemia, unspecified: Secondary | ICD-10-CM | POA: Insufficient documentation

## 2023-07-29 DIAGNOSIS — K0889 Other specified disorders of teeth and supporting structures: Secondary | ICD-10-CM | POA: Diagnosis not present

## 2023-07-29 DIAGNOSIS — Z832 Family history of diseases of the blood and blood-forming organs and certain disorders involving the immune mechanism: Secondary | ICD-10-CM | POA: Insufficient documentation

## 2023-07-29 DIAGNOSIS — Z79811 Long term (current) use of aromatase inhibitors: Secondary | ICD-10-CM | POA: Insufficient documentation

## 2023-07-29 LAB — CBC WITH DIFFERENTIAL (CANCER CENTER ONLY)
Abs Immature Granulocytes: 0.03 10*3/uL (ref 0.00–0.07)
Basophils Absolute: 0.1 10*3/uL (ref 0.0–0.1)
Basophils Relative: 1 %
Eosinophils Absolute: 0.2 10*3/uL (ref 0.0–0.5)
Eosinophils Relative: 2 %
HCT: 43.7 % (ref 36.0–46.0)
Hemoglobin: 14.3 g/dL (ref 12.0–15.0)
Immature Granulocytes: 0 %
Lymphocytes Relative: 18 %
Lymphs Abs: 1.5 10*3/uL (ref 0.7–4.0)
MCH: 31.6 pg (ref 26.0–34.0)
MCHC: 32.7 g/dL (ref 30.0–36.0)
MCV: 96.7 fL (ref 80.0–100.0)
Monocytes Absolute: 0.8 10*3/uL (ref 0.1–1.0)
Monocytes Relative: 9 %
Neutro Abs: 5.8 10*3/uL (ref 1.7–7.7)
Neutrophils Relative %: 70 %
Platelet Count: 275 10*3/uL (ref 150–400)
RBC: 4.52 MIL/uL (ref 3.87–5.11)
RDW: 12.4 % (ref 11.5–15.5)
WBC Count: 8.3 10*3/uL (ref 4.0–10.5)
nRBC: 0 % (ref 0.0–0.2)

## 2023-07-29 LAB — CMP (CANCER CENTER ONLY)
ALT: 13 U/L (ref 0–44)
AST: 21 U/L (ref 15–41)
Albumin: 3.8 g/dL (ref 3.5–5.0)
Alkaline Phosphatase: 92 U/L (ref 38–126)
Anion gap: 5 (ref 5–15)
BUN: 21 mg/dL (ref 8–23)
CO2: 24 mmol/L (ref 22–32)
Calcium: 8.8 mg/dL — ABNORMAL LOW (ref 8.9–10.3)
Chloride: 109 mmol/L (ref 98–111)
Creatinine: 0.86 mg/dL (ref 0.44–1.00)
GFR, Estimated: >60 mL/min (ref >60)
Glucose, Bld: 122 mg/dL — ABNORMAL HIGH (ref 70–99)
Potassium: 3.7 mmol/L (ref 3.5–5.1)
Sodium: 138 mmol/L (ref 135–145)
Total Bilirubin: 0.5 mg/dL (ref 0.3–1.2)
Total Protein: 7.7 g/dL (ref 6.5–8.1)

## 2023-07-29 MED ORDER — HEPARIN SOD (PORK) LOCK FLUSH 100 UNIT/ML IV SOLN
500.0000 [IU] | Freq: Once | INTRAVENOUS | Status: AC
  Administered 2023-07-29: 500 [IU] via INTRAVENOUS
  Filled 2023-07-29: qty 5

## 2023-07-29 MED ORDER — SODIUM CHLORIDE 0.9% FLUSH
10.0000 mL | Freq: Once | INTRAVENOUS | Status: AC
  Administered 2023-07-29: 10 mL via INTRAVENOUS
  Filled 2023-07-29: qty 10

## 2023-07-29 NOTE — Assessment & Plan Note (Signed)
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 AUG 2019- NED; fibroid uterus; endometrial thickening [see discussion below]. Colo- 2019- wnl [again in 2024];  stable CEA 6 months ago-normal. Awaiting CEA from today. stable  # Clinically no evidence of recurrence. Continue surveillance without imaging.   # Elevated BG 114 [post BF]- not diabetic. Discussed AT:FTDDUKG changes. Stable.   # DCIS status post lumpectomy- S/P Aromasin. Mammogram in NOV 2023--WNL Stable.   # Slightly low GFR- recommend hydration; on Tylenol [Tooth pain/oral surgery]and follow up with PCP-  Stable. .   # IV Access- port palcement-port flushes very 3 months.   # DISPOSITION: # port flush in 3 months- # Follow-up with in  6 months-MD/port flush; CBC CMP CEA- Dr.B.

## 2023-07-29 NOTE — Progress Notes (Signed)
C/o left ankle pain, using an ankle brace. Not sure what she may have done. Feels a little better today.

## 2023-07-29 NOTE — Progress Notes (Signed)
[facility] OFFICE PROGRESS NOTE  Patient Care Team: Excell Seltzer, MD as PCP - General Benita Gutter, RN as Oncology Nurse Navigator Earna Coder, MD as Consulting Physician (Oncology)   Cancer Staging  No matching staging information was found for the patient.    Oncology History Overview Note  # 2005- COLON CA [Stage II- left sided; s/p Surgery in [facility] [place]]; no Adj chemo.    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    # Nov 2010-LLE DVT [on Tam] on coumadin; AUG 2017- STOP coumadin  #August 2020-endometrial biopsy-Dr. Johnnette Litter negative for malignancy.  # ; PN-G-1; port flush q 6 w   # Left kidney cyst [Feb 2017]  # colonoscopy- 2019 [Dr.Mann; [place]]  DIAGNOSIS: METASTATIC COLON CA  STAGE: IV; NED        ;GOALS: control  CURRENT/MOST RECENT THERAPY: surveillance    History of colon cancer    INTERVAL HISTORY: Ambulating with a cane.  Alone.  Susan Duffy 77 y.o.  female pleasant patient above history of metastatic colon cancer- stage IV-NED currently on surveillance is here for follow-up.  Patient c/o left ankle pain, using an ankle brace. Not sure what she may have done. Feels a little better today   Other No worsening aches and pains.  No vaginal bleeding.  No nausea vomiting headache.  No blood in stools or black or stools.  Review of Systems  Constitutional:  Negative for chills, diaphoresis, fever, malaise/fatigue and weight loss.  HENT:  Negative for nosebleeds and sore throat.   Eyes:  Negative for double vision.  Respiratory:  Negative for cough, hemoptysis, sputum production, shortness of breath and wheezing.   Cardiovascular:  Negative for chest pain, palpitations,  orthopnea and leg swelling.  Gastrointestinal:  Negative for abdominal pain, blood in stool, constipation, diarrhea, heartburn, melena, nausea and vomiting.  Genitourinary:  Negative for dysuria, frequency and urgency.  Musculoskeletal:  Positive for back pain and joint pain.  Skin: Negative.  Negative for itching and rash.  Neurological:  Negative for dizziness, tingling, focal weakness, weakness and headaches.  Endo/Heme/Allergies:  Does not bruise/bleed easily.  Psychiatric/Behavioral:  Negative for depression. The patient is not nervous/anxious and does not have insomnia.       PAST MEDICAL HISTORY :  Past Medical History:  Diagnosis Date   Allergy    Breast CA (HCC)    Breast cancer (HCC) 2010   in situ and took radiation, no chemo needed   Colon cancer (HCC)    stage 4 colon cancer   History of chemotherapy 02/2011-08/2011   FOLFOX/AVASTIN- 8 CYCLES   History of DVT (deep vein thrombosis) 2010   while taking tamoxifen-left lower extremity   History of peripheral neuropathy    Hyperlipidemia    Hypertension    IDA (iron deficiency anemia)    Osteoarthritis    Personal history of chemotherapy    colon ca   Personal history of radiation therapy    breast and colon   TIA (transient ischemic attack) 03/08/2011   Vitamin D deficiency     PAST SURGICAL HISTORY :   Past Surgical History:  Procedure Laterality Date   BREAST EXCISIONAL BIOPSY Left 12/2008   s/p lumpectomy and radiation  left dcis   BREAST EXCISIONAL BIOPSY Left    fibroadeoma reoved age 19   BREAST LUMPECTOMY  2010   left breast   broken ankle  Left 08/2012   COLON SURGERY     colectomy, partial   INTRACAPSULAR CATARACT EXTRACTION Bilateral    LOBECTOMY  06/2009   metastatic colon cancer    FAMILY HISTORY :   Family History  Problem Relation Age of Onset   Hypertension Mother    Stroke Mother    Coronary artery disease Mother    Thyroid disease Mother    Diabetes Father    Hypertension Father     Lung cancer Father    Heart attack Maternal Grandfather    Breast cancer Maternal Grandmother    Thyroid disease Brother    Diabetes Paternal Grandmother    Colon cancer Paternal Grandfather    Deep vein thrombosis Other     SOCIAL HISTORY:   Social History   Tobacco Use   Smoking status: Never   Smokeless tobacco: Never  Vaping Use   Vaping status: Never Used  Substance Use Topics   Alcohol use: No   Drug use: No    ALLERGIES:  is allergic to amoxicillin, erythromycin, and nitrofuran derivatives.  MEDICATIONS:  Current Outpatient Medications  Medication Sig Dispense Refill   acetaminophen (TYLENOL) 500 MG tablet as needed for moderate pain. Take 1-2 by mouth every 6 hours as needed      aspirin 81 MG chewable tablet Chew 81 mg by mouth daily.     cholecalciferol (VITAMIN D) 400 UNITS TABS tablet Take 400 Units by mouth 2 (two) times daily.     Cyanocobalamin (VITAMIN B-12) 2000 MCG TBCR Take 1 tablet by mouth daily.     lidocaine-prilocaine (EMLA) cream Apply 1 application topically as needed. To port a cath site approximately 1 to 2 hours prior to port needle insertion. 30 g 12   losartan (COZAAR) 50 MG tablet TAKE 1 TABLET BY MOUTH EVERY DAY 90 tablet 1   simvastatin (ZOCOR) 40 MG tablet TAKE 1 TABLET BY MOUTH EVERYDAY AT BEDTIME 90 tablet 3   No current facility-administered medications for this visit.   Facility-Administered Medications Ordered in Other Visits  Medication Dose Route Frequency Provider Last Rate Last Admin   sodium chloride flush (NS) 0.9 % injection 10 mL  10 mL Intravenous PRN Earna Coder, MD   10 mL at 01/05/16 1135    PHYSICAL EXAMINATION: ECOG PERFORMANCE STATUS: 0 - Asymptomatic  BP (!) 144/90 (BP Location: Right Arm, Patient Position: Sitting, Cuff Size: Normal)   Pulse 84   Temp (!) 96.9 F (36.1 C) (Tympanic)   Ht 5\' 2"  (1.575 m)   Wt 165 lb 12.8 oz (75.2 kg)   SpO2 93%   BMI 30.33 kg/m   Filed Weights   07/29/23 1018   Weight: 165 lb 12.8 oz (75.2 kg)   Physical Exam Constitutional:      Comments: Obese.  Walks by herself.  She is alone.  HENT:     Head: Normocephalic and atraumatic.     Mouth/Throat:     Pharynx: No oropharyngeal exudate.  Eyes:     Pupils: Pupils are equal, round, and reactive to light.  Cardiovascular:     Rate and Rhythm: Normal rate and regular rhythm.  Pulmonary:     Effort: No respiratory distress.     Breath sounds: No wheezing.  Abdominal:     General: Bowel sounds are normal. There  is no distension.     Palpations: Abdomen is soft. There is no mass.     Tenderness: There is no abdominal tenderness. There is no guarding or rebound.  Musculoskeletal:        General: No tenderness. Normal range of motion.     Cervical back: Normal range of motion and neck supple.  Skin:    General: Skin is warm.  Neurological:     Mental Status: She is alert and oriented to person, place, and time.  Psychiatric:        Mood and Affect: Affect normal.     SKIN:  no rashes or significant lesions    LABORATORY DATA:  I have reviewed the data as listed    Component Value Date/Time   NA 138 07/29/2023 1025   NA 141 12/20/2013 0901   K 3.7 07/29/2023 1025   K 4.1 12/20/2013 0901   CL 109 07/29/2023 1025   CL 106 12/18/2012 0842   CO2 24 07/29/2023 1025   CO2 27 12/20/2013 0901   GLUCOSE 122 (H) 07/29/2023 1025   GLUCOSE 103 12/20/2013 0901   GLUCOSE 109 (H) 12/18/2012 0842   BUN 21 07/29/2023 1025   BUN 23.2 12/20/2013 0901   CREATININE 0.86 07/29/2023 1025   CREATININE 1.04 12/31/2014 1023   CREATININE 1.0 12/20/2013 0901   CALCIUM 8.8 (L) 07/29/2023 1025   CALCIUM 10.1 12/20/2013 0901   PROT 7.7 07/29/2023 1025   PROT 7.9 12/31/2014 1023   PROT 8.5 (H) 12/20/2013 0901   ALBUMIN 3.8 07/29/2023 1025   ALBUMIN 3.3 (L) 12/31/2014 1023   ALBUMIN 3.7 12/20/2013 0901   AST 21 07/29/2023 1025   AST 21 12/20/2013 0901   ALT 13 07/29/2023 1025   ALT 28 12/31/2014 1023    ALT 18 12/20/2013 0901   ALKPHOS 92 07/29/2023 1025   ALKPHOS 135 (H) 12/31/2014 1023   ALKPHOS 144 12/20/2013 0901   BILITOT 0.5 07/29/2023 1025   BILITOT 0.60 12/20/2013 0901   GFRNONAA >60 07/29/2023 1025   GFRNONAA 56 (L) 12/31/2014 1023   GFRNONAA 57 (L) 06/24/2014 1300   GFRAA >60 07/30/2020 1009   GFRAA >60 12/31/2014 1023   GFRAA >60 06/24/2014 1300    No results found for: "SPEP", "UPEP"  Lab Results  Component Value Date   WBC 8.3 07/29/2023   NEUTROABS 5.8 07/29/2023   HGB 14.3 07/29/2023   HCT 43.7 07/29/2023   MCV 96.7 07/29/2023   PLT 275 07/29/2023      Chemistry      Component Value Date/Time   NA 138 07/29/2023 1025   NA 141 12/20/2013 0901   K 3.7 07/29/2023 1025   K 4.1 12/20/2013 0901   CL 109 07/29/2023 1025   CL 106 12/18/2012 0842   CO2 24 07/29/2023 1025   CO2 27 12/20/2013 0901   BUN 21 07/29/2023 1025   BUN 23.2 12/20/2013 0901   CREATININE 0.86 07/29/2023 1025   CREATININE 1.04 12/31/2014 1023   CREATININE 1.0 12/20/2013 0901      Component Value Date/Time   CALCIUM 8.8 (L) 07/29/2023 1025   CALCIUM 10.1 12/20/2013 0901   ALKPHOS 92 07/29/2023 1025   ALKPHOS 135 (H) 12/31/2014 1023   ALKPHOS 144 12/20/2013 0901   AST 21 07/29/2023 1025   AST 21 12/20/2013 0901   ALT 13 07/29/2023 1025   ALT 28 12/31/2014 1023   ALT 18 12/20/2013 0901   BILITOT 0.5 07/29/2023 1025   BILITOT 0.60 12/20/2013 0901  RADIOGRAPHIC STUDIES: I have personally reviewed the radiological images as listed and agreed with the findings in the report. No results found.   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 AUG 2019- NED; fibroid uterus; endometrial thickening [see discussion below]. Colo- 2019- wnl [again in 2024];  stable CEA 6 months ago-normal. Awaiting CEA from today. stable  # Clinically no evidence of recurrence. Continue surveillance without imaging.    # Elevated BG 114 [post BF]- not diabetic. Discussed ZO:XWRUEAV changes. Stable.   # DCIS status post lumpectomy- S/P Aromasin. Mammogram in NOV 2023--WNL Stable.   # Slightly low GFR- recommend hydration; on Tylenol [Tooth pain/oral surgery]and follow up with PCP-  Stable. .   # IV Access- port palcement-port flushes very 3 months.   # DISPOSITION: # port flush in 3 months- # Follow-up with in  6 months-MD/port flush; CBC CMP CEA- Dr.B.     Orders Placed This Encounter  Procedures   CBC with Differential (Cancer Center Only)    Standing Status:   Future    Standing Expiration Date:   07/28/2024   CMP (Cancer Center only)    Standing Status:   Future    Standing Expiration Date:   07/28/2024   CEA    Standing Status:   Future    Standing Expiration Date:   07/28/2024    All questions were answered. The patient knows to call the clinic with any problems, questions or concerns.      Earna Coder, MD 07/29/2023 11:26 AM

## 2023-07-30 LAB — CEA: CEA: 1.7 ng/mL (ref 0.0–4.7)

## 2023-08-06 ENCOUNTER — Other Ambulatory Visit: Payer: Self-pay | Admitting: Family Medicine

## 2023-09-26 ENCOUNTER — Other Ambulatory Visit: Payer: Self-pay | Admitting: Family Medicine

## 2023-09-26 DIAGNOSIS — Z1231 Encounter for screening mammogram for malignant neoplasm of breast: Secondary | ICD-10-CM

## 2023-09-28 DIAGNOSIS — Z23 Encounter for immunization: Secondary | ICD-10-CM | POA: Diagnosis not present

## 2023-10-18 ENCOUNTER — Ambulatory Visit (INDEPENDENT_AMBULATORY_CARE_PROVIDER_SITE_OTHER): Payer: Medicare Other | Admitting: Family Medicine

## 2023-10-18 ENCOUNTER — Encounter: Payer: Self-pay | Admitting: Family Medicine

## 2023-10-18 VITALS — BP 110/80 | HR 78 | Temp 97.8°F | Ht 62.0 in | Wt 161.1 lb

## 2023-10-18 DIAGNOSIS — R0609 Other forms of dyspnea: Secondary | ICD-10-CM | POA: Diagnosis not present

## 2023-10-18 DIAGNOSIS — E1159 Type 2 diabetes mellitus with other circulatory complications: Secondary | ICD-10-CM

## 2023-10-18 DIAGNOSIS — I152 Hypertension secondary to endocrine disorders: Secondary | ICD-10-CM | POA: Diagnosis not present

## 2023-10-18 DIAGNOSIS — E118 Type 2 diabetes mellitus with unspecified complications: Secondary | ICD-10-CM

## 2023-10-18 LAB — CBC WITH DIFFERENTIAL/PLATELET
Basophils Absolute: 0.1 10*3/uL (ref 0.0–0.1)
Basophils Relative: 0.8 % (ref 0.0–3.0)
Eosinophils Absolute: 0.1 10*3/uL (ref 0.0–0.7)
Eosinophils Relative: 1.3 % (ref 0.0–5.0)
HCT: 44.5 % (ref 36.0–46.0)
Hemoglobin: 15 g/dL (ref 12.0–15.0)
Lymphocytes Relative: 17.5 % (ref 12.0–46.0)
Lymphs Abs: 1.1 10*3/uL (ref 0.7–4.0)
MCHC: 33.7 g/dL (ref 30.0–36.0)
MCV: 97.7 fL (ref 78.0–100.0)
Monocytes Absolute: 0.6 10*3/uL (ref 0.1–1.0)
Monocytes Relative: 9.9 % (ref 3.0–12.0)
Neutro Abs: 4.6 10*3/uL (ref 1.4–7.7)
Neutrophils Relative %: 70.5 % (ref 43.0–77.0)
Platelets: 285 10*3/uL (ref 150.0–400.0)
RBC: 4.55 Mil/uL (ref 3.87–5.11)
RDW: 13.1 % (ref 11.5–15.5)
WBC: 6.5 10*3/uL (ref 4.0–10.5)

## 2023-10-18 LAB — POCT GLYCOSYLATED HEMOGLOBIN (HGB A1C): Hemoglobin A1C: 5.6 % (ref 4.0–5.6)

## 2023-10-18 LAB — BRAIN NATRIURETIC PEPTIDE: Pro B Natriuretic peptide (BNP): 19 pg/mL (ref 0.0–100.0)

## 2023-10-18 LAB — TSH: TSH: 3.95 u[IU]/mL (ref 0.35–5.50)

## 2023-10-18 NOTE — Patient Instructions (Signed)
Start zyrtec/Xyzal at bedtime (No decongestant)   Can add Flonase 2 sprays per nostril daily.   Please stop at the lab to have labs drawn.

## 2023-10-18 NOTE — Assessment & Plan Note (Signed)
Acute, may simply be associated with allergies and allergy season.  Recommended Zyrtec at bedtime and Flonase 2 sprays per nostril daily. EKG in office was stable from previous in 2015, no ST changes, new Q wave no LVH. Will evaluate with labs.

## 2023-10-18 NOTE — Progress Notes (Signed)
Patient ID: Susan Duffy, female    DOB: [birth year], 77 y.o.   MRN: [id number]  This visit was conducted in person.  Blood pressure 110/80, pulse 78, temperature 97.8 F (36.6 C), temperature source Temporal, height 5\' 2"  (1.575 m), weight 161 lb 2 oz (73.1 kg), SpO2 99%.   CC:  Chief Complaint  Patient presents with   Diabetes    Subjective:   HPI: MYSTERY VILLATORO is a 77 y.o. female presenting on 10/18/2023 for  6 month DM follow up.   She has been having issues with post nasal drip, hoarse voice with allergies.  She has  noted decreased endurance, feeling like  more dyspnea with more strenous activities.. has to take a break vacuuming, cleaning floor etc.  No issue with walking dog.  Passes as soon as she rests.  No wheeze. No cough  She has noted this in last 1-2 months. Wrose in last few weeks with  worsening leaf allergy  No chest pain.    Nml CMP and cbc in 07/2023  Diabetes: Diet controlled Lab Results  Component Value Date   HGBA1C 5.6 10/18/2023  Using medications without difficulties: Hypoglycemic episodes: Hyperglycemic episodes: Feet problems: no  ulcer Blood Sugars averaging: eye exam within last year:   Diet compliance: heart healthy diet... eating motre fruits and veggies., more yogurt. Exercise: walking daily Other complaints:  Hypertension:   Good control of blood pressure today on losartan 50 mg p.o. daily BP Readings from Last 3 Encounters:  10/18/23 110/80  07/29/23 (!) 144/90  04/14/23 (!) 152/72  Using medication without problems or lightheadedness:  none Chest pain with exertion: none Edema:  chronic in left leg, none worse. Short of breath: see above Average home BPs: Other issues: Wt Readings from Last 3 Encounters:  10/18/23 161 lb 2 oz (73.1 kg)  07/29/23 165 lb 12.8 oz (75.2 kg)  04/14/23 171 lb (77.6 kg)      BP Readings from Last 3 Encounters:  10/18/23 110/80  07/29/23 (!) 144/90  04/14/23 (!) 152/72      Vitamin D deficiency: Resolved with supplementation   Relevant past medical, surgical, family and social history reviewed and updated as indicated. Interim medical history since our last visit reviewed. Allergies and medications reviewed and updated. Outpatient Medications Prior to Visit  Medication Sig Dispense Refill   acetaminophen (TYLENOL) 500 MG tablet as needed for moderate pain. Take 1-2 by mouth every 6 hours as needed      aspirin 81 MG chewable tablet Chew 81 mg by mouth daily.     cholecalciferol (VITAMIN D) 400 UNITS TABS tablet Take 400 Units by mouth 2 (two) times daily.     Cyanocobalamin (VITAMIN B-12) 2000 MCG TBCR Take 1 tablet by mouth daily.     lidocaine-prilocaine (EMLA) cream Apply 1 application topically as needed. To port a cath site approximately 1 to 2 hours prior to port needle insertion. 30 g 12   losartan (COZAAR) 50 MG tablet TAKE 1 TABLET BY MOUTH EVERY DAY 90 tablet 1   simvastatin (ZOCOR) 40 MG tablet TAKE 1 TABLET BY MOUTH EVERYDAY AT BEDTIME 90 tablet 3   Facility-Administered Medications Prior to Visit  Medication Dose Route Frequency Provider Last Rate Last Admin   sodium chloride flush (NS) 0.9 % injection 10 mL  10 mL Intravenous PRN Earna Coder, MD   10 mL at 01/05/16 1135     Per HPI unless specifically indicated in ROS section below Review  of Systems  Constitutional:  Negative for fatigue and fever.  HENT:  Positive for postnasal drip. Negative for congestion.   Eyes:  Negative for pain.  Respiratory:  Positive for shortness of breath. Negative for cough.   Cardiovascular:  Negative for chest pain, palpitations and leg swelling.  Gastrointestinal:  Negative for abdominal pain.  Genitourinary:  Negative for dysuria and vaginal bleeding.  Musculoskeletal:  Negative for back pain.  Neurological:  Negative for syncope, light-headedness and headaches.  Psychiatric/Behavioral:  Negative for dysphoric mood.    Objective:  BP  110/80 (BP Location: Right Arm, Patient Position: Sitting, Cuff Size: Large)   Pulse 78   Temp 97.8 F (36.6 C) (Temporal)   Ht 5\' 2"  (1.575 m)   Wt 161 lb 2 oz (73.1 kg)   SpO2 99%   BMI 29.47 kg/m   Wt Readings from Last 3 Encounters:  10/18/23 161 lb 2 oz (73.1 kg)  07/29/23 165 lb 12.8 oz (75.2 kg)  04/14/23 171 lb (77.6 kg)      Physical Exam Constitutional:      General: She is not in acute distress.    Appearance: Normal appearance. She is well-developed. She is not ill-appearing or toxic-appearing.  HENT:     Head: Normocephalic.     Right Ear: Hearing, tympanic membrane, ear canal and external ear normal. Tympanic membrane is not erythematous, retracted or bulging.     Left Ear: Hearing, tympanic membrane, ear canal and external ear normal. Tympanic membrane is not erythematous, retracted or bulging.     Nose: No mucosal edema or rhinorrhea.     Right Sinus: No maxillary sinus tenderness or frontal sinus tenderness.     Left Sinus: No maxillary sinus tenderness or frontal sinus tenderness.     Mouth/Throat:     Pharynx: Uvula midline.  Eyes:     General: Lids are normal. Lids are everted, no foreign bodies appreciated.     Conjunctiva/sclera: Conjunctivae normal.     Pupils: Pupils are equal, round, and reactive to light.  Neck:     Thyroid: No thyroid mass or thyromegaly.     Vascular: No carotid bruit.     Trachea: Trachea normal.  Cardiovascular:     Rate and Rhythm: Normal rate and regular rhythm.     Pulses: Normal pulses.     Heart sounds: Normal heart sounds, S1 normal and S2 normal. No murmur heard.    No friction rub. No gallop.  Pulmonary:     Effort: Pulmonary effort is normal. No tachypnea or respiratory distress.     Breath sounds: Normal breath sounds. No decreased breath sounds, wheezing, rhonchi or rales.  Abdominal:     General: Bowel sounds are normal.     Palpations: Abdomen is soft.     Tenderness: There is no abdominal tenderness.   Musculoskeletal:     Cervical back: Normal range of motion and neck supple.  Skin:    General: Skin is warm and dry.     Findings: No rash.  Neurological:     Mental Status: She is alert.  Psychiatric:        Mood and Affect: Mood is not anxious or depressed.        Speech: Speech normal.        Behavior: Behavior normal. Behavior is cooperative.        Thought Content: Thought content normal.        Judgment: Judgment normal.  Results for orders placed or performed in visit on 10/18/23  POCT glycosylated hemoglobin (Hb A1C)  Result Value Ref Range   Hemoglobin A1C 5.6 4.0 - 5.6 %   HbA1c POC (<> result, manual entry)     HbA1c, POC (prediabetic range)     HbA1c, POC (controlled diabetic range)       COVID 19 screen:  No recent travel or known exposure to COVID19 The patient denies respiratory symptoms of COVID 19 at this time. The importance of social distancing was discussed today.   Assessment and Plan   Problem List Items Addressed This Visit     Controlled diabetes mellitus type 2 with complications (HCC)    Associated with HTN and past TIA Well controlled with diet.      Dyspnea on exertion    Acute, may simply be associated with allergies and allergy season.  Recommended Zyrtec at bedtime and Flonase 2 sprays per nostril daily. EKG in office was stable from previous in 2015, no ST changes, new Q wave no LVH. Will evaluate with labs.      Relevant Orders   CBC with Differential/Platelet   TSH   Brain natriuretic peptide   EKG 12-Lead (Completed)   Hypertension associated with diabetes (HCC) (Chronic)    Stable, chronic.  Continue current medication.  Losartan 50 mg p.o. daily      Other Visit Diagnoses     Type 2 diabetes mellitus with other circulatory complication, without long-term current use of insulin (HCC)    -  Primary   Relevant Orders   POCT glycosylated hemoglobin (Hb A1C) (Completed)      Orders Placed This Encounter   Procedures   CBC with Differential/Platelet   TSH   Brain natriuretic peptide   POCT glycosylated hemoglobin (Hb A1C)   EKG 12-Lead    [name], MD

## 2023-10-18 NOTE — Assessment & Plan Note (Signed)
Stable, chronic.  Continue current medication.  Losartan 50 mg p.o. daily

## 2023-10-18 NOTE — Assessment & Plan Note (Signed)
Associated with HTN and past TIA Well controlled with diet. 

## 2023-10-20 ENCOUNTER — Ambulatory Visit
Admission: RE | Admit: 2023-10-20 | Discharge: 2023-10-20 | Disposition: A | Discharge status: Home / Self Care | Payer: Medicare Other | Source: Ambulatory Visit | Attending: Family Medicine | Admitting: Family Medicine

## 2023-10-20 DIAGNOSIS — Z1231 Encounter for screening mammogram for malignant neoplasm of breast: Secondary | ICD-10-CM | POA: Insufficient documentation

## 2023-10-24 ENCOUNTER — Inpatient Hospital Stay: Payer: Medicare Other | Attending: Internal Medicine

## 2023-10-24 DIAGNOSIS — Z95828 Presence of other vascular implants and grafts: Secondary | ICD-10-CM

## 2023-10-24 DIAGNOSIS — Z85038 Personal history of other malignant neoplasm of large intestine: Secondary | ICD-10-CM | POA: Insufficient documentation

## 2023-10-24 DIAGNOSIS — Z452 Encounter for adjustment and management of vascular access device: Secondary | ICD-10-CM | POA: Diagnosis not present

## 2023-10-24 MED ORDER — HEPARIN SOD (PORK) LOCK FLUSH 100 UNIT/ML IV SOLN
500.0000 [IU] | Freq: Once | INTRAVENOUS | Status: AC
  Administered 2023-10-24: 500 [IU] via INTRAVENOUS
  Filled 2023-10-24: qty 5

## 2024-01-30 ENCOUNTER — Inpatient Hospital Stay: Payer: Medicare Other

## 2024-01-30 ENCOUNTER — Inpatient Hospital Stay: Payer: Medicare Other | Attending: Internal Medicine | Admitting: Internal Medicine

## 2024-01-30 ENCOUNTER — Encounter: Payer: Self-pay | Admitting: Internal Medicine

## 2024-01-30 VITALS — BP 133/82 | HR 76 | Temp 97.4°F | Ht 62.0 in | Wt 154.3 lb

## 2024-01-30 DIAGNOSIS — Z85038 Personal history of other malignant neoplasm of large intestine: Secondary | ICD-10-CM | POA: Diagnosis not present

## 2024-01-30 DIAGNOSIS — Z95828 Presence of other vascular implants and grafts: Secondary | ICD-10-CM

## 2024-01-30 DIAGNOSIS — Z86000 Personal history of in-situ neoplasm of breast: Secondary | ICD-10-CM | POA: Diagnosis not present

## 2024-01-30 DIAGNOSIS — R739 Hyperglycemia, unspecified: Secondary | ICD-10-CM | POA: Insufficient documentation

## 2024-01-30 LAB — CBC WITH DIFFERENTIAL (CANCER CENTER ONLY)
Abs Immature Granulocytes: 0.01 10*3/uL (ref 0.00–0.07)
Basophils Absolute: 0.1 10*3/uL (ref 0.0–0.1)
Basophils Relative: 1 %
Eosinophils Absolute: 0.1 10*3/uL (ref 0.0–0.5)
Eosinophils Relative: 2 %
HCT: 42.7 % (ref 36.0–46.0)
Hemoglobin: 14.2 g/dL (ref 12.0–15.0)
Immature Granulocytes: 0 %
Lymphocytes Relative: 25 %
Lymphs Abs: 1.5 10*3/uL (ref 0.7–4.0)
MCH: 32.1 pg (ref 26.0–34.0)
MCHC: 33.3 g/dL (ref 30.0–36.0)
MCV: 96.4 fL (ref 80.0–100.0)
Monocytes Absolute: 0.5 10*3/uL (ref 0.1–1.0)
Monocytes Relative: 9 %
Neutro Abs: 3.7 10*3/uL (ref 1.7–7.7)
Neutrophils Relative %: 63 %
Platelet Count: 234 10*3/uL (ref 150–400)
RBC: 4.43 MIL/uL (ref 3.87–5.11)
RDW: 12.3 % (ref 11.5–15.5)
WBC Count: 5.8 10*3/uL (ref 4.0–10.5)
nRBC: 0 % (ref 0.0–0.2)

## 2024-01-30 LAB — CMP (CANCER CENTER ONLY)
ALT: 13 U/L (ref 0–44)
AST: 21 U/L (ref 15–41)
Albumin: 3.7 g/dL (ref 3.5–5.0)
Alkaline Phosphatase: 79 U/L (ref 38–126)
Anion gap: 8 (ref 5–15)
BUN: 20 mg/dL (ref 8–23)
CO2: 25 mmol/L (ref 22–32)
Calcium: 8.8 mg/dL — ABNORMAL LOW (ref 8.9–10.3)
Chloride: 105 mmol/L (ref 98–111)
Creatinine: 0.89 mg/dL (ref 0.44–1.00)
GFR, Estimated: >60 mL/min (ref >60)
Glucose, Bld: 138 mg/dL — ABNORMAL HIGH (ref 70–99)
Potassium: 3.7 mmol/L (ref 3.5–5.1)
Sodium: 138 mmol/L (ref 135–145)
Total Bilirubin: 0.6 mg/dL (ref 0.0–1.2)
Total Protein: 7.4 g/dL (ref 6.5–8.1)

## 2024-01-30 MED ORDER — HEPARIN SOD (PORK) LOCK FLUSH 100 UNIT/ML IV SOLN
500.0000 [IU] | Freq: Once | INTRAVENOUS | Status: AC
  Administered 2024-01-30: 500 [IU] via INTRAVENOUS
  Filled 2024-01-30: qty 5

## 2024-01-30 MED ORDER — SODIUM CHLORIDE 0.9% FLUSH
10.0000 mL | Freq: Once | INTRAVENOUS | Status: AC
  Administered 2024-01-30: 10 mL via INTRAVENOUS
  Filled 2024-01-30: qty 10

## 2024-01-30 NOTE — Progress Notes (Signed)
 [facility] OFFICE PROGRESS NOTE  Patient Care Team: Excell Seltzer, MD as PCP - General Benita Gutter, RN as Oncology Nurse Navigator Earna Coder, MD as Consulting Physician (Oncology)   Cancer Staging  No matching staging information was found for the patient.    Oncology History Overview Note  # 2005- COLON CA [Stage II- left sided; s/p Surgery in [facility] [place]]; no Adj chemo.    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    # Nov 2010-LLE DVT [on Tam] on coumadin; AUG 2017- STOP coumadin  #August 2020-endometrial biopsy-Dr. Johnnette Litter negative for malignancy.  # ; PN-G-1; port flush q 6 w   # Left kidney cyst [Feb 2017]  # colonoscopy- 2019 [Dr.Mann; [place]]  DIAGNOSIS: METASTATIC COLON CA  STAGE: IV; NED        ;GOALS: control  CURRENT/MOST RECENT THERAPY: surveillance    History of colon cancer   INTERVAL HISTORY: Ambulating with a cane.  Alone.  Susan Duffy 78 y.o.  female pleasant patient above history of metastatic colon cancer- stage IV-NED currently on surveillance is here for follow-up.  Patient is interested is discussing port removal. She says it hasn't been used since 2013 but still gets flushed.    Other No worsening aches and pains.  No vaginal bleeding.  No nausea vomiting headache.  No blood in stools or black or stools.  Review of Systems  Constitutional:  Negative for chills, diaphoresis, fever, malaise/fatigue and weight loss.  HENT:  Negative for nosebleeds and sore throat.   Eyes:  Negative for double vision.  Respiratory:  Negative for cough, hemoptysis, sputum production, shortness of breath and wheezing.   Cardiovascular:  Negative for chest pain, palpitations,  orthopnea and leg swelling.  Gastrointestinal:  Negative for abdominal pain, blood in stool, constipation, diarrhea, heartburn, melena, nausea and vomiting.  Genitourinary:  Negative for dysuria, frequency and urgency.  Musculoskeletal:  Positive for back pain and joint pain.  Skin: Negative.  Negative for itching and rash.  Neurological:  Negative for dizziness, tingling, focal weakness, weakness and headaches.  Endo/Heme/Allergies:  Does not bruise/bleed easily.  Psychiatric/Behavioral:  Negative for depression. The patient is not nervous/anxious and does not have insomnia.       PAST MEDICAL HISTORY :  Past Medical History:  Diagnosis Date   Allergy    Breast CA (HCC)    Breast cancer (HCC) 2010   in situ and took radiation, no chemo needed   Colon cancer (HCC)    stage 4 colon cancer   History of chemotherapy 02/2011-08/2011   FOLFOX/AVASTIN- 8 CYCLES   History of DVT (deep vein thrombosis) 2010   while taking tamoxifen-left lower extremity   History of peripheral neuropathy    Hyperlipidemia    Hypertension    IDA (iron deficiency anemia)    Osteoarthritis    Personal history of chemotherapy    colon ca   Personal history of radiation therapy    breast and colon   TIA (transient ischemic attack) 03/08/2011   Vitamin D deficiency     PAST SURGICAL HISTORY :   Past Surgical History:  Procedure Laterality Date   BREAST EXCISIONAL BIOPSY Left 12/2008   s/p lumpectomy and radiation left dcis  BREAST EXCISIONAL BIOPSY Left    fibroadeoma reoved age 70   BREAST LUMPECTOMY  2010   left breast   broken ankle  Left 08/2012   COLON SURGERY     colectomy, partial   INTRACAPSULAR CATARACT EXTRACTION Bilateral    LOBECTOMY  06/2009   metastatic colon cancer    FAMILY HISTORY :   Family History  Problem Relation Age of Onset   Hypertension Mother    Stroke Mother    Coronary artery disease Mother    Thyroid disease Mother    Diabetes Father    Hypertension Father     Lung cancer Father    Heart attack Maternal Grandfather    Breast cancer Maternal Grandmother    Thyroid disease Brother    Diabetes Paternal Grandmother    Colon cancer Paternal Grandfather    Deep vein thrombosis Other     SOCIAL HISTORY:   Social History   Tobacco Use   Smoking status: Never   Smokeless tobacco: Never  Vaping Use   Vaping status: Never Used  Substance Use Topics   Alcohol use: No   Drug use: No    ALLERGIES:  is allergic to amoxicillin, erythromycin, and nitrofuran derivatives.  MEDICATIONS:  Current Outpatient Medications  Medication Sig Dispense Refill   acetaminophen (TYLENOL) 500 MG tablet as needed for moderate pain. Take 1-2 by mouth every 6 hours as needed      aspirin 81 MG chewable tablet Chew 81 mg by mouth daily.     cholecalciferol (VITAMIN D) 400 UNITS TABS tablet Take 400 Units by mouth 2 (two) times daily.     Cyanocobalamin (VITAMIN B-12) 2000 MCG TBCR Take 1 tablet by mouth daily.     losartan (COZAAR) 50 MG tablet TAKE 1 TABLET BY MOUTH EVERY DAY 90 tablet 1   simvastatin (ZOCOR) 40 MG tablet TAKE 1 TABLET BY MOUTH EVERYDAY AT BEDTIME 90 tablet 3   No current facility-administered medications for this visit.   Facility-Administered Medications Ordered in Other Visits  Medication Dose Route Frequency Provider Last Rate Last Admin   sodium chloride flush (NS) 0.9 % injection 10 mL  10 mL Intravenous PRN Earna Coder, MD   10 mL at 01/05/16 1135    PHYSICAL EXAMINATION: ECOG PERFORMANCE STATUS: 0 - Asymptomatic  BP 133/82 (BP Location: Right Arm, Patient Position: Sitting, Cuff Size: Normal)   Pulse 76   Temp (!) 97.4 F (36.3 C) (Tympanic)   Ht 5\' 2"  (1.575 m)   Wt 154 lb 4.8 oz (70 kg)   SpO2 96%   BMI 28.22 kg/m   Filed Weights   01/30/24 1023  Weight: 154 lb 4.8 oz (70 kg)   Physical Exam Constitutional:      Comments: Obese.  Walks by herself.  She is alone.  HENT:     Head: Normocephalic and atraumatic.      Mouth/Throat:     Pharynx: No oropharyngeal exudate.  Eyes:     Pupils: Pupils are equal, round, and reactive to light.  Cardiovascular:     Rate and Rhythm: Normal rate and regular rhythm.  Pulmonary:     Effort: No respiratory distress.     Breath sounds: No wheezing.  Abdominal:     General: Bowel sounds are normal. There is no distension.     Palpations: Abdomen is soft. There is no mass.     Tenderness: There is no abdominal tenderness. There is no guarding or rebound.  Musculoskeletal:  General: No tenderness. Normal range of motion.     Cervical back: Normal range of motion and neck supple.  Skin:    General: Skin is warm.  Neurological:     Mental Status: She is alert and oriented to person, place, and time.  Psychiatric:        Mood and Affect: Affect normal.     SKIN:  no rashes or significant lesions    LABORATORY DATA:  I have reviewed the data as listed    Component Value Date/Time   NA 138 01/30/2024 1005   NA 141 12/20/2013 0901   K 3.7 01/30/2024 1005   K 4.1 12/20/2013 0901   CL 105 01/30/2024 1005   CL 106 12/18/2012 0842   CO2 25 01/30/2024 1005   CO2 27 12/20/2013 0901   GLUCOSE 138 (H) 01/30/2024 1005   GLUCOSE 103 12/20/2013 0901   GLUCOSE 109 (H) 12/18/2012 0842   BUN 20 01/30/2024 1005   BUN 23.2 12/20/2013 0901   CREATININE 0.89 01/30/2024 1005   CREATININE 1.04 12/31/2014 1023   CREATININE 1.0 12/20/2013 0901   CALCIUM 8.8 (L) 01/30/2024 1005   CALCIUM 10.1 12/20/2013 0901   PROT 7.4 01/30/2024 1005   PROT 7.9 12/31/2014 1023   PROT 8.5 (H) 12/20/2013 0901   ALBUMIN 3.7 01/30/2024 1005   ALBUMIN 3.3 (L) 12/31/2014 1023   ALBUMIN 3.7 12/20/2013 0901   AST 21 01/30/2024 1005   AST 21 12/20/2013 0901   ALT 13 01/30/2024 1005   ALT 28 12/31/2014 1023   ALT 18 12/20/2013 0901   ALKPHOS 79 01/30/2024 1005   ALKPHOS 135 (H) 12/31/2014 1023   ALKPHOS 144 12/20/2013 0901   BILITOT 0.6 01/30/2024 1005   BILITOT 0.60  12/20/2013 0901   GFRNONAA >60 01/30/2024 1005   GFRNONAA 56 (L) 12/31/2014 1023   GFRNONAA 57 (L) 06/24/2014 1300   GFRAA >60 07/30/2020 1009   GFRAA >60 12/31/2014 1023   GFRAA >60 06/24/2014 1300    No results found for: "SPEP", "UPEP"  Lab Results  Component Value Date   WBC 5.8 01/30/2024   NEUTROABS 3.7 01/30/2024   HGB 14.2 01/30/2024   HCT 42.7 01/30/2024   MCV 96.4 01/30/2024   PLT 234 01/30/2024      Chemistry      Component Value Date/Time   NA 138 01/30/2024 1005   NA 141 12/20/2013 0901   K 3.7 01/30/2024 1005   K 4.1 12/20/2013 0901   CL 105 01/30/2024 1005   CL 106 12/18/2012 0842   CO2 25 01/30/2024 1005   CO2 27 12/20/2013 0901   BUN 20 01/30/2024 1005   BUN 23.2 12/20/2013 0901   CREATININE 0.89 01/30/2024 1005   CREATININE 1.04 12/31/2014 1023   CREATININE 1.0 12/20/2013 0901      Component Value Date/Time   CALCIUM 8.8 (L) 01/30/2024 1005   CALCIUM 10.1 12/20/2013 0901   ALKPHOS 79 01/30/2024 1005   ALKPHOS 135 (H) 12/31/2014 1023   ALKPHOS 144 12/20/2013 0901   AST 21 01/30/2024 1005   AST 21 12/20/2013 0901   ALT 13 01/30/2024 1005   ALT 28 12/31/2014 1023   ALT 18 12/20/2013 0901   BILITOT 0.6 01/30/2024 1005   BILITOT 0.60 12/20/2013 0901       RADIOGRAPHIC STUDIES: I have personally reviewed the radiological images as listed and agreed with the findings in the report. No results found.   ASSESSMENT & PLAN:  History of colon cancer # Metastatic  colon cancer chest wall lung recurrence/ status post lobectomy- 2010. AUG 2019- NED; fibroid uterus; endometrial thickening [see discussion below]. Colo- 2019- wnl [again in 2024];  stable CEA 6 months ago-normal. Awaiting CEA from today. stable  # Clinically no evidence of recurrence. Continue surveillance without imaging.- stable.    # Elevated BG 138  [post BF]- not diabetic. Discussed ZO:XWRUEAV changes. Stable.   # DCIS status post lumpectomy- S/P Aromasin. Mammogram in NOV  2024--WNL  stable.  # IV Access- port placement [2010]-port flushes q 3 months. Pt interested in port explantation- recommend referral to IR.   #Since patient is clinically stable I think is reasonable for the patient to follow-up with PCP/can follow-up with [place] as needed.  Patient comfortable with the plan; to call us if any questions or concerns in the interim. I sent a message Dr.Bedsole re; checking CEA q 6 M.   # DISPOSITION: # port explantation- recommend referral to IR. - # Follow-up as needed-  Dr.B.      Orders Placed This Encounter  Procedures   IR Removal Tun Access W/ Port W/O FL    Standing Status:   Future    Expected Date:   02/13/2024    Expiration Date:   01/29/2025    Reason for Exam (SYMPTOM  OR DIAGNOSIS REQUIRED):   Cancer treatments completed    Preferred Imaging Location?:   [facility]   CBC with Differential (Cancer Center Only)    Standing Status:   Future    Expected Date:   07/09/2024    Expiration Date:   01/29/2025   CMP (Cancer Center only)    Standing Status:   Future    Expected Date:   07/09/2024    Expiration Date:   01/29/2025   CEA    Standing Status:   Future    Expected Date:   07/09/2024    Expiration Date:   01/29/2025    All questions were answered. The patient knows to call the clinic with any problems, questions or concerns.      Earna Coder, MD 01/30/2024 11:17 AM

## 2024-01-30 NOTE — Assessment & Plan Note (Addendum)
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 AUG 2019- NED; fibroid uterus; endometrial thickening [see discussion below]. Colo- 2019- wnl [again in 2024];  stable CEA 6 months ago-normal. Awaiting CEA from today. stable  # Clinically no evidence of recurrence. Continue surveillance without imaging.- stable.    # Elevated BG 138  [post BF]- not diabetic. Discussed ZO:XWRUEAV changes. Stable.   # DCIS status post lumpectomy- S/P Aromasin. Mammogram in NOV 2024--WNL  stable.  # IV Access- port placement [2010]-port flushes q 3 months. Pt interested in port explantation- recommend referral to IR.   #Since patient is clinically stable I think is reasonable for the patient to follow-up with PCP/can follow-up with [place] as needed.  Patient comfortable with the plan; to call us if any questions or concerns in the interim. I sent a message Dr.Bedsole re; checking CEA q 6 M.   # DISPOSITION: # port explantation- recommend referral to IR. - # Follow-up as needed-  Dr.B.

## 2024-01-30 NOTE — Progress Notes (Signed)
 Pt is interested is discussing port removal. She says it hasn't been used since 2013 but still gets flushes.

## 2024-01-31 LAB — CEA: CEA: 1.7 ng/mL (ref 0.0–4.7)

## 2024-02-01 ENCOUNTER — Other Ambulatory Visit: Payer: Self-pay | Admitting: Family Medicine

## 2024-02-21 ENCOUNTER — Telehealth: Payer: Self-pay | Admitting: Family Medicine

## 2024-02-21 DIAGNOSIS — E118 Type 2 diabetes mellitus with unspecified complications: Secondary | ICD-10-CM

## 2024-02-21 NOTE — Telephone Encounter (Signed)
 Sent MyChart message Ordered UACR for CPX labs

## 2024-02-28 DIAGNOSIS — Z961 Presence of intraocular lens: Secondary | ICD-10-CM | POA: Diagnosis not present

## 2024-03-14 ENCOUNTER — Telehealth: Payer: Self-pay | Admitting: *Deleted

## 2024-03-14 NOTE — Telephone Encounter (Signed)
 Copied from CRM [id number]. Topic: Appointments - Appointment Scheduling >> Mar 14, 2024  2:31 PM Truddie Crumble wrote: Patient/patient representative is calling to schedule an appointment. Refer to attachments for appointment information.   Patient called stating she is returning a call to schedule a medicare wellness appointment and patient stated also that someone called her stating that they need her medicare number and she assume it was a scam. Patient think it was from place called [facility]

## 2024-03-14 NOTE — Telephone Encounter (Signed)
 Spoke with Susan Duffy and let her know that the call she got from [facility] was probably a scan.  Patient states she agrees and hung up on them when they has her to verify her Medicare number.  Nothing further is needed at this time.

## 2024-03-26 ENCOUNTER — Telehealth: Payer: Self-pay | Admitting: *Deleted

## 2024-03-26 DIAGNOSIS — E1159 Type 2 diabetes mellitus with other circulatory complications: Secondary | ICD-10-CM

## 2024-03-26 DIAGNOSIS — E559 Vitamin D deficiency, unspecified: Secondary | ICD-10-CM

## 2024-03-26 DIAGNOSIS — E1169 Type 2 diabetes mellitus with other specified complication: Secondary | ICD-10-CM

## 2024-03-26 NOTE — Telephone Encounter (Signed)
-----   Message from Gerry Krone sent at 03/22/2024  3:13 PM EDT ----- Regarding: Lab orders for Tue, 5.6.25 Patient is scheduled for CPX labs, please order future labs, Thanks , Anselmo Kings

## 2024-04-10 ENCOUNTER — Other Ambulatory Visit (INDEPENDENT_AMBULATORY_CARE_PROVIDER_SITE_OTHER): Payer: Medicare Other

## 2024-04-10 ENCOUNTER — Encounter: Payer: Self-pay | Admitting: Family Medicine

## 2024-04-10 DIAGNOSIS — E559 Vitamin D deficiency, unspecified: Secondary | ICD-10-CM

## 2024-04-10 DIAGNOSIS — E1169 Type 2 diabetes mellitus with other specified complication: Secondary | ICD-10-CM | POA: Diagnosis not present

## 2024-04-10 DIAGNOSIS — E1159 Type 2 diabetes mellitus with other circulatory complications: Secondary | ICD-10-CM

## 2024-04-10 DIAGNOSIS — E785 Hyperlipidemia, unspecified: Secondary | ICD-10-CM | POA: Diagnosis not present

## 2024-04-10 LAB — LIPID PANEL
Cholesterol: 136 mg/dL (ref 0–200)
HDL: 43.9 mg/dL (ref >39.00)
LDL Cholesterol: 71 mg/dL (ref 0–99)
NonHDL: 92.41
Total CHOL/HDL Ratio: 3
Triglycerides: 108 mg/dL (ref 0.0–149.0)
VLDL: 21.6 mg/dL (ref 0.0–40.0)

## 2024-04-10 LAB — HEMOGLOBIN A1C: Hgb A1c MFr Bld: 5.8 % (ref 4.6–6.5)

## 2024-04-10 LAB — COMPREHENSIVE METABOLIC PANEL WITH GFR
ALT: 14 U/L (ref 0–35)
AST: 19 U/L (ref 0–37)
Albumin: 4.1 g/dL (ref 3.5–5.2)
Alkaline Phosphatase: 83 U/L (ref 39–117)
BUN: 22 mg/dL (ref 6–23)
CO2: 28 meq/L (ref 19–32)
Calcium: 9.3 mg/dL (ref 8.4–10.5)
Chloride: 104 meq/L (ref 96–112)
Creatinine, Ser: 0.98 mg/dL (ref 0.40–1.20)
GFR: 54.71 mL/min — ABNORMAL LOW (ref >60.00)
Glucose, Bld: 110 mg/dL — ABNORMAL HIGH (ref 70–99)
Potassium: 4.1 meq/L (ref 3.5–5.1)
Sodium: 140 meq/L (ref 135–145)
Total Bilirubin: 0.5 mg/dL (ref 0.2–1.2)
Total Protein: 7.9 g/dL (ref 6.0–8.3)

## 2024-04-10 LAB — VITAMIN D 25 HYDROXY (VIT D DEFICIENCY, FRACTURES): VITD: 50.45 ng/mL (ref 30.00–100.00)

## 2024-04-10 NOTE — Addendum Note (Signed)
 Addended by: Darcella Earnest on: 04/10/2024 08:37 AM   Modules accepted: Orders

## 2024-04-10 NOTE — Progress Notes (Signed)
 No critical labs need to be addressed urgently. We will discuss labs in detail at upcoming office visit.

## 2024-04-11 LAB — MICROALBUMIN / CREATININE URINE RATIO
Creatinine,U: 38 mg/dL
Microalb Creat Ratio: 21.8 mg/g (ref 0.0–30.0)
Microalb, Ur: 0.8 mg/dL (ref 0.0–1.9)

## 2024-04-17 ENCOUNTER — Ambulatory Visit (INDEPENDENT_AMBULATORY_CARE_PROVIDER_SITE_OTHER): Payer: Medicare Other | Admitting: Family Medicine

## 2024-04-17 ENCOUNTER — Encounter: Payer: Self-pay | Admitting: Family Medicine

## 2024-04-17 VITALS — BP 120/70 | HR 73 | Temp 97.5°F | Ht 62.0 in | Wt 156.0 lb

## 2024-04-17 DIAGNOSIS — E1169 Type 2 diabetes mellitus with other specified complication: Secondary | ICD-10-CM

## 2024-04-17 DIAGNOSIS — I152 Hypertension secondary to endocrine disorders: Secondary | ICD-10-CM

## 2024-04-17 DIAGNOSIS — M8589 Other specified disorders of bone density and structure, multiple sites: Secondary | ICD-10-CM | POA: Diagnosis not present

## 2024-04-17 DIAGNOSIS — E1159 Type 2 diabetes mellitus with other circulatory complications: Secondary | ICD-10-CM | POA: Diagnosis not present

## 2024-04-17 DIAGNOSIS — E6609 Other obesity due to excess calories: Secondary | ICD-10-CM

## 2024-04-17 DIAGNOSIS — Z6831 Body mass index (BMI) 31.0-31.9, adult: Secondary | ICD-10-CM

## 2024-04-17 DIAGNOSIS — E559 Vitamin D deficiency, unspecified: Secondary | ICD-10-CM

## 2024-04-17 DIAGNOSIS — E66811 Obesity, class 1: Secondary | ICD-10-CM

## 2024-04-17 DIAGNOSIS — Z85038 Personal history of other malignant neoplasm of large intestine: Secondary | ICD-10-CM | POA: Diagnosis not present

## 2024-04-17 DIAGNOSIS — Z86000 Personal history of in-situ neoplasm of breast: Secondary | ICD-10-CM

## 2024-04-17 DIAGNOSIS — R9389 Abnormal findings on diagnostic imaging of other specified body structures: Secondary | ICD-10-CM

## 2024-04-17 DIAGNOSIS — E118 Type 2 diabetes mellitus with unspecified complications: Secondary | ICD-10-CM

## 2024-04-17 DIAGNOSIS — E785 Hyperlipidemia, unspecified: Secondary | ICD-10-CM | POA: Diagnosis not present

## 2024-04-17 LAB — HM DIABETES FOOT EXAM

## 2024-04-17 NOTE — Assessment & Plan Note (Signed)
 Stable Recommend weight bearing exercise, calcium in diet and vit D supplement 400 IU 1-2 times daily. Repeat due in 2028

## 2024-04-17 NOTE — Assessment & Plan Note (Addendum)
 Planning on removal of port... this has been delayed. Needs CEA every 6 months...  thought she was no longer followed by oncology but appears she will have appt in 07/2024... she will call [facility] to clarify plan.

## 2024-04-17 NOTE — Assessment & Plan Note (Signed)
Associated with HTN and past TIA Well controlled with diet. 

## 2024-04-17 NOTE — Assessment & Plan Note (Signed)
 2020 bx negative.

## 2024-04-17 NOTE — Assessment & Plan Note (Signed)
Stable, chronic.  Continue current medication.  Simvastatin 40 mg p.o. daily  

## 2024-04-17 NOTE — Assessment & Plan Note (Signed)
Chronic, resolved on supplement ?

## 2024-04-17 NOTE — Assessment & Plan Note (Signed)
Stable, chronic.  Continue current medication.  Losartan 50 mg p.o. daily

## 2024-04-17 NOTE — Assessment & Plan Note (Signed)
 Encouraged exercise, weight loss, healthy eating habits. ? ?

## 2024-04-17 NOTE — Assessment & Plan Note (Addendum)
 In remission, yearly mammograms

## 2024-04-17 NOTE — Progress Notes (Signed)
 Patient ID: Susan Duffy, female    DOB: 04/10/1946, 79 y.o.   MRN: [id number]  This visit was conducted in person.  Blood pressure 136/80, pulse 79, height 5\' 2"  (1.575 m), weight 172 lb 14.4 oz (78.4 kg), SpO2 96 %.'  CC:  Chief Complaint  Patient presents with   Annual Exam    MWV scheduled 05/18/2024    Subjective:   HPI: Susan Duffy is a 78 y.o. female presenting on 04/17/2024 for  annual review of chronic health problems.  The patient presents for review of chronic health problems. He/She also has the following acute concerns today: None  AMW scheduled 05/18/2024   No falls in last 12 months.  Flowsheet Row Office Visit from 04/17/2024 in [facility] [place]  PHQ-2 Total Score 0        Diabetes: Diet controlled Lab Results  Component Value Date   HGBA1C 5.8 04/10/2024  Using medications without difficulties: Hypoglycemic episodes: Hyperglycemic episodes: Feet problems: none Blood Sugars averaging: not checking eye exam within last year: yes, pat[facility]  Elevated Cholesterol: LDL AT goal < 70 given past TIA on simvastatin  40 mg daily Lab Results  Component Value Date   CHOL 136 04/10/2024   HDL 43.90 04/10/2024   LDLCALC 71 04/10/2024   LDLDIRECT 74.0 11/14/2018   TRIG 108.0 04/10/2024   CHOLHDL 3 04/10/2024  Using medications without problems: Muscle aches:  Diet compliance: heart healthy diet. Exercise: walking daily Other complaints:  Hypertension:  good control on losartan  50 mg p.o. daily Using medication without problems or lightheadedness:  none Chest pain with exertion: none Edema: none Short of breath: see above Average home BPs: Other issues:   BP Readings from Last 3 Encounters:  04/17/24 120/70  01/30/24 133/82  10/18/23 110/80     Vitamin D  deficiency: Resolved with supplementation   Relevant past medical, surgical, family and social history reviewed and updated as indicated.  Interim medical history since our last visit reviewed. Allergies and medications reviewed and updated. Outpatient Medications Prior to Visit  Medication Sig Dispense Refill   acetaminophen (TYLENOL) 500 MG tablet as needed for moderate pain. Take 1-2 by mouth every 6 hours as needed      aspirin 81 MG chewable tablet Chew 81 mg by mouth daily.     cholecalciferol (VITAMIN D ) 400 UNITS TABS tablet Take 400 Units by mouth 2 (two) times daily.     Cyanocobalamin (VITAMIN B-12) 2000 MCG TBCR Take 1 tablet by mouth daily.     losartan  (COZAAR ) 50 MG tablet TAKE 1 TABLET BY MOUTH EVERY DAY 90 tablet 1   simvastatin  (ZOCOR ) 40 MG tablet TAKE 1 TABLET BY MOUTH EVERYDAY AT BEDTIME 90 tablet 3   Facility-Administered Medications Prior to Visit  Medication Dose Route Frequency Provider Last Rate Last Admin   sodium chloride  flush (NS) 0.9 % injection 10 mL  10 mL Intravenous PRN Brahmanday, Govinda R, MD   10 mL at 01/05/16 1135     Per HPI unless specifically indicated in ROS section below Review of Systems  Constitutional:  Negative for fatigue and fever.  HENT:  Negative for congestion.   Eyes:  Negative for pain.  Respiratory:  Negative for cough and shortness of breath.   Cardiovascular:  Negative for chest pain, palpitations and leg swelling.  Gastrointestinal:  Negative for abdominal pain.  Genitourinary:  Negative for dysuria and vaginal bleeding.  Musculoskeletal:  Negative for back pain.  Neurological:  Negative for syncope, light-headedness and headaches.  Psychiatric/Behavioral:  Negative for dysphoric mood.    Objective:  BP 120/70   Pulse 73   Temp (!) 97.5 F (36.4 C) (Temporal)   Ht 5\' 2"  (1.575 m)   Wt 156 lb (70.8 kg)   SpO2 95%   BMI 28.53 kg/m   Wt Readings from Last 3 Encounters:  04/17/24 156 lb (70.8 kg)  01/30/24 154 lb 4.8 oz (70 kg)  10/18/23 161 lb 2 oz (73.1 kg)      Physical Exam Constitutional:      General: She is not in acute distress.     Appearance: Normal appearance. She is well-developed. She is not ill-appearing or toxic-appearing.  HENT:     Head: Normocephalic.     Right Ear: Hearing, tympanic membrane, ear canal and external ear normal. Tympanic membrane is not erythematous, retracted or bulging.     Left Ear: Hearing, tympanic membrane, ear canal and external ear normal. Tympanic membrane is not erythematous, retracted or bulging.     Nose: No mucosal edema or rhinorrhea.     Right Sinus: No maxillary sinus tenderness or frontal sinus tenderness.     Left Sinus: No maxillary sinus tenderness or frontal sinus tenderness.     Mouth/Throat:     Pharynx: Uvula midline.  Eyes:     General: Lids are normal. Lids are everted, no foreign bodies appreciated.     Conjunctiva/sclera: Conjunctivae normal.     Pupils: Pupils are equal, round, and reactive to light.  Neck:     Thyroid : No thyroid  mass or thyromegaly.     Vascular: No carotid bruit.     Trachea: Trachea normal.  Cardiovascular:     Rate and Rhythm: Normal rate and regular rhythm.     Pulses: Normal pulses.     Heart sounds: Normal heart sounds, S1 normal and S2 normal. No murmur heard.    No friction rub. No gallop.  Pulmonary:     Effort: Pulmonary effort is normal. No tachypnea or respiratory distress.     Breath sounds: Normal breath sounds. No decreased breath sounds, wheezing, rhonchi or rales.  Abdominal:     General: Bowel sounds are normal.     Palpations: Abdomen is soft.     Tenderness: There is no abdominal tenderness.  Musculoskeletal:     Cervical back: Normal range of motion and neck supple.  Skin:    General: Skin is warm and dry.     Findings: No rash.  Neurological:     Mental Status: She is alert.  Psychiatric:        Mood and Affect: Mood is not anxious or depressed.        Speech: Speech normal.        Behavior: Behavior normal. Behavior is cooperative.        Thought Content: Thought content normal.        Judgment: Judgment  normal.     Diabetic foot exam: Normal inspection No skin breakdown  Bilateral pre-ulcerative calluses , hammer toes Normal DP pulses Normal sensation to light touch and monofilament Nails thickened.     Results for orders placed or performed in visit on 04/17/24  HM DIABETES FOOT EXAM   Collection Time: 04/17/24 12:00 AM  Result Value Ref Range   HM Diabetic Foot Exam done      COVID 19 screen:  No recent travel or known exposure to COVID19 The patient denies respiratory symptoms of COVID 19  at this time. The importance of social distancing was discussed today.   Assessment and Plan Body mass index is 28.53 kg/m.   The patient's preventative maintenance and recommended screening tests for an annual wellness exam were reviewed in full today. Brought up to date unless services declined.  Counselled on the importance of diet, exercise, and its role in overall health and mortality. The patient's FH and SH was reviewed, including their home life, tobacco status, and drug and alcohol status.   Vaccines:   Uptodate except, considering shingles/Td . .. Mammo: Hx of breast cancer. Stable 10/2023 Bone density: 10/2022 stable osteopenia, re-eval in 2-5 years.   Colon: history of stage 4 colon cancer... Per GI Dr. Tova Fresh, colonoscopy 03/2018     Following CEA every CEA PAP/DVE: DVE, pap not indicated, no symptoms. No vaginal bleeding Onc following thickening in uterus 07/2019... endometrial biopy.. negative. No vaginal bleeding. Hep C completed  Nonsmoker.  Reviewed advanced directives.  Problem List Items Addressed This Visit     Class 1 obesity due to excess calories with serious comorbidity and body mass index (BMI) of 31.0 to 31.9 in adult   Encouraged exercise, weight loss, healthy eating habits.       Controlled diabetes mellitus type 2 with complications (HCC) - Primary   Associated with HTN and past TIA Well controlled with diet.      Endometrial thickening on  ultrasound   2020 bx negative.      History of colon cancer   Planning on removal of port... this has been delayed. Needs CEA every 6 months...  thought she was no longer followed by oncology but appears she will have appt in 07/2024... she will call ONC to clarify plan.      History of ductal carcinoma in situ (DCIS) of breast    In remission, yearly mammograms      Hyperlipidemia associated with type 2 diabetes mellitus (HCC) (Chronic)   Stable, chronic.  Continue current medication.  Simvastatin  40 mg p.o. daily       Hypertension associated with diabetes (HCC) (Chronic)   Stable, chronic.  Continue current medication.  Losartan  50 mg p.o. daily      Osteopenia (Chronic)   Stable Recommend weight bearing exercise, calcium in diet and vit D supplement 400 IU 1-2 times daily. Repeat due in 2028      Vitamin D  deficiency (Chronic)   Chronic, resolved on supplement         Orders Placed This Encounter  Procedures   HM DIABETES FOOT EXAM    This external order was created through the Results Console.    Herby Lolling, MD

## 2024-05-04 ENCOUNTER — Ambulatory Visit
Admission: EM | Admit: 2024-05-04 | Discharge: 2024-05-04 | Disposition: A | Discharge status: Home / Self Care | Attending: Emergency Medicine | Admitting: Emergency Medicine

## 2024-05-04 ENCOUNTER — Ambulatory Visit: Payer: Self-pay

## 2024-05-04 DIAGNOSIS — R319 Hematuria, unspecified: Secondary | ICD-10-CM | POA: Diagnosis not present

## 2024-05-04 DIAGNOSIS — N390 Urinary tract infection, site not specified: Secondary | ICD-10-CM | POA: Diagnosis not present

## 2024-05-04 LAB — POCT URINALYSIS DIP (MANUAL ENTRY)
Glucose, UA: 100 mg/dL — AB
Nitrite, UA: POSITIVE — AB
Protein Ur, POC: >=300 mg/dL — AB
Spec Grav, UA: 1.02
Urobilinogen, UA: 2 U/dL — AB
pH, UA: 5

## 2024-05-04 MED ORDER — CEPHALEXIN 500 MG PO CAPS: 500.0000 mg | ORAL_CAPSULE | Freq: Three times a day (TID) | ORAL | 0 refills | Status: AC

## 2024-05-04 NOTE — ED Provider Notes (Signed)
 Susan Duffy    CSN: [id number] Arrival date & time: 05/04/24  1213      History   Chief Complaint Chief Complaint  Patient presents with   Urinary Frequency    HPI Susan Duffy is a 78 y.o. female.  Patient presents with 3-day history of dysuria, urinary frequency, urinary urgency.  She has been taking Azo.  No fever, abdominal pain, hematuria, flank pain.  The history is provided by the patient and medical records.    Past Medical History:  Diagnosis Date   Allergy    Breast CA (HCC)    Breast cancer (HCC) 2010   in situ and took radiation, no chemo needed   Colon cancer (HCC)    stage 4 colon cancer   History of chemotherapy 02/2011-08/2011   FOLFOX/AVASTIN- 8 CYCLES   History of DVT (deep vein thrombosis) 2010   while taking tamoxifen-left lower extremity   History of peripheral neuropathy    Hyperlipidemia    Hypertension    IDA (iron deficiency anemia)    Osteoarthritis    Personal history of chemotherapy    colon ca   Personal history of radiation therapy    breast and colon   TIA (transient ischemic attack) 03/08/2011   Vitamin D  deficiency     Patient Active Problem List   Diagnosis Date Noted   Endometrial thickening on ultrasound 08/01/2019   History of colon cancer 08/02/2016   Vitamin D  deficiency 04/17/2013   Osteopenia 02/15/2013   Dyspnea on exertion 12/16/2011   History of TIA (transient ischemic attack) 03/08/2011   Hereditary and idiopathic peripheral neuropathy 11/06/2010   PHLEBITIS&THROMBOPHLEB SUP VESSELS LOWER EXTREM 11/05/2009   Controlled diabetes mellitus type 2 with complications (HCC) 04/22/2009   History of ductal carcinoma in situ (DCIS) of breast 12/17/2008   Hypertension associated with diabetes (HCC) 10/17/2008   Hyperlipidemia associated with type 2 diabetes mellitus (HCC) 10/14/2008   Allergic rhinitis 09/05/2008   Osteoarthritis 09/05/2008   Class 1 obesity due to excess calories with serious comorbidity  and body mass index (BMI) of 31.0 to 31.9 in adult     Past Surgical History:  Procedure Laterality Date   BREAST EXCISIONAL BIOPSY Left 12/2008   s/p lumpectomy and radiation left dcis   BREAST EXCISIONAL BIOPSY Left    fibroadeoma reoved age 77   BREAST LUMPECTOMY  2010   left breast   broken ankle  Left 08/2012   COLON SURGERY     colectomy, partial   INTRACAPSULAR CATARACT EXTRACTION Bilateral    LOBECTOMY  06/2009   metastatic colon cancer    OB History   No obstetric history on file.      Home Medications    Prior to Admission medications   Medication Sig Start Date End Date Taking? Authorizing Provider  cephALEXin (KEFLEX) 500 MG capsule Take 1 capsule (500 mg total) by mouth 3 (three) times daily for 5 days. 05/04/24 05/09/24 Yes Wellington Half, NP  acetaminophen (TYLENOL) 500 MG tablet as needed for moderate pain. Take 1-2 by mouth every 6 hours as needed     [provider]  aspirin 81 MG chewable tablet Chew 81 mg by mouth daily.    [provider]  cholecalciferol (VITAMIN D ) 400 UNITS TABS tablet Take 400 Units by mouth 2 (two) times daily.    [provider]  Cyanocobalamin (VITAMIN B-12) 2000 MCG TBCR Take 1 tablet by mouth daily.    [provider]  losartan  (  COZAAR ) 50 MG tablet TAKE 1 TABLET BY MOUTH EVERY DAY 02/01/24   Bedsole, Amy E, MD  simvastatin  (ZOCOR ) 40 MG tablet TAKE 1 TABLET BY MOUTH EVERYDAY AT BEDTIME 07/04/23   Bedsole, Amy E, MD    Family History Family History  Problem Relation Age of Onset   Hypertension Mother    Stroke Mother    Coronary artery disease Mother    Thyroid  disease Mother    Diabetes Father    Hypertension Father    Lung cancer Father    Heart attack Maternal Grandfather    Breast cancer Maternal Grandmother    Thyroid  disease Brother    Diabetes Paternal Grandmother    Colon cancer Paternal Grandfather    Deep vein thrombosis Other     Social History Social History   Tobacco  Use   Smoking status: Never   Smokeless tobacco: Never  Vaping Use   Vaping status: Never Used  Substance Use Topics   Alcohol use: No   Drug use: No     Allergies   Amoxicillin, Erythromycin, and Nitrofuran derivatives   Review of Systems Review of Systems  Constitutional:  Negative for chills and fever.  Gastrointestinal:  Negative for abdominal pain.  Genitourinary:  Positive for dysuria, frequency and urgency. Negative for flank pain and hematuria.     Physical Exam Triage Vital Signs ED Triage Vitals  Encounter Vitals Group     BP 05/04/24 1225 124/81     Systolic BP Percentile --      Diastolic BP Percentile --      Pulse Rate 05/04/24 1225 80     Resp 05/04/24 1225 18     Temp 05/04/24 1225 (!) 97.5 F (36.4 C)     Temp Source 05/04/24 1225 Axillary     SpO2 05/04/24 1225 97 %     Weight --      Height --      Head Circumference --      Peak Flow --      Pain Score 05/04/24 1222 2     Pain Loc --      Pain Education --      Exclude from Growth Chart --    No data found.  Updated Vital Signs BP 124/81   Pulse 80   Temp (!) 97.5 F (36.4 C) (Axillary)   Resp 18   SpO2 97%   Visual Acuity Right Eye Distance:   Left Eye Distance:   Bilateral Distance:    Right Eye Near:   Left Eye Near:    Bilateral Near:     Physical Exam Constitutional:      General: She is not in acute distress. HENT:     Mouth/Throat:     Mouth: Mucous membranes are moist.  Cardiovascular:     Rate and Rhythm: Normal rate and regular rhythm.     Heart sounds: Normal heart sounds.  Pulmonary:     Effort: Pulmonary effort is normal. No respiratory distress.     Breath sounds: Normal breath sounds.  Abdominal:     General: Bowel sounds are normal.     Palpations: Abdomen is soft.     Tenderness: There is no abdominal tenderness. There is no right CVA tenderness, left CVA tenderness, guarding or rebound.  Neurological:     Mental Status: She is alert.      UC  Treatments / Results  Labs (all labs ordered are listed, but only abnormal results are displayed) Labs  Reviewed  POCT URINALYSIS DIP (MANUAL ENTRY) - Abnormal; Notable for the following components:      Result Value   Color, UA orange (*)    Clarity, UA cloudy (*)    Glucose, UA =100 (*)    Bilirubin, UA moderate (*)    Ketones, POC UA small (15) (*)    Blood, UA large (*)    Protein Ur, POC >=300 (*)    Urobilinogen, UA 2.0 (*)    Nitrite, UA Positive (*)    Leukocytes, UA Large (3+) (*)    All other components within normal limits  URINE CULTURE    EKG   Radiology No results found.  Procedures Procedures (including critical care time)  Medications Ordered in UC Medications - No data to display  Initial Impression / Assessment and Plan / UC Course  I have reviewed the triage vital signs and the nursing notes.  Pertinent labs & imaging results that were available during my care of the patient were reviewed by me and considered in my medical decision making (see chart for details).   Urinary tract infection with hematuria.  GFR 54 on 04/10/2024.  Cautioned patient to avoid Azo due to her age and renal function.  Treating with Keflex. Urine culture pending. Discussed with patient that we will call her if the urine culture shows the need to change or discontinue the antibiotic. Instructed her to follow-up with her PCP if her symptoms are not improving. Patient agrees to plan of care.     Final Clinical Impressions(s) / UC Diagnoses   Final diagnoses:  Urinary tract infection with hematuria, site unspecified     Discharge Instructions      Take the antibiotic as directed.  The urine culture is pending.  We will call you if it shows the need to change or discontinue your antibiotic.    Follow up with your primary care provider on Monday.      ED Prescriptions     Medication Sig Dispense Auth. Provider   cephALEXin (KEFLEX) 500 MG capsule Take 1 capsule (500 mg  total) by mouth 3 (three) times daily for 5 days. 15 capsule Wellington Half, NP      PDMP not reviewed this encounter.   Wellington Half, NP 05/04/24 [phone]

## 2024-05-04 NOTE — Telephone Encounter (Signed)
 Chief Complaint: urinary sx Symptoms: frequency, urgency, burning Frequency: increased Pertinent Negatives: Patient denies pain, fever, blood Disposition: [] ED /[x] Urgent Care (no appt availability in office) / [] Appointment(In office/virtual)/ []  [facility]/ [] Home Care/ [] Refused Recommended Disposition /[] [facility]/ []  Follow-up with PCP Additional Notes:  Several days of uti symptoms. Used otc medication without effect. Frequency, urgency, and burning on urination. Denies pain other than burning on urination. Acute evaluation advised, none are available at practice location until Monday, patient will proceed to urgent care for evaluation and treatment. Educated on care advice as documented in protocol, patient verbalized understanding. Discussed reasons to call back.      Copied from CRM [id number]. Topic: Clinical - Red Word Triage >> May 04, 2024 11:45 AM Howard Macho wrote: Reason for EAV:[id number] UTI pain, burning and urgency Reason for Disposition  Age > 50 years  Protocols used: Urination Pain - Female-A-AH

## 2024-05-04 NOTE — Discharge Instructions (Signed)
Take the antibiotic as directed.  The urine culture is pending.  We will call you if it shows the need to change or discontinue your antibiotic.    Follow up with your primary care provider on Monday.

## 2024-05-04 NOTE — ED Triage Notes (Signed)
 Patient to Urgent Care with complaints of urinary urgency and frequency/ dysuria.  Symptoms x3 days.   Taking azo.

## 2024-05-06 LAB — URINE CULTURE: Culture: 70000 — AB

## 2024-05-07 ENCOUNTER — Ambulatory Visit: Payer: Self-pay | Admitting: Family Medicine

## 2024-05-18 ENCOUNTER — Ambulatory Visit (INDEPENDENT_AMBULATORY_CARE_PROVIDER_SITE_OTHER)

## 2024-05-18 VITALS — Ht 62.0 in | Wt 155.0 lb

## 2024-05-18 DIAGNOSIS — Z0000 Encounter for general adult medical examination without abnormal findings: Secondary | ICD-10-CM | POA: Diagnosis not present

## 2024-05-18 NOTE — Progress Notes (Signed)
 Subjective:   Susan Duffy is a 78 y.o. who presents for a Medicare Wellness preventive visit.  As a reminder, Annual Wellness Visits don't include a physical exam, and some assessments may be limited, especially if this visit is performed virtually. We may recommend an in-person follow-up visit with your provider if needed.  Visit Complete: Virtual I connected with  Susan Duffy on 05/18/24 by a audio enabled telemedicine application and verified that I am speaking with the correct person using two identifiers.  Patient Location: Home  Provider Location: Office/Clinic  I discussed the limitations of evaluation and management by telemedicine. The patient expressed understanding and agreed to proceed.  Vital Signs: Because this visit was a virtual/telehealth visit, some criteria may be missing or patient reported. Any vitals not documented were not able to be obtained and vitals that have been documented are patient reported.  VideoDeclined- This patient declined Librarian, academic. Therefore the visit was completed with audio only.  Persons Participating in Visit: Patient.  AWV Questionnaire: Yes: Patient Medicare AWV questionnaire was completed by the patient on 05/18/24; I have confirmed that all information answered by patient is correct and no changes since this date.  Cardiac Risk Factors include: diabetes mellitus;advanced age (>83men, >39 women);dyslipidemia;hypertension     Objective:    Today's Vitals   05/18/24 0126 05/18/24 0939  Weight:  155 lb (70.3 kg)  Height:  5' 2 (1.575 m)  PainSc: 0-No pain    Body mass index is 28.35 kg/m.     05/04/2024   12:22 PM 01/28/2023   10:46 AM 07/28/2022   10:29 AM 03/30/2022   10:03 AM 01/27/2022   10:36 AM 10/21/2021    1:00 PM 07/29/2021   10:53 AM  Advanced Directives  Does Patient Have a Medical Advance Directive? No No Yes Yes Yes No No  Type of Advance Directive   Living  will;Healthcare Power of [employer] Power of [employer] Power of [place];Living will    Copy of Healthcare Power of Attorney in Chart?    No - copy requested     Would patient like information on creating a medical advance directive?  No - Patient declined    No - Patient declined No - Patient declined    Current Medications (verified) Outpatient Encounter Medications as of 05/18/2024  Medication Sig   acetaminophen (TYLENOL) 500 MG tablet as needed for moderate pain. Take 1-2 by mouth every 6 hours as needed    aspirin 81 MG chewable tablet Chew 81 mg by mouth daily.   cholecalciferol (VITAMIN D ) 400 UNITS TABS tablet Take 400 Units by mouth 2 (two) times daily.   Cyanocobalamin (VITAMIN B-12) 2000 MCG TBCR Take 1 tablet by mouth daily.   losartan  (COZAAR ) 50 MG tablet TAKE 1 TABLET BY MOUTH EVERY DAY   simvastatin  (ZOCOR ) 40 MG tablet TAKE 1 TABLET BY MOUTH EVERYDAY AT BEDTIME   Facility-Administered Encounter Medications as of 05/18/2024  Medication   sodium chloride  flush (NS) 0.9 % injection 10 mL    Allergies (verified) Amoxicillin, Erythromycin, and Nitrofuran derivatives   History: Past Medical History:  Diagnosis Date   Allergy    Breast CA (HCC)    Breast cancer (HCC) 2010   in situ and took radiation, no chemo needed   Colon cancer (HCC)    stage 4 colon cancer   History of chemotherapy 02/2011-08/2011   FOLFOX/AVASTIN- 8 CYCLES   History of DVT (deep vein thrombosis) 2010  while taking tamoxifen-left lower extremity   History of peripheral neuropathy    Hyperlipidemia    Hypertension    IDA (iron deficiency anemia)    Neuromuscular disorder (HCC)    Osteoarthritis    Personal history of chemotherapy    colon ca   Personal history of radiation therapy    breast and colon   TIA (transient ischemic attack) 03/08/2011   Vitamin D  deficiency    Past Surgical History:  Procedure Laterality Date   BREAST EXCISIONAL BIOPSY Left 12/2008   s/p  lumpectomy and radiation left dcis   BREAST EXCISIONAL BIOPSY Left    fibroadeoma reoved age 1   BREAST LUMPECTOMY  2010   left breast   broken ankle  Left 08/2012   COLON SURGERY     colectomy, partial   EYE SURGERY     FRACTURE SURGERY     INTRACAPSULAR CATARACT EXTRACTION Bilateral    LOBECTOMY  06/2009   metastatic colon cancer   Family History  Problem Relation Age of Onset   Hypertension Mother    Stroke Mother    Coronary artery disease Mother    Thyroid  disease Mother    Diabetes Father    Hypertension Father    Lung cancer Father    Heart attack Maternal Grandfather    Breast cancer Maternal Grandmother    Thyroid  disease Brother    Diabetes Paternal Grandmother    Colon cancer Paternal Grandfather    Deep vein thrombosis Other    Social History   Socioeconomic History   Marital status: Single    Spouse name: Not on file   Number of children: Not on file   Years of education: Not on file   Highest education level: Bachelor's degree (e.g., BA, AB, BS)  Occupational History   Occupation: Runner, broadcasting/film/video (retired)    Associate Professor: RETIRED    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   Patient eats fruits and veggies, avoids salt and sugar, no fried food    Has living will, full code, HCPOA Susan Duffy, son (reviewed 2014)   Social Drivers of Health   Financial Resource Strain: Low Risk  (05/18/2024)   Overall Financial Resource Strain (CARDIA)    Difficulty of Paying Living Expenses: Not hard at all  Food Insecurity: No Food Insecurity (05/18/2024)   Hunger Vital Sign    Worried About Running Out of Food in the Last Year: Never true    Ran Out of Food in the Last  Year: Never true  Transportation Needs: No Transportation Needs (05/18/2024)   PRAPARE - Administrator, Civil Service (Medical): No    Lack of Transportation (Non-Medical): No  Physical Activity: Sufficiently Active (05/18/2024)   Exercise Vital Sign    Days of Exercise per Week: 6 days    Minutes of Exercise per Session: 30 min  Stress: No Stress Concern Present (05/18/2024)   [employer] of Occupational Health - Occupational Stress Questionnaire    Feeling of Stress: Not at all  Social Connections: Socially Isolated (05/18/2024)   Social Connection and Isolation Panel    Frequency of Communication with  Friends and Family: Twice a week    Frequency of Social Gatherings with Friends and Family: Once a week    Attends Religious Services: Never    Database administrator or Organizations: No    Attends Engineer, structural: Not on file    Marital Status: Widowed    Tobacco Counseling Counseling given: Not Answered    Clinical Intake:  Pre-visit preparation completed: Yes  Pain : No/denies pain Pain Score: 0-No pain    BMI - recorded: 28.35 Nutritional Status: BMI 25 -29 Overweight Nutritional Risks: None Diabetes: Yes CBG done?: No Did pt. bring in CBG monitor from home?: No  Lab Results  Component Value Date   HGBA1C 5.8 04/10/2024   HGBA1C 5.6 10/18/2023   HGBA1C 5.8 04/07/2023     How often do you need to have someone help you when you read instructions, pamphlets, or other written materials from your doctor or pharmacy?: 1 - Never  Interpreter Needed?: No  Comments: son lives with pt Information entered by :: B.Oscar Forman,LPN   Activities of Daily Living     05/18/2024    1:26 AM  In your present state of health, do you have any difficulty performing the following activities:  Hearing? 0  Vision? 0  Difficulty concentrating or making decisions? 0  Walking or climbing stairs? 0  Dressing or bathing? 0  Doing errands, shopping? 0   Preparing Food and eating ? N  Using the Toilet? N  In the past six months, have you accidently leaked urine? N  Do you have problems with loss of bowel control? N  Managing your Medications? N  Managing your Finances? N  Housekeeping or managing your Housekeeping? N    Patient Care Team: Judithann Novas, MD as PCP - General Rochell Chroman, RN as Oncology Nurse Navigator Gwyn Leos, MD as Consulting Physician (Oncology) [facility]  I have updated your Care Teams any recent Medical Services you may have received from other providers in the past year.     Assessment:   This is a routine wellness examination for [place].  Hearing/Vision screen Hearing Screening - Comments:: Pt says her hearing is very good Vision Screening - Comments:: Pt says her vision is good after eye surgery both eyes;readers only [facility]    Goals Addressed             This Visit's Progress    DIET - REDUCE SUGAR INTAKE   On track    05/18/24.Aaron Aascontinues to do this     Increase physical activity   On track    05/18/24- I will continue to walk for 30 minutes 3 days per week.      Patient Stated   On track    05/18/24-Continue Current lifestyle        Depression Screen     05/18/2024    9:49 AM 04/17/2024    9:45 AM 04/14/2023    8:58 AM 03/30/2022   10:09 AM 12/09/2020   11:04 AM 11/19/2019   10:02 AM 11/14/2018   10:46 AM  PHQ 2/9 Scores  PHQ - 2 Score 0 0 0 0 0 0 0  PHQ- 9 Score      0 0    Fall Risk     05/18/2024    1:26 AM 04/17/2024    9:45 AM 04/14/2023    8:57 AM 04/13/2022   10:08 AM 03/30/2022   10:06 AM  Fall Risk   Falls in  the past year? 0 0 0 0 0  Number falls in past yr:  0 0  0  Injury with Fall?  0 0  0  Risk for fall due to : No Fall Risks Impaired mobility Impaired mobility    Follow up Falls prevention discussed;Education provided Falls evaluation completed Falls evaluation completed Falls evaluation completed  Falls evaluation completed;Education  provided;Falls prevention discussed      Data saved with a previous flowsheet row definition    MEDICARE RISK AT HOME:  Medicare Risk at Home Any stairs in or around the home?: (Patient-Rptd) No Home free of loose throw rugs in walkways, pet beds, electrical cords, etc?: (Patient-Rptd) Yes Adequate lighting in your home to reduce risk of falls?: (Patient-Rptd) Yes Life alert?: (Patient-Rptd) No Use of a cane, walker or w/c?: (Patient-Rptd) Yes Grab bars in the bathroom?: (Patient-Rptd) No Shower chair or bench in shower?: (Patient-Rptd) Yes Elevated toilet seat or a handicapped toilet?: (Patient-Rptd) No  TIMED UP AND GO:  Was the test performed?  No  Cognitive Function: 6CIT completed    11/19/2019   10:03 AM 11/14/2018   10:46 AM  MMSE - Mini Mental State Exam  Orientation to time 5 5  Orientation to Place 5 5  Registration 3 3  Attention/ Calculation 5 0  Recall 3 3  Language- name 2 objects  0  Language- repeat 1 1  Language- follow 3 step command  3  Language- read & follow direction  0  Write a sentence  0  Copy design  0  Total score  20        05/18/2024    9:47 AM 03/30/2022   10:06 AM  6CIT Screen  What Year? 0 points 0 points  What month? 0 points 0 points  What time? 0 points 0 points  Count back from 20 0 points 0 points  Months in reverse 0 points 0 points  Repeat phrase 0 points 0 points  Total Score 0 points 0 points    Immunizations Immunization History  Administered Date(s) Administered   Fluad Quad(high Dose 65+) 09/28/2023   Influenza Split 09/08/2011   Influenza Whole 09/12/2008, 09/03/2010, 08/23/2012   Influenza, High Dose Seasonal PF 08/31/2013, 09/04/2014, 09/10/2015, 09/10/2016, 09/06/2017, 09/04/2018, 08/28/2019, 08/29/2020, 08/27/2021   PFIZER Comirnaty(Gray Top)Covid-19 Tri-Sucrose Vaccine 01/27/2021   PFIZER(Purple Top)SARS-COV-2 Vaccination 04/11/2020, 05/06/2020   Pneumococcal Conjugate-13 10/22/2014   Pneumococcal  Polysaccharide-23 11/06/2010   Td 10/17/2008    Screening Tests Health Maintenance  Topic Date Due   Zoster Vaccines- Shingrix (1 of 2) Never done   DTaP/Tdap/Td (2 - Tdap) 10/17/2018   COVID-19 Vaccine (4 - 2024-25 season) 08/07/2023   OPHTHALMOLOGY EXAM  01/26/2024   INFLUENZA VACCINE  07/06/2024   HEMOGLOBIN A1C  10/11/2024   MAMMOGRAM  10/19/2024   Diabetic kidney evaluation - eGFR measurement  04/10/2025   Diabetic kidney evaluation - Urine ACR  04/10/2025   FOOT EXAM  04/17/2025   Medicare Annual Wellness (AWV)  05/18/2025   DEXA SCAN  10/19/2027   Pneumococcal Vaccine: 50+ Years  Completed   HPV VACCINES  Aged Out   Meningococcal B Vaccine  Aged Out   Colonoscopy  Discontinued   Hepatitis C Screening  Discontinued    Health Maintenance  Health Maintenance Due  Topic Date Due   Zoster Vaccines- Shingrix (1 of 2) Never done   DTaP/Tdap/Td (2 - Tdap) 10/17/2018   COVID-19 Vaccine (4 - 2024-25 season) 08/07/2023   OPHTHALMOLOGY EXAM  01/26/2024  Health Maintenance Items Addressed: None needed at this time  Additional Screening:  Vision Screening: Recommended annual ophthalmology exams for early detection of glaucoma and other disorders of the eye. Would you like a referral to an eye doctor? No    Dental Screening: Recommended annual dental exams for proper oral hygiene  Community Resource Referral / Chronic Care Management: CRR required this visit?  No   CCM required this visit?  No   Plan:    I have personally reviewed and noted the following in the patient's chart:   Medical and social history Use of alcohol, tobacco or illicit drugs  Current medications and supplements including opioid prescriptions. Patient is not currently taking opioid prescriptions. Functional ability and status Nutritional status Physical activity Advanced directives List of other physicians Hospitalizations, surgeries, and ER visits in previous 12 months Vitals Screenings  to include cognitive, depression, and falls Referrals and appointments  In addition, I have reviewed and discussed with patient certain preventive protocols, quality metrics, and best practice recommendations. A written personalized care plan for preventive services as well as general preventive health recommendations were provided to patient.   Nerissa Bannister, LPN   0/09/2724   After Visit Summary: (MyChart) Due to this being a telephonic visit, the after visit summary with patients personalized plan was offered to patient via MyChart   Notes: Nothing significant to report at this time.

## 2024-05-18 NOTE — Patient Instructions (Signed)
 Susan Duffy , Thank you for taking time out of your busy schedule to complete your Annual Wellness Visit with me. I enjoyed our conversation and look forward to speaking with you again next year. I, as well as your care team,  appreciate your ongoing commitment to your health goals. Please review the following plan we discussed and let me know if I can assist you in the future. Your Game plan/ To Do List    Follow up Visits: Next Medicare AWV with our clinical staff: 05/22/25 @ 9:30am televisit   Have you seen your provider in the last 6 months (3 months if uncontrolled diabetes)? Yes Next Office Visit with your provider: 04/19/2025  Clinician Recommendations:  Aim for 30 minutes of exercise or brisk walking, 6-8 glasses of water, and 5 servings of fruits and vegetables each day.       This is a list of the screening recommended for you and due dates:  Health Maintenance  Topic Date Due   Zoster (Shingles) Vaccine (1 of 2) Never done   DTaP/Tdap/Td vaccine (2 - Tdap) 10/17/2018   COVID-19 Vaccine (4 - 2024-25 season) 08/07/2023   Eye exam for diabetics  01/26/2024   Flu Shot  07/06/2024   Hemoglobin A1C  10/11/2024   Mammogram  10/19/2024   Yearly kidney function blood test for diabetes  04/10/2025   Yearly kidney health urinalysis for diabetes  04/10/2025   Complete foot exam   04/17/2025   Medicare Annual Wellness Visit  05/18/2025   DEXA scan (bone density measurement)  10/19/2027   Pneumococcal Vaccine for age over 60  Completed   HPV Vaccine  Aged Out   Meningitis B Vaccine  Aged Out   Colon Cancer Screening  Discontinued   Hepatitis C Screening  Discontinued    Advanced directives: (Copy Requested) Please bring a copy of your health care power of attorney and living will to the office to be added to your chart at your convenience. You can mail to [facility] [address] [place], [place] [address] or email to [email or web address] Advance Care  Planning is important because it:  [x]  Makes sure you receive the medical care that is consistent with your values, goals, and preferences  [x]  It provides guidance to your family and loved ones and reduces their decisional burden about whether or not they are making the right decisions based on your wishes.  Follow the link provided in your after visit summary or read over the paperwork we have mailed to you to help you started getting your Advance Directives in place. If you need assistance in completing these, please reach out to us  so that we can help you!

## 2024-05-21 ENCOUNTER — Telehealth: Payer: Self-pay | Admitting: *Deleted

## 2024-05-21 NOTE — Telephone Encounter (Signed)
 Patient has a pimple right side of the port and also she says it sensitive and itchy she wants somebody to look at.  Also she was going to get her port out but she was going [place] to another [place] and did not want to deal with all that stuff.  He is ready for it to be taken out and it can be done by IR here in [place] and the patient is great for that.  Ardie Beckmann is sending a message to [facility] and IR to get a spot for the patient to get the needle .  Told her that she will be getting a call when we have it set up.  She has an appointment with Lauren tomorrow 10 AM And the patient knows

## 2024-05-22 ENCOUNTER — Encounter: Payer: Self-pay | Admitting: Nurse Practitioner

## 2024-05-22 ENCOUNTER — Inpatient Hospital Stay: Attending: Nurse Practitioner | Admitting: Nurse Practitioner

## 2024-05-22 VITALS — BP 139/80 | HR 77 | Temp 98.3°F | Resp 16 | Ht 62.0 in | Wt 157.0 lb

## 2024-05-22 DIAGNOSIS — T4995XA Adverse effect of unspecified topical agent, initial encounter: Secondary | ICD-10-CM | POA: Diagnosis not present

## 2024-05-22 DIAGNOSIS — L089 Local infection of the skin and subcutaneous tissue, unspecified: Secondary | ICD-10-CM | POA: Insufficient documentation

## 2024-05-22 DIAGNOSIS — Z86711 Personal history of pulmonary embolism: Secondary | ICD-10-CM | POA: Diagnosis not present

## 2024-05-22 DIAGNOSIS — Z95828 Presence of other vascular implants and grafts: Secondary | ICD-10-CM | POA: Diagnosis not present

## 2024-05-22 DIAGNOSIS — N281 Cyst of kidney, acquired: Secondary | ICD-10-CM | POA: Insufficient documentation

## 2024-05-22 DIAGNOSIS — Z85038 Personal history of other malignant neoplasm of large intestine: Secondary | ICD-10-CM | POA: Insufficient documentation

## 2024-05-22 DIAGNOSIS — Z86000 Personal history of in-situ neoplasm of breast: Secondary | ICD-10-CM | POA: Diagnosis not present

## 2024-05-22 DIAGNOSIS — R238 Other skin changes: Secondary | ICD-10-CM

## 2024-05-22 DIAGNOSIS — G629 Polyneuropathy, unspecified: Secondary | ICD-10-CM | POA: Diagnosis not present

## 2024-05-22 DIAGNOSIS — Z9221 Personal history of antineoplastic chemotherapy: Secondary | ICD-10-CM | POA: Diagnosis not present

## 2024-05-22 MED ORDER — DOXYCYCLINE HYCLATE 100 MG PO TABS: 100.0000 mg | ORAL_TABLET | Freq: Two times a day (BID) | ORAL | 0 refills | Status: AC

## 2024-05-22 NOTE — Progress Notes (Signed)
 Order for port a cath removal entered for [facility] location

## 2024-05-22 NOTE — Progress Notes (Signed)
 Symptom Management Clinic  [facility] A Department of the [place]. [facility] [address], [address] [place], [place] [address] [phone] (phone) [phone] (fax)  Patient Care Team: Judithann Novas, MD as PCP - General Rochell Chroman, RN as Oncology Nurse Navigator Gwyn Leos, MD as Consulting Physician (Oncology) Pa, [facility]   Name of the patient: Susan Duffy  [id number]  [birth year]   Date of visit: 05/22/24  Diagnosis- History of Colon Cancer  Chief complaint/ Reason for visit- Skin Infection  Heme/Onc history:  Oncology History Overview Note  # 2005- COLON CA [Stage II- left sided; s/p Surgery in [facility] [place]]; no Adj chemo.    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    # Nov 2010-LLE DVT [on Tam] on coumadin ; AUG 2017- STOP coumadin   #August 2020-endometrial biopsy-Dr. Marella Shams negative for malignancy.  # ; PN-G-1; port flush q 6 w   # Left kidney cyst [Feb 2017]  # colonoscopy- 2019 [Dr.Mann; [place]]  DIAGNOSIS: METASTATIC COLON CA  STAGE: IV; NED        ;GOALS: control  CURRENT/MOST RECENT THERAPY: surveillance    History of colon cancer    Interval history- patient is 78 Female with above history metastatic colon cancer, currently on surveillance, who presents to symptom management clinic for concerns skin infection.  Over the past week she has noticed increased discomfort near her port and a small skin opening. She also noticed a lesion on her upper back that is reddened, increased tenderness. She is worried for infection. No fevers. She is planning to have port removed. She was treated for UTI 05/04/24 at [facility] with  cephalexin .   Review of systems- Review of Systems  Constitutional:  Negative for chills, fever, malaise/fatigue and weight loss.  Cardiovascular:  Negative for chest pain.  Skin:  Negative for itching and rash.    Allergies  Allergen Reactions   Amoxicillin Other (See Comments)    REACTION: Diarrhea   Erythromycin Nausea And Vomiting   Nitrofuran Derivatives Nausea And Vomiting   Past Medical History:  Diagnosis Date   Allergy    Breast CA (HCC)    Breast cancer (HCC) 2010   in situ and took radiation, no chemo needed   Colon cancer (HCC)    stage 4 colon cancer   History of chemotherapy 02/2011-08/2011   FOLFOX/AVASTIN- 8 CYCLES   History of DVT (deep vein thrombosis) 2010   while taking tamoxifen-left lower extremity   History of peripheral neuropathy    Hyperlipidemia    Hypertension    IDA (iron deficiency anemia)    Neuromuscular disorder (HCC)    Osteoarthritis    Personal history of chemotherapy    colon ca   Personal history of radiation therapy    breast and colon   TIA (transient ischemic attack) 03/08/2011   Vitamin D  deficiency    Past Surgical History:  Procedure Laterality Date   BREAST EXCISIONAL BIOPSY Left 12/2008   s/p lumpectomy and radiation left dcis   BREAST EXCISIONAL BIOPSY Left    fibroadeoma reoved age 34   BREAST LUMPECTOMY  2010   left breast   broken ankle  Left 08/2012  COLON SURGERY     colectomy, partial   EYE SURGERY     FRACTURE SURGERY     INTRACAPSULAR CATARACT EXTRACTION Bilateral    LOBECTOMY  06/2009   metastatic colon cancer   Social History   Socioeconomic History   Marital status: Single    Spouse name: Not on file   Number of children: Not on file   Years of education: Not on file   Highest education level: Bachelor's degree (e.g., BA, AB, BS)  Occupational History   Occupation: Runner, broadcasting/film/video (retired)    Associate Professor: RETIRED    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   Patient eats fruits and veggies, avoids salt and sugar, no fried food    Has living will, full code, HCPOA Susan Duffy, son (reviewed 2014)   Social Drivers of Health   Financial Resource Strain: Low Risk  (05/18/2024)   Overall Financial Resource Strain (CARDIA)    Difficulty of Paying Living Expenses: Not hard at all  Food Insecurity: No Food Insecurity (05/18/2024)   Hunger Vital Sign    Worried About Running Out of Food in the Last Year: Never true    Ran Out of Food in the Last Year: Never true  Transportation Needs: No Transportation Needs (05/18/2024)   PRAPARE - Administrator, Civil Service (Medical): No    Lack of Transportation (Non-Medical): No  Physical Activity: Sufficiently Active (05/18/2024)   Exercise Vital Sign    Days of Exercise per Week: 6 days    Minutes of Exercise per Session: 30 min  Stress: No Stress Concern Present (05/18/2024)   [employer] of Occupational Health - Occupational Stress Questionnaire    Feeling of Stress: Not at all  Social Connections: Socially Isolated (05/18/2024)   Social Connection and Isolation Panel    Frequency of Communication with Friends and Family: Twice a week    Frequency of Social Gatherings with Friends and Family: Once a week    Attends Religious Services: Never    Database administrator or Organizations: No    Attends Engineer, structural: Not on file    Marital Status: Widowed  Intimate Partner Violence: Not At Risk (05/18/2024)   Humiliation, Afraid, Rape, and Kick questionnaire    Fear of Current or Ex-Partner: No    Emotionally Abused: No    Physically Abused: No    Sexually Abused: No   Family History  Problem Relation Age of  Onset   Hypertension Mother    Stroke Mother    Coronary artery disease Mother    Thyroid  disease Mother    Diabetes Father    Hypertension Father    Lung cancer Father    Heart attack Maternal Grandfather    Breast cancer Maternal Grandmother    Thyroid  disease Brother    Diabetes Paternal Grandmother    Colon cancer Paternal Grandfather    Deep vein thrombosis Other    Current Outpatient Medications:    acetaminophen (TYLENOL) 500 MG tablet, as needed for moderate pain. Take 1-2 by mouth every 6 hours as needed , Disp: , Rfl:  aspirin 81 MG chewable tablet, Chew 81 mg by mouth daily., Disp: , Rfl:    cephALEXin  (KEFLEX ) 500 MG capsule, Take 500 mg by mouth 3 (three) times daily., Disp: , Rfl:    cholecalciferol (VITAMIN D ) 400 UNITS TABS tablet, Take 400 Units by mouth 2 (two) times daily., Disp: , Rfl:    Cyanocobalamin (VITAMIN B-12) 2000 MCG TBCR, Take 1 tablet by mouth daily., Disp: , Rfl:    losartan  (COZAAR ) 50 MG tablet, TAKE 1 TABLET BY MOUTH EVERY DAY, Disp: 90 tablet, Rfl: 1   simvastatin  (ZOCOR ) 40 MG tablet, TAKE 1 TABLET BY MOUTH EVERYDAY AT BEDTIME, Disp: 90 tablet, Rfl: 3 No current facility-administered medications for this visit.  Facility-Administered Medications Ordered in Other Visits:    sodium chloride  flush (NS) 0.9 % injection 10 mL, 10 mL, Intravenous, PRN, Brahmanday, Govinda R, MD, 10 mL at 01/05/16 1135  Physical exam:  Vitals:   05/22/24 1003  BP: 139/80  Pulse: 77  Resp: 16  Temp: 98.3 F (36.8 C)  TempSrc: Tympanic  SpO2: 97%  Weight: 157 lb (71.2 kg)  Height: 5' 2 (1.575 m)   Physical Exam Vitals reviewed.  Constitutional:      Appearance: She is not ill-appearing.   Skin:    Coloration: Skin is not pale.     Findings: Lesion present.         Comments: 1. pin point lesion at 5:00 to port a cath. Mild surrounding erythema. Dry skin surrounding port. No obvious infection. 2. Quarter sized lesion, raised, tender, warm. Slight  drainage.    Neurological:     Mental Status: She is alert and oriented to person, place, and time.         Latest Ref Rng & Units 04/10/2024    8:04 AM  CMP  Glucose 70 - 99 mg/dL 914   BUN 6 - 23 mg/dL 22   Creatinine 7.82 - 1.20 mg/dL 9.56   Sodium 213 - 086 mEq/L 140   Potassium 3.5 - 5.1 mEq/L 4.1   Chloride 96 - 112 mEq/L 104   CO2 19 - 32 mEq/L 28   Calcium 8.4 - 10.5 mg/dL 9.3   Total Protein 6.0 - 8.3 g/dL 7.9   Total Bilirubin 0.2 - 1.2 mg/dL 0.5   Alkaline Phos 39 - 117 U/L 83   AST 0 - 37 U/L 19   ALT 0 - 35 U/L 14       Latest Ref Rng & Units 01/30/2024   10:05 AM  CBC  WBC 4.0 - 10.5 K/uL 5.8   Hemoglobin 12.0 - 15.0 g/dL 57.8   Hematocrit 46.9 - 46.0 % 42.7   Platelets 150 - 400 K/uL 234    No results found.  Assessment and plan- Patient is a 78 y.o. female   Skin infection- take doxycycline 100 mg BID x 7 days. Can use warm compresses four times a day Port-a-cath- plan to refer to [facility] for removal.  Colon Cancer- metastatic. Currently on surveillance and has been released from [facility] surveillance. She will have CEA monitored by PCP every 6 months. If concerning symptoms we can see her back in the future.   Disposition:  Ref to [facility] for port removal Cancel August appts; no f/u needed- la    Visit Diagnosis 1. Skin irritation due to topical agent   2. Port-A-Cath in place    Patient expressed understanding and was in agreement with this plan. She also understands that She can  call clinic at any time with any questions, concerns, or complaints.   Thank you for allowing me to participate in the care of this very pleasant patient.   Kenney Peacemaker, DNP, AGNP-C, AOCNP [facility] [phone]

## 2024-05-22 NOTE — Progress Notes (Signed)
 Patient finished her Cephalexin  medication from having a UTI on 05/16/2024. She thinks that maybe that is the cause of the irritation at her port site. The next morning after taking the cephalexin  medication, her port area started off by feeling really hot, then having bumps around the area that come and they go. She also just noticed there was the same kind of bumps on the back of her neck. Not painful now but started off being painful with the heat feeling.

## 2024-05-29 ENCOUNTER — Ambulatory Visit
Admission: RE | Admit: 2024-05-29 | Discharge: 2024-05-29 | Disposition: A | Discharge status: Home / Self Care | Source: Ambulatory Visit | Attending: Nurse Practitioner

## 2024-05-29 DIAGNOSIS — Z95828 Presence of other vascular implants and grafts: Secondary | ICD-10-CM

## 2024-05-29 DIAGNOSIS — C3490 Malignant neoplasm of unspecified part of unspecified bronchus or lung: Secondary | ICD-10-CM | POA: Diagnosis not present

## 2024-05-29 DIAGNOSIS — Z452 Encounter for adjustment and management of vascular access device: Secondary | ICD-10-CM | POA: Diagnosis not present

## 2024-05-29 HISTORY — PX: IR REMOVAL TUN ACCESS W/ PORT W/O FL MOD SED: IMG2290

## 2024-05-29 MED ORDER — LIDOCAINE-EPINEPHRINE 1 %-1:100000 IJ SOLN
20.0000 mL | Freq: Once | INTRAMUSCULAR | Status: AC
  Administered 2024-05-29: 20 mL via INTRADERMAL

## 2024-05-29 NOTE — Procedures (Signed)
 Vascular and Interventional Radiology Procedure Note  Patient: Susan Duffy DOB: January 09, 1946 Medical Record Number: [id number] Note Date/Time: 05/29/24 9:48 AM   Performing Physician: Thom Hall, MD Assistant(s): None  Diagnosis: Lung cancer. Rx completion. L subclavian port, surgically placed in 08/12/2009  Procedure: PORT REMOVAL  Anesthesia: Local Anesthetic Complications: None Estimated Blood Loss: Minimal Specimens:  None  Findings:  Successful removal of a left-sided venous port. Primary incision closure. Dermabond at skin.  See detailed procedure note with images in PACS. The patient tolerated the procedure well without incident or complication and was returned to Recovery in stable condition.    Thom Hall, MD [facility] Inter[facility]iology [facility]   Pager. [phone] Clinic. [phone]

## 2024-06-22 ENCOUNTER — Other Ambulatory Visit: Payer: Self-pay | Admitting: Family Medicine

## 2024-07-26 ENCOUNTER — Other Ambulatory Visit: Payer: Self-pay | Admitting: Family Medicine

## 2024-07-30 ENCOUNTER — Ambulatory Visit: Payer: Medicare Other | Admitting: Internal Medicine

## 2024-07-30 ENCOUNTER — Other Ambulatory Visit: Payer: Medicare Other

## 2024-08-21 DIAGNOSIS — Z23 Encounter for immunization: Secondary | ICD-10-CM | POA: Diagnosis not present

## 2024-09-06 ENCOUNTER — Other Ambulatory Visit: Payer: Self-pay | Admitting: Family Medicine

## 2024-09-06 DIAGNOSIS — Z1231 Encounter for screening mammogram for malignant neoplasm of breast: Secondary | ICD-10-CM

## 2024-10-22 ENCOUNTER — Ambulatory Visit
Admission: RE | Admit: 2024-10-22 | Discharge: 2024-10-22 | Disposition: A | Discharge status: Home / Self Care | Source: Ambulatory Visit | Attending: Family Medicine | Admitting: Family Medicine

## 2024-10-22 DIAGNOSIS — Z1231 Encounter for screening mammogram for malignant neoplasm of breast: Secondary | ICD-10-CM | POA: Diagnosis not present

## 2024-10-23 ENCOUNTER — Ambulatory Visit: Payer: Self-pay | Admitting: Family Medicine

## 2025-04-12 ENCOUNTER — Other Ambulatory Visit

## 2025-04-19 ENCOUNTER — Encounter: Admitting: Family Medicine

## 2025-05-22 ENCOUNTER — Ambulatory Visit
